# Patient Record
Sex: Female | Born: 1945 | Race: White | Hispanic: No | Smoking: Former smoker
Health system: Southern US, Community
[De-identification: ages and names within clinical notes are randomized; demographics above are authoritative.]

## PROBLEM LIST (undated history)

## (undated) DIAGNOSIS — K219 Gastro-esophageal reflux disease without esophagitis: Secondary | ICD-10-CM

## (undated) DIAGNOSIS — Z828 Family history of other disabilities and chronic diseases leading to disablement, not elsewhere classified: Secondary | ICD-10-CM

## (undated) DIAGNOSIS — Z8719 Personal history of other diseases of the digestive system: Secondary | ICD-10-CM

## (undated) DIAGNOSIS — C3490 Malignant neoplasm of unspecified part of unspecified bronchus or lung: Secondary | ICD-10-CM

## (undated) DIAGNOSIS — C3491 Malignant neoplasm of unspecified part of right bronchus or lung: Secondary | ICD-10-CM

## (undated) DIAGNOSIS — N393 Stress incontinence (female) (male): Secondary | ICD-10-CM

## (undated) DIAGNOSIS — Z9981 Dependence on supplemental oxygen: Secondary | ICD-10-CM

## (undated) DIAGNOSIS — I10 Essential (primary) hypertension: Secondary | ICD-10-CM

## (undated) DIAGNOSIS — K635 Polyp of colon: Secondary | ICD-10-CM

## (undated) DIAGNOSIS — J189 Pneumonia, unspecified organism: Secondary | ICD-10-CM

## (undated) DIAGNOSIS — I739 Peripheral vascular disease, unspecified: Secondary | ICD-10-CM

## (undated) DIAGNOSIS — J449 Chronic obstructive pulmonary disease, unspecified: Secondary | ICD-10-CM

## (undated) DIAGNOSIS — E785 Hyperlipidemia, unspecified: Secondary | ICD-10-CM

## (undated) DIAGNOSIS — E119 Type 2 diabetes mellitus without complications: Secondary | ICD-10-CM

## (undated) DIAGNOSIS — M797 Fibromyalgia: Secondary | ICD-10-CM

## (undated) DIAGNOSIS — J439 Emphysema, unspecified: Secondary | ICD-10-CM

## (undated) DIAGNOSIS — Z720 Tobacco use: Secondary | ICD-10-CM

## (undated) DIAGNOSIS — I2510 Atherosclerotic heart disease of native coronary artery without angina pectoris: Secondary | ICD-10-CM

## (undated) DIAGNOSIS — J42 Unspecified chronic bronchitis: Secondary | ICD-10-CM

## (undated) DIAGNOSIS — IMO0002 Reserved for concepts with insufficient information to code with codable children: Secondary | ICD-10-CM

## (undated) HISTORY — PX: CARPAL TUNNEL RELEASE: SHX101

## (undated) HISTORY — PX: ROTATOR CUFF REPAIR: SHX139

## (undated) HISTORY — PX: FEMUR FRACTURE SURGERY: SHX633

## (undated) HISTORY — DX: Hyperlipidemia, unspecified: E78.5

## (undated) HISTORY — DX: Tobacco use: Z72.0

## (undated) HISTORY — DX: Malignant neoplasm of unspecified part of right bronchus or lung: C34.91

## (undated) HISTORY — DX: Polyp of colon: K63.5

## (undated) HISTORY — DX: Malignant neoplasm of unspecified part of unspecified bronchus or lung: C34.90

## (undated) HISTORY — PX: JOINT REPLACEMENT: SHX530

## (undated) HISTORY — PX: ABDOMINAL HYSTERECTOMY: SHX81

## (undated) HISTORY — PX: PNEUMONECTOMY: SHX168

## (undated) HISTORY — PX: EYE SURGERY: SHX253

## (undated) HISTORY — DX: Essential (primary) hypertension: I10

## (undated) HISTORY — DX: Gastro-esophageal reflux disease without esophagitis: K21.9

## (undated) HISTORY — DX: Atherosclerotic heart disease of native coronary artery without angina pectoris: I25.10

---
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
## 1968-10-06 HISTORY — PX: APPENDECTOMY: SHX54

## 1970-10-06 HISTORY — PX: ABDOMINAL HYSTERECTOMY: SUR658

## 1987-10-07 HISTORY — PX: GALLBLADDER SURGERY: SHX652

## 1999-10-07 HISTORY — PX: REPLACEMENT TOTAL KNEE: SUR1224

## 2001-01-25 ENCOUNTER — Ambulatory Visit (HOSPITAL_COMMUNITY): Admission: RE | Admit: 2001-01-25 | Discharge: 2001-01-25 | Payer: Self-pay | Admitting: Pediatrics

## 2001-01-25 ENCOUNTER — Encounter: Payer: Self-pay | Admitting: General Surgery

## 2001-05-07 ENCOUNTER — Ambulatory Visit (HOSPITAL_COMMUNITY): Admission: RE | Admit: 2001-05-07 | Discharge: 2001-05-07 | Payer: Self-pay | Admitting: Cardiovascular Disease

## 2001-06-10 ENCOUNTER — Encounter: Payer: Self-pay | Admitting: Specialist

## 2001-06-17 ENCOUNTER — Inpatient Hospital Stay (HOSPITAL_COMMUNITY): Admission: RE | Admit: 2001-06-17 | Discharge: 2001-06-21 | Payer: Self-pay | Admitting: Specialist

## 2001-07-08 ENCOUNTER — Encounter (HOSPITAL_COMMUNITY): Admission: RE | Admit: 2001-07-08 | Discharge: 2001-08-07 | Payer: Self-pay | Admitting: Specialist

## 2001-08-09 ENCOUNTER — Encounter (HOSPITAL_COMMUNITY): Admission: RE | Admit: 2001-08-09 | Discharge: 2001-09-08 | Payer: Self-pay | Admitting: Specialist

## 2001-08-16 ENCOUNTER — Inpatient Hospital Stay (HOSPITAL_COMMUNITY): Admission: EM | Admit: 2001-08-16 | Discharge: 2001-08-18 | Payer: Self-pay | Admitting: Emergency Medicine

## 2001-08-17 ENCOUNTER — Encounter: Payer: Self-pay | Admitting: *Deleted

## 2001-12-06 ENCOUNTER — Observation Stay (HOSPITAL_COMMUNITY): Admission: RE | Admit: 2001-12-06 | Discharge: 2001-12-07 | Payer: Self-pay | Admitting: Specialist

## 2002-02-18 ENCOUNTER — Ambulatory Visit (HOSPITAL_COMMUNITY): Admission: RE | Admit: 2002-02-18 | Discharge: 2002-02-18 | Payer: Self-pay | Admitting: Neurosurgery

## 2002-02-18 ENCOUNTER — Encounter: Payer: Self-pay | Admitting: Neurosurgery

## 2002-05-10 ENCOUNTER — Encounter (HOSPITAL_COMMUNITY): Admission: RE | Admit: 2002-05-10 | Discharge: 2002-06-09 | Payer: Self-pay | Admitting: Neurosurgery

## 2002-06-24 ENCOUNTER — Encounter: Payer: Self-pay | Admitting: Neurosurgery

## 2002-06-27 ENCOUNTER — Encounter: Payer: Self-pay | Admitting: Neurosurgery

## 2002-06-27 ENCOUNTER — Ambulatory Visit (HOSPITAL_COMMUNITY): Admission: RE | Admit: 2002-06-27 | Discharge: 2002-06-28 | Payer: Self-pay | Admitting: Neurosurgery

## 2002-07-19 ENCOUNTER — Encounter: Admission: RE | Admit: 2002-07-19 | Discharge: 2002-07-19 | Payer: Self-pay | Admitting: Neurosurgery

## 2002-07-19 ENCOUNTER — Encounter: Payer: Self-pay | Admitting: Neurosurgery

## 2003-02-07 ENCOUNTER — Encounter: Payer: Self-pay | Admitting: Family Medicine

## 2003-02-07 ENCOUNTER — Ambulatory Visit (HOSPITAL_COMMUNITY): Admission: RE | Admit: 2003-02-07 | Discharge: 2003-02-07 | Payer: Self-pay | Admitting: Family Medicine

## 2003-04-05 ENCOUNTER — Ambulatory Visit (HOSPITAL_COMMUNITY): Admission: RE | Admit: 2003-04-05 | Discharge: 2003-04-05 | Payer: Self-pay | Admitting: Family Medicine

## 2003-04-05 ENCOUNTER — Encounter: Payer: Self-pay | Admitting: Family Medicine

## 2003-04-06 ENCOUNTER — Encounter: Payer: Self-pay | Admitting: Family Medicine

## 2003-04-06 ENCOUNTER — Ambulatory Visit (HOSPITAL_COMMUNITY): Admission: RE | Admit: 2003-04-06 | Discharge: 2003-04-06 | Payer: Self-pay | Admitting: Family Medicine

## 2003-04-11 ENCOUNTER — Ambulatory Visit (HOSPITAL_COMMUNITY): Admission: RE | Admit: 2003-04-11 | Discharge: 2003-04-11 | Payer: Self-pay | Admitting: Family Medicine

## 2003-04-11 ENCOUNTER — Encounter: Payer: Self-pay | Admitting: Family Medicine

## 2003-04-18 ENCOUNTER — Encounter (HOSPITAL_COMMUNITY): Admission: RE | Admit: 2003-04-18 | Discharge: 2003-05-18 | Payer: Self-pay | Admitting: Family Medicine

## 2003-05-30 ENCOUNTER — Encounter (HOSPITAL_COMMUNITY): Admission: RE | Admit: 2003-05-30 | Discharge: 2003-06-29 | Payer: Self-pay | Admitting: Family Medicine

## 2003-07-11 ENCOUNTER — Encounter: Payer: Self-pay | Admitting: Neurosurgery

## 2003-07-11 ENCOUNTER — Ambulatory Visit (HOSPITAL_COMMUNITY): Admission: RE | Admit: 2003-07-11 | Discharge: 2003-07-11 | Payer: Self-pay | Admitting: Neurosurgery

## 2003-07-17 ENCOUNTER — Encounter: Payer: Self-pay | Admitting: Neurosurgery

## 2003-07-19 ENCOUNTER — Encounter: Payer: Self-pay | Admitting: Neurosurgery

## 2003-07-19 ENCOUNTER — Inpatient Hospital Stay (HOSPITAL_COMMUNITY): Admission: RE | Admit: 2003-07-19 | Discharge: 2003-07-22 | Payer: Self-pay | Admitting: *Deleted

## 2003-10-07 HISTORY — PX: CORONARY ANGIOPLASTY: SHX604

## 2003-10-07 HISTORY — PX: NECK SURGERY: SHX720

## 2003-10-19 ENCOUNTER — Encounter: Admission: RE | Admit: 2003-10-19 | Discharge: 2003-10-19 | Payer: Self-pay | Admitting: Neurosurgery

## 2003-10-19 ENCOUNTER — Inpatient Hospital Stay (HOSPITAL_COMMUNITY): Admission: EM | Admit: 2003-10-19 | Discharge: 2003-10-20 | Payer: Self-pay | Admitting: Emergency Medicine

## 2003-10-23 ENCOUNTER — Inpatient Hospital Stay (HOSPITAL_COMMUNITY): Admission: EM | Admit: 2003-10-23 | Discharge: 2003-10-25 | Payer: Self-pay | Admitting: *Deleted

## 2003-11-24 ENCOUNTER — Encounter: Admission: RE | Admit: 2003-11-24 | Discharge: 2003-11-24 | Payer: Self-pay | Admitting: Neurosurgery

## 2004-06-13 ENCOUNTER — Ambulatory Visit (HOSPITAL_COMMUNITY): Admission: RE | Admit: 2004-06-13 | Discharge: 2004-06-13 | Payer: Self-pay | Admitting: Family Medicine

## 2004-07-22 ENCOUNTER — Ambulatory Visit (HOSPITAL_COMMUNITY): Admission: RE | Admit: 2004-07-22 | Discharge: 2004-07-22 | Payer: Self-pay | Admitting: *Deleted

## 2004-08-08 ENCOUNTER — Ambulatory Visit (HOSPITAL_COMMUNITY): Admission: RE | Admit: 2004-08-08 | Discharge: 2004-08-09 | Payer: Self-pay | Admitting: *Deleted

## 2004-09-08 ENCOUNTER — Emergency Department (HOSPITAL_COMMUNITY): Admission: EM | Admit: 2004-09-08 | Discharge: 2004-09-08 | Payer: Self-pay | Admitting: Emergency Medicine

## 2004-09-09 ENCOUNTER — Inpatient Hospital Stay (HOSPITAL_COMMUNITY): Admission: AD | Admit: 2004-09-09 | Discharge: 2004-09-09 | Payer: Self-pay | Admitting: Cardiovascular Disease

## 2004-11-04 ENCOUNTER — Emergency Department (HOSPITAL_COMMUNITY): Admission: EM | Admit: 2004-11-04 | Discharge: 2004-11-04 | Payer: Self-pay | Admitting: Emergency Medicine

## 2004-11-13 ENCOUNTER — Ambulatory Visit (HOSPITAL_COMMUNITY): Admission: RE | Admit: 2004-11-13 | Discharge: 2004-11-13 | Payer: Self-pay | Admitting: Family Medicine

## 2005-10-06 HISTORY — PX: BACK SURGERY: SHX140

## 2005-10-28 ENCOUNTER — Ambulatory Visit (HOSPITAL_COMMUNITY): Admission: RE | Admit: 2005-10-28 | Discharge: 2005-10-28 | Payer: Self-pay | Admitting: General Surgery

## 2005-11-26 ENCOUNTER — Ambulatory Visit: Payer: Self-pay | Admitting: Internal Medicine

## 2005-12-03 ENCOUNTER — Ambulatory Visit: Payer: Self-pay | Admitting: Internal Medicine

## 2005-12-03 ENCOUNTER — Encounter: Payer: Self-pay | Admitting: Internal Medicine

## 2005-12-03 ENCOUNTER — Encounter: Payer: Self-pay | Admitting: Gastroenterology

## 2005-12-03 ENCOUNTER — Ambulatory Visit (HOSPITAL_COMMUNITY): Admission: RE | Admit: 2005-12-03 | Discharge: 2005-12-03 | Payer: Self-pay | Admitting: Internal Medicine

## 2006-01-13 ENCOUNTER — Ambulatory Visit: Payer: Self-pay | Admitting: Internal Medicine

## 2006-01-14 ENCOUNTER — Ambulatory Visit (HOSPITAL_COMMUNITY): Admission: RE | Admit: 2006-01-14 | Discharge: 2006-01-14 | Payer: Self-pay | Admitting: Internal Medicine

## 2006-11-10 ENCOUNTER — Ambulatory Visit (HOSPITAL_COMMUNITY): Admission: RE | Admit: 2006-11-10 | Discharge: 2006-11-10 | Payer: Self-pay | Admitting: Family Medicine

## 2006-11-17 ENCOUNTER — Ambulatory Visit (HOSPITAL_COMMUNITY): Admission: RE | Admit: 2006-11-17 | Discharge: 2006-11-17 | Payer: Self-pay | Admitting: Urology

## 2006-11-18 ENCOUNTER — Ambulatory Visit (HOSPITAL_COMMUNITY): Admission: RE | Admit: 2006-11-18 | Discharge: 2006-11-18 | Payer: Self-pay | Admitting: Family Medicine

## 2007-03-01 ENCOUNTER — Inpatient Hospital Stay (HOSPITAL_COMMUNITY): Admission: EM | Admit: 2007-03-01 | Discharge: 2007-03-02 | Payer: Self-pay | Admitting: *Deleted

## 2007-04-15 ENCOUNTER — Encounter: Payer: Self-pay | Admitting: Endocrinology

## 2007-11-12 ENCOUNTER — Ambulatory Visit: Payer: Self-pay | Admitting: Cardiology

## 2007-11-22 ENCOUNTER — Ambulatory Visit (HOSPITAL_COMMUNITY): Admission: RE | Admit: 2007-11-22 | Discharge: 2007-11-22 | Payer: Self-pay | Admitting: Family Medicine

## 2007-12-12 HISTORY — PX: PORTACATH PLACEMENT: SHX2246

## 2008-04-02 ENCOUNTER — Emergency Department (HOSPITAL_COMMUNITY): Admission: EM | Admit: 2008-04-02 | Discharge: 2008-04-02 | Payer: Self-pay | Admitting: Emergency Medicine

## 2008-04-07 ENCOUNTER — Emergency Department (HOSPITAL_COMMUNITY): Admission: EM | Admit: 2008-04-07 | Discharge: 2008-04-07 | Payer: Self-pay | Admitting: Emergency Medicine

## 2008-05-25 ENCOUNTER — Ambulatory Visit: Payer: Self-pay | Admitting: Cardiology

## 2008-06-08 ENCOUNTER — Encounter: Payer: Self-pay | Admitting: Cardiology

## 2008-06-08 ENCOUNTER — Ambulatory Visit: Payer: Self-pay

## 2008-07-09 ENCOUNTER — Encounter: Payer: Self-pay | Admitting: Emergency Medicine

## 2008-07-09 ENCOUNTER — Ambulatory Visit: Payer: Self-pay | Admitting: Pulmonary Disease

## 2008-07-09 ENCOUNTER — Ambulatory Visit: Payer: Self-pay | Admitting: Cardiovascular Disease

## 2008-07-09 ENCOUNTER — Inpatient Hospital Stay (HOSPITAL_COMMUNITY): Admission: AD | Admit: 2008-07-09 | Discharge: 2008-07-14 | Payer: Self-pay | Admitting: Cardiology

## 2008-07-11 ENCOUNTER — Encounter (INDEPENDENT_AMBULATORY_CARE_PROVIDER_SITE_OTHER): Payer: Self-pay | Admitting: Pulmonary Disease

## 2008-07-12 ENCOUNTER — Ambulatory Visit: Payer: Self-pay | Admitting: Gastroenterology

## 2008-07-13 ENCOUNTER — Encounter: Payer: Self-pay | Admitting: Gastroenterology

## 2008-07-24 ENCOUNTER — Encounter (INDEPENDENT_AMBULATORY_CARE_PROVIDER_SITE_OTHER): Payer: Self-pay | Admitting: Interventional Radiology

## 2008-07-24 ENCOUNTER — Ambulatory Visit (HOSPITAL_COMMUNITY): Admission: RE | Admit: 2008-07-24 | Discharge: 2008-07-24 | Payer: Self-pay | Admitting: Pulmonary Disease

## 2008-07-28 ENCOUNTER — Ambulatory Visit: Payer: Self-pay | Admitting: Pulmonary Disease

## 2008-07-28 DIAGNOSIS — I10 Essential (primary) hypertension: Secondary | ICD-10-CM

## 2008-07-28 DIAGNOSIS — Z9981 Dependence on supplemental oxygen: Secondary | ICD-10-CM

## 2008-07-30 DIAGNOSIS — E785 Hyperlipidemia, unspecified: Secondary | ICD-10-CM

## 2008-07-30 DIAGNOSIS — E119 Type 2 diabetes mellitus without complications: Secondary | ICD-10-CM | POA: Insufficient documentation

## 2008-08-07 ENCOUNTER — Ambulatory Visit: Payer: Self-pay | Admitting: Endocrinology

## 2008-08-21 ENCOUNTER — Ambulatory Visit: Payer: Self-pay | Admitting: Endocrinology

## 2008-08-25 ENCOUNTER — Ambulatory Visit (HOSPITAL_COMMUNITY): Admission: RE | Admit: 2008-08-25 | Discharge: 2008-08-25 | Payer: Self-pay | Admitting: Pulmonary Disease

## 2008-08-29 ENCOUNTER — Ambulatory Visit: Payer: Self-pay | Admitting: Pulmonary Disease

## 2008-09-04 ENCOUNTER — Ambulatory Visit (HOSPITAL_COMMUNITY): Admission: RE | Admit: 2008-09-04 | Discharge: 2008-09-04 | Payer: Self-pay | Admitting: Pulmonary Disease

## 2008-09-07 ENCOUNTER — Ambulatory Visit: Payer: Self-pay | Admitting: Thoracic Surgery

## 2008-09-21 ENCOUNTER — Ambulatory Visit (HOSPITAL_COMMUNITY): Admission: RE | Admit: 2008-09-21 | Discharge: 2008-09-21 | Payer: Self-pay | Admitting: Otolaryngology

## 2008-09-25 ENCOUNTER — Ambulatory Visit: Payer: Self-pay | Admitting: Pulmonary Disease

## 2008-10-06 DIAGNOSIS — C3490 Malignant neoplasm of unspecified part of unspecified bronchus or lung: Secondary | ICD-10-CM

## 2008-10-06 DIAGNOSIS — Z8489 Family history of other specified conditions: Secondary | ICD-10-CM

## 2008-10-06 HISTORY — DX: Family history of other specified conditions: Z84.89

## 2008-10-06 HISTORY — DX: Malignant neoplasm of unspecified part of unspecified bronchus or lung: C34.90

## 2008-10-12 ENCOUNTER — Ambulatory Visit: Payer: Self-pay | Admitting: Pulmonary Disease

## 2008-10-12 ENCOUNTER — Encounter (INDEPENDENT_AMBULATORY_CARE_PROVIDER_SITE_OTHER): Payer: Self-pay | Admitting: Otolaryngology

## 2008-10-12 ENCOUNTER — Ambulatory Visit: Payer: Self-pay | Admitting: Thoracic Surgery

## 2008-10-12 ENCOUNTER — Inpatient Hospital Stay (HOSPITAL_COMMUNITY): Admission: RE | Admit: 2008-10-12 | Discharge: 2008-10-19 | Payer: Self-pay | Admitting: Thoracic Surgery

## 2008-10-12 HISTORY — PX: OTHER SURGICAL HISTORY: SHX169

## 2008-10-25 ENCOUNTER — Ambulatory Visit: Payer: Self-pay | Admitting: Thoracic Surgery

## 2008-10-25 ENCOUNTER — Encounter: Admission: RE | Admit: 2008-10-25 | Discharge: 2008-10-25 | Payer: Self-pay | Admitting: Thoracic Surgery

## 2008-10-26 ENCOUNTER — Ambulatory Visit: Payer: Self-pay | Admitting: Internal Medicine

## 2008-11-08 ENCOUNTER — Ambulatory Visit: Payer: Self-pay | Admitting: Thoracic Surgery

## 2008-11-08 ENCOUNTER — Encounter: Admission: RE | Admit: 2008-11-08 | Discharge: 2008-11-08 | Payer: Self-pay | Admitting: Thoracic Surgery

## 2008-11-08 ENCOUNTER — Encounter: Payer: Self-pay | Admitting: Cardiology

## 2008-11-08 LAB — CBC WITH DIFFERENTIAL/PLATELET
Basophils Absolute: 0 10*3/uL (ref 0.0–0.1)
Eosinophils Absolute: 0.4 10*3/uL (ref 0.0–0.5)
HGB: 12.3 g/dL (ref 11.6–15.9)
LYMPH%: 34.8 % (ref 14.0–48.0)
MCV: 81.1 fL (ref 81.0–101.0)
MONO%: 7.6 % (ref 0.0–13.0)
NEUT#: 2.6 10*3/uL (ref 1.5–6.5)
NEUT%: 48.6 % (ref 39.6–76.8)
Platelets: 142 10*3/uL — ABNORMAL LOW (ref 145–400)

## 2008-11-08 LAB — COMPREHENSIVE METABOLIC PANEL
Albumin: 4.1 g/dL (ref 3.5–5.2)
Alkaline Phosphatase: 87 U/L (ref 39–117)
BUN: 11 mg/dL (ref 6–23)
Glucose, Bld: 151 mg/dL — ABNORMAL HIGH (ref 70–99)
Total Bilirubin: 0.4 mg/dL (ref 0.3–1.2)

## 2008-11-09 ENCOUNTER — Ambulatory Visit: Admission: RE | Admit: 2008-11-09 | Discharge: 2008-12-01 | Payer: Self-pay | Admitting: Radiation Oncology

## 2008-11-15 ENCOUNTER — Ambulatory Visit (HOSPITAL_COMMUNITY): Admission: RE | Admit: 2008-11-15 | Discharge: 2008-11-15 | Payer: Self-pay | Admitting: Internal Medicine

## 2008-11-16 ENCOUNTER — Ambulatory Visit: Payer: Self-pay | Admitting: Internal Medicine

## 2008-11-21 ENCOUNTER — Encounter: Payer: Self-pay | Admitting: Cardiology

## 2008-11-21 LAB — COMPREHENSIVE METABOLIC PANEL
Albumin: 4.3 g/dL (ref 3.5–5.2)
CO2: 19 mEq/L (ref 19–32)
Calcium: 9.3 mg/dL (ref 8.4–10.5)
Chloride: 106 mEq/L (ref 96–112)
Glucose, Bld: 337 mg/dL — ABNORMAL HIGH (ref 70–99)
Sodium: 139 mEq/L (ref 135–145)
Total Bilirubin: 0.3 mg/dL (ref 0.3–1.2)
Total Protein: 7.3 g/dL (ref 6.0–8.3)

## 2008-11-21 LAB — CBC WITH DIFFERENTIAL/PLATELET
Basophils Absolute: 0 10*3/uL (ref 0.0–0.1)
Eosinophils Absolute: 0 10*3/uL (ref 0.0–0.5)
HGB: 13 g/dL (ref 11.6–15.9)
MCV: 80.4 fL — ABNORMAL LOW (ref 81.0–101.0)
MONO%: 2.3 % (ref 0.0–13.0)
NEUT#: 11.7 10*3/uL — ABNORMAL HIGH (ref 1.5–6.5)
RDW: 14.4 % (ref 11.3–14.5)

## 2008-11-21 LAB — MAGNESIUM: Magnesium: 1.9 mg/dL (ref 1.5–2.5)

## 2008-11-30 ENCOUNTER — Ambulatory Visit: Payer: Self-pay | Admitting: Cardiology

## 2008-12-01 ENCOUNTER — Telehealth: Payer: Self-pay | Admitting: Gastroenterology

## 2008-12-05 ENCOUNTER — Ambulatory Visit: Payer: Self-pay | Admitting: Internal Medicine

## 2008-12-05 LAB — CBC WITH DIFFERENTIAL/PLATELET
Basophils Absolute: 0 10*3/uL (ref 0.0–0.1)
EOS%: 0.7 % (ref 0.0–7.0)
HCT: 35.3 % (ref 34.8–46.6)
HGB: 11.8 g/dL (ref 11.6–15.9)
MCH: 26.5 pg (ref 25.1–34.0)
NEUT%: 71 % (ref 38.4–76.8)
lymph#: 1.8 10*3/uL (ref 0.9–3.3)

## 2008-12-05 LAB — COMPREHENSIVE METABOLIC PANEL
ALT: 11 U/L (ref 0–35)
Albumin: 3.9 g/dL (ref 3.5–5.2)
CO2: 25 mEq/L (ref 19–32)
Glucose, Bld: 263 mg/dL — ABNORMAL HIGH (ref 70–99)
Potassium: 4 mEq/L (ref 3.5–5.3)
Sodium: 144 mEq/L (ref 135–145)
Total Protein: 6.2 g/dL (ref 6.0–8.3)

## 2008-12-05 LAB — MAGNESIUM: Magnesium: 1.4 mg/dL — ABNORMAL LOW (ref 1.5–2.5)

## 2008-12-11 ENCOUNTER — Ambulatory Visit: Payer: Self-pay | Admitting: Thoracic Surgery

## 2008-12-11 ENCOUNTER — Ambulatory Visit (HOSPITAL_COMMUNITY): Admission: RE | Admit: 2008-12-11 | Discharge: 2008-12-11 | Payer: Self-pay | Admitting: Thoracic Surgery

## 2008-12-11 LAB — CBC WITH DIFFERENTIAL/PLATELET
BASO%: 0.3 % (ref 0.0–2.0)
EOS%: 1.3 % (ref 0.0–7.0)
LYMPH%: 18.8 % (ref 14.0–49.7)
MCH: 27.7 pg (ref 25.1–34.0)
MCHC: 33.8 g/dL (ref 31.5–36.0)
MONO#: 0.8 10*3/uL (ref 0.1–0.9)
NEUT%: 71.9 % (ref 38.4–76.8)
Platelets: 187 10*3/uL (ref 145–400)
RBC: 4.37 10*6/uL (ref 3.70–5.45)
WBC: 10 10*3/uL (ref 3.9–10.3)
lymph#: 1.9 10*3/uL (ref 0.9–3.3)

## 2008-12-11 LAB — COMPREHENSIVE METABOLIC PANEL
ALT: 15 U/L (ref 0–35)
AST: 16 U/L (ref 0–37)
Alkaline Phosphatase: 73 U/L (ref 39–117)
CO2: 28 mEq/L (ref 19–32)
Creatinine, Ser: 0.66 mg/dL (ref 0.40–1.20)
Sodium: 139 mEq/L (ref 135–145)
Total Bilirubin: 0.9 mg/dL (ref 0.3–1.2)
Total Protein: 6.1 g/dL (ref 6.0–8.3)

## 2008-12-11 LAB — MAGNESIUM: Magnesium: 2.1 mg/dL (ref 1.5–2.5)

## 2008-12-16 ENCOUNTER — Emergency Department (HOSPITAL_COMMUNITY): Admission: EM | Admit: 2008-12-16 | Discharge: 2008-12-17 | Payer: Self-pay | Admitting: Emergency Medicine

## 2008-12-20 ENCOUNTER — Ambulatory Visit: Payer: Self-pay | Admitting: Thoracic Surgery

## 2008-12-20 ENCOUNTER — Encounter: Admission: RE | Admit: 2008-12-20 | Discharge: 2008-12-20 | Payer: Self-pay | Admitting: Thoracic Surgery

## 2008-12-26 ENCOUNTER — Ambulatory Visit: Payer: Self-pay | Admitting: Gastroenterology

## 2009-01-01 LAB — COMPREHENSIVE METABOLIC PANEL
AST: 12 U/L (ref 0–37)
Alkaline Phosphatase: 87 U/L (ref 39–117)
BUN: 13 mg/dL (ref 6–23)
Creatinine, Ser: 0.77 mg/dL (ref 0.40–1.20)
Potassium: 4.6 mEq/L (ref 3.5–5.3)

## 2009-01-01 LAB — CBC WITH DIFFERENTIAL/PLATELET
BASO%: 0.6 % (ref 0.0–2.0)
Basophils Absolute: 0 10*3/uL (ref 0.0–0.1)
EOS%: 1.1 % (ref 0.0–7.0)
HCT: 33.7 % — ABNORMAL LOW (ref 34.8–46.6)
HGB: 11.3 g/dL — ABNORMAL LOW (ref 11.6–15.9)
LYMPH%: 26.3 % (ref 14.0–49.7)
MCH: 28.2 pg (ref 25.1–34.0)
MCHC: 33.5 g/dL (ref 31.5–36.0)
MCV: 84 fL (ref 79.5–101.0)
MONO%: 7.9 % (ref 0.0–14.0)
NEUT%: 64.1 % (ref 38.4–76.8)
Platelets: 160 10*3/uL (ref 145–400)
lymph#: 1.8 10*3/uL (ref 0.9–3.3)

## 2009-01-09 LAB — CBC WITH DIFFERENTIAL/PLATELET
Basophils Absolute: 0.1 10*3/uL (ref 0.0–0.1)
Eosinophils Absolute: 0.2 10*3/uL (ref 0.0–0.5)
HCT: 33.8 % — ABNORMAL LOW (ref 34.8–46.6)
HGB: 11.4 g/dL — ABNORMAL LOW (ref 11.6–15.9)
LYMPH%: 25.9 % (ref 14.0–49.7)
MCV: 83.3 fL (ref 79.5–101.0)
MONO#: 0.7 10*3/uL (ref 0.1–0.9)
NEUT#: 5.6 10*3/uL (ref 1.5–6.5)
NEUT%: 62.5 % (ref 38.4–76.8)
Platelets: 122 10*3/uL — ABNORMAL LOW (ref 145–400)
RBC: 4.05 10*6/uL (ref 3.70–5.45)
WBC: 8.9 10*3/uL (ref 3.9–10.3)

## 2009-01-09 LAB — COMPREHENSIVE METABOLIC PANEL
BUN: 11 mg/dL (ref 6–23)
CO2: 26 mEq/L (ref 19–32)
Glucose, Bld: 166 mg/dL — ABNORMAL HIGH (ref 70–99)
Sodium: 139 mEq/L (ref 135–145)
Total Bilirubin: 0.4 mg/dL (ref 0.3–1.2)
Total Protein: 6.2 g/dL (ref 6.0–8.3)

## 2009-01-18 ENCOUNTER — Ambulatory Visit: Payer: Self-pay | Admitting: Internal Medicine

## 2009-01-22 LAB — COMPREHENSIVE METABOLIC PANEL
ALT: 10 U/L (ref 0–35)
AST: 11 U/L (ref 0–37)
CO2: 23 mEq/L (ref 19–32)
Creatinine, Ser: 0.83 mg/dL (ref 0.40–1.20)
Total Bilirubin: 0.4 mg/dL (ref 0.3–1.2)

## 2009-01-22 LAB — CBC WITH DIFFERENTIAL/PLATELET
BASO%: 0.3 % (ref 0.0–2.0)
EOS%: 0.9 % (ref 0.0–7.0)
HCT: 33.8 % — ABNORMAL LOW (ref 34.8–46.6)
LYMPH%: 28 % (ref 14.0–49.7)
MCH: 28.7 pg (ref 25.1–34.0)
MCHC: 34.1 g/dL (ref 31.5–36.0)
MCV: 84.3 fL (ref 79.5–101.0)
NEUT%: 62.8 % (ref 38.4–76.8)
Platelets: 137 10*3/uL — ABNORMAL LOW (ref 145–400)

## 2009-01-22 LAB — MAGNESIUM: Magnesium: 1.8 mg/dL (ref 1.5–2.5)

## 2009-02-12 ENCOUNTER — Ambulatory Visit (HOSPITAL_COMMUNITY): Admission: RE | Admit: 2009-02-12 | Discharge: 2009-02-12 | Payer: Self-pay | Admitting: Internal Medicine

## 2009-02-14 ENCOUNTER — Ambulatory Visit: Admission: RE | Admit: 2009-02-14 | Discharge: 2009-02-14 | Payer: Self-pay | Admitting: Internal Medicine

## 2009-02-14 ENCOUNTER — Encounter: Payer: Self-pay | Admitting: Cardiology

## 2009-02-14 ENCOUNTER — Encounter: Payer: Self-pay | Admitting: Internal Medicine

## 2009-02-14 ENCOUNTER — Ambulatory Visit: Payer: Self-pay | Admitting: Vascular Surgery

## 2009-02-14 LAB — COMPREHENSIVE METABOLIC PANEL
AST: 14 U/L (ref 0–37)
Alkaline Phosphatase: 85 U/L (ref 39–117)
BUN: 14 mg/dL (ref 6–23)
Creatinine, Ser: 0.68 mg/dL (ref 0.40–1.20)
Glucose, Bld: 207 mg/dL — ABNORMAL HIGH (ref 70–99)
Total Bilirubin: 0.4 mg/dL (ref 0.3–1.2)

## 2009-02-14 LAB — CBC WITH DIFFERENTIAL/PLATELET
Basophils Absolute: 0 10*3/uL (ref 0.0–0.1)
EOS%: 1.5 % (ref 0.0–7.0)
Eosinophils Absolute: 0.1 10*3/uL (ref 0.0–0.5)
HGB: 11.6 g/dL (ref 11.6–15.9)
LYMPH%: 27.6 % (ref 14.0–49.7)
MCH: 28.8 pg (ref 25.1–34.0)
MCV: 86 fL (ref 79.5–101.0)
MONO%: 6 % (ref 0.0–14.0)
NEUT#: 4.1 10*3/uL (ref 1.5–6.5)
Platelets: 147 10*3/uL (ref 145–400)
RDW: 18.3 % — ABNORMAL HIGH (ref 11.2–14.5)

## 2009-02-15 ENCOUNTER — Ambulatory Visit: Admission: RE | Admit: 2009-02-15 | Discharge: 2009-04-29 | Payer: Self-pay | Admitting: Radiation Oncology

## 2009-02-28 ENCOUNTER — Encounter: Admission: RE | Admit: 2009-02-28 | Discharge: 2009-02-28 | Payer: Self-pay | Admitting: Thoracic Surgery

## 2009-02-28 ENCOUNTER — Ambulatory Visit: Payer: Self-pay | Admitting: Thoracic Surgery

## 2009-03-06 DIAGNOSIS — IMO0001 Reserved for inherently not codable concepts without codable children: Secondary | ICD-10-CM

## 2009-03-06 HISTORY — DX: Reserved for inherently not codable concepts without codable children: IMO0001

## 2009-03-28 ENCOUNTER — Ambulatory Visit (HOSPITAL_COMMUNITY): Payer: Self-pay | Admitting: Family Medicine

## 2009-03-28 ENCOUNTER — Encounter (HOSPITAL_COMMUNITY): Admission: RE | Admit: 2009-03-28 | Discharge: 2009-04-27 | Payer: Self-pay | Admitting: Oncology

## 2009-04-26 ENCOUNTER — Encounter (INDEPENDENT_AMBULATORY_CARE_PROVIDER_SITE_OTHER): Payer: Self-pay | Admitting: *Deleted

## 2009-05-02 ENCOUNTER — Ambulatory Visit: Payer: Self-pay | Admitting: Internal Medicine

## 2009-05-07 ENCOUNTER — Ambulatory Visit (HOSPITAL_COMMUNITY): Admission: RE | Admit: 2009-05-07 | Discharge: 2009-05-07 | Payer: Self-pay | Admitting: Internal Medicine

## 2009-05-07 LAB — COMPREHENSIVE METABOLIC PANEL
AST: 23 U/L (ref 0–37)
Albumin: 3.8 g/dL (ref 3.5–5.2)
Alkaline Phosphatase: 79 U/L (ref 39–117)
Glucose, Bld: 186 mg/dL — ABNORMAL HIGH (ref 70–99)
Potassium: 4 mEq/L (ref 3.5–5.3)
Sodium: 138 mEq/L (ref 135–145)
Total Bilirubin: 0.6 mg/dL (ref 0.3–1.2)
Total Protein: 7.5 g/dL (ref 6.0–8.3)

## 2009-05-07 LAB — CBC WITH DIFFERENTIAL/PLATELET
Basophils Absolute: 0 10*3/uL (ref 0.0–0.1)
Eosinophils Absolute: 0.4 10*3/uL (ref 0.0–0.5)
HGB: 11.5 g/dL — ABNORMAL LOW (ref 11.6–15.9)
MCV: 82.4 fL (ref 79.5–101.0)
MONO#: 0.5 10*3/uL (ref 0.1–0.9)
NEUT#: 2.7 10*3/uL (ref 1.5–6.5)
RBC: 4.15 10*6/uL (ref 3.70–5.45)
RDW: 14.3 % (ref 11.2–14.5)
WBC: 4.5 10*3/uL (ref 3.9–10.3)
lymph#: 0.9 10*3/uL (ref 0.9–3.3)

## 2009-05-16 ENCOUNTER — Encounter: Payer: Self-pay | Admitting: Cardiology

## 2009-05-21 ENCOUNTER — Ambulatory Visit (HOSPITAL_COMMUNITY): Admission: RE | Admit: 2009-05-21 | Discharge: 2009-05-21 | Payer: Self-pay | Admitting: Internal Medicine

## 2009-06-06 ENCOUNTER — Ambulatory Visit (HOSPITAL_COMMUNITY): Payer: Self-pay | Admitting: Family Medicine

## 2009-06-06 ENCOUNTER — Encounter (HOSPITAL_COMMUNITY): Admission: RE | Admit: 2009-06-06 | Discharge: 2009-07-04 | Payer: Self-pay | Admitting: Family Medicine

## 2009-07-12 ENCOUNTER — Ambulatory Visit: Payer: Self-pay | Admitting: Cardiology

## 2009-07-18 ENCOUNTER — Encounter (HOSPITAL_COMMUNITY): Admission: RE | Admit: 2009-07-18 | Discharge: 2009-08-17 | Payer: Self-pay | Admitting: Family Medicine

## 2009-07-26 ENCOUNTER — Telehealth (INDEPENDENT_AMBULATORY_CARE_PROVIDER_SITE_OTHER): Payer: Self-pay

## 2009-07-30 ENCOUNTER — Ambulatory Visit (HOSPITAL_COMMUNITY): Admission: RE | Admit: 2009-07-30 | Discharge: 2009-07-30 | Payer: Self-pay | Admitting: Family Medicine

## 2009-08-01 ENCOUNTER — Ambulatory Visit (HOSPITAL_COMMUNITY): Admission: RE | Admit: 2009-08-01 | Discharge: 2009-08-01 | Payer: Self-pay | Admitting: Family Medicine

## 2009-08-01 ENCOUNTER — Encounter: Payer: Self-pay | Admitting: Pulmonary Disease

## 2009-08-07 ENCOUNTER — Ambulatory Visit (HOSPITAL_COMMUNITY): Admission: RE | Admit: 2009-08-07 | Discharge: 2009-08-07 | Payer: Self-pay | Admitting: Family Medicine

## 2009-08-14 ENCOUNTER — Ambulatory Visit: Payer: Self-pay | Admitting: Internal Medicine

## 2009-08-16 ENCOUNTER — Encounter: Payer: Self-pay | Admitting: Cardiology

## 2009-08-16 ENCOUNTER — Encounter: Payer: Self-pay | Admitting: Pulmonary Disease

## 2009-08-16 LAB — CBC WITH DIFFERENTIAL/PLATELET
Basophils Absolute: 0 10*3/uL (ref 0.0–0.1)
Eosinophils Absolute: 0.2 10*3/uL (ref 0.0–0.5)
HGB: 13.4 g/dL (ref 11.6–15.9)
MCV: 78.3 fL — ABNORMAL LOW (ref 79.5–101.0)
MONO#: 0.3 10*3/uL (ref 0.1–0.9)
MONO%: 7.6 % (ref 0.0–14.0)
NEUT#: 2.7 10*3/uL (ref 1.5–6.5)
Platelets: 158 10*3/uL (ref 145–400)
RDW: 14.8 % — ABNORMAL HIGH (ref 11.2–14.5)

## 2009-08-16 LAB — COMPREHENSIVE METABOLIC PANEL
Albumin: 3.7 g/dL (ref 3.5–5.2)
Alkaline Phosphatase: 83 U/L (ref 39–117)
BUN: 8 mg/dL (ref 6–23)
CO2: 26 mEq/L (ref 19–32)
Calcium: 9.1 mg/dL (ref 8.4–10.5)
Glucose, Bld: 218 mg/dL — ABNORMAL HIGH (ref 70–99)
Potassium: 4 mEq/L (ref 3.5–5.3)

## 2009-08-24 ENCOUNTER — Encounter (INDEPENDENT_AMBULATORY_CARE_PROVIDER_SITE_OTHER): Payer: Self-pay | Admitting: *Deleted

## 2009-08-28 ENCOUNTER — Ambulatory Visit: Payer: Self-pay | Admitting: Pulmonary Disease

## 2009-08-28 DIAGNOSIS — J700 Acute pulmonary manifestations due to radiation: Secondary | ICD-10-CM

## 2009-08-28 DIAGNOSIS — C3491 Malignant neoplasm of unspecified part of right bronchus or lung: Secondary | ICD-10-CM

## 2009-08-28 DIAGNOSIS — J441 Chronic obstructive pulmonary disease with (acute) exacerbation: Secondary | ICD-10-CM

## 2009-08-28 HISTORY — DX: Malignant neoplasm of unspecified part of right bronchus or lung: C34.91

## 2009-08-29 ENCOUNTER — Ambulatory Visit (HOSPITAL_COMMUNITY): Payer: Self-pay | Admitting: Oncology

## 2009-08-29 ENCOUNTER — Encounter (HOSPITAL_COMMUNITY): Admission: RE | Admit: 2009-08-29 | Discharge: 2009-09-28 | Payer: Self-pay | Admitting: Oncology

## 2009-09-19 ENCOUNTER — Ambulatory Visit: Payer: Self-pay | Admitting: Pulmonary Disease

## 2009-09-24 ENCOUNTER — Ambulatory Visit: Payer: Self-pay | Admitting: Gastroenterology

## 2009-10-02 ENCOUNTER — Encounter: Payer: Self-pay | Admitting: Urgent Care

## 2009-10-08 ENCOUNTER — Encounter: Payer: Self-pay | Admitting: Gastroenterology

## 2009-10-08 ENCOUNTER — Telehealth: Payer: Self-pay | Admitting: Gastroenterology

## 2009-10-08 ENCOUNTER — Telehealth (INDEPENDENT_AMBULATORY_CARE_PROVIDER_SITE_OTHER): Payer: Self-pay | Admitting: *Deleted

## 2009-10-09 ENCOUNTER — Encounter: Payer: Self-pay | Admitting: Pulmonary Disease

## 2009-10-09 ENCOUNTER — Encounter: Admission: RE | Admit: 2009-10-09 | Discharge: 2009-10-09 | Payer: Self-pay | Admitting: Thoracic Surgery

## 2009-10-09 ENCOUNTER — Ambulatory Visit: Payer: Self-pay | Admitting: Thoracic Surgery

## 2009-10-10 ENCOUNTER — Encounter (HOSPITAL_COMMUNITY): Admission: RE | Admit: 2009-10-10 | Discharge: 2009-11-09 | Payer: Self-pay | Admitting: Family Medicine

## 2009-10-10 ENCOUNTER — Ambulatory Visit (HOSPITAL_COMMUNITY): Payer: Self-pay | Admitting: Family Medicine

## 2009-10-19 ENCOUNTER — Ambulatory Visit: Payer: Self-pay | Admitting: Pulmonary Disease

## 2009-10-19 DIAGNOSIS — R05 Cough: Secondary | ICD-10-CM

## 2009-11-12 ENCOUNTER — Ambulatory Visit: Payer: Self-pay | Admitting: Internal Medicine

## 2009-11-14 ENCOUNTER — Ambulatory Visit (HOSPITAL_COMMUNITY): Admission: RE | Admit: 2009-11-14 | Discharge: 2009-11-14 | Payer: Self-pay | Admitting: Internal Medicine

## 2009-11-14 LAB — COMPREHENSIVE METABOLIC PANEL
AST: 16 U/L (ref 0–37)
Albumin: 3.9 g/dL (ref 3.5–5.2)
Alkaline Phosphatase: 86 U/L (ref 39–117)
BUN: 9 mg/dL (ref 6–23)
Calcium: 9.1 mg/dL (ref 8.4–10.5)
Chloride: 107 mEq/L (ref 96–112)
Creatinine, Ser: 0.77 mg/dL (ref 0.40–1.20)
Glucose, Bld: 161 mg/dL — ABNORMAL HIGH (ref 70–99)
Potassium: 4.6 mEq/L (ref 3.5–5.3)

## 2009-11-14 LAB — CBC WITH DIFFERENTIAL/PLATELET
Basophils Absolute: 0 10*3/uL (ref 0.0–0.1)
EOS%: 5.1 % (ref 0.0–7.0)
Eosinophils Absolute: 0.3 10*3/uL (ref 0.0–0.5)
HCT: 41.7 % (ref 34.8–46.6)
HGB: 14 g/dL (ref 11.6–15.9)
MCH: 26.5 pg (ref 25.1–34.0)
MCV: 79 fL — ABNORMAL LOW (ref 79.5–101.0)
MONO%: 6.3 % (ref 0.0–14.0)
NEUT#: 3.1 10*3/uL (ref 1.5–6.5)
NEUT%: 60.2 % (ref 38.4–76.8)
RDW: 17.2 % — ABNORMAL HIGH (ref 11.2–14.5)

## 2009-11-19 ENCOUNTER — Encounter: Payer: Self-pay | Admitting: Pulmonary Disease

## 2009-11-20 ENCOUNTER — Ambulatory Visit: Payer: Self-pay | Admitting: Thoracic Surgery

## 2009-11-21 ENCOUNTER — Encounter (HOSPITAL_COMMUNITY): Admission: RE | Admit: 2009-11-21 | Discharge: 2009-12-21 | Payer: Self-pay | Admitting: Family Medicine

## 2009-12-21 ENCOUNTER — Ambulatory Visit: Payer: Self-pay | Admitting: Thoracic Surgery

## 2009-12-21 ENCOUNTER — Encounter: Admission: RE | Admit: 2009-12-21 | Discharge: 2009-12-21 | Payer: Self-pay | Admitting: Thoracic Surgery

## 2010-01-02 ENCOUNTER — Ambulatory Visit (HOSPITAL_COMMUNITY): Payer: Self-pay | Admitting: Family Medicine

## 2010-01-02 ENCOUNTER — Encounter (HOSPITAL_COMMUNITY): Admission: RE | Admit: 2010-01-02 | Discharge: 2010-02-01 | Payer: Self-pay | Admitting: Family Medicine

## 2010-02-12 ENCOUNTER — Ambulatory Visit: Payer: Self-pay | Admitting: Internal Medicine

## 2010-02-13 ENCOUNTER — Encounter (INDEPENDENT_AMBULATORY_CARE_PROVIDER_SITE_OTHER): Payer: Self-pay | Admitting: *Deleted

## 2010-02-13 ENCOUNTER — Ambulatory Visit (HOSPITAL_COMMUNITY): Admission: RE | Admit: 2010-02-13 | Discharge: 2010-02-13 | Payer: Self-pay | Admitting: Internal Medicine

## 2010-02-13 LAB — CBC WITH DIFFERENTIAL/PLATELET
Basophils Absolute: 0 10*3/uL (ref 0.0–0.1)
EOS%: 4.2 % (ref 0.0–7.0)
Eosinophils Absolute: 0.3 10*3/uL (ref 0.0–0.5)
HCT: 42 % (ref 34.8–46.6)
HGB: 14.1 g/dL (ref 11.6–15.9)
LYMPH%: 24.5 % (ref 14.0–49.7)
MCH: 26.4 pg (ref 25.1–34.0)
MCV: 78.4 fL — ABNORMAL LOW (ref 79.5–101.0)
MONO%: 5.4 % (ref 0.0–14.0)
NEUT%: 65.4 % (ref 38.4–76.8)
Platelets: 147 10*3/uL (ref 145–400)
RDW: 15.3 % — ABNORMAL HIGH (ref 11.2–14.5)

## 2010-02-13 LAB — COMPREHENSIVE METABOLIC PANEL
AST: 14 U/L (ref 0–37)
Alkaline Phosphatase: 95 U/L (ref 39–117)
BUN: 15 mg/dL (ref 6–23)
Creatinine, Ser: 0.85 mg/dL (ref 0.40–1.20)
Glucose, Bld: 161 mg/dL — ABNORMAL HIGH (ref 70–99)
Total Bilirubin: 0.4 mg/dL (ref 0.3–1.2)

## 2010-02-18 ENCOUNTER — Encounter: Payer: Self-pay | Admitting: Gastroenterology

## 2010-02-18 ENCOUNTER — Encounter: Payer: Self-pay | Admitting: Cardiology

## 2010-03-25 ENCOUNTER — Ambulatory Visit (HOSPITAL_COMMUNITY): Payer: Self-pay | Admitting: Family Medicine

## 2010-03-25 ENCOUNTER — Encounter (HOSPITAL_COMMUNITY): Admission: RE | Admit: 2010-03-25 | Discharge: 2010-04-24 | Payer: Self-pay | Admitting: Family Medicine

## 2010-03-29 ENCOUNTER — Encounter (INDEPENDENT_AMBULATORY_CARE_PROVIDER_SITE_OTHER): Payer: Self-pay

## 2010-03-29 ENCOUNTER — Encounter (INDEPENDENT_AMBULATORY_CARE_PROVIDER_SITE_OTHER): Payer: Self-pay | Admitting: *Deleted

## 2010-04-01 ENCOUNTER — Encounter (INDEPENDENT_AMBULATORY_CARE_PROVIDER_SITE_OTHER): Payer: Self-pay | Admitting: *Deleted

## 2010-04-01 ENCOUNTER — Telehealth (INDEPENDENT_AMBULATORY_CARE_PROVIDER_SITE_OTHER): Payer: Self-pay | Admitting: *Deleted

## 2010-04-02 ENCOUNTER — Encounter (INDEPENDENT_AMBULATORY_CARE_PROVIDER_SITE_OTHER): Payer: Self-pay

## 2010-04-02 ENCOUNTER — Ambulatory Visit: Payer: Self-pay | Admitting: Gastroenterology

## 2010-04-09 ENCOUNTER — Encounter (INDEPENDENT_AMBULATORY_CARE_PROVIDER_SITE_OTHER): Payer: Self-pay | Admitting: *Deleted

## 2010-04-09 ENCOUNTER — Ambulatory Visit: Payer: Self-pay | Admitting: Gastroenterology

## 2010-05-06 ENCOUNTER — Encounter (HOSPITAL_COMMUNITY): Admission: RE | Admit: 2010-05-06 | Discharge: 2010-06-05 | Payer: Self-pay | Admitting: Family Medicine

## 2010-06-06 ENCOUNTER — Ambulatory Visit: Payer: Self-pay | Admitting: Internal Medicine

## 2010-06-06 ENCOUNTER — Telehealth: Payer: Self-pay | Admitting: Endocrinology

## 2010-06-17 ENCOUNTER — Ambulatory Visit (HOSPITAL_COMMUNITY): Payer: Self-pay | Admitting: Oncology

## 2010-06-18 ENCOUNTER — Ambulatory Visit (HOSPITAL_COMMUNITY): Admission: RE | Admit: 2010-06-18 | Discharge: 2010-06-18 | Payer: Self-pay | Admitting: Internal Medicine

## 2010-06-18 LAB — CBC WITH DIFFERENTIAL/PLATELET
Basophils Absolute: 0 10*3/uL (ref 0.0–0.1)
EOS%: 3.7 % (ref 0.0–7.0)
LYMPH%: 28.5 % (ref 14.0–49.7)
MCH: 26.3 pg (ref 25.1–34.0)
MCV: 79.3 fL — ABNORMAL LOW (ref 79.5–101.0)
MONO%: 4.9 % (ref 0.0–14.0)
RBC: 5.21 10*6/uL (ref 3.70–5.45)
RDW: 15.1 % — ABNORMAL HIGH (ref 11.2–14.5)

## 2010-06-18 LAB — COMPREHENSIVE METABOLIC PANEL
AST: 16 U/L (ref 0–37)
Albumin: 3.8 g/dL (ref 3.5–5.2)
Alkaline Phosphatase: 83 U/L (ref 39–117)
BUN: 11 mg/dL (ref 6–23)
Potassium: 4.7 mEq/L (ref 3.5–5.3)
Sodium: 139 mEq/L (ref 135–145)
Total Bilirubin: 0.7 mg/dL (ref 0.3–1.2)

## 2010-06-20 ENCOUNTER — Ambulatory Visit (HOSPITAL_COMMUNITY)
Admission: RE | Admit: 2010-06-20 | Discharge: 2010-06-20 | Payer: Self-pay | Source: Home / Self Care | Admitting: Orthopedic Surgery

## 2010-06-24 ENCOUNTER — Encounter: Payer: Self-pay | Admitting: Cardiology

## 2010-07-23 ENCOUNTER — Encounter (INDEPENDENT_AMBULATORY_CARE_PROVIDER_SITE_OTHER): Payer: Self-pay | Admitting: *Deleted

## 2010-07-29 ENCOUNTER — Ambulatory Visit: Payer: Self-pay | Admitting: Cardiology

## 2010-07-29 ENCOUNTER — Encounter (HOSPITAL_COMMUNITY)
Admission: RE | Admit: 2010-07-29 | Discharge: 2010-08-28 | Payer: Self-pay | Source: Home / Self Care | Admitting: Oncology

## 2010-07-29 ENCOUNTER — Encounter: Payer: Self-pay | Admitting: Cardiology

## 2010-07-29 DIAGNOSIS — F17200 Nicotine dependence, unspecified, uncomplicated: Secondary | ICD-10-CM | POA: Insufficient documentation

## 2010-08-01 ENCOUNTER — Ambulatory Visit: Payer: Self-pay | Admitting: Pulmonary Disease

## 2010-09-09 ENCOUNTER — Encounter (HOSPITAL_COMMUNITY)
Admission: RE | Admit: 2010-09-09 | Discharge: 2010-10-09 | Payer: Self-pay | Source: Home / Self Care | Attending: Family Medicine | Admitting: Family Medicine

## 2010-09-09 ENCOUNTER — Ambulatory Visit (HOSPITAL_COMMUNITY): Payer: Self-pay | Admitting: Family Medicine

## 2010-09-13 ENCOUNTER — Ambulatory Visit: Payer: Self-pay | Admitting: Internal Medicine

## 2010-09-17 ENCOUNTER — Ambulatory Visit (HOSPITAL_COMMUNITY): Admission: RE | Admit: 2010-09-17 | Payer: Self-pay | Source: Home / Self Care | Admitting: Internal Medicine

## 2010-09-18 ENCOUNTER — Ambulatory Visit (HOSPITAL_COMMUNITY)
Admission: RE | Admit: 2010-09-18 | Discharge: 2010-09-18 | Payer: Self-pay | Source: Home / Self Care | Attending: Internal Medicine | Admitting: Internal Medicine

## 2010-09-23 ENCOUNTER — Encounter: Payer: Self-pay | Admitting: Cardiology

## 2010-09-27 ENCOUNTER — Ambulatory Visit (HOSPITAL_COMMUNITY)
Admission: RE | Admit: 2010-09-27 | Discharge: 2010-09-27 | Payer: Self-pay | Source: Home / Self Care | Attending: Family Medicine | Admitting: Family Medicine

## 2010-10-15 ENCOUNTER — Other Ambulatory Visit: Payer: Self-pay | Admitting: Endocrinology

## 2010-10-15 ENCOUNTER — Ambulatory Visit
Admission: RE | Admit: 2010-10-15 | Discharge: 2010-10-15 | Payer: Self-pay | Source: Home / Self Care | Attending: Endocrinology | Admitting: Endocrinology

## 2010-10-15 LAB — HEMOGLOBIN A1C: Hgb A1c MFr Bld: 9.4 % — ABNORMAL HIGH (ref 4.6–6.5)

## 2010-10-21 ENCOUNTER — Encounter (HOSPITAL_COMMUNITY)
Admission: RE | Admit: 2010-10-21 | Discharge: 2010-11-05 | Payer: Self-pay | Source: Home / Self Care | Attending: Family Medicine | Admitting: Family Medicine

## 2010-10-25 ENCOUNTER — Other Ambulatory Visit: Payer: Self-pay | Admitting: Internal Medicine

## 2010-10-25 DIAGNOSIS — Z85118 Personal history of other malignant neoplasm of bronchus and lung: Secondary | ICD-10-CM

## 2010-10-27 ENCOUNTER — Encounter: Payer: Self-pay | Admitting: Thoracic Surgery

## 2010-10-27 ENCOUNTER — Encounter: Payer: Self-pay | Admitting: Internal Medicine

## 2010-10-28 ENCOUNTER — Encounter: Payer: Self-pay | Admitting: Thoracic Surgery

## 2010-11-07 NOTE — Assessment & Plan Note (Signed)
Summary: OV---STC   Vital Signs:  Patient profile:   65 year old female Height:      62 inches (157.48 cm) Weight:      206 pounds (93.64 kg) BMI:     37.81 O2 Sat:      95 % on Room air Temp:     97.6 degrees F (36.44 degrees C) oral Pulse rate:   101 / minute Pulse rhythm:   regular BP sitting:   114 / 70  (left arm) Cuff size:   large  Vitals Entered By: Brenton Grills CMA Duncan Dull) (October 15, 2010 9:01 AM)  O2 Flow:  Room air CC: F/U on Elevated CBG's/aj Is Patient Diabetic? Yes   Referring Provider:  Dr. Renard Matter Primary Provider:  Dr. Butch Penny  CC:  F/U on Elevated CBG's/aj.  History of Present Illness: pt was last seen here 2-3 years ago.  no cbg record, but states cbg's are poorly-controlled.  however, it was low at midnight, 3 days ago.  this was her first episode of hypoglycemia in more than 1 year.  at other times of day, it is 150-250.  also, it is much more often 200's at hs, than it is low.    Current Medications (verified): 1)  Lantus 100 Unit/ml Soln (Insulin Glargine) .... 60 Units At Bedtime 2)  Apidra 100 Unit/ml Soln (Insulin Glulisine) .... Sliding Scale 3)  Nexium 40 Mg Cpdr (Esomeprazole Magnesium) .... Take 1 Tablet By Mouth Once A Day As Needed 4)  Vitamin D 1000 Unit Tabs (Cholecalciferol) .... Take 1 Tablet By Mouth Once A Day 5)  Crestor 10 Mg Tabs (Rosuvastatin Calcium) .... Take 1 Tablet By Mouth Once A Day 6)  Multivitamins   Tabs (Multiple Vitamin) .... Take 1 Tablet By Mouth Once A Day 7)  Oxygen .... 2 Liters During Sleep  Christoper Allegra [phone] Carol  Allergies (verified): 1)  ! * Niaspan 2)  ! * Chantix  Past History:  Past Medical History: Last updated: 07/11/2009 Asthma Cholelithiasis Colonic polyps, hx of Coronary artery disease (The patient had stenting   of the midsegment of the right coronary artery in 2005 with the Cypher   stent.  The last catheterization demonstrated an EF of 60%.  The right   coronary artery stent was  patent.  The left main was normal.  Circumflex   had luminal irregularities.  The LAD had luminal irregularities.), Diabetes mellitus, type I GERD Hyperlipidemia Hypertension Adenocarcinoma right lung, diagnosed 2010, node postive  Review of Systems  The patient denies syncope.    Physical Exam  General:  obese.  no distress  Pulses:  dorsalis pedis intact bilat.   Extremities:  no deformity.  no ulcer on the feet.  feet are of normal color and temp.  no edema.  Neurologic:  sensation is intact to touch on the feet, but decreased from normal. Additional Exam:  Hemoglobin A1C       [H]  9.4 %    Impression & Recommendations:  Problem # 1:  IDDM (ICD-250.01) Assessment Deteriorated  Medications Added to Medication List This Visit: 1)  Apidra 100 Unit/ml Soln (Insulin glulisine) .... Three times a day (just before each meal) 20-30-25 units. 2)  Apidra 100 Unit/ml Soln (Insulin glulisine) .... Three times a day (just before each meal) 25-30-25 units.  Other Orders: TLB-A1C / Hgb A1C (Glycohemoglobin) (83036-A1C) Est. Patient Level III (45409)  Patient Instructions: 1)  blood tests are being ordered for you today.  please call [phone]  to hear your test results. 2)  pending the test results, please increase apidra to (just before each meal) 20-30-25. 3)  Please schedule a follow-up appointment in 3 months. 4)  good diet and exercise habits significanly improve the control of your diabetes.  please let me know if you wish to be referred to a dietician.  high blood sugar is very risky to your health.  you should see an eye doctor every year. 5)  controlling your blood pressure and cholesterol drastically reduces the damage diabetes does to your body.  this also applies to quitting smoking.  please discuss these with your doctor.  you should take an aspirin every day, unless you have been advised by a doctor not to. 6)  check your blood sugar 4 times a day--before the 3 meals, and  at bedtime.  also check if you have symptoms of your blood sugar being too high or too low.  please keep a record of the readings and bring it to your next appointment here.  please call us sooner if you are having low blood sugar episodes. 7)  (update: i left message on phone-tree: also increase breakfast apidra to 25 units.  ret 1 month)   Orders Added: 1)  TLB-A1C / Hgb A1C (Glycohemoglobin) [83036-A1C] 2)  Est. Patient Level III [[address]]     Preventive Care Screening  Mammogram:    Date:  11/06/2009    Results:  normal

## 2010-11-07 NOTE — Miscellaneous (Signed)
Summary: problem update  Clinical Lists Changes  Problems: Added new problem of OTHER DYSPHAGIA (ICD-787.29) Added new problem of ESOPHAGEAL REFLUX (ICD-530.81)

## 2010-11-07 NOTE — Letter (Signed)
SummaryScience writer Pulmonary Care Appointment Letter  [facility]  [address]   [place], [place] [address]   Phone: [phone]  Fax: [phone]    07/23/2010 MRN: [id number]  Mckinna Thien [address] [place], [place]  [address]  Dear Ms. KUNDA,   Our office is attempting to contact you about your appointment thats currently scheduled for 08/01/2010.  Please call our office at [phone] to reschedule this appointment with Dr. Vassie Loll.  Our registration staff is prepared to assist you with any questions you may have.    Thank you,   Nature conservation officer Pulmonary Division

## 2010-11-07 NOTE — Assessment & Plan Note (Signed)
Summary: per check out/sf      Allergies Added:   Visit Type:  Follow-up Primary [name]:  Dr. Butch Penny  CC:  CAD.  History of Present Illness: The patient presents for followup of [name] known coronary disease. [name] has had no cardiac complaints since I last saw [name]. [name] does have a spot on [name] lung that is being followed for possible recurrent lung cancer. [name] unfortunately continues to smoke cigarettes though [name] very much wants to quit. [name] is trying the electronic cigarettes. [name] does get dyspneic when [name] pushes [name] husband is wheelchair but this is chronic. [name] rarely gets chest discomfort and does not need nitroglycerin. [name] does not report chest pressure, neck or arm discomfort. [name] does not report palpitations, presyncope or syncope. [name] does not have any resting shortness of breath, PND or orthopnea.  Current Medications (verified): 1)  Lantus 100 Unit/ml Soln (Insulin Glargine) .... 60 Units At Bedtime 2)  Apidra 100 Unit/ml Soln (Insulin Glulisine) .... Sliding Scale 3)  Nexium 40 Mg Cpdr (Esomeprazole Magnesium) .... Two Times A Day  Allergies (verified): 1)  ! * Niaspan 2)  ! * Chantix  Past History:  Past Medical History: Reviewed history from 07/11/2009 and no changes required. Asthma Cholelithiasis Colonic polyps, hx of Coronary artery disease (The patient had stenting   of the midsegment of the right coronary artery in 2005 with the Cypher   stent.  The last catheterization demonstrated an EF of 60%.  The right   coronary artery stent was patent.  The left main was normal.  Circumflex   had luminal irregularities.  The LAD had luminal irregularities.), Diabetes mellitus, type I GERD Hyperlipidemia Hypertension Adenocarcinoma right lung, diagnosed 2010, node postive  Past Surgical History: Reviewed history from 07/11/2009 and no changes required. Appendectomy (1970) Cholecystectomy (1989) Hysterectomy (1972) Tonsillectomy (1966) Carpal tunnel  release Total knee replacement PTCA/stent Surgery on [name] neck (2005) Surgery on lower back (2007) Had nerves in back burn (2008) Right partial pneumonectomy, lung cancer confirmed, nod positive  Review of Systems       As stated in the HPI and negative for all other systems.   Vital Signs:  Patient profile:   65 year old female Height:      62 inches Weight:      200 pounds BMI:     36.71 Pulse rate:   100 / minute Resp:     18 per minute BP sitting:   112 / 72  (right arm)  Vitals Entered By: Marrion Coy, CNA (July 29, 2010 1:50 PM)  Physical Exam  General:  Well developed, well nourished, in no acute distress. Head:  normocephalic and atraumatic Mouth:  Gums and palate normal. Oral mucosa normal. Neck:  Neck supple, no JVD. No masses, thyromegaly or abnormal cervical nodes. Chest Wall:  no deformities , positive Port-A-Cath Lungs:  Clear bilaterally to auscultation and percussion. Heart:  Non-displaced PMI, chest non-tender; regular rate and rhythm, S1, S2 without murmurs, rubs or gallops. Carotid upstroke normal, no bruit. Normal abdominal aortic size, no bruits. Femorals normal pulses, no bruits. Pedals normal pulses. No edema, no varicosities. Abdomen:  Bowel sounds positive; abdomen soft and non-tender without masses, organomegaly, or hernias noted. No hepatosplenomegaly. Msk:  Back normal, normal gait. Muscle strength and tone normal. Extremities:  No clubbing or cyanosis. Neurologic:  Alert and oriented x 3. Skin:  Intact without lesions or rashes. Cervical Nodes:  no significant adenopathy Axillary Nodes:  no significant adenopathy Psych:  Normal affect.    EKG  Procedure date:  07/29/2010  Findings:      Review today reported elsewhere  Impression & Recommendations:  Problem # 1:  CORONARY HEART DISEASE (ICD-414.00) [name] had a catheterization last year. [name] has noted symptoms. [name] will continue with risk reduction. Orders: EKG w/ Interpretation  (93000)  Problem # 2:  HYPERLIPIDEMIA (ICD-272.4) [name] was taken off of Vytorin. I'll defer to [name] primary care doctor but had a long conversation with [name] about the benefits of statin. I would suggest that [name] be released on simvastatin. There is apparently a Lipo Med profile pending.  Problem # 3:  TOBACCO ABUSE (ICD-305.1) We again talked about the need to stop smoking. [name] does desperately want to quit. [name] didn't tolerate Chantix. [name] will continue with the electronic cigarette.  Patient Instructions: 1)  Your physician recommends that you schedule a follow-up appointment in: 12 months with Dr Antoine Poche 2)  Your physician has recommended you make the following change in your medication: restart ASA 3)  Your physician discussed the hazards of tobacco use.  Tobacco use cessation is recommended and techniques and options to help you quit were discussed.

## 2010-11-07 NOTE — Medication Information (Signed)
Summary: Prior Authorization & Approval for Nexium/[employer]  Prior Authorization & Approval for Nexium/[employer]   Imported By: Lanelle Bal 10/15/2009 13:36:58  _____________________________________________________________________  External Attachment:    Type:   Image     Comment:   External Document

## 2010-11-07 NOTE — Progress Notes (Signed)
----   Converted from flag ---- ---- 04/01/2010 8:21 AM, Rachael Fee MD wrote: she will need to hold the plavix.  we will need note from prescribing MD if we don't already have that on record  ---- 03/29/2010 4:58 PM, Chales Abrahams CMA Duncan Dull) wrote: Dr Christella Hartigan is it still ok for the pt to hold her plavix for her EGD?  Previsit is Tue. ------------------------------

## 2010-11-07 NOTE — Procedures (Signed)
Summary: Upper Endoscopy  Patient: [name] Note: All result statuses are Final unless otherwise noted.  Tests: (1) Upper Endoscopy (EGD)   EGD Upper Endoscopy       DONE     [facility]     [address]     [place], [place]  [address]           ENDOSCOPY PROCEDURE REPORT           PATIENT:  Rita, [name]  MR#:  [id number]     BIRTHDATE:  1946-08-05, 64 yrs. old  GENDER:  female     ENDOSCOPIST:  Rachael Fee, MD     PROCEDURE DATE:  04/09/2010     PROCEDURE:  EGD with balloon dilatation     ASA CLASS:  Class III     INDICATIONS:  intermittent dysphagia, s/p right upper lobectomy,     chemo/xrt for stage IIIa lung cancer     MEDICATIONS:  Fentanyl 50 mcg IV, Versed 7 mg IV     TOPICAL ANESTHETIC:  Exactacain Spray           DESCRIPTION OF PROCEDURE:   After the risks benefits and     alternatives of the procedure were thoroughly explained, informed     consent was obtained.  The [facility] [id number] endoscope was     introduced through the mouth and advanced to the second portion of     the duodenum, without limitations.  The instrument was slowly     withdrawn as the mucosa was fully examined.     <<PROCEDUREIMAGES>>           stenosis. The GE junction was slightly snug, but smooth benign     appearing. This was dilated with 20mm CRE TTS balloon held     inflated for one minute, without resultant bleeding (see image1     and image10). There was abnormal appearing mucosa at 16cm from     incisors (just below upper esophageal sphincter) that was round,     2cm long. This was completely benign appearing and consistent with     a gastric inlet patch (see image7 and image11).  Otherwise the     examination was normal (see image4, image5, and image6).     Retroflexed views revealed no abnormalities.    The scope was then     withdrawn from the patient and the procedure completed.           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     3) Otherwise normal examination           RECOMMENDATIONS:     Should chew food well, eat slowly.  There is no specific     treatment for gastric inlet patch, but staying on nexium twice     daily is reasonable (best taken 20-30 min before breakfast and     dinner meals).  Adding one over the counter Pepcid or Zantac at     bedtime may also help.     Dr. Christella Hartigan' office will arrange for return follow up visit in 2     months to check on  symptoms.           ______________________________     Rachael Fee, MD           cc: Si Gaul, MD;  Foster Simpson, MD           n.     eSIGNED:   Rachael Fee at 04/09/2010 09:55 AM           Deanne Coffer, [id number]  Note: An exclamation mark (!) indicates a result that was not dispersed into the flowsheet. Document Creation Date: 04/09/2010 9:56 AM _______________________________________________________________________  (1) Order result status: Final Collection or observation date-time: 04/09/2010 09:43 Requested date-time:  Receipt date-time:  Reported date-time:  Referring Physician:   Ordering Physician: Rob Bunting [phone]) Specimen Source:  Source: Launa Grill Order Number: [id number] Lab site:   Appended Document: Upper Endoscopy appt made letter mailed

## 2010-11-07 NOTE — Assessment & Plan Note (Signed)
Summary: rov/jd   Visit Type:  Follow-up Copy to:  Dr. Renard Matter Primary Provider/Referring Provider:  Dr. Butch Penny  CC:  COPD follow up. c/o productive cough with clear mucus, head, nose, chest congestion, and wheezing. breathing is the same.  History of Present Illness: 62/F , COPD ,ex-smoker,  s/p RULobectomy 1/10  & chemo (4/10 last dose ) & RT( 7/10) for stg IIIA (T2N2Mo) adenoCA. 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 09/21/09 >>epistaxis,  Lt nasal mass with fullness-- CT 7 x5 x 4 mm nodule in anterior left lateral aspect of nose (Dr Jearld Fenton)  August 28, 2009 4:55 PM  she had pna in Oct, was placed on avelox x2wks, then Levaquin x2wks, then prednisone dosepak, still having cough with clear mucus, low grade fevers on and off, and and inc SOB with activity.  . Off spiriva since 12/09, uses O2 at night. Reviewed CT chest 08/01/09 >> right perihilar radiation pneumonitis & fibrosis. CXR 11/2 consistent with this finding & volume loss on rt.  September 19, 2009 11:53 AM  Symbicort / spiriva caused throat irritation. Pt d/c Symbicort due to throat burning and chest pain. Pt c/o nasal drainage with trace of blood this AM Cough with green phlegm doxy x 10 days,  Nebs two times a day, 10 mg  Prednisone as directed  October 19, 2009 1:57 PM  Still c/o congestion - clear phlegm, Remians on nexium, diflucan given by dr Christella Hartigan, dr Edwyna Shell put her on neurontin for post thoracotomy pain. Rt mid lung opacity on CT scan 10/10 & last CXR in 10/09/09 needs FU.  August 01, 2010 2:57 PM  CT chest 9/11 showed new ground glass nodule RLL  On vit D supplements, congestion persists, c/o dyspnea on exertion, harder to sing. Did not desaturate on exertion    Preventive Screening-Counseling & Management  Alcohol-Tobacco     Alcohol drinks/day: 0     Smoking Status: current  Packs/Day: 0.5     Pack years: 80  Current Medications (verified): 1)  Lantus 100 Unit/ml Soln (Insulin Glargine) .... 60 Units At Bedtime 2)  Apidra 100 Unit/ml Soln (Insulin Glulisine) .... Sliding Scale 3)  Nexium 40 Mg Cpdr (Esomeprazole Magnesium) .... Take 1 Tablet By Mouth Once A Day As Needed 4)  Vitamin D 1000 Unit Tabs (Cholecalciferol) .... Take 1 Tablet By Mouth Once A Day 5)  Crestor 10 Mg Tabs (Rosuvastatin Calcium) .... Take 1 Tablet By Mouth Once A Day 6)  Multivitamins   Tabs (Multiple Vitamin) .... Take 1 Tablet By Mouth Once A Day 7)  Oxygen .... 2 Liters During Sleep  Christoper Allegra [phone] Carol  Allergies (verified): 1)  ! * Niaspan 2)  ! * Chantix  Past History:  Past Medical History: Last updated: 07/11/2009 Asthma Cholelithiasis Colonic polyps, hx of Coronary artery disease (The patient had stenting   of the midsegment of the right coronary artery in 2005 with the Cypher   stent.  The last catheterization demonstrated an EF of 60%.  The right   coronary artery stent was patent.  The left main was normal.  Circumflex   had luminal irregularities.  The LAD had luminal irregularities.), Diabetes mellitus, type I GERD Hyperlipidemia Hypertension Adenocarcinoma right lung, diagnosed 2010, node postive  Social History: Last updated: 12/26/2008 disabled married Patient states former smoker.    Social History: Packs/Day:  0.5 Smoking Status:  current Alcohol drinks/day:  0  Review of Systems       The patient complains of dyspnea on exertion.  The patient denies anorexia, fever, weight loss, weight gain, vision loss, decreased hearing, hoarseness, chest pain, syncope, peripheral edema, prolonged cough, headaches, hemoptysis, abdominal pain, melena, hematochezia, severe indigestion/heartburn, hematuria, muscle weakness, suspicious skin lesions, difficulty walking, depression, unusual weight change, abnormal bleeding, enlarged lymph nodes, and angioedema.     Vital Signs:  Patient profile:   65 year old female Height:      62 inches Weight:      201 pounds BMI:     36.90 O2 Sat:      95 % on Room air Temp:     97.6 degrees F oral Pulse rate:   106 / minute BP sitting:   112 / 64  (left arm) Cuff size:   large  Vitals Entered By: Zackery Barefoot CMA (August 01, 2010 2:41 PM)  O2 Flow:  Room air  Serial Vital Signs/Assessments:  Comments: Ambulatory Pulse Oximetry  Resting; HR__105___    02 Sat__97%RA___  Lap1 (185 feet)   HR__111___   02 Sat__97%RA___ Lap2 (185 feet)   HR__117___   02 Sat__96%RA___    Lap3 (185 feet)   HR__119___   02 Sat__97%RA___  _x__Test Completed without Difficulty Zackery Barefoot CMA  August 01, 2010 3:20 PM  ___Test Stopped due to:   By: Zackery Barefoot CMA   CC: COPD follow up. c/o productive cough with clear mucus, head, nose,  chest congestion, wheezing. breathing is the same Comments Medications reviewed with patient Verified contact number and pharmacy with patient Zackery Barefoot CMA  August 01, 2010 2:45 PM    Physical Exam  Additional Exam:  Gen. Pleasant, well-nourished, in no distress ENT - no lesions, no post nasal drip Neck: No JVD, no thyromegaly, no carotid bruits Lungs: no use of accessory muscles, no dullness to percussion, decreased BL  without rales or rhonchi  Cardiovascular: Rhythm regular, heart sounds  normal, no murmurs or gallops, no peripheral edema Musculoskeletal: No deformities, no cyanosis or clubbing        Impression & Recommendations:  Problem # 1:  ADENOCARCINOMA, LUNG (ICD-162.9) RLL nodule is suspicious for recurrence CT chest planned in dec 2011, If persists or larger, will need PET  & possible biopsy  Problem # 2:  RADIATION PNEUMONITIS (ICD-508.0)  -with underlying COPD ct bronchodilators - has not tolerated spiriva or symbicort.  Orders: Est. Patient Level III ([address])  Medications Added to Medication List This Visit: 1)  Nexium 40  Mg Cpdr (Esomeprazole magnesium) .... Take 1 tablet by mouth once a day as needed 2)  Vitamin D 1000 Unit Tabs (Cholecalciferol) .... Take 1 tablet by mouth once a day 3)  Crestor 10 Mg Tabs (Rosuvastatin calcium) .... Take 1 tablet by mouth once a day 4)  Multivitamins Tabs (Multiple vitamin) .... Take 1 tablet by mouth once a day 5)  Oxygen  .... 2 liters during sleep  [facility] [phone] carol  Patient Instructions: 1)  Please schedule a follow-up appointment in 3 months. 2)  Ambulatory satn - will set up portable oxygen if low  3)  Ct scan per dr Shirline Frees to FU spot in Rt lower lung 4)  Copy sent to: dr Shirline Frees

## 2010-11-07 NOTE — Progress Notes (Signed)
Summary: rx   Medications Added VYTORIN 10-40 MG TABS (EZETIMIBE-SIMVASTATIN) 1 once daily OMEPRAZOLE 20 MG CPDR (OMEPRAZOLE) 1 two times a day NEXIUM 40 MG CPDR (ESOMEPRAZOLE MAGNESIUM) 1 tablet by mouth two times a day NEXIUM 40 MG CPDR (ESOMEPRAZOLE MAGNESIUM) 1 tablet by mouth two times a day EVISTA 60 MG TABS (RALOXIFENE HCL) 1 once daily FUROSEMIDE 20 MG TABS (FUROSEMIDE) 1 once daily FUROSEMIDE 20 MG TABS (FUROSEMIDE) 1 once daily as needed TOPROL XL 25 MG XR24H-TAB (METOPROLOL SUCCINATE) 1 once daily PLAVIX 75 MG TABS (CLOPIDOGREL BISULFATE) 1 once daily LOTENSIN 10 MG TABS (BENAZEPRIL HCL) 1 once daily ASPIRIN ADULT LOW STRENGTH 81 MG TBEC (ASPIRIN) 1 once daily LANTUS 100 UNIT/ML SOLN (INSULIN GLARGINE) 90 units qhs LANTUS 100 UNIT/ML SOLN (INSULIN GLARGINE) 60 units at bedtime APIDRA 100 UNIT/ML SOLN (INSULIN GLULISINE) 25 in am, 35 aqt lunch, 35 at supper APIDRA 100 UNIT/ML SOLN (INSULIN GLULISINE) three times a day APIDRA 100 UNIT/ML SOLN (INSULIN GLULISINE) sliding scale PROVENTIL HFA 108 (90 BASE) MCG/ACT AERS (ALBUTEROL SULFATE) 2 puffs two times a day prn PROVENTIL HFA 108 (90 BASE) MCG/ACT AERS (ALBUTEROL SULFATE) 2 puffs two times a day prn ALBUTEROL SULFATE (2.5 MG/3ML) 0.083% NEBU (ALBUTEROL SULFATE) two times a day ALBUTEROL SULFATE (2.5 MG/3ML) 0.083% NEBU (ALBUTEROL SULFATE) two times a day ATROVENT 0.03 % SOLN (IPRATROPIUM BROMIDE) two times a day ATROVENT 0.03 % SOLN (IPRATROPIUM BROMIDE) two times a day LEVAQUIN 500 MG TABS (LEVOFLOXACIN) once daily LEVAQUIN 500 MG TABS (LEVOFLOXACIN) once daily ASTEPRO 137 MCG/SPRAY SOLN (AZELASTINE HCL) 1 spray 1 time a day ASTEPRO 137 MCG/SPRAY SOLN (AZELASTINE HCL) 1 spray 1 time a day EQ NICOTINE 7 MG/24HR PT24 (NICOTINE)  NICOTINE 21 MG/24HR PT24 (NICOTINE) 1 once daily EQ NICOTINE 7 MG/24HR PT24 (NICOTINE)  NEXIUM 40 MG CPDR (ESOMEPRAZOLE MAGNESIUM) daily NEXIUM 40 MG CPDR (ESOMEPRAZOLE MAGNESIUM) two times a  day NEXIUM 40 MG CPDR (ESOMEPRAZOLE MAGNESIUM) two times a day PREDNISONE 10 MG TABS (PREDNISONE) Take 2 tabs daily x 14 days, then 1 tab daily x 14 days then 1/2 tab DOXYCYCLINE HYCLATE 100 MG CAPS (DOXYCYCLINE HYCLATE) once daily ALBUTEROL SULFATE (2.5 MG/3ML) 0.083% NEBU (ALBUTEROL SULFATE) two times a day IPRATROPIUM BROMIDE 0.03 % SOLN (IPRATROPIUM BROMIDE) two times a day IPRATROPIUM BROMIDE 0.03 % SOLN (IPRATROPIUM BROMIDE) two times a day DIFLUCAN 100 MG  TABS (FLUCONAZOLE) take 2 pills today, then one pill a day for 9 more days       Phone Note Call from Patient Call back at Home Phone [phone]   Caller: Patient Call For: Christella Hartigan Reason for Call: Talk to Nurse Summary of Call: Patient needs an rx for Nexium sent to [facility] in [place] for twice a day   Initial call taken by: Tawni Levy,  October 08, 2009 10:57 AM  Follow-up for Phone Call        rx sent pt aware Follow-up by: Chales Abrahams CMA Duncan Dull),  October 08, 2009 11:46 AM    Prescriptions: NEXIUM 40 MG CPDR (ESOMEPRAZOLE MAGNESIUM) two times a day  #60 x 11   Entered by:   Chales Abrahams CMA (AAMA)   Authorized by:   Rachael Fee MD   Signed by:   Chales Abrahams CMA (AAMA) on 10/08/2009   Method used:   Electronically to        [facility] (retail)       [address]       [place]  [place], [place]  [address]       Ph: [phone]       Fax: [phone]   RxID:   [id number]

## 2010-11-07 NOTE — Letter (Signed)
[facility]   Imported By: Lester Buchanan 03/11/2010 08:52:11  _____________________________________________________________________  External Attachment:    Type:   Image     Comment:   External Document

## 2010-11-07 NOTE — Progress Notes (Signed)
Summary: REQ FOR BONE SCAN ORDER  Phone Note From Other Clinic   Caller: Lanora Manis from Dr Wadie Lessen office - [phone] Summary of Call: Dr Turner Daniels needs to schedule a 3 phase bone scan of bilateral lower ext to r/o prostetic loosening. Per Lanora Manis, pt's insurance requires this test to be scheduled by pt's PCP who whey have listed as Dr Jonny Ruiz. Katha Hamming that pt was not a patient of Dr Raphael Gibney as her insurance states. Also advised that pt was last seen in 2009 and will most likely need office visit for eval. Dr Wadie Lessen office will need a call back with MD's advisement.  Initial call taken by: Lamar Sprinkles, CMA,  June 06, 2010 2:38 PM  Follow-up for Phone Call        yes, ov would be needed Follow-up by: Minus Breeding MD,  June 06, 2010 3:25 PM  Additional Follow-up for Phone Call Additional follow up Details #1::        Per Lanora Manis at Dr Wadie Lessen office, they were able to  "take care of it". Lanora Manis offered no further explaination. Additional Follow-up by: Margaret Pyle, CMA,  June 06, 2010 3:48 PM

## 2010-11-07 NOTE — Letter (Signed)
Summary: Anticoagulation/[facility]  Anticoagulation/[facility]   Imported By: Sherian Rein 04/11/2010 11:32:32  _____________________________________________________________________  External Attachment:    Type:   Image     Comment:   External Document

## 2010-11-07 NOTE — Letter (Signed)
Summary: [facility]  [facility]   Imported By: Sherian Rein 10/23/2009 07:50:06  _____________________________________________________________________  External Attachment:    Type:   Image     Comment:   External Document

## 2010-11-07 NOTE — Miscellaneous (Signed)
Summary: Lec previsit  Clinical Lists Changes  Observations: Added new observation of ALLERGY REV: Done (04/02/2010 8:39)

## 2010-11-07 NOTE — Assessment & Plan Note (Signed)
Summary: 65mo rov/needs ra sats and recovery sats for [facility]   Visit Type:  Follow-up Copy to:  Dr. Renard Matter Primary Provider/Referring Provider:  Dr. Butch Penny  CC:  Pt here for follow up.  Pt c/o S.O.B, productive cough white to yellow, some wheezing, and and chest tightness.  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  . Off spiriva since 12/09, uses O2 at night. Reviewed CT chest 10/27 >> right perihilar radiation pneumonitis & fibrosis. CXR 11/2 consistent with this finding & volume loss on rt.  September 19, 2009 11:53 AM  Symbicort / spiriva caused throat irritation. Pt d/c Symbicort due to throat burning and chest pain. Pt c/o nasal drainage with trace of blood this AM Cough with green phlegm doxy x 10 days,  Nebs two times a day, 10 mg  Prednisone as directed  October 19, 2009 1:57 PM  Still c/o congestion - clear phlegm, Remians on nexium, diflucan given by dr Christella Hartigan, dr Edwyna Shell put her on neurontin for post thoracotomy pain. Rt mid lung opacity on CT scan 10/10 & last CXR in 10/09/09 needs FU.    Current Medications (verified): 1)  Vytorin 10-40 Mg Tabs (Ezetimibe-Simvastatin) .[name].. 1 Once Daily 2)  Evista 60 Mg Tabs (Raloxifene Hcl) .[name].. 1 Once Daily 3)  Furosemide 20 Mg Tabs (Furosemide) .[name].. 1 Once Daily As Needed 4)  Plavix 75 Mg Tabs (Clopidogrel Bisulfate) .[name].. 1 Once Daily 5)  Lotensin 10 Mg Tabs (Benazepril Hcl) .[name].. 1 Once Daily 6)  Aspirin Adult Low Strength 81 Mg Tbec (Aspirin) .[name].. 1 Once Daily 7)  Lantus 100 Unit/ml Soln (Insulin Glargine) .... 60 Units At Bedtime 8)  Apidra 100 Unit/ml  Soln (Insulin Glulisine) .... Sliding Scale 9)  Nexium 40 Mg Cpdr (Esomeprazole Magnesium) .... Two Times A Day 10)  Diflucan 100 Mg  Tabs (Fluconazole) .... Take 2 Pills Today, Then One Pill A Day For 9 More Days 11)  Neurontin 300 Mg Caps (Gabapentin) .... Take 1 Tablet By Mouth Once A Day  Allergies (verified): 1)  ! * Niaspan 2)  ! * Chantix  Past History:  Past Medical History: Last updated: 07/11/2009 Asthma Cholelithiasis Colonic polyps, hx of Coronary artery disease (The patient had stenting   of the midsegment of the right coronary artery in 2005 with the Cypher   stent.  The last catheterization demonstrated an EF of 60%.  The right   coronary artery stent was patent.  The left main was normal.  Circumflex   had luminal irregularities.  The LAD had luminal irregularities.), Diabetes mellitus, type I GERD Hyperlipidemia Hypertension Adenocarcinoma right lung, diagnosed 2010, node postive  Social History: Last updated: 12/26/2008 disabled married Patient states former smoker.    Past Pulmonary History:  Pulmonary History: presented to [facility] with chest discomfort, enzymes neg, [facility] to [facility] .CT angio chest 07/10/08 >> RUL anterior ASD with RT hilar LNs,ground glass, marked thickening of the distal wall of esophagus. EGD >gastritis, GERD --> nexium two times a day  Bronchoscopy  >>no endobronchial lesions, brushings >>atypical cells suspicious for well differentiated BAC CT guided  Bx >>FNA, non diagnnostic, core >> prominent interstitial chronic inflammation & stromal fibrosis ? does this represent the lesion in question PET 11/13 >>The right upper lobe pulmonary nodule is intensely hypermetabolic consistent with primary lung neoplasm. 11/24 >> did not desaturate on exertion  PFTs showed an FVC of 2.05 with an FEV-1 of 1.78, but a diffusion capacity is 62%, but corrected to 100%. 12/17 >>epistaxis,  Lt nasal mass with fullness-- CT 7 x5 x 4 mm nodule in  anterior left lateral aspect of nose (Dr Jearld Fenton)  Review of Systems       The patient complains of dyspnea on exertion and prolonged cough.  The patient denies anorexia, fever, weight loss, weight gain, vision loss, decreased hearing, hoarseness, chest pain, syncope, peripheral edema, headaches, hemoptysis, abdominal pain, melena, hematochezia, severe indigestion/heartburn, hematuria, muscle weakness, suspicious skin lesions, difficulty walking, depression, unusual weight change, and abnormal bleeding.    Vital Signs:  Patient profile:   65 year old female Height:      62 inches Weight:      198.25 pounds O2 Sat:      96 % on Room air Temp:     98.4 degrees F oral Pulse rate:   116 / minute BP sitting:   100 / 60  (left arm) Cuff size:   large  Vitals Entered By: Zackery Barefoot CMA (October 19, 2009 1:44 PM)  O2 Flow:  Room air CC: Pt here for follow up.  Pt c/o S.O.B, productive cough white to yellow, some wheezing, and chest tightness Comments Medications reviewed with patient Zackery Barefoot CMA  October 19, 2009 1:45 PM    Physical Exam  Additional Exam:  Gen. Pleasant, well-nourished, in no distress ENT - no lesions, no post nasal drip Neck: No JVD, no thyromegaly, no carotid bruits Lungs: no use of accessory muscles, no dullness to percussion, decreased BL  without rales or rhonchi  Cardiovascular: Rhythm regular, heart sounds  normal, no murmurs or gallops, no peripheral edema Musculoskeletal: No deformities, no cyanosis or clubbing        Impression & Recommendations:  Problem # 1:  RADIATION PNEUMONITIS (ICD-508.0)  Favor this as etiology of her symptoms since Oct 2010, about 3 months after last RT . Defer steroids for now since adverse effects   Orders: Est. Patient Level III (11914)  Problem # 2:  COUGH (ICD-786.2)  Treat symptomatically Tesssalon perles for cough three times a day as needed  Codeine cough syrup 2.5 - 5 ml by mouth two times a day as  needed for cough STOP Lotensin  Orders: Est. Patient Level III (78295)  Problem # 3:  ADENOCARCINOMA, LUNG (ICD-162.9) Rt mid lung opacity on CT scan 10/10 & last CXR in 10/09/09 needs FU. CT has been arranged in feb  Medications Added to Medication List This Visit: 1)  Neurontin 300 Mg Caps (Gabapentin) .... Take 1 tablet by mouth once a day 2)  Benzonatate 200 Mg Caps (Benzonatate) .... Three times a day as needed 3)  Promethazine-codeine 6.25-10 Mg/72ml Syrp (Promethazine-codeine) .... 2.5 - 5 ml by mouth two times a day as needed for cough  Patient Instructions: 1)  Copy sent to: Dr Renard Matter 2)  Please schedule a follow-up appointment in 3 months. 3)  Tesssalon perles for cough three times a day as needed  4)  Codeine cough syrup 2.5 - 5 ml by mouth two times a day as needed for cough 5)  STOP Lotensin Prescriptions: PROMETHAZINE-CODEINE 6.25-10 MG/5ML SYRP (PROMETHAZINE-CODEINE) 2.5 -  5 ml by mouth two times a day as needed for cough  #150 x 0   Entered and Authorized by:   Comer Locket. Vassie Loll MD   Signed by:   Comer Locket Vassie Loll MD on 10/19/2009   Method used:   Print then Give to Patient   RxID:   [id number] BENZONATATE 200 MG CAPS (BENZONATATE) three times a day as needed  #90 x 1   Entered and Authorized by:   Comer Locket. Vassie Loll MD   Signed by:   Comer Locket Vassie Loll MD on 10/19/2009   Method used:   Electronically to        [facility] (retail)       [address]       [place], [place]  [address]       Ph: [phone]       Fax: [phone]   RxID:   [id number]    Immunization History:  Pneumovax Immunization History:    Pneumovax:  historical (10/07/2007)

## 2010-11-07 NOTE — Letter (Signed)
Summary: [facility]  [facility]   Imported By: Marylou Mccoy 07/19/2010 13:26:47  _____________________________________________________________________  External Attachment:    Type:   Image     Comment:   External Document

## 2010-11-07 NOTE — Letter (Signed)
Summary: Diabetic Instructions  [facility]  [address] [place], [place] [address]   Phone: [phone]  Fax: [phone]    [name] 1946-01-22 MRN: [id number]   _  _   ORAL DIABETIC MEDICATION INSTRUCTIONS  The day before your procedure:   Take your diabetic pill as you do normally  The day of your procedure:   Do not take your diabetic pill    We will check your blood sugar levels during the admission process and again in Recovery before discharging you home  ________________________________________________________________________  _  _   INSULIN (LONG ACTING) MEDICATION INSTRUCTIONS (Lantus, NPH, 70/30, Humulin, Novolin-N)   The day before your procedure:   Take  your regular evening dose    The day of your procedure:   Do not take your morning dose    _  _   INSULIN (SHORT ACTING) MEDICATION INSTRUCTIONS (Regular, Humulog, Novolog)   The day before your procedure:   Do not take your evening dose   The day of your procedure:   Do not take your morning dose   _  _   INSULIN PUMP MEDICATION INSTRUCTIONS  We will contact the physician managing your diabetic care for written dosage instructions for the day before your procedure and the day of your procedure.  Once we have received the instructions, we will contact you.

## 2010-11-07 NOTE — Progress Notes (Signed)
Summary: Nexium prior auth   Phone Note Outgoing Call Call back at [facility]   Call placed by: Chales Abrahams CMA Duncan Dull),  October 08, 2009 2:52 PM Summary of Call: called and requested prior auth form for nexium.  Form recieved and faxed back to [employer].  Turn around time is 24-48 hours. Initial call taken by: Chales Abrahams CMA Duncan Dull),  October 08, 2009 2:53 PM

## 2010-11-07 NOTE — Letter (Signed)
Summary: Anticoagulation Modification Letter  [facility]  [address] [place], [place] [address]   Phone: [phone]  Fax: [phone]    April 01, 2010  Re:    [name] DOB:    19-Dec-1945 MRN:    [id number]    Dear Dr Renard Matter,  We have scheduled the above patient for an endoscopic procedure. Our records show that  he/she is on anticoagulation therapy. Please advise as to how long the patient may come off their therapy of Plavix prior to the scheduled procedure(s) on 04/09/10. Please respond ASAP.  Patient has a previsit scheduled this week and we need this on record.  Thank you for your timely attention to this matter.   Please fax back/or route the completed form to Patty at [phone].  Thank you for your help with this matter.  Sincerely,  Chales Abrahams CMA (AAMA)   Physician Recommendation:  Hold Plavix 7 days prior ________________  Hold Coumadin 5 days prior ____________  Other ______________________________     Appended Document: Anticoagulation Modification Letter ok to hold plavix 7 days prior to procedure per Dr Renard Matter

## 2010-11-07 NOTE — Letter (Signed)
[facility]   Imported By: Sherian Rein 12/13/2009 10:42:01  _____________________________________________________________________  External Attachment:    Type:   Image     Comment:   External Document

## 2010-11-07 NOTE — Letter (Signed)
Summary: Appt Reminder 2  [facility]  [address] [place], [place] [address]   Phone: [phone]  Fax: [phone]        April 09, 2010 MRN: [id number]    [facility] [address] [place], [place]  [address]    Dear Ms. BOULE,   You have a return appointment with Dr. Christella Hartigan on 06/12/10 at 8:45am.  Please remember to bring a complete list of the medicines you are taking, your insurance card and your co-pay.  If you have to cancel or reschedule this appointment, please call before 5:00 pm the evening before to avoid a cancellation fee.  If you have any questions or concerns, please call [phone].    Sincerely,    Chales Abrahams CMA (AAMA)  Appended Document: Appt Reminder 2 letter mailed

## 2010-11-07 NOTE — Letter (Signed)
Summary: EGD Instructions  [facility]  [address] [place], [place] [address]   Phone: [phone]  Fax: [phone]       [name]    [birth year]    MRN: [id number]       Procedure Day /Date: Tuesday 04-09-10     Arrival Time:  8:30 a.m.     Procedure Time: 9:30 a.m.     Location of Procedure:                    _x_ [facility] (4th Floor)    PREPARATION FOR ENDOSCOPY   On  Tuesday 04-09-10,  THE DAY OF THE PROCEDURE:  1.   No solid foods, milk or milk products are allowed after midnight the night before your procedure.  2.   Do not drink anything colored red or purple.  Avoid juices with pulp.  No orange juice.  3.  You may drink clear liquids until 7:30 a.m. , which is 2 hours before your procedure.                                                                                                CLEAR LIQUIDS INCLUDE: Water Jello Ice Popsicles Tea (sugar ok, no milk/cream) Powdered fruit flavored drinks Coffee (sugar ok, no milk/cream) Gatorade Juice: apple, white grape, white cranberry  Lemonade Clear bullion, consomm, broth Carbonated beverages (any kind) Strained chicken noodle soup Hard Candy   MEDICATION INSTRUCTIONS  Unless otherwise instructed, you should take regular prescription medications with a small sip of water as early as possible the morning of your procedure.  Diabetic patients - see separate instructions.  Stop taking Plavix or Aggrenox on  _Pt has stopped the Plavix 2 months ago -on her own ! ! She was advised to contact her doctor.    Additional medication instructions: Do not take Furosemide am of procedure.             OTHER INSTRUCTIONS  You will need a responsible adult at least 65 years of age to accompany you and drive you home.   This person must remain in the waiting room during your procedure.  Wear loose fitting clothing that is easily removed.  Leave jewelry and other valuables at home.   However, you may wish to bring a book to read or an iPod/MP3 player to listen to music as you wait for your procedure to start.  Remove all body piercing jewelry and leave at home.  Total time from sign-in until discharge is approximately 2-3 hours.  You should go home directly after your procedure and rest.  You can resume normal activities the day after your procedure.  The day of your procedure you should not:   Drive   Make legal decisions   Operate machinery   Drink alcohol   Return to work  You will receive specific instructions about eating, activities and medications before you leave.    The above instructions have been reviewed and explained to me by   Ulis Rias RN  April 02, 2010 9:35 AM  I fully understand and can verbalize these instructions _____________________________ Date _________

## 2010-11-07 NOTE — Letter (Signed)
Summary: [facility]  [facility]   Imported By: Debby Freiberg 04/04/2010 12:00:47  _____________________________________________________________________  External Attachment:    Type:   Image     Comment:   External Document

## 2010-11-07 NOTE — Letter (Signed)
Summary: [facility]  [facility]   Imported By: Lennie Odor 10/04/2010 12:14:33  _____________________________________________________________________  External Attachment:    Type:   Image     Comment:   External Document

## 2010-11-07 NOTE — Letter (Signed)
Summary: Diabetic Instructions  [facility]  [address] [place], [place] [address]   Phone: [phone]  Fax: [phone]    [name] 21-Apr-1946 MRN: [id number]   _  _   ORAL DIABETIC MEDICATION INSTRUCTIONS  The day before your procedure:   Take your diabetic pill as you do normally  The day of your procedure:   Do not take your diabetic pill    We will check your blood sugar levels during the admission process and again in Recovery before discharging you home  ________________________________________________________________________  _x _   INSULIN (LONG ACTING) MEDICATION INSTRUCTIONS (Lantus, NPH, 70/30, Humulin, Novolin-N)   The day before your procedure:   Take  your regular evening dose    The day of your procedure:   Do not take your morning dose    _  _   INSULIN (SHORT ACTING) MEDICATION INSTRUCTIONS (Regular, Humulog, Novolog)   The day before your procedure:   Do not take your evening dose   The day of your procedure:   Do not take your morning dose   _  _   INSULIN PUMP MEDICATION INSTRUCTIONS  We will contact the physician managing your diabetic care for written dosage instructions for the day before your procedure and the day of your procedure.  Once we have received the instructions, we will contact you.

## 2010-11-15 ENCOUNTER — Encounter: Payer: Self-pay | Admitting: Endocrinology

## 2010-11-15 ENCOUNTER — Ambulatory Visit (INDEPENDENT_AMBULATORY_CARE_PROVIDER_SITE_OTHER): Payer: Medicare PPO | Admitting: Endocrinology

## 2010-11-15 DIAGNOSIS — E119 Type 2 diabetes mellitus without complications: Secondary | ICD-10-CM

## 2010-11-21 NOTE — Assessment & Plan Note (Signed)
Summary: PER PT 1 MTH FU STC   Vital Signs:  Patient profile:   65 year old female Height:      62 inches (157.48 cm) Weight:      209.50 pounds (95.23 kg) BMI:     38.46 O2 Sat:      90 % on Room air Temp:     98.0 degrees F (36.67 degrees C) oral Pulse rate:   60 / minute BP sitting:   122 / 76  (left arm) Cuff size:   large  Vitals Entered By: Brenton Grills CMA (AAMA) (November 15, 2010 9:30 AM)  O2 Flow:  Room air CC: 1 month F/U/aj Is Patient Diabetic? Yes   Referring Provider:  Dr. Renard Matter Primary Provider:  Dr. Butch Penny  CC:  1 month F/U/aj.  History of Present Illness: she brings a record of her cbg's which i have reviewed today.  it varies from 60's (before lunch), to 250 (hs).  pt states she feels well in general, except for hypoglycemia sxs, and weight gain.    Current Medications (verified): 1)  Lantus 100 Unit/ml Soln (Insulin Glargine) .... 60 Units At Bedtime 2)  Apidra 100 Unit/ml Soln (Insulin Glulisine) .... Three Times A Day (Just Before Each Meal) 25-30-25 Units. 3)  Nexium 40 Mg Cpdr (Esomeprazole Magnesium) .... Take 1 Tablet By Mouth Once A Day As Needed 4)  Vitamin D 1000 Unit Tabs (Cholecalciferol) .... Take 1 Tablet By Mouth Once A Day 5)  Vytorin 10-20 Mg Tabs (Ezetimibe-Simvastatin) .[name].. 1 Tablet By Mouth Once Daily 6)  Multivitamins   Tabs (Multiple Vitamin) .... Take 1 Tablet By Mouth Once A Day 7)  Oxygen .... 2 Liters During Sleep  Christoper Allegra [phone] Carol  Allergies (verified): 1)  ! * Niaspan 2)  ! * Chantix  Past History:  Past Medical History: Last updated: 07/11/2009 Asthma Cholelithiasis Colonic polyps, hx of Coronary artery disease (The patient had stenting   of the midsegment of the right coronary artery in 2005 with the Cypher   stent.  The last catheterization demonstrated an EF of 60%.  The right   coronary artery stent was patent.  The left main was normal.  Circumflex   had luminal irregularities.  The LAD had  luminal irregularities.), Diabetes mellitus, type I GERD Hyperlipidemia Hypertension Adenocarcinoma right lung, diagnosed 2010, node postive  Review of Systems  The patient denies syncope.    Physical Exam  General:  obese.  no distress  Skin:  injection sites at the anterior abdomen are without lesions.     Impression & Recommendations:  Problem # 1:  DIABETES, TYPE 2 (ICD-250.00) the pattern of her cbg's indicates she needs more apidra, and less lantus  Medications Added to Medication List This Visit: 1)  Lantus 100 Unit/ml Soln (Insulin glargine) .... 40 units at bedtime 2)  Apidra 100 Unit/ml Soln (Insulin glulisine) .... Three times a day (just before each meal) 25-40-35 units. 3)  Vytorin 10-20 Mg Tabs (Ezetimibe-simvastatin) .[name].. 1 tablet by mouth once daily  Other Orders: Est. Patient Level III (45409)  Patient Instructions: 1)  blood tests are being ordered for you today.  please call [phone] to hear your test results. 2)  please increase apidra to (just before each meal) 25-40-35. 3)  reduce lantus to 40 units at bedtime. 4)  Please schedule a follow-up appointment in 1 month. 5)  good diet and exercise habits significanly improve the control of your diabetes.  please let me know if  you wish to be referred to a dietician.  high blood sugar is very risky to your health.  you should see an eye doctor every year. 6)  controlling your blood pressure and cholesterol drastically reduces the damage diabetes does to your body.  this also applies to quitting smoking.  please discuss these with your doctor.  you should take an aspirin every day, unless you have been advised by a doctor not to. 7)  check your blood sugar 4 times a day--before the 3 meals, and at bedtime.  also check if you have symptoms of your blood sugar being too high or too low.  please keep a record of the readings and bring it to your next appointment here.  please call us sooner if you are having low  blood sugar episodes.   Orders Added: 1)  Est. Patient Level III [60454]

## 2010-12-02 ENCOUNTER — Encounter (HOSPITAL_COMMUNITY): Payer: Medicare PPO | Attending: Oncology

## 2010-12-02 ENCOUNTER — Other Ambulatory Visit (HOSPITAL_COMMUNITY): Payer: Medicare PPO

## 2010-12-02 DIAGNOSIS — Z452 Encounter for adjustment and management of vascular access device: Secondary | ICD-10-CM | POA: Insufficient documentation

## 2010-12-16 LAB — COMPREHENSIVE METABOLIC PANEL
ALT: 13 U/L (ref 0–35)
AST: 15 U/L (ref 0–37)
Alkaline Phosphatase: 99 U/L (ref 39–117)
CO2: 28 mEq/L (ref 19–32)
Chloride: 104 mEq/L (ref 96–112)
GFR calc Af Amer: >60 mL/min (ref >60)
GFR calc non Af Amer: >60 mL/min (ref >60)
Glucose, Bld: 149 mg/dL — ABNORMAL HIGH (ref 70–99)
Potassium: 4.6 mEq/L (ref 3.5–5.1)
Sodium: 138 mEq/L (ref 135–145)
Total Bilirubin: 0.5 mg/dL (ref 0.3–1.2)

## 2010-12-16 LAB — CBC
HCT: 45.7 % (ref 36.0–46.0)
Hemoglobin: 15.2 g/dL — ABNORMAL HIGH (ref 12.0–15.0)
MCHC: 33.3 g/dL (ref 30.0–36.0)
RBC: 5.49 MIL/uL — ABNORMAL HIGH (ref 3.87–5.11)

## 2010-12-16 LAB — DIFFERENTIAL
Basophils Absolute: 0 10*3/uL (ref 0.0–0.1)
Basophils Relative: 0 % (ref 0–1)
Eosinophils Absolute: 0.3 10*3/uL (ref 0.0–0.7)
Eosinophils Relative: 4 % (ref 0–5)
Lymphs Abs: 2.1 10*3/uL (ref 0.7–4.0)
Neutrophils Relative %: 60 % (ref 43–77)

## 2010-12-20 ENCOUNTER — Ambulatory Visit: Payer: Medicare PPO | Admitting: Endocrinology

## 2010-12-20 DIAGNOSIS — Z0289 Encounter for other administrative examinations: Secondary | ICD-10-CM

## 2010-12-22 LAB — GLUCOSE, CAPILLARY: Glucose-Capillary: 214 mg/dL — ABNORMAL HIGH (ref 70–99)

## 2011-01-03 ENCOUNTER — Encounter: Payer: Self-pay | Admitting: Pulmonary Disease

## 2011-01-06 ENCOUNTER — Ambulatory Visit (INDEPENDENT_AMBULATORY_CARE_PROVIDER_SITE_OTHER): Payer: Medicare PPO | Admitting: Pulmonary Disease

## 2011-01-06 ENCOUNTER — Encounter: Payer: Self-pay | Admitting: Pulmonary Disease

## 2011-01-06 VITALS — BP 126/78 | HR 109 | Temp 97.9°F | Ht 62.0 in | Wt 216.0 lb

## 2011-01-06 DIAGNOSIS — F17200 Nicotine dependence, unspecified, uncomplicated: Secondary | ICD-10-CM

## 2011-01-06 DIAGNOSIS — J441 Chronic obstructive pulmonary disease with (acute) exacerbation: Secondary | ICD-10-CM

## 2011-01-06 DIAGNOSIS — J449 Chronic obstructive pulmonary disease, unspecified: Secondary | ICD-10-CM

## 2011-01-06 DIAGNOSIS — C3490 Malignant neoplasm of unspecified part of unspecified bronchus or lung: Secondary | ICD-10-CM

## 2011-01-06 NOTE — Progress Notes (Signed)
  Subjective:    Patient ID: [name], female    DOB: 23-Jan-1946, 65 y.o.   MRN: [id number]  HPI 65/F , COPD ,smoker, s/p RULobectomy 1/10 & chemo (4/10 last dose ) & RT( 7/10) for stg IIIA (T2N2Mo) adenoCA. 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  09/21/09 >>epistaxis, Lt nasal mass with fullness-- CT 7 x5 x 4 mm nodule in anterior left lateral aspect of nose (Dr Jearld Fenton)   Pneumonia oct'10  September 19, 2009  Symbicort / spiriva caused throat irritation.  Pt d/c Symbicort due to throat burning and chest pain. Pt c/o nasal drainage with trace of blood this AM  Cough with green phlegm  doxy x 10 days, Nebs two times a day, 10 mg Prednisone as directed   October 19, 2009  Still c/o congestion - clear phlegm, Remians on nexium, diflucan given by dr Christella Hartigan, dr Edwyna Shell put her on neurontin for post thoracotomy pain.  Rt mid lung opacity on CT scan 10/10 & last CXR in 10/09/09 needs FU.   August 01, 2010  CT chest 9/11 showed new ground glass nodule RLL  On vit D supplements, congestion persists, c/o dyspnea on exertion, harder to sing. Did not desaturate on exertion  01/06/2011 Breathing worse , o2 was dc'd when she changed insurance companies, requesting portable concentrator CT chest in dec '11 >> Stable postoperative and postradiation changes involving the right paramediastinal lung and right hilum with treated disease but no findings for recurrent tumor. Contnues to smoke, uses albuterol/atrovent nebs, On allegra  Does not want to enroll in pulm rehab due to back & knee problems Did not desaturate on exertion, but only able to walk 1 lap   Review of Systems Pt denies any significant  nasal congestion or excess secretions, fever, chills, sweats, unintended wt loss, pleuritic or exertional cp, orthopnea pnd or leg swelling.  Pt also denies any obvious  fluctuation in symptoms with weather or environmental change or other alleviating or aggravating factors.    Pt denies any increase in rescue therapy over baseline, denies waking up needing it or having early am exacerbations or coughing/wheezing/ or dyspnea       Objective:   Physical Exam Gen. Pleasant,obese, in mild distress, normal affect ENT - no lesions, no post nasal drip Neck: No JVD, no thyromegaly, no carotid bruits Lungs: no use of accessory muscles, no dullness to percussion, decreased without rales or rhonchi  Cardiovascular: Rhythm regular, heart sounds  normal, no murmurs or gallops, no peripheral edema Abdomen: soft and non-tender, no hepatosplenomegaly, BS normal. Musculoskeletal: No deformities, no cyanosis or clubbing Neuro:  alert, non focal         Assessment & Plan:

## 2011-01-06 NOTE — Patient Instructions (Signed)
Use nasal steroid spray for allergies - Nasonex  We will set you up with oxygen if you qualify You have to quit smoking !! OK to use albuterol/atrovent nebs as needed

## 2011-01-07 NOTE — Assessment & Plan Note (Signed)
In remission based on last CT 6 mnth FU CT palnned by Dr Shirline Frees

## 2011-01-07 NOTE — Assessment & Plan Note (Signed)
She is using nicotrol inhaler & trying to quit

## 2011-01-07 NOTE — Assessment & Plan Note (Signed)
No obvious cause for deterioration of lung function Does not want to retry symbicort or spiriva Will check ONO on RA & tryt o get her nocturnal O2, unfortunately she was unable to walk in the office today to meet qualifying satns for O2

## 2011-01-13 ENCOUNTER — Encounter (HOSPITAL_COMMUNITY): Payer: Medicare PPO | Attending: Oncology

## 2011-01-13 DIAGNOSIS — C3490 Malignant neoplasm of unspecified part of unspecified bronchus or lung: Secondary | ICD-10-CM

## 2011-01-13 DIAGNOSIS — Z452 Encounter for adjustment and management of vascular access device: Secondary | ICD-10-CM | POA: Insufficient documentation

## 2011-01-14 ENCOUNTER — Telehealth: Payer: Self-pay | Admitting: Pulmonary Disease

## 2011-01-14 DIAGNOSIS — J449 Chronic obstructive pulmonary disease, unspecified: Secondary | ICD-10-CM

## 2011-01-14 DIAGNOSIS — Z9981 Dependence on supplemental oxygen: Secondary | ICD-10-CM

## 2011-01-14 NOTE — Telephone Encounter (Signed)
Pt calling for her ONO results she had done on 01/06/11. Pt states she is still having SOB and wants to know if she needs oxygen or not. Please advise Dr. Vassie Loll. Thanks

## 2011-01-14 NOTE — Telephone Encounter (Signed)
Pl get ONO & leave for me to look at

## 2011-01-15 NOTE — Telephone Encounter (Signed)
Shanda Bumps please advise if you have seen pt's ONO come through. Thanks  Carver Fila, CMA

## 2011-01-15 NOTE — Telephone Encounter (Signed)
Dr. Vassie Loll this pt's ONO report is in your "look at".

## 2011-01-16 LAB — DIFFERENTIAL
Basophils Relative: 1 % (ref 0–1)
Eosinophils Relative: 4 % (ref 0–5)
Lymphocytes Relative: 9 % — ABNORMAL LOW (ref 12–46)
Monocytes Relative: 1 % — ABNORMAL LOW (ref 3–12)
Neutro Abs: 13 10*3/uL — ABNORMAL HIGH (ref 1.7–7.7)

## 2011-01-16 LAB — COMPREHENSIVE METABOLIC PANEL
AST: 16 U/L (ref 0–37)
Albumin: 3.7 g/dL (ref 3.5–5.2)
Calcium: 8.8 mg/dL (ref 8.4–10.5)
Chloride: 102 mEq/L (ref 96–112)
Creatinine, Ser: 0.65 mg/dL (ref 0.4–1.2)
GFR calc Af Amer: >60 mL/min (ref >60)
Total Bilirubin: 1.2 mg/dL (ref 0.3–1.2)
Total Protein: 6.2 g/dL (ref 6.0–8.3)

## 2011-01-16 LAB — PROTIME-INR
INR: 0.9 (ref 0.00–1.49)
INR: 1 (ref 0.00–1.49)

## 2011-01-16 LAB — SAMPLE TO BLOOD BANK

## 2011-01-16 LAB — CBC
HCT: 36.7 % (ref 36.0–46.0)
MCHC: 34.7 g/dL (ref 30.0–36.0)
MCV: 82.1 fL (ref 78.0–100.0)
MCV: 82.8 fL (ref 78.0–100.0)
Platelets: 143 10*3/uL — ABNORMAL LOW (ref 150–400)
Platelets: 182 10*3/uL (ref 150–400)
RBC: 4.47 MIL/uL (ref 3.87–5.11)
WBC: 15.4 10*3/uL — ABNORMAL HIGH (ref 4.0–10.5)
WBC: 9.2 10*3/uL (ref 4.0–10.5)

## 2011-01-16 LAB — STOOL CULTURE

## 2011-01-16 LAB — COMPREHENSIVE METABOLIC PANEL WITH GFR
ALT: 14 U/L (ref 0–35)
AST: 16 U/L (ref 0–37)
Albumin: 3.6 g/dL (ref 3.5–5.2)
Alkaline Phosphatase: 85 U/L (ref 39–117)
BUN: 7 mg/dL (ref 6–23)
CO2: 25 meq/L (ref 19–32)
Calcium: 8.5 mg/dL (ref 8.4–10.5)
Chloride: 103 meq/L (ref 96–112)
Creatinine, Ser: 0.59 mg/dL (ref 0.4–1.2)
GFR calc non Af Amer: >60 mL/min
Glucose, Bld: 207 mg/dL — ABNORMAL HIGH (ref 70–99)
Potassium: 3.8 meq/L (ref 3.5–5.1)
Sodium: 135 meq/L (ref 135–145)
Total Bilirubin: 0.8 mg/dL (ref 0.3–1.2)
Total Protein: 6.5 g/dL (ref 6.0–8.3)

## 2011-01-16 LAB — CLOSTRIDIUM DIFFICILE EIA

## 2011-01-16 LAB — GLUCOSE, CAPILLARY
Glucose-Capillary: 172 mg/dL — ABNORMAL HIGH (ref 70–99)
Glucose-Capillary: 208 mg/dL — ABNORMAL HIGH (ref 70–99)

## 2011-01-16 LAB — APTT: aPTT: 27 s (ref 24–37)

## 2011-01-16 NOTE — Telephone Encounter (Signed)
Pl have [facility] correct the information on their report OK for pulse ox order duonebs q 6h prn  (if she does not have already, send Rx for neb also)  If persistent inspite of O2 & nebs, OV with TP

## 2011-01-16 NOTE — Telephone Encounter (Signed)
Unfortunately they seem to have done the test on oxygen rather than RA. However, her O2 level did drop , hence she qualifies for nocturnal O2 & pl send order to [facility] - 3l O2 during sleep

## 2011-01-16 NOTE — Telephone Encounter (Signed)
Spoke w/ pt and she states her ONO was on RA. Pt was advised her O2 did drop and we where going to send an order to [facility] for her to be on 3l o2 during sleep. Pt states she has been getting really SOB w/ very little activity. Pt states she uses her combivent as needed but only last for an hour. Pt also states she would like an rx sent to [facility] for a pulse oximeter bc medicare will pay for it. Please advise Dr. Vassie Loll. Thanks

## 2011-01-16 NOTE — Telephone Encounter (Signed)
lmomtcb x 1.  DME company closed and will need to take care of this in the morning.

## 2011-01-17 MED ORDER — FINGERTIP PULSE OXIMETER MISC: Status: AC

## 2011-01-17 NOTE — Telephone Encounter (Signed)
Spoke with Okey Regal at [facility] and they will send corrected ONO result to Dr. Vassie Loll today refelecting that the pt was on room air during the test, not on oxygen. Order has already beenreceived for pt to be on nocturnal oxygen at 3L. RX for oximeter sent to [facility] per pt request, not [facility] . Pt is already on Duonebs and was instructed to continue using every 6 hours as needed. No RX for neb meds needed at this time. Pt aware to call for OV with TP if she continues to have increased sob.

## 2011-01-20 LAB — URINALYSIS, ROUTINE W REFLEX MICROSCOPIC
Bilirubin Urine: NEGATIVE
Glucose, UA: >1000 mg/dL — AB
Ketones, ur: NEGATIVE mg/dL
Nitrite: NEGATIVE
pH: 5.5 (ref 5.0–8.0)

## 2011-01-20 LAB — COMPREHENSIVE METABOLIC PANEL
AST: 18 U/L (ref 0–37)
Alkaline Phosphatase: 85 U/L (ref 39–117)
BUN: 8 mg/dL (ref 6–23)
BUN: 8 mg/dL (ref 6–23)
CO2: 22 mEq/L (ref 19–32)
Chloride: 100 mEq/L (ref 96–112)
Chloride: 108 mEq/L (ref 96–112)
Creatinine, Ser: 0.65 mg/dL (ref 0.4–1.2)
Creatinine, Ser: 0.66 mg/dL (ref 0.4–1.2)
GFR calc Af Amer: >60 mL/min (ref >60)
GFR calc non Af Amer: >60 mL/min (ref >60)
Glucose, Bld: 175 mg/dL — ABNORMAL HIGH (ref 70–99)
Glucose, Bld: 238 mg/dL — ABNORMAL HIGH (ref 70–99)
Potassium: 4.1 mEq/L (ref 3.5–5.1)
Total Bilirubin: 0.8 mg/dL (ref 0.3–1.2)
Total Bilirubin: 1.2 mg/dL (ref 0.3–1.2)

## 2011-01-20 LAB — CBC
HCT: 35.3 % — ABNORMAL LOW (ref 36.0–46.0)
HCT: 36.7 % (ref 36.0–46.0)
HCT: 39.1 % (ref 36.0–46.0)
HCT: 45.3 % (ref 36.0–46.0)
Hemoglobin: 12.3 g/dL (ref 12.0–15.0)
Hemoglobin: 12.9 g/dL (ref 12.0–15.0)
Hemoglobin: 15 g/dL (ref 12.0–15.0)
MCHC: 32.8 g/dL (ref 30.0–36.0)
MCHC: 34 g/dL (ref 30.0–36.0)
MCV: 82 fL (ref 78.0–100.0)
MCV: 83 fL (ref 78.0–100.0)
MCV: 83.1 fL (ref 78.0–100.0)
Platelets: 130 10*3/uL — ABNORMAL LOW (ref 150–400)
Platelets: 154 10*3/uL (ref 150–400)
RBC: 4.26 MIL/uL (ref 3.87–5.11)
RBC: 4.49 MIL/uL (ref 3.87–5.11)
RBC: 4.71 MIL/uL (ref 3.87–5.11)
RBC: 5.53 MIL/uL — ABNORMAL HIGH (ref 3.87–5.11)
RDW: 14.5 % (ref 11.5–15.5)
RDW: 14.6 % (ref 11.5–15.5)
RDW: 14.9 % (ref 11.5–15.5)
WBC: 13.6 10*3/uL — ABNORMAL HIGH (ref 4.0–10.5)
WBC: 7.5 10*3/uL (ref 4.0–10.5)
WBC: 8.1 10*3/uL (ref 4.0–10.5)
WBC: 8.8 10*3/uL (ref 4.0–10.5)

## 2011-01-20 LAB — GLUCOSE, CAPILLARY
Glucose-Capillary: 135 mg/dL — ABNORMAL HIGH (ref 70–99)
Glucose-Capillary: 141 mg/dL — ABNORMAL HIGH (ref 70–99)
Glucose-Capillary: 159 mg/dL — ABNORMAL HIGH (ref 70–99)
Glucose-Capillary: 161 mg/dL — ABNORMAL HIGH (ref 70–99)
Glucose-Capillary: 163 mg/dL — ABNORMAL HIGH (ref 70–99)
Glucose-Capillary: 174 mg/dL — ABNORMAL HIGH (ref 70–99)
Glucose-Capillary: 176 mg/dL — ABNORMAL HIGH (ref 70–99)
Glucose-Capillary: 182 mg/dL — ABNORMAL HIGH (ref 70–99)
Glucose-Capillary: 191 mg/dL — ABNORMAL HIGH (ref 70–99)
Glucose-Capillary: 197 mg/dL — ABNORMAL HIGH (ref 70–99)
Glucose-Capillary: 202 mg/dL — ABNORMAL HIGH (ref 70–99)
Glucose-Capillary: 206 mg/dL — ABNORMAL HIGH (ref 70–99)
Glucose-Capillary: 209 mg/dL — ABNORMAL HIGH (ref 70–99)
Glucose-Capillary: 239 mg/dL — ABNORMAL HIGH (ref 70–99)
Glucose-Capillary: 253 mg/dL — ABNORMAL HIGH (ref 70–99)

## 2011-01-20 LAB — BLOOD GAS, ARTERIAL
Acid-base deficit: 0.8 mmol/L (ref 0.0–2.0)
Bicarbonate: 23.6 mEq/L (ref 20.0–24.0)
Bicarbonate: 29.6 mEq/L — ABNORMAL HIGH (ref 20.0–24.0)
FIO2: 0.5 %
FIO2: 0.5 %
O2 Saturation: 90.7 %
Patient temperature: 99
Patient temperature: 99.1
TCO2: 24.8 mmol/L (ref 0–100)
pCO2 arterial: 40.2 mmHg (ref 35.0–45.0)
pH, Arterial: 7.368 (ref 7.350–7.400)
pH, Arterial: 7.417 — ABNORMAL HIGH (ref 7.350–7.400)
pO2, Arterial: 68.9 mmHg — ABNORMAL LOW (ref 80.0–100.0)

## 2011-01-20 LAB — BASIC METABOLIC PANEL
BUN: 7 mg/dL (ref 6–23)
CO2: 26 mEq/L (ref 19–32)
CO2: 31 mEq/L (ref 19–32)
CO2: 34 mEq/L — ABNORMAL HIGH (ref 19–32)
Calcium: 8.2 mg/dL — ABNORMAL LOW (ref 8.4–10.5)
Calcium: 8.3 mg/dL — ABNORMAL LOW (ref 8.4–10.5)
Calcium: 8.6 mg/dL (ref 8.4–10.5)
Chloride: 92 mEq/L — ABNORMAL LOW (ref 96–112)
Creatinine, Ser: 0.55 mg/dL (ref 0.4–1.2)
Creatinine, Ser: 0.72 mg/dL (ref 0.4–1.2)
GFR calc Af Amer: >60 mL/min (ref >60)
GFR calc Af Amer: >60 mL/min (ref >60)
GFR calc Af Amer: >60 mL/min (ref >60)
GFR calc non Af Amer: >60 mL/min (ref >60)
GFR calc non Af Amer: >60 mL/min (ref >60)
GFR calc non Af Amer: >60 mL/min (ref >60)
Glucose, Bld: 165 mg/dL — ABNORMAL HIGH (ref 70–99)
Glucose, Bld: 208 mg/dL — ABNORMAL HIGH (ref 70–99)
Potassium: 3.5 mEq/L (ref 3.5–5.1)
Potassium: 4.2 mEq/L (ref 3.5–5.1)
Sodium: 136 mEq/L (ref 135–145)
Sodium: 136 mEq/L (ref 135–145)

## 2011-01-20 LAB — APTT: aPTT: 27 seconds (ref 24–37)

## 2011-01-20 LAB — BRAIN NATRIURETIC PEPTIDE: Pro B Natriuretic peptide (BNP): 35 pg/mL (ref 0.0–100.0)

## 2011-01-20 LAB — TYPE AND SCREEN
ABO/RH(D): B POS
Antibody Screen: NEGATIVE

## 2011-01-20 LAB — POCT I-STAT 3, ART BLOOD GAS (G3+)
O2 Saturation: 91 %
pCO2 arterial: 45.7 mmHg — ABNORMAL HIGH (ref 35.0–45.0)
pO2, Arterial: 66 mmHg — ABNORMAL LOW (ref 80.0–100.0)

## 2011-01-20 LAB — CULTURE, RESPIRATORY W GRAM STAIN

## 2011-01-20 LAB — URINE MICROSCOPIC-ADD ON

## 2011-01-20 LAB — EXPECTORATED SPUTUM ASSESSMENT W GRAM STAIN, RFLX TO RESP C

## 2011-01-20 LAB — PROTIME-INR: Prothrombin Time: 13 seconds (ref 11.6–15.2)

## 2011-01-20 NOTE — Telephone Encounter (Signed)
Have DME assess her for portable O2 & OK to send order if she qualifies.

## 2011-01-21 ENCOUNTER — Encounter: Payer: Self-pay | Admitting: Pulmonary Disease

## 2011-01-21 ENCOUNTER — Ambulatory Visit: Payer: Self-pay | Admitting: Endocrinology

## 2011-01-21 NOTE — Telephone Encounter (Signed)
Order placed for DME assess her for portable O2.

## 2011-02-04 ENCOUNTER — Encounter: Payer: Self-pay | Admitting: Pulmonary Disease

## 2011-02-18 NOTE — Assessment & Plan Note (Signed)
OFFICE VISIT   CORI, JUSTUS  DOB:  Jul 13, 1946                                        December 20, 2008  CHART #:  [id number]   The patient came for followup today.  Her Port-A-Cath is functioning  well.  Her chest x-ray is stable.  Her incisions are well healed.  Her  blood pressure was 146/77, pulse 61, respirations 18, and sats were 97%.  We will see her back again in 2 months with a chest x-ray.   Ines Bloomer, M.D.  Electronically Signed   DPB/MEDQ  D:  12/20/2008  T:  12/21/2008  Job:  [id number]

## 2011-02-18 NOTE — Assessment & Plan Note (Signed)
NAME:  [name], [name]                 CHART#:  [id number]   DATE:  11/16/2008                       DOB:  1946-08-01   CHIEF COMPLAINT:  Severe gastroesophageal reflux.   HISTORY OF PRESENT ILLNESS:  The patient is a 65 year old lady with  history of gastroparesis, chronic GERD, who presents today with  complaints of severe refractory reflux over the last couple of days.  We  last saw her in April 2007.  Since we have last seen her, she  unfortunately was diagnosed with lung cancer.  She underwent resection  and is scheduled to start chemotherapy next week.  This will be followed  with radiation therapy.  She was diagnosed when she presented to the  emergency department back in October 2009 with complaints of chest pain.  This led to a chest CT which revealed the mass.  She ruled out for MI.  While she was hospitalized, she underwent an EGD by Dr. Claudette Head  and had gastritis with the erosions present.  She was asked to take a  PPI b.i.d. and begin on Carafate.  CLO test was negative for H. pylori.  At some point, she came off of her Nexium and restarted it last week  when she started having reflux again.  She had been off of medication  for about a month.  She previously tried Prilosec and omeprazole which  did not seem to help.  Jose Persia the day before I saw her, she started  having severe burning in her chest which burned all the way up into her  throat.  She tried various over-the-counter agents without any relief,  that is when she decided to make the appointment.  She states it is  different than any pain she has ever had, that was cardiac related.  She  does have a history of 2 heart stents placed years ago.  She also notes  that if she touches the lower part of her sternum, she has pain with  that.  She denies any nausea or vomiting, dysphagia, or odynophagia.  Denies any abdominal pain.  Bowel movements are regular.  No blood in  stool or melena.   CURRENT MEDICATIONS:  1. Nexium 40 mg daily.  2. Vytorin 10/40 mg daily.  3. Evista 60 mg daily.  4. Lasix 20 mg daily.  5. Toprol-XL 25 mg daily.  6. Plavix 75 mg daily.  7. Benazepril 10 mg daily.  8. Aspirin 81 mg daily.  9. Lantus 60 units nightly.  10.Apidra 20 units t.i.d.  11.Dexamethasone she will be taking with her chemo.  12.Prochlorperazine 10 mg p.r.n.   ALLERGIES:  Niaspan and Chantix.   PAST MEDICAL HISTORY:  Diabetes mellitus, lung cancer as outlined above,  history of asthma, hypertension, hypercholesterolemia, GERD, EGD and  colonoscopy by Dr. 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  Biopsies were unremarkable.  She  has had a tubal ligation, cholecystectomy, appendectomy, left knee  surgery, back surgery x2, coronary artery disease with stent placement,  carpal tunnel surgery bilaterally, hysterectomy,  appendectomy, lung  cancer resection in January 2010, and neck surgery.   FAMILY HISTORY:  Negative for colorectal cancer.  Mother had throat and  cervical cancer.   SOCIAL HISTORY:  She is married with 4 children.  She is retired on  disability.  She is a former smoker.  No alcohol use.   REVIEW OF SYSTEMS:  See HPI for GI.  Constitutional:  Denies any weight  loss.  Cardiopulmonary:  She has baseline shortness of breath and uses  oxygen p.r.n.  Denies any cough or palpitations.  See HPI for chest  pain.  Genitourinary:  No dysuria or hematuria.   PHYSICAL EXAMINATION:  VITAL SIGNS:  Weight 206, temperature 98, blood  pressure 138/82, and pulse 60.  GENERAL:  Pleasant, obese Caucasian female in no acute distress.  SKIN:  Warm and dry.  No jaundice.   HEENT:  Sclerae nonicteric.  Oropharyngeal mucosa moist and pink.  No  lesions, erythema, or exudate.  NECK:  No lymphadenopathy or thyromegaly.  CHEST:  Lungs are clear to auscultation.  She has scarring in the right  lower back from prior lung resection.  CARDIAC:  Regular rate and rhythm.  No murmurs.  She has tenderness with  palpation of the lower sternum and xiphoid process.  ABDOMEN:  Mild epigastric tenderness to deep palpation.  No rebound or  guarding.  No organomegaly or masses.  No abdominal bruits or hernias.  EXTREMITIES:  Lower extremities, no edema.   IMPRESSION:  The patient is a 65 year old lady with recent lung  resection for lung carcinoma, who has had typical gastroesophageal  reflux disease symptoms which have been refractory in the last couple of  days.  This is in the setting of known chronic gastroesophageal reflux  disease and gastroparesis.  She really denies any other gastrointestinal  symptoms.  It is unlikely that her pain is from a cardiac etiology.  Notably she also has some tenderness with palpation of the xiphoid  process, which therefore possibly have some pain due to musculoskeletal  etiology.  She had been off proton pump inhibitor for over a month and  just started this in the last couple of days.  I suspect she will be  feeling better here in the next few days as her Nexium starts to take  effect.  She may would likely benefit from a promotility agent given her  history of gastroparesis in order to help control her reflux as well.  No alarm symptoms at this time, however.   PLAN:  1. Given that she had EGD less than 6 months ago, we will empirically      treat.  We will have her increase her Nexium temporarily to 40 mg      b.i.d., #60 two refills given, 2 samples provided.  2. Reglan 5 mg p.o. q.a.c. and nightly p.r.n. #120 with 3 refills.      She was warned of potential side effects such as tardive dyskinesia      which can be permanent and  she is in agreement with the plan of      therapy.  3. Carafate 2 weeks for 1 g suspension p.o. q.a.c. and nightly p.r.n.,      no refills.  4. If she is not feeling better by the first week, she will give [place] a      call.  If at any time her pain intensifies or she develops other      symptoms such as diaphoresis or  shortness of breath, she should go      straight to the emergency department or call 911.       Tana Coast, P.A.  Electronically Signed     R. Roetta Sessions, M.D.  Electronically Signed    LL/MEDQ  D:  11/17/2008  T:  11/18/2008  Job:  [id number]   cc:   Angus G. Renard Matter, MD

## 2011-02-18 NOTE — Op Note (Signed)
NAME:  [name], [name]                ACCOUNT NO.:  [id number]   MEDICAL RECORD NO.:  [id number]          PATIENT TYPE:  INP   LOCATION:  3737                         FACILITY:  [facility]   PHYSICIAN:  Felipa Evener, MD  DATE OF BIRTH:  1946/04/26   DATE OF PROCEDURE:  DATE OF DISCHARGE:                               OPERATIVE REPORT   PROCEDURE PERFORMED:  Fiberoptic bronchoscopy with bronchoalveolar  lavage and bronchial brushes.   INDICATION FOR PROCEDURE:  Right upper lobe mass in a patient with  history of smoking and hemoptysis.   After explaining the risks and benefits of the procedure to the patient  including hemothorax, pneumothorax, vocal cord trauma, and drug  reaction; the patient signed the informed consent.  Procedure was  performed in the bronchoscopy lab in [facility].  After giving  the patient 200 of IV fentanyl and 3.5 mg IV Versed with topical  lidocaine, bronchoscope was passed through the patient's mouth to the  cord.  Lidocaine 10 mL of 1% was instilled to the cord.  The cord  appeared to be functioning within normal limits and no significant  abnormalities.  Bronchoscope was passed through the cords to the carina.  A 10 mL of 1% lidocaine was instilled to the carina.  The bronchoscope  was passed into the left medial stem bronchus.  Left upper lobe,  lingula, and lower lobe all appeared to be within normal limits except  for some mild airway edema and mild collapsibility.  The microscope was  withdrawn, passed it into the right medial stem bronchus.  Right upper,  middle, and lower all appeared to be within normal limits.  The  bronchoscope was withdrawn.  In the right upper lobe posterior segment,  a BAL was performed in that area.  The bronchoscope was withdrawn and  placed on the right upper lobe.  Anterior segment brush and a BAL was  performed in that area.  All secretions were suctioned, bronchoscope was  withdrawn to carina.  I saw the patient,  the patient tolerated the  procedure well with no complications.  Samples will be sent for  bacterial, fungal, AFB stain and culture as well as cell count with  differential and cytology brushings to be sent for cytology.  The  patient will be updated with regards to the procedure findings.      Felipa Evener, MD  Electronically Signed     WJY/MEDQ  D:  07/11/2008  T:  07/12/2008  Job:  [id number]

## 2011-02-18 NOTE — Cardiovascular Report (Signed)
NAME:  CHASSITY, LUDKE                ACCOUNT NO.:  [id number]   MEDICAL RECORD NO.:  [id number]          PATIENT TYPE:  INP   LOCATION:  2857                         FACILITY:  [facility]   PHYSICIAN:  Cristy Hilts. Jacinto Halim, MD       DATE OF BIRTH:  1946-08-18   DATE OF PROCEDURE:  03/02/2007  DATE OF DISCHARGE:  03/02/2007                            CARDIAC CATHETERIZATION   ATTENDING CARDIOLOGIST:  Dani Gobble, MD   REFERRING PHYSICIAN:  Angus G. McInnis, MD   PROCEDURES PERFORMED:  1. Left ventriculography.  2. Selective right and left coronary arteriography.  3. Right femoral angiography and closure of right femoral arterial      access with Star close.  4. Abdominal aortogram was performed for abdominal atherosclerosis and      renal artery stenosis.   INDICATIONS:  Ms. [name] is a 65 year old female with history of  hypertension, diabetes, hyperlipidemia and smoking who was admitted to  the hospital with a history very suggestive of unstable angina.  She has  prior PTCA and stenting of the mid segment of the right coronary artery  and August 08, 2004, with implantation of two 3.0 x 23 mm Cypher  stents.  Given her significant cardiovascular ongoing risk factors, she  was brought to the cardiac catheterization lab to reevaluate her  coronary anatomy.   HEMODYNAMIC DATA:  Left ventricle pressure 155/0 with end-diastolic  pressure of 15 mmHg.  The aortic pressures were 156/66 with a mean of 96  mmHg.  There was no pressure gradient across the aortic valve.   ANGIOGRAPHIC DATA.:  Left ventricular systolic function was normal with  ejection fraction of 60%.  No significant mitral regurgitation.   Right coronary artery is large vessel.  It gives origin to a moderate-  sized PDA and a large PLA.  The previously placed stents in the mid  segment of the right coronary artery are widely patent.   Left main is large vessel.  It is smooth and normal.   Circumflex is a large vessel.   Gives origin to large obtuse marginal  one.  It has mild luminal irregularity.   LAD is a large vessel.  Gives origin to a very large diagonal one.  The  LAD ends just before reaching the apex.  It has mild luminal  irregularity.  The diagonal one is a very large vessel which is  equivalent to the LAD.   Abdominal aortogram revealed the presence of two renal arteries, one on  either side.  They are widely patent.  There was no evidence of  abdominal atherosclerosis or abdominal aortic aneurysm.   IMPRESSION:  1. Normal LV systolic function, ejection fraction 60%.  2. Widely patent previously placed stents in the mid right coronary      artery in 2005.  3. Mild luminal irregularity of the left system.   RECOMMENDATIONS:  I still suspect she probably had unstable anginal  symptoms given her significant cardiovascular ongoing risks.  The  lesions may have stabilized with aggressive medical therapy including  Integrilin and heparin.  I have discussed extensively  with the patient  regarding smoking cessation and control of diabetes.  She can be  discharged home today with outpatient follow-up.   A total of 85 mL of contrast was utilized for diagnostic angiography.   TECHNIQUE OF THE PROCEDURE:  Under sterile precautions using a 6-French  right femoral artery access, a 6-French multipurpose B2 catheter was  utilized and advanced into the ascending aorta over a J-wire and left  ventriculography was performed both in LAO and RAO projection.  The  catheter was then pulled into the ascending aorta and right coronary  artery selectively engaged and angiography was performed.  Then the left  main coronary artery selectively engaged and angiography performed.  Then the catheter was pulled back in the abdominal aorta and abdominal  aortogram was performed.  Then the catheter was pulled out of the body  in the usual fashion.  Right femoral angiography was performed through  the arterial access  sheath and access closed with Star close with  excellent hemostasis.  The patient tolerated the procedure.  No  immediate complications noted.      Cristy Hilts. Jacinto Halim, MD  Electronically Signed     JRG/MEDQ  D:  03/02/2007  T:  03/03/2007  Job:  [id number]   cc:   Dani Gobble, MD  Ishmael Holter. Renard Matter, MD

## 2011-02-18 NOTE — Letter (Signed)
September 07, 2008   Oretha Milch, MD  [address] [place], [place] [address]   Re:  [name], [name]                DOB:  July 31, 1946   Dear Kathy Breach,   I appreciate the opportunity of seeing the patient.  This 65 year old  patient is the wife of Mr. Asuncion Tapscott who has recurrent esophageal  cancer.  She developed some chest pain and shortness of breath and was  admitted to [facility] and then transferred to [facility].  At the time  of her workup, she was found to have a right upper lobe lesion.  She had  a previous right coronary artery stenting in 2005.  He came in with a 2-  day history of indigestion and right chest discomfort that was  retrosternal.  For the right upper lobe lesion, she underwent  bronchoscopy that showed atypical cells, so then had a needle biopsy,  which also showed atypical cells.  She was treated with Carafate slurry  with decrease in her chest pain and also seen by Dr. Antoine Poche who felt  this pain was noncardiac in origin.  She had no hemoptysis, fever,  chills, or excessive sputum.   MEDICATIONS:  Aspirin 325 mg a day, Vytorin 10/40 a day, Toprol 25 mg a  day, Lotensin 10 mg daily, nicotine patch, albuterol 2.5 q.6 h.,  Atrovent, guaifenesin, Advair 250/50 Diskus twice a day, Nexium 40 mg  twice a day, Evista 60 mg a day, Lasix 20 mg a day, Lantus 50 units in  the and 50 units at night,  and Plavix 75 mg a day, and Carafate 1 g 4  times a day.   ALLERGIES:  She is allergic to niacin and contrast media in milk.   PAST MEDICAL HISTORY:  She has diabetes mellitus type 2, hypertension, coronary artery disease,  chronic obstructive pulmonary disease, and tobacco abuse.   FAMILY HISTORY:  Positive for cancer in her husband as mentioned has  gastric cancer.   OTHER SURGERIES:  She has had is a tonsillectomy in 1966, tubal ligation  in 1969, appendectomy in 1970, hysterectomy in 1972, ovariectomy in  1974, carpal tunnel in 1982, gallbladder in 1989,  left total knee in  1998, disk surgery in 2005 and 2007.  She has had a course of needle and  the bronchoscopy recently.   SOCIAL HISTORY:  She is married, with 5 children.  She is retired.  She  has smoked for 50 years, quit smoking a week ago.  Does not drink  alcohol on a regular basis.   REVIEW OF SYSTEMS:  Vital Signs:  She is 207 pounds.  She is 5 feet 2  inches.  General:  Weight has been stable.  Cardiac:  Shortness of  breath with exertion and lying flat.  Pulmonary:  She has been  intermittently on home oxygen, but her sats are 92% without oxygen.  She  had bronchitis and asthma.  GI:  She has had reflux, hiatal hernia, and  dysphagia.  GU:  She has had frequent urination.  Vascular:  She has got  pain in leg with walking.  No DVT or TIAs.  Neurological:  No dizziness,  headaches, blackouts, or seizure.  Musculoskeletal:  See past medical  history, chronic arthritis.  Psychiatric:  No depression or nervous.  Eye/ENT:  No change in her eyesight or hearing.  Hematological:  No  problems with bleeding, clotting disorders, or  anemia.   PHYSICAL EXAMINATION:  GENERAL:  He is an obese Caucasian female, in no  acute distress.  HEAD, EYES, EARS, NOSE, AND THROAT:  Unremarkable.  NECK:  Supple without thyromegaly.  CHEST:  Clear to auscultation and percussion.  HEART:  Regular sinus rhythm.  No murmurs.  ABDOMEN:  Soft.  No hepatosplenomegaly.  EXTREMITIES:  Pulses are 1+.  There is no clubbing or edema.  NEUROLOGICAL:  She is oriented x3.  Sensory and motor intact.  Cranial  nerves intact.   Her pulmonary function tests showed an FVC of 2.05 with an FEV-1 of  1.78, but a diffusion capacity is 62%, but corrected to 100%.  I  discussed the situation with her.  The lesion is located in the right  upper lobe.  There is questionable subhilar nodes.  She would probably  would require a right upper lobectomy and node dissection given the  location of the lesion.  I does not feel that  we can get this removed  this without a lobectomy.  I think, her pulmonary function tests are  adequate and required lobectomy.  Given her multiple medical problems,  she would be at some increased risk, but she agrees with the surgery and  we will plan to do this on October 13, 2007, at [facility].  We will  stop her Plavix 1 week before.  I appreciate the opportunity of seeing  the patient.   Sincerely.   Ines Bloomer, M.D.  Electronically Signed   DPB/MEDQ  D:  09/07/2008  T:  09/08/2008  Job:  [id number]   cc:   Felipa Evener, MD  Rollene Rotunda, MD, [facility]  Thalia Party G. Renard Matter, MD

## 2011-02-18 NOTE — Letter (Signed)
October 09, 2009   [name]. [name], MD  [address]  [place], [place] [address]   Re:  [name], [name]                DOB:  Oct 13, 1945   Dear [name],   I saw the patient back today.  Her blood pressure is 116/70, pulse 100,  respirations 18, sats were 92%.  Even though, it has been a year she is  still complaining of more chest pain as that appears to be  postthoracotomy pain with some pain along the right costal margin.  I  started her on Neurontin 300 mg twice a day and dose increased to 600 mg  twice a day.  I will see her back again in 4 weeks to see if her pain is  improved.  Apparently, a recent workup showed no evidence of recurrence  of her cancer at the present time, although with the previous findings,  she is at a high risk of recurrence.   [name], M.D.  Electronically Signed   DPB/MEDQ  D:  10/09/2009  T:  10/10/2009  Job:  [id number]   cc:   Oretha Milch, MD

## 2011-02-18 NOTE — Discharge Summary (Signed)
[name], [name]                ACCOUNT NO.:  [id number]   MEDICAL RECORD NO.:  [id number]          PATIENT TYPE:  INP   LOCATION:  2041                         FACILITY:  [facility]   PHYSICIAN:  Ines Bloomer, M.D. DATE OF BIRTH:  10-Feb-1946   DATE OF ADMISSION:  10/12/2008  DATE OF DISCHARGE:  10/19/2008                               DISCHARGE SUMMARY   PRIMARY ADMITTING DIAGNOSES:  1. Right upper lobe lung mass.  2. Left nasal lesion.   ADDITIONAL/DISCHARGE DIAGNOSES:  1. Adenocarcinoma of the right upper lobe, moderately differentiated,      (T2a N2 MX).  2. 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  3. Type 2 diabetes mellitus, insulin dependent.  4. Hypertension.  5. Coronary artery disease.  6. History of chronic obstructive pulmonary disease.  7. History of tobacco abuse.  8. Dyslipidemia.  9. History of hiatal hernia.  10.History of esophageal web status post dilatation.  11.Osteoarthritis.   PROCEDURES PERFORMED:  1. Excision of left nasal lesion.  2. Right video-assisted thoracoscopic surgery and right thoracotomy      with right upper lobectomy and lymph node dissection.   HISTORY:  The patient is a 65 year old female who developed chest pain  in October 2009.  She was admitted for further evaluation and at that  time had a chest x-ray, which showed a right upper lobe mass.  Her chest  pain was ultimately determined to be noncardiac in origin, and she was  discharged home for outpatient followup of her lung lesion.  She has  since then undergone bronchoscopy, which showed atypical cells but was  nondiagnostic for malignancy.  She also underwent a CT-guided biopsy,  which once again showed atypical cells but was nondiagnostic.  She  underwent a PET scan on August 25, 2009, which was strongly positive  for malignant uptake in the right upper lobe area.  She was subsequently  referred to Dr. Edwyna Shell for further evaluation.  Dr. Edwyna Shell felt that her  upper  lobe lesion most likely represented a cancer, and he recommended  proceeding with a right VATS at this time.  Also, the patient had had a  left nasal lesion, which was enlarging in size and it was determined  that Dr. Jearld Fenton from ENT would perform a concomitant left nasal  excisional biopsy when she was scheduled for her right VATS.  All risks,  benefits, and alternatives of surgery were explained to the patient, and  she agreed to proceed.   HOSPITAL COURSE:  She was admitted to [facility] on October 12, 2008, and underwent a right upper lobectomy and excision of left nasal  lesion as described in detail above.  She tolerated the procedures well  and was transferred to the SICU in stable condition.  Initially  postoperatively, she developed a productive cough and increased O2  requirement with worsening atelectasis and infiltrate on her chest x-  ray.  She was started on Fortaz for antibiotic coverage.  She remained  stable and afebrile, and her O2 requirements improved.  She was treated  with aggressive pulmonary toilet measures.  At this point, she has  completed a full course of antibiotics, and her chest x-ray is improved.  She did develop significant oral candidiasis secondary to antibiotics  and was started on antifungal treatment.  Her pulmonary status remained  stable, and she is transferred to the Step-Down Unit at this point.  Her  chest tubes were removed in the usual fashion and her followup chest x-  ray shows no evidence of pneumothorax.  Presently, she is improving.  Her incisions are all healing well.  Her chest tubes are out, and she is  maintaining O2 sats of greater than 90% on room air.  Her incisions are  healing well.  She has remained afebrile, and her vital signs are  stable.  She is improving with mobility and is ambulating with  assistance in the hall.  Her most recent labs showed a sodium of 136,  potassium 3.4 which has been repleted, BUN 8, creatinine  0.63.  Hemoglobin 11.8, hematocrit 35.3, white count 8.8, platelets 148, BNP of  35.  Sputum culture revealed normal flora.  She is currently being  transferred to the floor.  It is felt that if she remains stable over  the next 24-48 hours, she will hopefully be ready for discharge home.  She will undergo a repeat chest x-ray and labs on the morning of October 20, 2007.   Discharge medications will be as follows:  1. Tylox 1-2 q.4 h. p.r.n. for pain.  2. Diflucan 100 mg x1 final dose.  3. Magic mouth wash 1 teaspoon swish and swallow q.a.c. and at night.  4. Nicotine patch 14 mg daily.  5. Plavix 75 mg daily.  6. Aspirin 81 mg daily.  7. Vytorin 10/40 daily.  8. Benazepril 10 mg daily.  9. Toprol-XL 25 mg daily.  10.Spiriva HandiHaler q.a.m.  11.Omeprazole 20 mg b.i.d.  12.Evista 60 mg nightly.  13.Lantus 60 units nightly.  14.Apidra 25 units q.a.m., 35 units at lunch, and 35 units with      dinner.   DISCHARGE INSTRUCTIONS:  She is asked to refrain from driving, heavy  lifting, or strenuous activity.  She may continue ambulation and use of  her incentive spirometer.  She will continue a carbohydrate-modified  medium calorie diet.   DISCHARGE FOLLOWUP:  She will see Dr. Edwyna Shell back in the office in 1  week with a chest x-ray.  She will follow up with Dr. Jearld Fenton as directed.  If she experiences any problems or has questions in the interim, she is  asked to call our office immediately.      Coral Ceo, P.A.      Ines Bloomer, M.D.  Electronically Signed    GC/MEDQ  D:  10/18/2008  T:  10/18/2008  Job:  [id number]   cc:   Suzanna Obey, M.D.  Coralyn Helling, MD  Thalia Party G. Renard Matter, MD  Rollene Rotunda, MD, [facility]

## 2011-02-18 NOTE — Assessment & Plan Note (Signed)
OFFICE VISIT   FLAVIA, BRUSS  DOB:  07-28-1946                                        October 25, 2008  CHART #:  [id number]   The patient is status post right thoracotomy for right upper lobectomy  on October 12, 2008.  This was positive for adenocarcinoma T2, N2, Mx.  The patient did well postoperatively and was discharged to home on  October 19, 2008.  She presents today for her 1-week followup visit.  The patient states she is slowly progressing.  She does occasionally  need to use oxygen at home with activity.  Home health nurse is set up  and has been coming out to check her oxygen saturations.  She states  that walking from the parking lot also she needed her oxygen.  She  states she gets very tired with short distances.  Her appetite is  improving.  She denies any coughing, wheezing, nausea, vomiting, opening  or drainage from any of her incision sites.   PHYSICAL EXAMINATION:  VITAL SIGNS:  Blood pressure 156/80, pulse of 60,  and respirations of 20.  RESPIRATORY:  Positive expiratory wheezing.  CARDIAC:  Regular rate and rhythm.  SKIN:  Incisions, all incisions are clean, dry, and intact and healing  well.  Chest tube stitches were removed.   STUDIES:  The patient had PA and lateral chest x-ray done on October 25, 2008, showing stable chest x-ray with no active process.  No  pneumothorax or effusion noted.   IMPRESSION AND PLAN:  The patient is progressing well postoperatively.  The patient was seen and evaluated by Dr. Edwyna Shell.  Dr. Edwyna Shell evaluated  the patient's chest x-ray.  We will contact Dr. Asa Lente office to  arrange a followup appointment for the patient to get in with Dr.  [name].  Plan to see her back in 2-3 weeks with a repeat chest x-ray.  The patient is told to  continue increasing her activities.  She is told still no heavy lifting  and no driving.  Prescription for oxycodone 5 mg total #40 was given to  the patient at  office visit.   Ines Bloomer, M.D.  Electronically Signed   KMD/MEDQ  D:  10/25/2008  T:  10/26/2008  Job:  [id number]

## 2011-02-18 NOTE — Group Therapy Note (Signed)
NAME:  [name], [name]                ACCOUNT NO.:  [id number]   MEDICAL RECORD NO.:  [id number]          PATIENT TYPE:  EMS   LOCATION:  ED                            FACILITY:  [facility]   PHYSICIAN:  Mila Homer. Sudie Bailey, M.D.DATE OF BIRTH:  1946/07/31   DATE OF PROCEDURE:  DATE OF DISCHARGE:                                 PROGRESS NOTE   SUBJECTIVE:  I was asked to see this 65 year old woman in the emergency  room.  She has a history of coronary artery stent placement about 6  years ago through Dr. Jacinto Halim and is now followed by Dr. Antoine Poche of  [facility].   She developed intermittent pains of chest over the last day or two,  became significantly worse on Thursday and came in the hospital and told  me the pain she was having is the exact same pain she has had prior to  her stent placement 6 years ago.  Pain was unrelieved by nitroglycerin.   She is on Nexium 40 mg b.i.d. for reflux, furosemide 20 mg p.r.n.,  Toprol-XL 25 mg daily, Plavix 75 mg daily, benazepril 10 mg daily,  aspirin 81 mg daily, Lantus 9 units nightly, Apidra sliding scale, and  Vytorin 10/40 daily.   OBJECTIVE:  VITAL SIGNS:  The pulse is 63, respiratory rate 18, and  blood pressure 153/72.  O2 sat is 95%.  GENERAL:  She is oriented and alert, in no acute distress.  She is  obese.  ABDOMEN:  Soft without organomegaly or mass.  No tenderness.  SKIN:  Turgor is normal.  HEART:  Irregular rhythm, rate of about 70.  LUNGS:  Clear throughout.   Currently, her EKG is unchanged from a prior EKG of about a year and  half ago.  Cardiac enzymes are normal.   ASSESSMENT:  1. Probable coronary artery disease.  2. Morbid obesity.  3. Type 2 insulin-dependent diabetes.  4. Reflux esophagitis.  5. Benign essential hypertension.  6. Hypercholesterolemia.  7. Chronic back pain.   PLAN:  I discussed with the ER physician.  I feel if it is possible to  transfer her to [facility], where she has a cardiologist,  this  should be done.  Otherwise, she will require monitoring in our ICU.  I  also discussed this with the patient and her husband.      Mila Homer. Sudie Bailey, M.D.  Electronically Signed     SDK/MEDQ  D:  07/09/2008  T:  07/10/2008  Job:  [id number]

## 2011-02-18 NOTE — Consult Note (Signed)
NAME:  [name], [name] NO.:  [id number]   MEDICAL RECORD NO.:  [id number]          PATIENT TYPE:  INP   LOCATION:  3737                         FACILITY:  [facility]   PHYSICIAN:  Felipa Evener, MD  DATE OF BIRTH:  1946-01-15   DATE OF CONSULTATION:  DATE OF DISCHARGE:                                 CONSULTATION   IDENTIFICATION:  The patient is a 65 year old female with a past medical  history significant for COPD that was diagnosed 2 years prior to  presentation who presents to the hospital with a chief complaint of  shortness of breath.  The patient presented to the cardiology service.  A cardiac workup as per Dr. Antoine Poche was performed and was all negative  and the patient's shortness of breath persisted.  A CT of the chest was  ordered and revealed a right upper lobe anterior segment mass.  Pulmonary service care was called to evaluate.  The patient reports she  has been having no weight gain or weight loss.  She does report 2  episode of hemoptysis over the 2 days while she was hospitalized.  Denied any sick contact.  Denied any recent travel.  She gets therapy  for tuberculosis on a regular basis.  She says the cost apparently has  been always negative.  She does report history of smoking one-half pack  per day for the past 46 years and denies any personal history of cancer.   PAST MEDICAL HISTORY:  Significant for coronary artery disease, status  post Cypher stenting of the right coronary artery in 2005, type 2  diabetes, COPD, hypertension, status post endoscopy in 2000 that was  negative for cervical esophageal web requiring dilation.   PAST SURGICAL HISTORY:  Hysterectomy, cholecystectomy, appendectomy,  back surgery, and knee replacement.   HOME MEDICATIONS:  Vytorin, aspirin, Plavix, Toprol, Lantus, benazepril,  Nexium, Apidra insulin sliding scale as needed, Lasix, and Evista.   ALLERGIES:  CHANTIX and NIASPAN.   SOCIAL HISTORY:  [place]  No alcohol or  drug abuse.  She smokes up to 1-1/2 packs of cigarettes per day, been  smoking since age 80.   FAMILY HISTORY:  Negative for cancer, but positive for COPD and  congestive heart failure.   REVIEW OF SYSTEMS:  A 12-point review of systems was obtained and was  negative, other than mentioned in the HPI.   PHYSICAL EXAMINATION:  GENERAL:  Well-appearing female who is  comfortable, but not in distress.  VITAL SIGNS:  Temperature 97.8, heart rate is 49, respiratory rate is  20, blood pressure 154/65, and saturation 96% on room air.  HEENT:  Normocephalic and atraumatic.  Pupils equal, round, and reactive  to light.  Extraocular movements are intact.  Oral and nasal mucosa  within normal limits.  NECK:  No thyromegaly or lymphadenopathy.  Carotid upstroke brisk, no  bruits appreciated.  HEART:  Regular rate and rhythm.  S1 and S2.  No murmurs, rubs, or  gallops appreciated.  LUNGS:  Decreased breath sounds at bases.  Bibasilar crackles  appreciated.  ABDOMEN:  Soft, nontender, and nondistended.  Positive bowel sounds.  EXTREMITIES:  No edema.  No fits appreciated.  NEUROLOGICAL:  Grossly intact.   LABORATORY STUDIES:  Reviewed, significant for chest CT revealed  anterior segment right upper lobe speculated mass with a question  obstruction of the right upper lobe anterior segment of main bronchus.  TSH is 1.39.  Cardiac enzymes were negative.  BMP:  Sodium 142,  potassium 3.3, chloride 110, CO2 of 22, glucose 78, BUN 7, and  creatinine 0.59.  CBC revealed a white blood cell count of 8.1,  hemoglobin 14.2, hematocrit 42, and platelet count 122.   ASSESSMENT AND PLAN:  The patient is a 65 year old female with a past  medical history significant for chronic obstructive pulmonary disease,  who is actively still smoking, presented to the Pulmonary Critical Care  unit with a right upper lobe mass with appearance and high suspicion for  cancerous  process, so I spoke with the patient given her options and  decided to perform bronchoscopy at noon tomorrow, we will maintain the  patient n.p.o., checking her coags, and hold the anticoagulants  overnight.      Felipa Evener, MD  Electronically Signed     WJY/MEDQ  D:  07/10/2008  T:  07/11/2008  Job:  [id number]

## 2011-02-18 NOTE — Assessment & Plan Note (Signed)
OFFICE VISIT   KHARA, RENAUD  DOB:  Feb 07, 1946                                        December 21, 2009  CHART #:  [id number]   The patient comes in today for a 4-week followup.  She is status post a  right upper lobectomy on October 19, 2008, for a stage IIIA non-small  cell lung cancer.  She continues to have right axillary and anterior  chest wall pain, although the pain in her posterior chest has improved.  Dr. Edwyna Shell gave her prescription for Lyrica on her last visit and she  states that although it does seem to help it, its effects wear off as  the day progresses.  She has also had some recent wheezing and shortness  of breath.  Dr. Vassie Loll had given her prescription for Spiriva but she has  not been taking it regularly.  She is concerned about having a mammogram  which was recommended to her by Dr. [name] on her last visit.  She  feels that she will be unable to complete the study secondary to her  right chest wall and breast pain.   On physical exam blood pressure 142/88, pulse 110, respirations 20, O2  saturation 91% on room air.  Her right thoracotomy incisions have healed  well.  Heart has regular rate and rhythm.  Lungs have expiratory wheezes  bilaterally and a few coarse rhonchi which cleared with cough.  Chest x-  ray is stable.   ASSESSMENT/PLAN:  The patient is stable from a right upper lobectomy.  Dr. Edwyna Shell has seen the patient today and reviewed her chest x-ray.  We  will continue her Lyrica and Dr. Edwyna Shell has given her a refill.  He  feels that due to her significant right rib pain and anterior chest wall  pain, which radiates into the breast, that at this time we should defer  a mammogram and proceed as planned with a chest CT in May.  We will  reevaluate the situation at that point and make any  further recommendations then.  We will plan on seeing her back following  her chest CT in May and she knows to call sooner if she experiences  any  problems.   Ines Bloomer, M.D.  Electronically Signed   GC/MEDQ  D:  12/21/2009  T:  12/22/2009  Job:  [id number]   cc:   Lajuana Matte, MD

## 2011-02-18 NOTE — Assessment & Plan Note (Signed)
OFFICE VISIT   [name], [name]  DOB:  08-Nov-1945                                        November 08, 2008  CHART #:  [id number]   The patient came for followup today.  Her chest x-ray is stable.  She  would start chemotherapy next week with Dr. [name].  Her blood pressure  is 136/75, pulse 46, respirations 18, and sats are 96%.  Her incisions  are well healed.  Chest x-ray is stable.  She is much improved since we  saw her last.  I will see her back again.   Ines Bloomer, M.D.  Electronically Signed   DPB/MEDQ  D:  11/08/2008  T:  11/09/2008  Job:  [id number]

## 2011-02-18 NOTE — Op Note (Signed)
[name], [name]                ACCOUNT NO.:  [id number]   MEDICAL RECORD NO.:  [id number]          PATIENT TYPE:  AMB   LOCATION:  SDS                          FACILITY:  [facility]   PHYSICIAN:  Ines Bloomer, M.D. DATE OF BIRTH:  10-16-1945   DATE OF PROCEDURE:  12/11/2008  DATE OF DISCHARGE:  12/11/2008                               OPERATIVE REPORT   PREOPERATIVE DIAGNOSIS:  Stage IIIA non-small cell lung cancer, right  upper lobe.   POSTOPERATIVE DIAGNOSIS:  Stage IIIA non-small cell lung cancer, right  upper lobe.   OPERATION:  Insertion of left subclavian Port-A-Cath.   SURGEON:  Ines Bloomer, MD   FIRST ASSISTANT:  None.   ANESTHESIA:  IV sedation and 1% Xylocaine.   PROCEDURE IN DETAIL:  After prep and draping the left chest, the area  was infiltrated with 1% Xylocaine below the left clavicle  infraclavicularly and a left subclavian puncture was performed.  The  guidewire threaded under fluoro to the right atrium.  Another area was  infiltrated with 1% Xylocaine.  Inferior to this, a transverse incision  was made and a Port-A-Cath pocket was dissected out down to the pectoral  fascia.  A 9.6 pre-attached Port-A-Cath was inserted into the pocket and  sutured in place with 2-0 silk and the tubing was tunneled from the  pocket to a stab wound around the guidewire.  The Port-A-Cath was cut to  reach to the right atrial SVC junction and cut and over the guidewire  was passed a dilator with a peel-away sheath.  The dilator and guidewire  were removed and the tubing passed through the peel-away sheath.  The  peel-away sheath was removed and was confirmed to be in the proximal  right atrium near the SVC junction by fluoro.  Wounds were closed with 3-  0 Vicryl in the subcutaneous tissue and Dermabond for the skin.  Since  the patient will be given chemotherapy tomorrow, the Port-A-Cath was  left cannulated.  The patient was returned to the recovery room in  stable  condition.      Ines Bloomer, M.D.  Electronically Signed     DPB/MEDQ  D:  12/11/2008  T:  12/12/2008  Job:  [id number]

## 2011-02-18 NOTE — Letter (Signed)
November 21, 2009   Velora Heckler. [name], MD  [address]  [place], [place] [address]   Re:  [name], [name]                DOB:  02-Mar-1946   Dear [name],   I saw the patient back in the office today.  She is still having some  right anterior chest wall pain starting in her axilla and radiating to  her right breast.  Her CT scan shows slightly enlarged precarinal node  or 4R but not really that much of a change.  She does have changes from  her radiation.  She could not take the Neurontin, so I switched her to  Lyrica, and hopefully this will help her pain.  I will see her back  again in 1 month for followup.  Her blood pressure is 129/87, pulse 100,  respirations 18, sats were 94%.   Sincerely,   Ines Bloomer, M.D.  Electronically Signed   DPB/MEDQ  D:  11/21/2009  T:  11/22/2009  Job:  [id number]

## 2011-02-18 NOTE — Op Note (Signed)
[name], [name]                ACCOUNT NO.:  [id number]   MEDICAL RECORD NO.:  [id number]          PATIENT TYPE:  INP   LOCATION:  2310                         FACILITY:  [facility]   PHYSICIAN:  Suzanna Obey, M.D.       DATE OF BIRTH:  05-Mar-1946   DATE OF PROCEDURE:  10/12/2008  DATE OF DISCHARGE:                               OPERATIVE REPORT   PREOPERATIVE DIAGNOSIS:  Left nasal lesion.   POSTOPERATIVE DIAGNOSIS:  Left nasal lesion.   SURGICAL PROCEDURE:  Excision of left nasal lesion.   ANESTHESIA:  General.   ESTIMATED BLOOD LOSS:  Approximately 5 mL.   INDICATIONS:  A 65 year old who has had a mass in the nasal vestibule on  the left side superiorly that seems to have slowly enlarged.  It does  not cause her any discomfort.  She had another area on the outer part of  her nose that was bleeding but that has stopped.  She had no bleeding  from the inside.  This mass is firm and CT scan did not indicate any  extension or crossing of this tumor or connection of the tumor to the  skin.  She is informed of the risks and benefits of the procedure and  options were discussed.  All questions were answered and consent was  obtained.   OPERATION:  The patient was taken to the operating room, placed in  supine position after general endotracheal tube anesthesia was examined  with the nasal speculum.  The inside of the left superior vestibule was  injected with 1% lidocaine with 1:100,000 epinephrine around its mass.  An elliptical incision was made around it, was dissected down to the  alar cartilage and removed.  There was some bleeding and it did seems to  be somewhat vascular, but the lesion was firm, nothing that indicated a  hemangioma.  The mass was sent for pathology and with a small amount of  cautery.  The hemostasis was achieved and closed with interrupted 4-0  chromic.  She had no bleeding at that point and the operation was turned  over to Dr. Edwyna Shell to be dictated in a  separate operative report.           ______________________________  Suzanna Obey, M.D.     JB/MEDQ  D:  10/12/2008  T:  10/12/2008  Job:  [id number]

## 2011-02-18 NOTE — Discharge Summary (Signed)
NAME:  [name], [name] NO.:  [id number]   MEDICAL RECORD NO.:  [id number]           PATIENT TYPE:   LOCATION:                                 FACILITY:   PHYSICIAN:  Rollene Rotunda, MD, FACCDATE OF BIRTH:  Sep 12, 1946   DATE OF ADMISSION:  DATE OF DISCHARGE:                               DISCHARGE SUMMARY   PRIMARY CARDIOLOGIST:  Rollene Rotunda, MD, [facility]   PRIMARY CARE PHYSICIAN:  Angus G. Renard Matter, MD   CONSULTING PHYSICIANS.:  1. Venita Lick. Russella Dar, MD, [facility], GI  2. Oretha Milch, MD  3. Interventional Radiology.   PROCEDURES PERFORMED DURING HOSPITALIZATION:  1. EGD.  2. Bronchoscopy.   FINAL DISCHARGE DIAGNOSES:  1. Chest pain.      a.     Noncardiac.  2. Known history of DES stenting of the right coronary artery in 2005      with last catheterization in May 2008 negative for obstructive      coronary artery disease.  3. Right upper lobe lung mass, newly diagnosed.  4. Chronic obstructive pulmonary disease.  5. Diabetes mellitus.  6. Hypertension.  7. Gastritis with gastroesophageal reflux disease.   HOSPITAL COURSE:  This is a 65 year old Caucasian female with a history  of esophageal disease and coronary artery disease, who [facility] to the  [facility] after 2 days of indigestion and chest discomfort,  retrosternal and burning in description.  The patient states that it was  constant for 2 days prior to admission and intensified and she took  nitroglycerin prior to coming to the emergency room.  The patient was  seen and examined by the physicians at [facility] and  transferred to [facility] secondary to recurrent chest discomfort in the  setting of known CAD.   On evaluation, the patient was seen and examined by Dr. Oneita Hurt,  fellow for Dr. Rollene Rotunda.  The patient was started on nitroglycerin  and morphine, and cardiac enzymes were cycled to rule out myocardial  infarction.   The patient was seen and examined the  following day by Dr. Antoine Poche and  the patient had subsequent CT scan of the chest which was completed  secondary to the chest discomfort, revealing abnormal with airspace  opacity to the anterior aspect of the right upper lobe with right hilar  lymphadenopathy.  The patient had marked thickening of the wall of the  esophagus distally due to inflammatory changes.  The patient also had a  lung mass that could be due to pneumonia associated with reactive  adenopathy, although centrally obstructing lesion could not be  completely excluded.  There was no aortic dissection found.   The patient's cardiac enzymes were found to be negative along with EKG.  The patient has a history of smoking and with increased complaints of  shortness of breath, pulmonary critical care consult was obtained.  The  patient had a subsequent bronchoscopy completed secondary to evidence of  right upper lobe lung mass noted on the CT scan and this was completed  by Dr. Erik Obey, critical care pulmonary.  Endoscopic  cytology brushing was  completed with results pending.   The patient also underwent an EGD on July 12, 2008, by GI secondary to  epigastric pain and known history of esophageal gastritis.  The  patient's endoscopy revealed gastritis and esophageal thickening along  with GERD.  The patient was started on Nexium 40 mg b.i.d. during  hospitalization.   The patient was seen by Interventional Radiology on July 13, 2008,  secondary to lung mass seen on CT scan and was reviewed by Dr. Deanne Coffer.  The patient is to be scheduled for an outpatient lung biopsy through  Interventional Radiology secondary to suspicious cells seen on  bronchoscopy brushings.   The patient was seen and examined by Dr. Rollene Rotunda on the day of  discharge and it was found that the pain that she experienced was  noncardiac in etiology.  In the interim, the patient has been started on  proton pump inhibitor b.i.d. along with Carafate  slurry.  She has been  started on bronchodilators along with Advair during hospitalization and  will continue this on discharge.  The patient will have Plavix  discontinued 10 days prior to biopsy which has been scheduled for  July 24, 2008, through Interventional Radiology.  The patient has  been advised of this.  In the interim, the patient will continue current  medication regimen with the addition of the proton pump inhibitor and  Carafate and bronchodilators.  She will follow up with Dr. Antoine Poche in  approximately 6 weeks and follow up with Interventional Radiology,  Pulmonology, GI, and also her primary care physician as an outpatient.   DISCHARGE LABORATORY DATA:  Sodium 133, potassium 3.5, chloride 102, CO2  of 25, glucose 164, BUN 8, and creatinine 0.63.  Hemoglobin 13.7,  hematocrit 40.8, white blood cells 5.9, and platelets 115.  Hemoglobin  A1c 8.5.  PTT 29, PT 12.9, and INR 1.0.  Cardiac enzymes 0.01, 0.01, and  0.01 respectively.  Chest x-ray on July 09, 2008, revealed bilateral  perihilar and bibasilar interstitial infiltrates or edema.  CT scan as  discussed above, there was diffuse ground-glass attenuation present  bilaterally.  There was no evidence of pulmonary emboli identified.  There was a right hilar lymphadenopathy with a 1.8 x 15 cm lymph node  identified.   DISCHARGE VITAL SIGNS:  Blood pressure 139/69, pulse 52, temperature  96.6, and O2 sat 98% on 2 liters.  The patient's sat 94% off oxygen.   DISCHARGE MEDICATIONS:  1. Aspirin 325 mg daily.  2. Vytorin 10/40 daily.  3. Toprol-XL 25 mg daily.  4. Lotensin 10 mg daily.  5. Nicotine patch 21 mg daily topically.  6. Albuterol 2.5 mg inhaler q.6 h.  7. Atrovent 0.5 mg every 6 hours.  8. Guaifenesin 1200 mg twice a day.  9. Advair 250/50 Diskus inhale twice a day.  10.Nexium 40 mg twice a day.  11.Evista 60 mg daily.  12.Lasix 20 mg daily.  13.Lantus insulin 50 units in a.m. and 50 units in the p.m.   14.Apidra 10-20 units 3 times a day.  15.Plavix 75 mg daily (to be held from July 17, 2008, to      postbiopsy).  16.Carafate 1 g 4 times a day.   ALLERGIES:  NIACIN.  She is intolerant to CONTRAST MEDIA and MILK.   FOLLOWUP PLANS AND APPOINTMENT:  1. The patient is scheduled on July 24, 2008, at 9:30 a.m. for a      lung biopsy and Plavix is to  be held until after biopsy completed.  2. She has a followup appointment with Dr. Vassie Loll on August 01, 2008,      at 10:45 a.m. postbiopsy for further discussion of right upper lobe      lung mass.  3. The patient has an appointment with Dr. Jena Gauss on August 03, 2008,      at 9:00 a.m. GI for post-EGD followup for gastritis and GERD.  4. The patient has a followup with Dr. Antoine Poche on August 11, 2008,      at 9:15 a.m. for continued cardiac management.  5. The patient is to follow up with her primary care physician, Dr.      Renard Matter postdischarge for continued medical management.  6. The patient has been advised to hold her Plavix after her dose on      July 16, 2008, until after biopsy is completed and to return to      Plavix with biopsy instructions per Interventional Radiology.   TIME SPENT WITH THE PATIENT TO INCLUDE PHYSICIAN TIME:  1 hour.      Bettey Mare. Lyman Bishop, NP      Rollene Rotunda, MD, [facility]  Electronically Signed    KML/MEDQ  D:  07/14/2008  T:  07/14/2008  Job:  [id number]   cc:   Interventional Radiology  Oretha Milch, MD  R. Roetta Sessions, M.D.  Angus G. Renard Matter, MD

## 2011-02-18 NOTE — Assessment & Plan Note (Signed)
[facility]                            CARDIOLOGY OFFICE NOTE   GAO, MITNICK                       MRN:          [id number]  DATE:05/25/2008                            DOB:          19-Jan-1946    PRIMARY CARE PHYSICIAN:  Angus G. McInnis, MD   REASON FOR PRESENTATION:  Evaluate the patient with coronary disease and  chest pain.   HISTORY OF PRESENT ILLNESS:  The patient is 65 years old now.  She  presents for her 87-month followup.  However, she has been having  increased chest discomfort.  She reports that this happens sporadically  but also was fairly reproducible when she is doing something such as  pushing her husband's wheelchair somewhat quickly.  She will get some  burning chest discomfort.  She might have to stop and rub her chest.  She might get a little shoulder discomfort on the left.  She says it may  last 10-15 minutes.  She might get slightly nauseated.  At other time,  she has some shooting pains without exertion.  She has not taken any  nitroglycerin.  She simply stops what she is doing and it goes away.  She does get somewhat short of breath and that has been a chronic  problem.  She does not have any PND or orthopnea.  She does not describe  any palpitations, presyncope, or syncope.   PAST MEDICAL HISTORY:  1. Coronary artery disease (the patient had stenting of the mid      segment in the right coronary artery in 2005 with a Cypher stent.      Last catheterization in 2008 demonstrated the EF to be 60%.  The      right coronary artery had a patent stent.  The left main was      normal.  The circumflex had luminal irregularities.  The LAD had      luminal irregularities).  2. Dyslipidemia.  3. Ongoing tobacco abuse.  4. Hypertension.  5. Diabetes mellitus.  6. Chronic obstructive pulmonary disease.  7. Obesity.  8. Total knee replacement.  9. Back surgery x2.  10.Tonsillectomy.  11.Tubal ligation.  12.Appendectomy.  13.Hysterectomy.  14.Oophorectomy.  15.Carpal tunnel surgery.   ALLERGIES:  NIASPAN, the patient also react to CHANTIX.   MEDICATIONS:  1. Vytorin 10/40.  2. Omeprazole 20 mg b.i.d.  3. Evista 60 mg daily.  4. Furosemide 20 mg daily.  5. Metoprolol 25 mg.  6. Plavix 75 mg daily.  7. Benazepril 10 mg daily.  8. Aspirin 81 mg daily.  9. Lantus 100 units b.i.d.  10.Apidra.   REVIEW OF SYSTEMS:  As stated in the HPI and otherwise negative for all  other systems.   PHYSICAL EXAMINATION:  GENERAL:  The patient is in no distress.  VITAL SIGNS:  Blood pressure 123/67, heart rate 63 and regular, weight  210 pounds, body mass index 38.5.  HEENT:  Eyelids unremarkable, pupils equal, round, and reactive to  light, fundi not visualized, oral mucosa unremarkable.  NECK:  No jugular venous distention at 45 degrees.  Carotid upstroke  brisk and symmetrical.  No bruits, no thyromegaly.  LYMPHATICS:  No cervical, axillary, or inguinal adenopathy.  LUNGS:  Clear to auscultation bilaterally.  BACK:  No costovertebral angle tenderness.  CHEST:  Unremarkable.  HEART:  PMI not displaced or sustained, S1 and S2 within normal limits,  no S3, no S4, no clicks, no rubs, and no murmurs.  ABDOMEN:  Obese, positive bowel sounds, and normal in frequency and  pitch.  No bruits, no rebound, no guarding, no midline pulsatile mass,  no hepatomegaly, and no splenomegaly.  SKIN:  No rashes, no nodules.  EXTREMITIES:  2+ pulses throughout.  No edema.  No cyanosis or clubbing.  NEUROLOGIC:  Oriented to person, place, and time.  Cranial nerves II  through XII grossly intact, motor grossly intact.   EKG, sinus rhythm, rate 73, left axis deviation, left anterior  fascicular block, anterolateral T-wave inversions unchanged from  previous EKGs.   ASSESSMENT/PLAN:  1. Chest, the patient is having more chest discomfort consistent with      possible angina.  She has known coronary disease and ongoing risk       factors.  Given this, screening with a stress test would be      indicated.  She would not be able to walk on a treadmill, so she      will have an adenosine Cardiolite.  Further evaluation based on      these results.  2. Diabetes.  She is encouraged to continue to follow up with her      primary care doctor.  Dr. Renard Matter have suggested possibly an      endocrinologist and I will defer to him.  She says her blood sugar      is not well-controlled.  3. Dyslipidemia.  I would suggest an LDL less than 70 and HDL greater      than 50 as goals.  4. Tobacco.  We talked about the need to stop smoking (greater than 3      minutes).  At this point, she is to stressed to think about this.      She failed Chantix.  She has had problems with the nicotine patches      in the past.  We will talk about this again over the subsequent      visits.  5. Hypertension.  Blood pressure is controlled.  She will continue the      medications as listed.  6. Obesity.  She understands the need to lose weight with diet and      exercise and we will tackle      these problems going forward.  7. Followup.  I will see her back in 6 months or sooner if needed.     Rollene Rotunda, MD, [facility]  Electronically Signed    JH/MedQ  DD: 05/25/2008  DT: 05/26/2008  Job #: [id number]   cc:   Angus G. Renard Matter, MD

## 2011-02-18 NOTE — Assessment & Plan Note (Signed)
[facility]                            CARDIOLOGY OFFICE NOTE   HOLLYANN, PABLO                       MRN:          [id number]  DATE:11/30/2008                            DOB:          1946-05-23    PRIMARY CARE PHYSICIAN:  Angus G. McInnis, MD   REASON FOR PRESENTATION:  Evaluate the patient with coronary disease and  chest pain.   HISTORY OF PRESENT ILLNESS:  The patient is now 65 years old.  She  presents for evaluation of the above.  Since I last saw her she has had  resection of her right upper lobe lymph node for treatment of  adenocarcinoma.  This was in January.  She stopped smoking at this time.  She had no apparent complications with this.  She is now undergoing  chemotherapy with cisplatinum, Taxotere, and pegfilgrastim.  She has  been very nauseated and very sick since taking this first round.  She  has been vomiting and had diarrhea.  She has stopped her furosemide.  In  fact, she is getting hydration with IV continuously now.  Since her  chemotherapy, she has been having some chest burning.  This had been  constant or waxes and wanes.  It has been quite severe.  It is not like  previous angina.  She is finally taking Nexium 40 mg b.i.d. with some  improvement.  She did have 1 episode a few days ago of different type of  chest discomfort that was more pressure discomfort and similar to  angina.  For this she took 1 nitroglycerin and it seemed to ease, that  the burning was residual.  She has not had any of the discomfort up into  her jaw or down into her arms.  She has not had any new shortness of  breath.  She is chronically short of breath now.  She has had no PND or  orthopnea.  She had no palpitations, presyncope, or syncope.   PAST MEDICAL HISTORY:  Coronary artery disease (The patient had stenting  of the midsegment of the right coronary artery in 2005 with the Cypher  stent.  The last catheterization demonstrated an EF of  60%.  The right  coronary artery stent was patent.  The left main was normal.  Circumflex  had luminal irregularities.  The LAD had luminal irregularities.),  adenocarcinoma of the wall, dyslipidemia, esophageal stricture status  post stretching (she now has some apparent thickening of her esophagus  and is to see a gastroenterologist for this), previous tobacco abuse,  hypertension, diabetes mellitus, chronic obstructive pulmonary disease,  obesity, total knee replacement, back surgery x2, tonsillectomy, tubal  ligation, appendectomy, hysterectomy, oophorectomy, and carpal tunnel  surgery.   ALLERGIES/INTOLERANCE:  NIASPAN and CHANTIX.   MEDICATIONS:  1. Vytorin 10/40.  2. Evista 60 mg daily.  3. Toprol 25 mg daily.  4. Plavix 75 mg daily.  5. Benazepril 10 mg daily.  6. Aspirin 81 mg daily.  7. Apidra t.i.d.  8. Lantus.  9. Nicotine patch 7 mg.  10.Nexium 40 mg b.i.d.   REVIEW OF SYSTEMS:  As stated in the HPI and otherwise negative for  other systems.   PHYSICAL EXAMINATION:  GENERAL:  The patient is in no distress.  VITAL SIGNS:  Blood pressure 109/71, heart rate 88 and regular, weight  202 pounds.  HEENT:  Eyelids unremarkable.  Pupils equal, round, and reactive to  light.  Fundi not visualized.  Oral mucosa unremarkable.  NECK:  No jugular venous distention at 45 degrees.  Carotid upstroke,  brisk and symmetric.  No bruits, no thyromegaly.  LYMPHATICS:  No cervical, axillary, or inguinal adenopathy.  LUNGS:  Clear to auscultation bilaterally.  BACK:  No costovertebral angle tenderness.  CHEST:  Unremarkable.  HEART:  PMI not displaced or sustained.  S1 and S2 within normal limits.  No S3, no S4.  No clicks, no rubs, no murmurs.  ABDOMEN:  Obese.  Positive bowel sounds.  Normal in frequency and pitch.  No bruits.  No rebound.  No guarding.  No midline pulsatile mass.  No  hepatomegaly.  No splenomegaly.  SKIN:  No rashes, no nodules.  EXTREMITIES:  Pulses 2+, no  edema.  NEUROLOGIC:  Oriented to person, place, and time.  Cranial nerves II  through XII grossly intact.  Motor grossly intact.   EKG sinus rhythm, rates 88, left axis deviation, left ventricular  hypertrophy by voltage criteria, no acute ST-T wave changes, QT is  prolonged.   ASSESSMENT AND PLAN:  1. Chest discomfort.  The patient's chest discomfort is very atypical.      She has no object evidence of ischemia.  At this point, I do not      think there is any active ischemic etiology.  It is reasonable for      her to take a sublingual nitroglycerin if this helps with the pain,      though.  It could be coronary spasm.  Smaller chance for      obstructive disease, I think we should manage medically.  Finally,      this could be something like esophageal spasm, which might improve      with nitroglycerin.  At this point, I do not think further      cardiovascular testing is suggested, however.  2. Dyslipidemia.  We can follow this up in the future.  For now, she      is somewhat malnourished.  For now, she is not eating and so, I      would not be aggressive with her lipid-lowering medication.  She      has enough to content with right now.  3. Followup.  I would like to see her back in about 6 months or sooner      if she has any increasing pain.  If she starts needing to take more      nitroglycerin she needs to let me know.    Rollene Rotunda, MD, [facility]  Electronically Signed   JH/MedQ  DD: 11/30/2008  DT: 12/01/2008  Job #: [id number]   cc:   Angus G. Renard Matter, MD

## 2011-02-18 NOTE — Consult Note (Signed)
[name], [name]                ACCOUNT NO.:  [id number]   MEDICAL RECORD NO.:  [id number]          PATIENT TYPE:  INP   LOCATION:  2310                         FACILITY:  [facility]   PHYSICIAN:  Coralyn Helling, MD        DATE OF BIRTH:  09/30/1946   DATE OF CONSULTATION:  10/12/2008  DATE OF DISCHARGE:                                 CONSULTATION   REFERRING PHYSICIAN:  Ines Bloomer, MD   [name] is a 65 year old female who was admitted to the hospital on  July 09, 2008, for evaluation of chest pain.  At that time, she had a  chest x-ray, which showed a right upper lobe mass lesion.  She had  undergone bronchoscopy, which showed atypical cells but was  nondiagnostic for malignancy.  She then underwent a CT-guided biopsy,  which again showed atypical cells, but was nondiagnostic.  She had a PET  scan done on August 25, 2008, which is strongly positive for malignant  uptake in the right upper lobe region and as a result, she was referred  to Dr. Edwyna Shell for evaluation for resection of this lesion.  She  underwent right video-assisted thoracoscopy with right upper lobectomy  earlier in the day and she is currently in the recovery room.  Her  surgery was uneventful and she was extubated without difficulty  postoperatively.  She has expected pain related to her surgical site,  but this is well controlled on patient-controlled analgesia.  She  otherwise denies any difficulty with breathing, nausea, or abdominal  pain.   PAST MEDICAL HISTORY:  Significant for:  1. COPD with a baseline FEV-1 of 1.78 liters, which was 78% predicted      and diffusion capacity which was 62% predicted.  2. Dyslipidemia.  3. Coronary disease, status post stenting.  4. Hiatal hernia.  5. Diabetes mellitus.  6. Osteoarthritis.  7. Esophageal web, status post dilation.   PAST SURGICAL HISTORY:  Significant for:  1. C5-C6 diskectomy in 2003.  2. L4-L5 laminectomy in 2004.  3. Left total knee  replacement in 2002.  4. Right knee arthroscopy.  5. Tonsillectomy.  6. Cholecystectomy.  7. Appendectomy.  8. Total abdominal hysterectomy with bilateral salpingo-oophorectomy.  9. Carpal tunnel repair.   ALLERGIES:  She has allergies to NIACIN and IV CONTRAST DYE.   MEDICATIONS:  1. Aspirin.  2. Vytorin 10/40 one daily.  3. Toprol-XL 25 mg daily.  4. Lotensin 10 mg daily.  5. Albuterol nebulizer q.i.d. p.r.n.  6. Atrovent nebulizer q.i.d. p.r.n.  7. Guaifenesin p.r.n.  8. Advair 250/50 one puffs b.i.d.  9. Nexium 40 mg daily.  10.Evista 60 mg daily.  11.Lasix 20 mg daily.  12.Lantus 50 units t.i.d.  13.Plavix 75 mg daily.  14.Carafate 1 g q.i.d.   SOCIAL HISTORY:  She is married.  She is disabled.  She has five  children, two of which are adopted.  She smoked four packs of cigarettes  a day and quit approximately 2 months ago.  There is no significant  history of alcohol abuse.   FAMILY HISTORY:  Significant  for her mother who had hypertension,  diabetes, heart failure, throat cancer, and cervical cancer; and her  father who had heart failure, COPD, and stroke.   PHYSICAL EXAMINATION:  GENERAL:  She is seen in the recovery room.  She  is somnolent, but arousable and able to follow commands, does not appear  to be in acute distress.  VITAL SIGNS:  Blood pressure 162/58, heart rate 72, respiratory 24, and  oxygen saturation 95%.  She has a left subclavian central venous  catheter in place and a right radial arterial line in place.  HEENT:  Pupils are reactive.  There is no sinus tenderness.  There are  no oral lesions.  NECK:  There is no lymphadenopathy or thyromegaly.  HEART:  S1 and S2.  No murmur.  CHEST:  She has right chest tube in place.  She has breath sounds that  are heard bilaterally.  There is no wheezing.  ABDOMEN:  Obese, soft, and nontender.  EXTREMITIES:  There is no edema, cyanosis, or clubbing.  NEUROLOGIC:  She is able to move all four  extremities.   LABORATORY DATA:  Arterial blood gas showed a pH of 7.38, PCO2 of 40,  PO2 of 68.  Hemoglobin is 15, hematocrit is 45, WBC is 7.5, platelet  count is 138.  Sodium is 136, potassium is 4.1, chloride is 108, CO2 is  22, BUN is 8, creatinine is 3.6, and glucose is 238.  PT is 13, INR is  1, PTT is 27.  AST is 18, ALT is 17, ALP is 94, bilirubin 0.8, albumin  is 3.6, calcium is 8.7.   IMPRESSION:  1. Right upper lobe nodule with abnormal uptake on PET scan with      previous biopsy showing atypical cells.  Again, this is very      suggestive of malignancy and as a result, she had undergone right      upper lobectomy for both diagnostic and therapeutic intervention.      She is to continue on patient-controlled analgesia per Thoracic      Surgery and chest tube will be managed by Thoracic Surgery.  She      has an order for a followup chest x-ray in the morning.  2. Chronic obstructive pulmonary disease.  I will continue her on      scheduled nebulizer therapy for the time being and then when she is      more clinically stable, convert back to her home regimen with      Advair.  3. History of coronary artery disease with dyslipidemia.  She is to      continue on her outpatient regimen of aspirin,      Vytorin, Toprol, and Lotensin.  4. Hiatal hernia with gastroesophageal reflux disease.  She is to      continue on Carafate and Nexium.  5. Diabetes mellitus.  She is to continue on Lantus.      Coralyn Helling, MD  Electronically Signed     VS/MEDQ  D:  10/12/2008  T:  10/13/2008  Job:  [id number]   cc:   Angus G. Renard Matter, MD  Rollene Rotunda, MD, [facility]  Oretha Milch, MD

## 2011-02-18 NOTE — H&P (Signed)
NAME:  [name], [name] NO.:  [id number]   MEDICAL RECORD NO.:  [id number]          PATIENT TYPE:  INP   LOCATION:  3737                         FACILITY:  [facility]   PHYSICIAN:  Christell Faith, MD   DATE OF BIRTH:  [birth year]   DATE OF ADMISSION:  07/09/2008  DATE OF DISCHARGE:                              HISTORY & PHYSICAL   PRIMARY CARDIOLOGIST:  Rollene Rotunda, MD, [facility]   PRIMARY CARE PHYSICIAN:  Angus G. Renard Matter, MD   CHIEF COMPLAINT:  Chest pain.   HISTORY OF PRESENT ILLNESS:  This is a 65 year old white female with a  history of both esophageal and coronary artery disease, who presented to  [facility] earlier today after 2 days of what felt like bad  indigestion.  It is a retrosternal burning which starts in her  epigastrium and moves cephalad.  It has been nearly constant for 2 days.  Today, it intensified and so she took nitroglycerin which provided some  relief.  She describes the current pain as a constant ache.  It radiates  between her shoulder blades and she rates it 8/10.  She denies nausea,  diaphoresis.  She denies recent fever, cold, or flu symptoms.  Of note,  she saw Dr. Antoine Poche in clinic last month and underwent an adenosine  Myoview which was reportedly negative for ischemia.   PAST MEDICAL HISTORY:  1. Coronary artery disease status post Cypher stenting to the right      coronary artery in 2005.  Last catheterization was in May 2008      which was negative for obstructive coronary artery disease.  2. Diabetes mellitus type 2.  3. COPD.  4. Hypertension.  5. Status post upper and lower endoscopy in 2007, which was positive      for cervical esophageal web requiring dilatation.  6. History of multiple surgeries including hysterectomy,      cholecystectomy, appendectomy, back surgeries, and knee      replacements.   SOCIAL HISTORY:  [place].  She smokes one and half packs of  cigarettes a day.  No alcohol.  No drug  use.   FAMILY HISTORY:  Mother is alive and has survived throat cancer and  cervical cancer.  She has questionable history of congestive heart  failure.  Father is deceased and had congestive heart failure and COPD.   ALLERGIES:  NIASPAN and CHANTIX.   MEDICATIONS:  1. Vytorin 10/40 mg p.o. daily.  2. Aspirin 81 mg p.o. daily.  3. Plavix 75 mg p.o. daily.  4. Toprol-XL 25 mg p.o. daily.  5. Lantus 90 units subcu nightly.  6. Benazepril 10 mg p.o. daily.  7. Nexium 40 mg p.o. b.i.d.  8. Apidra insulin sliding scale as needed.  9. Lasix 20 mg p.o. p.r.n.  10.Evista as directed.   REVIEW OF SYSTEMS:  As per HPI.  In addition, positive for coughing and  dyspnea on exertion, which are chronic; low back pain which is chronic;  and chronic acid reflux.   PHYSICAL EXAMINATION:  VITAL SIGNS:  Temperature not yet documented,  pulse 63, respiratory rate 18, blood pressure currently 184/100, when  she left [facility] it was 110/57, pulse 63, respiratory rate  18, saturation 96% on room air.  GENERAL:  This is a white female, who is obese and appears older than  her stated age.  She appears mildly uncomfortable.  HEENT:  Pupils are equal, round, and reactive.  Sclerae clear.  Mucous  membranes are moist.  No oral lesions.  NECK:  Supple.  Neck veins are flat.  No carotid bruits.  No  thyromegaly.  No cervical adenopathy.  CARDIAC:  Normal rate.  Regular rhythm.  Second heart sound is  physiologically split.  There is a 2/6 systolic ejection murmur at the  base of the heart consistent with aortic sclerosis.  She has 2+ dorsalis  pedis pulses bilaterally, 2+ radial pulses bilaterally, 2+ posterior  tibialis pulses bilaterally.  LUNGS:  Mostly clear with scattered rales in the right base which mostly  clear with coughing.  ABDOMEN:  Obese, soft, nontender, nondistended.  EXTREMITIES:  No edema.  No rash.  MUSCULOSKELETAL:  No acute joint  effusions or deformities.  NEUROLOGIC:  Facial expressions are symmetric.  5/5 strength in all four  extremities.  Sensation appears intact.   DIAGNOSTIC TESTS:  Chest x-ray shows bilateral perihilar and bibasilar  interstitial infiltrates.  EKG sinus rhythm rate of 63 beats per minute,  left anterior fascicular block, nonspecific T-wave flattening, unchanged  from prior.   LABS:  White blood cells 7, hemoglobin 15.9, platelets 147.  Sodium 140,  potassium 3.9, BUN 12, creatinine 0.7, BNP less than 30, CK-MB 1.1,  troponin less than 0.05.   IMPRESSION:  This is 65 year old white female with chest pain which  initially felt like bad indigestion, but now feels more like her prior  angina.  She is currently hypertensive with 8/10 pain.  Negative EKG.  Negative cardiac enzymes.   PLAN:  1. Admit to telemetry, rule out myocardial infarction by cycling      serial EKGs and cardiac enzymes.  2. Continue aspirin, heparin drip, Toprol-XL, ACE inhibitor, and      statin.  3. We will increase her nitroglycerin drip to maintain control of her      blood pressure and chest pain.  We will use p.r.n. morphine as      needed.  4. She will be made n.p.o. after midnight for probable catheterization      in the morning.  5. Check TSH.  6. Blood sugar control with Lantus and sliding scale insulin.  7. I will have the nurse check blood pressure in both upper      extremities and call me with the results.  My clinical suspicion      for aortic dissection is low, however, if her blood pressure      remains elevated and her pain progresses,      we will consider CT angiogram to rule out acute aortic syndrome.  8. Continue her b.i.d. acid blocker and if this proves not to be      cardiac problem, its most likely recurrence of her esophageal      disease.      Christell Faith, MD  Electronically Signed     NDL/MEDQ  D:  07/09/2008  T:  07/10/2008  Job:  [id number]

## 2011-02-18 NOTE — Op Note (Signed)
[name], [name]                ACCOUNT NO.:  [id number]   MEDICAL RECORD NO.:  [id number]           PATIENT TYPE:   LOCATION:                                 FACILITY:   PHYSICIAN:  Ines Bloomer, M.D. DATE OF BIRTH:  09/17/1946   DATE OF PROCEDURE:  DATE OF DISCHARGE:  10/19/2008                               OPERATIVE REPORT   PREOPERATIVE DIAGNOSIS:  Right upper lobe mass.   POSTOPERATIVE DIAGNOSIS:  Non-small cell lung cancer, right upper lobe.   OPERATION PERFORMED:  Right VATS, right thoracotomy, and right upper  lobectomy.   SURGEON:  Ines Bloomer, MD   FIRST ASSISTANT:  Coral Ceo,  PA-C.   PROCEDURE:  After percutaneous insertion of all monitoring lines, the  patient underwent general anesthesia, was prepped and draped in the  usual sterile manner.  Dual-lumen tube was inserted.  Dr. Jearld Fenton saw the  patient first and biopsied the nasal lesion, and then the patient was  turned to the right lateral thoracotomy position.  The right lung was  deflated.  He was prepped and draped in the usual sterile manner.  Trocar sites were made in the anterior axillary lines at the seventh  intercostal space.  A 0-degree scope was inserted and the cancer could  be seen in the right upper lobe in the anterior segment and was very  close to the bronchus.  It was elected to do a thoracotomy on her.  A  posterolateral thoracotomy was done and this was divided.  The  latissimus was partially divided.  The serratus was reflected  anteriorly.  The fifth intercostal space was entered and the lesion was  palpated in the anterior segment of the right upper lobe and looked like  we could get margins.  Dissection was started superiorly dissecting out  two 4R nodes and then dissecting down the apical posterior branch, was  dissected out and looped and divided with an Autosuture 32-mm stapler.  The superior pulmonary vein was dissected out, stapled and divided with  an Autosuture 30-mm  stapler.  There were several large 10R nodes, which  were dissected free and then dissection was turned posteriorly  dissecting up the 11R nodes posteriorly.  The fascia was entered, and  the major portion of the fascia was divided with the Autosuture green 60  stapler then the minor fascia was likewise divided with the Autosuture  green 60 stapler.  The ascending branch to the right upper lobe was  stapled and divided with the Autosuture 2-mm gray stapler.  Finally, the  bronchus was divided with the Autosuture 45-mm green stapler and frozen  section revealed that the bronchial margin was negative, but then it was  a non-small cell lung cancer.  The inferior pulmonary ligament was taken  down with electrocautery.  FloSeal was applied to the staple and 2 chest  tubes were brought in, one through the trocar site and one through  another stab wound posteriorly, were sutured in placed with 0 silk.  A  single On-Q was inserted and through a separate stab wound, Marcaine  block  was done in the  usual fashion.  The chest was closed with 4 pericostal drilling through  the sixth rib and passing around the fifth rib, #1 Vicryl in the muscle  layer, 2-0 Vicryl in the subcutaneous tissue, and Dermabond for the  skin.  The patient was returned to the recovery room in stable  condition.      Ines Bloomer, M.D.  Electronically Signed     DPB/MEDQ  D:  10/12/2008  T:  10/13/2008  Job:  [id number]

## 2011-02-18 NOTE — Assessment & Plan Note (Signed)
OFFICE VISIT   [name], [name]  DOB:  1946-03-30                                        Feb 28, 2009  CHART #:  [id number]   The patient came today.  She has completed her chemotherapy and will be  starting radiation.  Chest x-ray is stable, showed normal postoperative  changes.  She had a recent CAT scan, that also looked good and no  evidence of recurrence of her cancer.  We plan to see her again in 3  months with a chest x-ray.  Her lungs are clear to auscultation and  percussion.   Ines Bloomer, M.D.  Electronically Signed   DPB/MEDQ  D:  02/28/2009  T:  03/01/2009  Job:  [id number]

## 2011-02-18 NOTE — H&P (Signed)
NAME:  [name], [name]                ACCOUNT NO.:  [id number]   MEDICAL RECORD NO.:  [id number]          PATIENT TYPE:  INP   LOCATION:                               FACILITY:  [facility]   PHYSICIAN:  Ines Bloomer, M.D. DATE OF BIRTH:  05/17/1946   DATE OF ADMISSION:  10/12/2008  DATE OF DISCHARGE:                              HISTORY & PHYSICAL   CHIEF COMPLAINT:  Right upper lobe lung mass.   HISTORY OF PRESENT ILLNESS:  This 65 year old patient is the wife of the  patient, [name], that had a recurrent esophageal cancer.  She  developed some chest pain, was admitted to [facility], and had  a workup of the right upper lobe lesion.  She had a previous right  coronary artery stent in 2005, and she had had some chest pain and  retrosternal indigestion.  The cardiac workup was negative.  She  underwent a bronchoscopy, which showed atypical cells, and a needle  biopsy was also done that showed atypical cells, consistent with  possible cancer.  She was started on Carafate slurry and had decrease in  her chest pain, and her chest pain was felt to be noncardiac in origin.  She has had no hemoptysis, fever, chills, or excessive sputum.  Pulmonary function tests showed an FVC of 2.05 with an FEV1 of 1.68-78%  predicted and a DLCO of 116% corrected and 62% noncorrected.   MEDICATIONS:  1. Aspirin 25 mg a day.  2. Vytorin 10/40 one a day.  3. Toprol 25 mg a day.  4. Lotensin 10 mg a day.  5. Nicotine patch.  6. Albuterol 2.5 mg  7. Atrovent .  8. Guaifenesin.  9. Advair Diskus 250/50 two puffs twice a day.  10.Nexium 40 mg a day.  11.Evista 60 mg a day.  12.Lasix 20 mg a day.  13.Lantus 50 units a.m. and p.m.  14.Plavix 75 mg a day.  15.She has recently been started on Carafate 1 g 4 times a day.   ALLERGIES:  1. NIACIN.  2. CONTRAST MEDIA.   PAST MEDICAL HISTORY:  She has diabetes, hypertension, coronary artery  disease, and chronic obstructive pulmonary disease  and a previous  history of tobacco abuse.   FAMILY HISTORY:  Positive for esophageal cancer in her husband.   SURGERIES:  She had had tonsillectomy in 1966 with tubal ligation in  1969, appendectomy in 1970, hysterectomy in 1972, oophorectomy in 1979,  carpal tunnel in 1984, gallbladder in 1989, left total knee in 1998,  disk surgery in 2005 and 2007.   SOCIAL HISTORY:  She is married with 5 children, retired, has a 50-pack  year history of smoking, quit approximately 1 month ago and does not  drink alcohol on a regular basis.   REVIEW OF SYSTEMS:  She is 205 pounds.  She is 5 feet 2 inches.  CARDIAC:  Stable.  PULMONARY:  She has sats of 92.  She has been on home  oxygen.  She has bronchitis and asthma.  GASTROINTESTINAL:  She has got  reflux, hiatal hernia, and dysphagia.  GENITOURINARY:  Frequent  urination.  VASCULAR:  No pain in her legs with walking.  No DVT or  TIAs.  NEUROLOGIC:  No dizziness, headaches, blackouts, or seizures.  MUSCULOSKELETAL:  She has chronic arthritis.  PSYCHIATRIC:  No  depression or nervous.  EYE AND ENT:  No change in eyesight or hearing.  HEMATOLOGIC:  No problems with bleeding or clotting disorders.   PHYSICAL EXAMINATION:  GENERAL:  She is an obese Caucasian female, in no  acute distress.  VITAL SIGNS:  Her blood pressure is 145/90, pulse 108, respirations 18,  sats were 90%.  HEAD, EYES, AND EARS:  Head is atraumatic.  Eyes, pupils equal and  reactive to light and accommodation.  Ears, tympanic membranes are  intact.  Nose, there is no septal deviation.  Throat is without lesion.  NECK:  Supple without thyromegaly.  CHEST:  Clear to auscultation and percussion.  HEART:  Regular, in sinus rhythm.  No murmurs.  ABDOMEN:  Soft.  No hepatosplenomegaly.  EXTREMITIES:  Pulses are 2+.  There is no clubbing or edema.  NEUROLOGIC:  She is oriented x3.  Sensory and motor intact.  Cranial  nerves intact.   IMPRESSION:  1. Right upper lobe lesion,  probable cancer with atypical cells.  2. Chronic obstructive pulmonary disease.  3. Coronary artery disease.  4. Diabetes mellitus, type 2.  5. History of tobacco abuse.  6. Hypertension.   PLAN:  Right VATS, questionable right upper lobectomy.      Ines Bloomer, M.D.  Electronically Signed     DPB/MEDQ  D:  10/10/2008  T:  10/11/2008  Job:  [id number]

## 2011-02-21 NOTE — Discharge Summary (Signed)
NAME:  [name]                          ACCOUNT NO.:  [id number]   MEDICAL RECORD NO.:  [id number]                   PATIENT TYPE:  INP   LOCATION:  2033                                 FACILITY:  [facility]   PHYSICIAN:  Dani Gobble, MD                    DATE OF BIRTH:  [birth year]   DATE OF ADMISSION:  07/19/2003  DATE OF DISCHARGE:  07/22/2003                                 DISCHARGE SUMMARY   ADMISSION DIAGNOSES:  1. Status post L4-L5 laminectomy.  2. History of adult-onset diabetes mellitus.  3. Hypertension.  4. Hyperlipemia.  5. Chronic obstructive pulmonary disease with ongoing tobacco use.  6. History of left knee replacement.  7. History of cholecystectomy.  8. History of appendectomy.  9. History of hysterectomy.  10.      History of carpal tunnel repair.  11.      History of C5-C6 disk surgery with plating.   DISCHARGE DIAGNOSES:  1. Status post L4-L5 laminectomy.  2. History of adult-onset diabetes mellitus.  3. Hypertension.  4. Hyperlipemia.  5. Chronic obstructive pulmonary disease with ongoing tobacco use.  6. History of left knee replacement.  7. History of cholecystectomy.  8. History of appendectomy.  9. History of hysterectomy.  10.      History of carpal tunnel repair.  11.      History of C5-C6 disk surgery with plating.  12.      Status post chest pain which was felt to be atypical for coronary     disease. Plan continued medical therapy.   HISTORY OF PRESENT ILLNESS:  [name] is a 65 year old white female who  had been evaluated by Dr. Jeral Fruit about a month prior to this admission  secondary to back pain with radiation over the right hip and  posterolaterally to the right foot for several months. Pain had been getting  worse. She had used conservative therapy and including medications and  physical therapy. When she saw Dr. Jeral Fruit in followup, she was felt that she  was unable to continue with the pain. She had x-rays, and secondary to  the  findings, plans were to proceed with surgery. As well, she had a lumbar  myelogram as an outpatient. Therefore, it was discussed with her by Dr.  Jeral Fruit for plans for admission for her to undergo surgery on July 19, 2003, and she was agreeable for this procedure.   On July 19, 2003, she underwent L4-L5 laminectomy by Dr. Jeral Fruit. She did  well with the surgery without complication.   She did well postoperatively and was stable. On July 21, 2003, they were  planning to send her home; however, she then complained of dizziness and  chest pain. Her EKG showed no specific changes. Heart rate was 48, blood  pressure 122/70. They placed her on oxygen and bedrest and obtained cardiac  consult.   On  July 21, 2003, she was seen in cardiac consultation by Dr. Kem Boroughs. She described the sudden onset of headache, nausea, facial  flushing, diaphoresis, left chest pain, shortness of breath, as well as  nausea but no vomiting, and this lasted for three to four minutes. It  spontaneously resolved. At that time, her blood pressure was elevated. At  time of cardiac evaluation, she was pain free but felt washed out. At this  point in our cardiac evaluation, blood pressure was 150/56, heart rate 60.  No other significant abnormalities on physical exam other than 2/6 heart  murmur. EKG suggests sinus bradycardia with nonspecific ST-T change  __________.   At that point, Dr. Domingo Sep __________. She was stable for discharge this  morning and was later discharged home. On the morning of July 22, 2003,  Ms. [name] was much improved. She was feeling much better. She was having  discomfort to back and no further chest pain. At this time, she is stable  with a heart of 45, blood pressure __________ /64.  __________.  EKG  __________. At this point, she was seen and evaluated by Dr. Kem Boroughs  who felt that she was stable for discharge home from a cardiac standpoint.  As well, she  was seen by Dr. Jeral Fruit who felt she was stable for discharge.  It was noted that she was bradycardic, and we plan to decrease her Toprol  dose.   HOSPITAL COURSE:  Cardiology __________  July 21, 2003 for evaluation of  chest pain. Consultation was obtained with Dr. Kem Boroughs.   HOSPITAL PROCEDURES:  L4-L5 laminectomy July 19, 2003 by Dr. Jeral Fruit.   LABORATORY DATA:  Lipid profile shows total cholesterol 162, triglycerides  197, HDL 38, LDL 85. Cardiac enzymes show CK of 341, 269, 206. CK-MBs are  1.6, 1.2, 1.1. Troponin is 0.03, 0.03, 0.01. Liver function tests are  normal. Sodium is 138, potassium 4.6, glucose 233, BUN 5, creatinine 0.7.  WBC 7.6, hemoglobin 15.1, hematocrit 44.1, platelets 152.   RADIOLOGIC FINDINGS:  Portable spine on July 19, 2003 shows L4-L5  localized.   EKG shows sinus rhythm, nonspecific ST-T change.   DISCHARGE MEDICATIONS:  1. Nitroglycerin as directed.  2. Mucinex 600 mg once a day.  3. Nexium 40 mg a day.  4. Lantus 50 to 70 units at night as directed.  5. Sliding scale insulin three times a day.  6. Advair 250/50 mg as needed.  7. Toprol-XL 12.5 mg a day.  8.     Altace 5 mg a day.  9. Lasix as needed.   FOLLOW UP:  Followup with Dr. Domingo Sep in one month, [phone]. Followup with  Dr. Jeral Fruit as directed.      Mary B. Easley, P.A.-C.                   Dani Gobble, MD    MBE/MEDQ  D:  08/10/2003  T:  08/11/2003  Job:  [id number]   cc:   Hilda Lias, M.D.  [address]  [place], [place] [address]  Fax: [phone]

## 2011-02-21 NOTE — Op Note (Signed)
[facility]  Patient:    [name], [name] Visit Number: [id number] MRN: [id number]          Service Type: SUR Location: 4W [id number] Attending Physician:  Rocky Crafts Dictated by:   Michael Litter. Montez Morita, M.D. Proc. Date: 06/17/01 Admit Date:  06/17/2001                             Operative Report  PREOPERATIVE DIAGNOSIS:  Degenerative arthritis - left knee with some aseptic necrosis medial femoral condyle.  POSTOPERATIVE DIAGNOSIS:   Degenerative arthritis - left knee with some aseptic necrosis medial femoral condyle.  OPERATIVE PROCEDURE:  Left total knee replacement arthroplasty using Osteonics System with Scorpio posterior cruciate-sacrificing femur with a ______ with a tibial tray size seven with a 15 mm insert and a resurfacing patella component medial offset size seven.  SURGEON: Deloris Ping. Montez Morita, M.D.  ASSISTANT:  Georges Lynch. Darrelyn Hillock, M.D.  PROCEDURE:  After suitable spinal anesthesia, the knee was prepped and draped routinely; and after exsanguination, an upper thigh tourniquet was inflated to 375 mmHg.   A midline incision, an incision with a standard medial parapatella exposure and release of the medial collateral ligament from the tibia. Arthritis was noted particularly on the medial side.  Osteophytes were removed from the femur.  Suprapatellar pouch had been opened in the area and the supracondylar area was cleaned of tissue and an opening made in the femur and allowing a guidewire to be placed and a 10 mm cut was then made off the distal femur with a 5 degree of valgus alignment.  The alignment was done by way of the epicondyles for the cut.  The cruciate ligaments were released when this ______ were freed up through anterior halves next jig could be slid in.  It was felt that a seven was a little small, a nine was a little big.  We elected to go with a seven, measure with an eight so that we would not cut too much off the  superior surface and did that all of the cuts being made anterior, posterior and chamfering cuts.  Then resected the spines of the tibia, brought the tibia forward and a size seven tray looked ideal.  A central hole opened that up using an intramedullary guide we elected to take 10 mm off the normal lateral side and the 10 mm cut was then done.  The box osteotome was then used to cut the slot for the patella and the slot for the spine of the posterior cruciate-sacrificing tibial component.  Bone impaction was carried out as well. A trial reduction was then carried out with a 7 mm and it was felt that a 15 mm spacer seemed to be ideal without the need for a further cut on the tibia.  A cut was made at a zero degree angle and it was felt that there was no need to increase to a 5 degree slope.  Good stability medial and lateral, as well as anterior, posterior with no lift-off on full flexion.  The patella seemed to sublux easily, it would not stay in place in a no thumbs technique and elected then to resurfacing tibia.  We could use a medial offset.   The depth of the patella measured at 20 4 mm and was taken down to a 15 mm cut, taking 9 mm off the patella seemed a bit thin and elected not to go  extra mm. A trial was then carried out with the medialized patella component and with that in combination with lateral release allowed it to ride perfectly without a no thumbs technique.  The tibial tray was marked, the slots were then cut in the tibial tray, area was ______ the femur were then cleared of any osteophytes and the area was then waterpicked and then cementing was carried out in the usual fashion with the tibia first, femur second and the patella third with a 15 tray without a post being used in between and held until things had set.  After the cement had set, the tray was removed, the tourniquet was let down, hemostasis secured, and following that, the real patella tray was inserted that  is a 15 mm Scorpio tibial bearing insert posteriorly stabilized size 7 x 15 mm.  After inserting that, there was some a bit of an ooze from the back of the knee.  Some pressure was held for 5 minutes and eventually stopped.  The tourniquet had been let down before to make sure there were no arterial pumpers and everything looked quite dry and this was thought that this was maybe just a little stretching in extension and postvenous bleeders seemed to stabilize by the time 3-4 minutes of pressure was held.  Routine wound closure was then done with a two layer closure in parts of the medial incision and one in others using a #1 PVS suture, some 0 and 2-0 coated Vicryl in the subcu and a running Monocryl with Steri-Strips in the skin.  One Hemovac was left in the knee.  Twenty cc of 0.5% plain Marcaine were injected, some into the joint, some into the tissues.  Gelfoam was placed in the femoral canal and the tibial canal before cementing.  Bone wax was used in the notch of the femur.  Some Gelfoam was placed in the posterior aspect of the knee to aid in coagulation in the back part of the joint was bovied prior to inserting all of the components.  ______ compression dressing, goes to recovery in good condition. Dictated by:   [employer]. Montez Morita, M.D. Attending Physician:  Rocky Crafts DD:  06/17/01 TD:  06/17/01 Job: [id number]

## 2011-02-21 NOTE — Op Note (Signed)
NAME:  [name], [name]                          ACCOUNT NO.:  [id number]   MEDICAL RECORD NO.:  [id number]                   PATIENT TYPE:  OIB   LOCATION:  3009                                 FACILITY:  [facility]   PHYSICIAN:  Reinaldo Meeker, M.D.              DATE OF BIRTH:  19-Jun-1946   DATE OF PROCEDURE:  06/27/2002  DATE OF DISCHARGE:                                 OPERATIVE REPORT   PREOPERATIVE DIAGNOSIS:  Herniated disk, C5-6 left.   POSTOPERATIVE DIAGNOSIS:  Herniated disk, C5-6 left.   PROCEDURE:  C5-6 anterior cervical diskectomy with bone bank fusion,  followed by Zephyr anterior cervical plating with the operating microscope.   SURGEON:  Reinaldo Meeker, M.D.   ASSISTANT:  Kathaleen Maser. Pool, M.D.   DESCRIPTION OF PROCEDURE:  After being placed in a supine position in five  pounds Holter traction, the patient's neck was prepped and draped in the  usual sterile fashion.  A localizing x-ray was taken prior to incision to  identify the appropriate level.  A transverse incision was made in the right  anterior neck starting at the midline and headed toward the medial aspect of  the sternocleidomastoid muscle.  The platysma muscle was then incised  transversely.  The natural fascial plane between the strap muscles medially  and the sternocleidomastoid laterally was identified and followed down to  the anterior aspect of the cervical spine.  The longus colli muscles were  identified and split in the midline and stripped away bilaterally with  Kitner dissection and Key elevator.  A self-retaining retractor was placed  for exposure, and x-ray showed approach to the appropriate level.  Using the  15 blade, the annulus of the disk at C5-6 was incised.  Using pituitary  rongeurs and curettes, approximately 90% of the disk material was removed.  A high-speed drill was used to widen the interspace.  The microscope was  draped, brought into the field, and used for the remainder of the  case.  Using microdissection technique, the remainder of the disk material along  the posterior longitudinal ligament was removed.  The ligament was removed.  The ligament was then incised transversely and the cut edge removed with a  Kerrison punch.  Inspection out the foramen bilaterally showed herniated  disk material toward the left, symptomatic side, and this was removed.  It  gave excellent decompression to the underlying C6 nerve root.  Similar  decompression was carried out toward the patient's right, asymptomatic side.  At this point inspection was carried out in all directions for any evidence  of residual compression.  None could be identified.  Large amounts of  irrigation were carried out.  Any bleeding was controlled with bipolar  coagulation and Gelfoam.  Measurements were taken, and an 8 x 11 x 14 mm  bone bank plug was reconstituted.  After irrigating once more, the plug was  impacted  and fluoroscopy showed this to be in good position.  A Zephyr  anterior cervical plate was then chosen of the appropriate length.  Under  fluoroscopic guidance drill holes were placed, followed by placement of 13  mm screws x4.  Final fluoroscopy showed the plug, plate, and screws to all  be in good position.  At this point large amounts of irrigation were carried  out and any bleeders controlled with bipolar coagulation.  The wound was  then closed using interrupted Vicryl in the platysma muscle, inverted 5-0  PDS in the subcuticular layer, and Steri-Strips on the skin.  A sterile  dressing was then applied, and the patient was extubated and taken to the  recovery room in stable condition.                                              Reinaldo Meeker, M.D.   ROK/MEDQ  D:  06/27/2002  T:  06/28/2002  Job:  [id number]

## 2011-02-21 NOTE — Discharge Summary (Signed)
[facility]  Patient:    [name], [name] Visit Number: [id number] MRN: [id number]          Service Type: [facility] Location: [facility] Attending Physician:  Rocky Crafts Dictated by:   Judeth Porch. Perkins, P.A.-C. Admit Date:  07/08/2001 Disc. Date: 06/21/01                             Discharge Summary  ADMITTING DIAGNOSES: 1. Left knee end-stage osteoarthritis. 2. Chronic obstructive pulmonary disease. 3. Coronary artery disease. 4. Hiatal hernia. 5. Diabetes mellitus. 6. Cervical disc disease. 7. Osteoarthritis.  DISCHARGE DIAGNOSES: 1. Left knee degenerative arthritis with aseptic necrosis medial femoral    condyle, status post left total knee replacement and arthroplasty. 2. Postoperative hemorrhagic anemia. 3. Left knee end-stage osteoarthritis. 4. Chronic obstructive pulmonary disease. 5. Coronary artery disease. 6. Hiatal hernia. 7. Diabetes mellitus. 8. Cervical disc disease. 9. Osteoarthritis.  PROCEDURE:  The patient was taken to the OR on June 17, 2001 and underwent a left total knee replacement and arthroplasty.  SURGEON:  Philips J. Montez Morita, M.D.  ASSISTANT:  Georges Lynch. Darrelyn Hillock, M.D.  ANESTHESIA:  Spinal anesthesia.  CONSULTATIONS:  Medical Reather Littler, M.D.  BRIEF HISTORY:  The patient is a 65 year old female with a longstanding history of left knee pain for approximately four years now.  She is status post left knee arthroscopy approximately five years ago.  She has had multiple medications concerning her left knee pain and has undergone anti-inflammatory treatment by mouth and also interarticular injections.  She has now progressed to where the pain is interfering with her daily activities and is not responding to conservative measures.  It was felt that she would benefit by undergoing a total knee replacement, risks and benefits discussed and she is subsequently admitted to the hospital.  LABORATORY DATA:  CBC on  admission showed a hemoglobin of 13.7, hematocrit 38.9, white cell count 9.9, red cell count 4.77.  Serial H&H are followed throughout the hospital course.  Postoperative hemoglobin 12.5, hematocrit of 36.2, last noted H&H 10.9 and 31.1.  Differential on the admission CBC was within normal limits.  PT and PTT on admission were 12.6 and 28, respectively with an INR of 0.9, serial pro times followed per Coumadin protocol.  Last noted PT, INR were 20.8 and 2.2, respectively.  Chemistry panel on admission elevated glucose of 287, remaining chemistry panel all within normal limits. Urinalysis on admission was positive for glucose, a few epithelial cells, 0-2 white cells, otherwise negative urinalysis.  Blood group type B positive.  Preoperative chest x-ray dated June 10, 2001 borderline cardiomegaly with pulmonary vascular congestion, moderate chronic interstitial lung disease, compatible with a history of smoking.  Preoperative left knee films show degenerative changes with moderate medial joint space narrowing and mild posterior patella preformation.  I do not see an EKG on this chart.  HOSPITAL COURSE:  The patient admitted to [facility] and was taken to the OR.  Underwent above stated procedure without complications.  The patient tolerated the procedure well and was later taken to the recovery room and then to the orthopedic floor.  Continued postoperative care, vital signs were followed.  The patient was placed on p.o. and IV analgesics for pain control following surgery.  She was given postoperative antibiotics.  She was placed on touchdown weightbearing to the left lower extremity.  Physical therapy and occupational therapy were consulted to assist with gait training ambulation.  The patient initial slow to progress in physical therapy, however, did progress during the hospital course.  She was ambulating initially only three feet by postoperative day #1 and increased to 30  feet by postoperative day #2 and was walking greater than 150 feet by postoperative day #4.  She was placed in a CPM to assist with range of motion.  Due to her diabetes, medicine consult was called in.  The patient was seen and evaluated by Dr. Reather Littler.  Her blood glucose levels and medications for her diabetes were adjusted and followed by Dr. Lucianne Muss closely.  The patient did require some oxygen by nasal canal postoperatively due to her COPD, however, the patient did have a longstanding history of COPD and states that she became short of breath with only normally walking to her mailbox which is approximately 40-50 feet.  This has been ongoing for some time.  She was also slightly anemic postoperatively.  Despite this she did quite well and progressed very well, greater than 150 feet postoperatively.  She continued to progress well until postoperative day #4 and the patient desired to be discharged home.  Due to the fact that she was improving, she was released from the hospital on June 21, 2001.  DISCHARGE PLAN: 1. The patient is discharged home on June 21, 2001. 2. Discharge diagnosis, please see above.  DISCHARGE MEDICATIONS: 1. Percocet #40 p.r.n. pain. 2. Skelaxin 400 mg #40 p.r.n. spasms. 3. Trinsicon #90 t.i.d., Colace 100 mg #60 b.i.d.  ACTIVITIES:  She is touchdown weightbearing to the left lower extremity.  Home health physical therapy and home health nursing for gait training, ambulation and also pro time draws.  FOLLOWUP:  Two weeks from date of surgery.  DIET:  A 2000 calorie ADA diet.  DISPOSITION:  Home.  CONDITION UPON DISCHARGE:  Improved. Dictated by:   Alexzandrew L. Perkins, P.A.-C. Attending Physician:  Rocky Crafts DD:  07/13/01 TD:  07/13/01 Job: [id number]

## 2011-02-21 NOTE — Op Note (Signed)
[facility]  Patient:    [name], [name] Visit Number: [id number] MRN: [id number]          Service Type: Attending:  Philips J. Montez Morita, M.D. Dictated by:   [employer] Montez Morita, M.D. Proc. Date: 12/06/01                             Operative Report  This may be a redictation.  If the earlier note if found, please discard this one.  PREOPERATIVE DIAGNOSIS:  Adhesions, left knee, following total knee replacement.  Some stiffness and discomfort.  POSTOPERATIVE DIAGNOSIS:  Adhesions, left knee, following total knee replacement.  Some stiffness and discomfort.  OPERATIVE PROCEDURE:  Debridement of the adhesions arthroscopically.  ANESTHESIA:  General.  SURGEON:  Philips J. Montez Morita, M.D.  TECHNIQUE:  Arthroscopy was carried out through the usual portals with findings of significant adhesions and scar tissue growing up into the intercondylar notch, all of which was removed with a small rotary meniscotome until things were quite clear.  Lateral peripatella inflow portal anterolaterally and instrumentation medially with some switching around so that the inflow was put in through the scope at times and through the lateral peripatella portal instrumentation to better access the suprapatellar pouch. At the end, the bones were suture with single nylon, and she was put into a motion machine. Dictated by:   [employer] Montez Morita, M.D. Attending:  Philips J. Montez Morita, M.D. DD:  02/09/02 TD:  02/09/02 Job: [id number]

## 2011-02-21 NOTE — Cardiovascular Report (Signed)
[place]. [facility]  Patient:    [name], [name]                     MRN: [id number] Proc. Date: 05/07/01 Adm. Date:  [id number] Disc. Date: [id number] Attending:  Ruta Hinds CC:         Butch Penny, M.D.   Cardiac Catheterization  PROCEDURES PERFORMED: 1. Left heart catheterization. 2. Coronary angiography. 3. Left ventriculogram. 4. Abdominal aortogram.  ATTENDING:  Lenise Herald, M.D.  COMPLICATIONS:  None.  INDICATIONS:  [name] is a 65 year old white female, patient of Dr. Butch Penny and Dr. Lenise Herald, with a history of insulin-dependent diabetes mellitus, remote catheterization in 1998, with noncritical disease for RCA and normal EF.  The patient has recently complained of intermittent chest pain and underwent Persantine Cardiolite revealing evidence of anterior wall ischemia with a normal EF.  She is now referred for cardiac catheterization to define her coronary anatomy.  DESCRIPTION OF PROCEDURE:  After giving informed written consent, the patient was brought to the cardiac catheterization laboratory where her right and left groins were shaved, prepped, and draped in the usual sterile fashion.  ECG monitoring was established.  Using the modified Seldinger technique, a 6-French arterial sheath was inserted into the right femoral artery.  A 6-French diagnostic catheter was then used to perform diagnostic angiography.  This reveals a large left main with no significant disease.  The LAD is a medium sized vessel which coursed to the apex and gave rise to one diagonal branch.  The LAD is noted to have a 20% mid vessel stenotic lesion after the takeoff of the first diagonal.  The first diagonal was a medium sized vessel which bifurcates in its mid segment and has no significant disease.  The left circumflex is a medium sized vessel which coursed in the AV groove and gave rise to three obtuse marginal branches.  The  AV groove circumflex has no significant disease.  The first obtuse marginal is a small vessel with no significant disease.  The second and third obtuse marginals are medium sized vessels with no significant disease.  The right coronary artery is a large vessel which is dominant and gives rise to both a PDA, as well as a posterolateral branch.  The RCA is noted to have a 50% mid vessel and 30% distal stenotic lesion.  The PDA is a medium sized vessel which is irregular, with 50% mid vessel lesion.  The posterolateral branch is irregular, but has no high-grade stenosis.  The left ventriculogram reveals a preserved EF of 70%.  Abdominal aortogram reveals no evidence of renal artery stenosis, with mild distal aortic tapering.  HEMODYNAMICS:  Systemic arterial pressure is 180/82, LV systemic pressure is 184/12, LV EDP 16.  CONCLUSIONS: 1. No significant coronary artery disease. 2. Normal left ventricular systolic function. 3. No evidence of renal artery stenosis. 4. Systemic hypertension. DD:  05/07/01 TD:  05/09/01 Job: [id number]

## 2011-02-21 NOTE — Consult Note (Signed)
[place]. [facility]  Patient:    [name], [name] Visit Number: [id number] MRN: [id number]          Service Type: SUR Location: 4W 0466 01 Attending Physician:  Rocky Crafts Dictated by:   Reather Littler, M.D. Admit Date:  06/17/2001   CC:         Philips J. Montez Morita, M.D.  Butch Penny, M.D.   Consultation Report  CHIEF COMPLAINT:  Diabetes.  HISTORY OF PRESENT ILLNESS:  This 65 year old Caucasian lady has had diabetes for about 31 years.  Apparently, she used to have gestational diabetes with her pregnancies and, after her last child, she continued to have hyperglycemia.  She was treated with oral hyperglycemics for several years and, apparently, had been on several different kinds with variable success. About 14 years ago she was put on insulin because of failure of oral agents. She, apparently, was on Humalog, NPH, and Regular Insulin for several years but, apparently, it did not control her glucose very well.  She was also tried on Rezulin which she claims did not help, even though she took it for more than two or three months.  About a year ago she was switched to Lantus insulin which she says has helped bring her sugars down although they are still variable.  She was checking her blood sugars about twice a day, and they varied between 114 and somewhat over 200; however, they may be higher at times later in the day and occasionally over 300.  She did not have any hypoglycemia.  The patient has not had any specific diet instructions or diabetes education. She has been unable to exercise because of her knee pain and also recently episodes of chest pain.  Currently the patient takes 80 units of Lantus insulin and Humalog only if the sugar is over 200.  She does not check her postprandial blood sugars.  She does not know what her hemoglobin A1C is, although she thinks it has been monitored.  PAST MEDICAL HISTORY: 1. Cholecystectomy. 2.  Appendicitis. 3. Hysterectomy. 4. BSO. 5. Carpal tunnel surgery. 6. Arthroscopy of knee.  SOCIAL HISTORY:  She has had four children.  She is in the process of quitting smoking but had been smoking up to four packs a day.  FAMILY HISTORY:  Strongly positive for diabetes and heart disease.  MEDICATIONS PRIOR TO ADMISSION:  Wellbutrin b.i.d., Lantus insulin, Zocor, Altace, Nexium, Toprol, nicotine patch, and Combivent inhaler p.r.n.  ALLERGIES:  None.  REVIEW OF SYSTEMS:  The patient has had mild coronary disease with chest pain in July.  She does not take nitroglycerin currently.  She has had occasional difficulty breathing.  She has had some numbness and tingling in her feet. She has had eye exams every year.  She does not know if she has retinopathy but has early cataracts.  She has been on Altace, presumably for proteinuria. She has had reflux and occasional vomiting.  No history of diarrhea.  No history of urinary problems.  No history of thyroid disease.  She has had severe left-sided osteoarthritis of the knee which ______ end-stage arthritis, and she had surgery for this reason.  PHYSICAL EXAMINATION:  GENERAL:  Alert and oriented.  Pleasant and cooperative.  VITAL SIGNS:  Pulse 76, blood pressure 140/85, temperature normal.  SKIN:  There is no pallor, clubbing, lymphadenopathy, or jaundice.  She has no edema peripherally.  HEENT:  Eye exam externally normal.  Fundi not examined at this time.  ENT  exam shows dry mucous membranes.  NECK:  No thyroid enlargement or carotid bruit.  No lymphadenopathy.  HEART:  Heart sounds are normal.  LUNGS:  Clear.  ABDOMEN:  No hepatosplenomegaly or tenderness.  No distention.  EXTREMITIES:  No edema.  Pedal pulses are normal.  Normal touch sensation.  ASSESSMENT:  The patient has long-standing type 2 diabetes with probable moderate hyperglycemia.  She is only on Lantus insulin with variable control of fasting blood sugars and  probably postprandial hyperglycemia.  She has mild peripheral neuropathy, history of hypertension, and possible proteinuria related to diabetes.  She also has some coronary disease.  She reportedly has a low HDL but, apparently, her total cholesterol was not high. She also has osteoarthritis and reflux.  RECOMMENDATIONS:  Currently, since the patient is not eating, she will be continued on Lantus with the dose increased as needed.  On the postoperative day her insulin will be tapered off and 15 units of Lantus given and this adjusted accordingly.  She will need to reassess her diet and education given. She will probably need Humalog and carbohydrate counting at mealtimes. Dictated by:   Reather Littler, M.D. Attending Physician:  Rocky Crafts DD:  06/18/01 TD:  06/18/01 Job: [id number]

## 2011-02-21 NOTE — Consult Note (Signed)
NAME:  [name], [name]                ACCOUNT NO.:  [id number]   MEDICAL RECORD NO.:  [id number]          PATIENT TYPE:  AMB   LOCATION:                                FACILITY:  [facility]   PHYSICIAN:  R. Roetta Sessions, M.D. DATE OF BIRTH:  26-Oct-1945   DATE OF CONSULTATION:  11/26/2005  DATE OF DISCHARGE:                                   CONSULTATION   REFERRING PHYSICIAN:  Barbaraann Barthel, M.D.   REASON FOR CONSULTATION:  Left upper quadrant abdominal pain, abnormal  esophagus on CT, abnormal colon on CT.   HISTORY OF PRESENT ILLNESS:  Ms. [name] is a pleasant, 65 year old,  Caucasian female known to me from prior evaluation and referred by Dr.  Malvin Johns to further evaluate several-month history of intermittent, medial  left upper quadrant abdominal pain.  The pain waxes and wanes, sometimes  comes on predominantly after she eats.  She also has some early satiety and  nausea and vomiting.  Sometimes she may vomit partially digested meal which  she consumed 2 days earlier.  She does have some gastroesophageal reflux  disease, but fairly well-controlled on Protonix.   She was originally seen by Dr. Renard Matter, her primary care physician, for this  problems.  He thought she had a hernia and saw Dr. Malvin Johns.  He  subsequently ordered a CT scan which demonstrated moderate to marked distal  esophageal wall thickening.  No obvious abdominal wall hernia noted.  She is  status post cholecystectomy.  She had some advanced changes of  atherosclerosis in the abdominal aortic and branch vessels.  She also had  some thickening of her sigmoid colon.  This nice lady has undergone EGD by  me previously lastly approximately 6 years ago.  She was felt to have  gastroparesis at that time.  She had been on Propulsid previously with good  control of her symptoms.  She has not had her entire low GI tract imaged,  although I did do a sigmoidoscopy on this nice lady for colorectal cancer  screening  purposes.  Back in 2000, she was found to have a couple left-sided  diverticular, otherwise exam was negative to 60 cm.  There is no family  history of colorectal neoplasia.  She has not lost any weight.  She has  tendency towards having diarrhea.  She has not had any melena or rectal  bleeding.  She denies odynophagia or dysphagia.  She tells me her hemoglobin  A1c has been running in the 7 range.   PAST MEDICAL HISTORY:  1.  Longstanding type 2 diabetes mellitus, now on insulin.  2.  History of asthma.  3.  COPD.  4.  Hypertension.  5.  Hypercholesterolemia.  6.  History of gastroesophageal reflux disease.   PAST SURGICAL HISTORY:  1.  Tubal ligation.  2.  Cholecystectomy.  3.  Appendectomy.  4.  Left knee surgery.  5.  Back surgery x2.  6.  Coronary artery disease with stent placement previously.  7.  Carpal tunnel surgery bilaterally.  8.  Hysterectomy.   CURRENT MEDICATIONS:  1.  Enteric coated aspirin one tablet daily.  2.  Combivent inhaler p.r.n.  3.  Imodium p.r.n.  4.  Plavix 75 mg daily.  5.  Evista 60 mg daily.  6.  Furosemide 20 mg daily.  7.  Vytorin 10/40 mg daily.  8.  Benazepril 10 mg daily.  9.  Protonix 40 mg daily.  10. Toprol XL 25 mg a day.  11. Calcium 500 mg daily.  12. Lantus 80 units at bedtime.  13. Humalog 10 units t.i.d.  14. Combivent 14.7/200 p.r.n.  15. Advair 250/50 b.i.d.  16. Nebulizer mix b.i.d.  17. Albuterol b.i.d.  18. Ipratropium twice daily p.r.n.   ALLERGIES:  No known drug allergies.   FAMILY HISTORY:  Mother is alive with history of hypertension, diabetes and  heart problems.  Father died at age 66 with a stroke.   SOCIAL HISTORY:  The patient is married.  She has two adopted children and  three biological children.  She is disabled.  She smokes one pack of  cigarettes per day.  No alcohol.   REVIEW OF SYSTEMS:  No chest pain or dyspnea on exertion.  No fevers or  chills.  Otherwise, as in history of present  illness.   PHYSICAL EXAMINATION:  GENERAL:  A pleasant, 65 year old lady resting  comfortably.  VITAL SIGNS:  Weight 208, height 5 feet 1.5 inches.  Temperature 97.5, BP  140/72, pulse 68.  SKIN:  Warm and dry.  There is no jaundice.  No stigmata of chronic liver  disease.  HEENT:  No scleral icterus.  Conjunctivae pink.  Oral cavity with no  lesions.  CHEST:  Lungs are clear to auscultation.  HEART:  Regular rate and rhythm without murmurs, rubs or gallops.  ABDOMEN:  Obese, right upper quadrant scar well-healed.  Soft, positive  bowel sounds.  No bruit.  Somewhat tender just below her left medial costal  margin.  I do not appreciate any abdominal wall defects, hernias, etc.  No  obvious mass or hepatosplenomegaly.  EXTREMITIES:  No edema.  RECTAL:  Deferred at the time of colonoscopy.   IMPRESSION:  Ms. [name] is a pleasant, 65 year old lady with  intermittent, medial, left upper quadrant abdominal pain of a postprandial  component.  She is not constipated and if anything, she has a tendency  towards diarrhea.  She has longstanding gastroesophageal reflux disease,  early satiety with nausea and vomiting.  Clinically, I have previously made  a diagnosis of gastroparesis.  She is not on any prokinetic therapy at this  time.  She has never had her entire lower gastrointestinal tract evaluated.   She does have some concerning findings on computed tomography in regards to  thickening of her left colon and distal esophagus.  I agree with Dr.  Malvin Johns that she needs further evaluation.   RECOMMENDATIONS:  EGD and colonoscopy in the very near future.  Potential  risks, benefits and alternatives have been reviewed and questions answered.  She is agreeable.  Will plan for endoscopic evaluation in the very near  future when we will be able to make further recommendations.   I would like to thank Dr. Malvin Johns for allowing me to see this nice lady.      Jonathon Bellows, M.D.   Electronically Signed     RMR/MEDQ  D:  11/26/2005  T:  11/27/2005  Job:  [id number]   cc:   Barbaraann Barthel, M.D.  Fax: [phone]   Angus G. Renard Matter, MD  Fax: [phone]

## 2011-02-21 NOTE — Discharge Summary (Signed)
[place]. [facility]  Patient:    [name], [name] Visit Number: [id number] MRN: [id number]          Service Type: MED Location: 2000 2018 01 Attending Physician:  Berry, Jonathan [place] Dictated by:   [facility] [place], [place]. Admit Date:  08/16/2001 Discharge Date: 08/18/2001   CC:         Butch Penny, M.D.             Dr. Montez Morita                           Discharge Summary  ADMISSION DIAGNOSES: 1. Chest pain, questionable etiology. 2. Shortness of breath. 3. Hypertension. 4. Hyperlipidemia. 5. Diabetes mellitus, type 2. 6. Status post cardiac catheterization July 2002, revealing nonobstructive    coronary artery disease and normal left ventricular function. 7. Tobacco use.  DISCHARGE DIAGNOSES: 1. Chest pain, questionable etiology. 2. Shortness of breath. 3. Hypertension. 4. Hyperlipidemia. 5. Diabetes mellitus, type 2. 6. Status post cardiac catheterization July 2002, revealing nonobstructive    coronary artery disease and normal left ventricular function. 7. Tobacco use. 8. Bradycardia.  HISTORY:  [name] is a 65 year old white female with a history of hypertension, hyperlipidemia, diabetes mellitus type 2, and noncritical coronary artery disease who presented to the [facility] office on August 16, 2001, with complaints of chest pain.  She had been awakened with mid substernal chest pain associated with diaphoresis.  Duration of 15-20 minutes and relieved with sublingual nitroglycerin x1.  She had no vomiting, but did have nausea.  The chest pain recurred in a recurred in a stuttering fashion throughout the day.  She says it was similar to her previous episodes of chest discomfort in the past, but different as this had increased in both duration and severity over the last 5-6 months.  As well, she had experienced left lower extremity acute onset of swelling of the last 4-5 days, but she did have a total knee replacement recently.  As  well, she had some warmth and mild erythema there.  She continued to smoke.  She had a heart catheterization in July 2002, with 20% mid LAD disease, 50% mid RCA disease, 30% distal RCA, 50% mid PDA.  Her last echo showed normal LV function and LVH.  At that time, her EKG showed normal sinus rhythm, nonspecific changes.  No evidence for significant ischemia.  At that time, she was seen and evaluated by Dr. Kem Boroughs.  She felt that her chest pain could be possible unstable angina and questionable ruptured plaque, as well as her blood pressure was poorly controlled.  We would continue her Zocor, but follow her liver function tests and followup her lipid therapy.  We discussed smoking cessation.  At that time, she was planned for admission to the hospital.  Serial enzymes were rechecked to rule out MI, as well as followup EKGs.  She would be placed on IV heparin.  We would check duplex Dopplers of the lower extremity to evaluate for DVT.  We would optimize medical treatment for blood pressure, diabetes, and lipid control.  As well, she was started on low dose IV nitroglycerin drip as she continued to have stuttering chest pain.  HOSPITAL COURSE:  On August 17, 2001, [name] was still complaining of left-sided chest pain.  However, her enzymes were negative x2.  EKG remained without change.  She remained on IV heparin, IV nitroglycerin, as well as other medications.  On August 17, 2001, she was seen by orthopedic consultation.  There was o evidence of septic knee.  They agreed and planned to follow up with results of venous Doppler.  They had no other suggestions.  On August 18, 2001, [name] had complaints of sharp, fleeting, atypical chest pains that were lasting seconds.  At this point, her enzymes are all negative for MI.  Followup CT is negative for PE.  It is felt that her chest pain is atypical in nature.  At this point, she is afebrile at 97.8, pulse 48, blood  pressure 122/65, oxygen saturation 97% on 2 L.  Telemetry shows sinus bradycardia 50 beats per minute.  Lungs are clear without rales.  Heart is in regular rhythm with no ectopy and extremities show no edema and negative Homans sign.  At this time, she is seen and evaluated by Dr. Nicki Guadalajara who deems her stable for discharge home.   As well, she was seen again by orthopedics who also felt that she was stable for discharge home and followup office visit.  CONSULTS:  Orthopedic consultation.  On August 16, 2001, by Dr. Montez Morita for followup of recent total knee replacement on the left.  PROCEDURES:  None.  LABORATORIES:  Lipid profile shows total cholesterol 130, triglycerides 160, HDL 41, LDL 57.  Cardiac enzymes negative x3, with CKs at 68, 46, and 51; MB 0.3, 2.7, 0.5; and troponin 0.01, 0.01, and less than 0.01.  Hemoglobin A1c at 6.0.  Sodium 143, potassium 4.3, glucose 194, BUN 9, creatinine 0.7.  LFTs normal.  WBC 9.7, RBC 4.89, hemoglobin 13.2, hematocrit 39.2, MCV 80.0, MCHC 33.7, platelets 163.  On admission, PT 12.8, INR 1.0, PTT 30.  Thereafter, the PTT was elevated at the 68-92 range.  She was on IV heparin therapy.  EKG showed normal sinus rhythm, nonspecific ST-T changes.  DISCHARGE MEDICATIONS: 1. Zocor 80 mg a day. 2. Toprol XL 25 mg a day. 3. Altace 10 mg a day. 4. Insulin as before. 5. Nexium 40 mg once a day.  DIET:  Low salt, low fat, low cholesterol diabetic diet.  FOLLOWUP:  Follow up with Dr. Montez Morita as previously scheduled.  Follow up with Dr. Renard Matter in two weeks, and on Monday call [phone] and make an appointment to see Dr. Jenne Campus in the next upcoming months. Dictated by:   [facility] [place], [place]. Attending Physician:  Berry, Jonathan [place] DD:  08/30/01 TD:  08/30/01 Job: [id number]

## 2011-02-21 NOTE — Cardiovascular Report (Signed)
NAME:  [name], [name]                          ACCOUNT NO.:  [id number]   MEDICAL RECORD NO.:  [id number]                   PATIENT TYPE:  INP   LOCATION:  3712                                 FACILITY:  [facility]   PHYSICIAN:  Cristy Hilts. Jacinto Halim, M.D.                  DATE OF BIRTH:  11-18-1945   DATE OF PROCEDURE:  10/24/2003  DATE OF DISCHARGE:                              CARDIAC CATHETERIZATION   REFERRING PHYSICIAN:  Dani Gobble, MD, Darlin Priestly, M.D., and Dr.  Butch Penny, Montez Hageman.   PROCEDURE PERFORMED:  1. Left ventriculography.  2. Selective right and left coronary arteriography.  3. Abdominal aortogram.  4. Calculation of fractional flow reserve by means of wave wire.  5. Right femoral angiography and closure of the right femoral artery access     with Angio-Seal.   INDICATION:  Ms. [name] is a 65 year old Caucasian female with history  of smoking, diabetes, hypertension, hyperlipidemia who has known noncritical  coronary artery disease by cardiac catheterization that was done in 2002  with known 40-50% RCA stenosis.  She was admitted to the hospital with chest  pain suggestive of unstable angina.  Given her multiple cardiac risk  factors, she was brought to the cardiac catheterization lab to evaluate her  coronary anatomy.   HEMODYNAMIC DATA:  1. The left ventricular pressures were 160/-2 with end-diastolic pressure of     4 mmHg.  2. Aortic pressure 157/58 with a mean of 97 mmHg.  3. There was no pressure gradient across the aortic valve.   ANGIOGRAPHIC DATA:  Left ventricle:  Left ventricular systolic function was  normal and ejection fraction was estimated at 60%.  There is no significant  mitral regurgitation.   Right coronary artery:  The right coronary artery is a very large caliber  vessel and a dominant vessel.  It gives origin to large PDA and PLV  branches.  The proximal segment has 50% smooth stenosis and the mid segment  has about 50-60% stenosis.   There is mild luminal irregularity in the distal  RCA.   Left main coronary artery:  Left main coronary artery is a large caliber  vessel. It is normal.   Circumflex coronary artery:  Circumflex coronary artery is a small vessel.  It gives origin to large obtuse marginal one, continues to obtuse marginal 2  after giving a small AV groove circumflex.  It is normal.   Left anterior descending artery:  Left anterior descending artery is a large  caliber vessel.  It gives origin to large diagonal.  The diagonal 1 has  ostial 20% stenosis.  Otherwise, the LAD itself has mild luminal  irregularity.   WAVE WIRE DATA:  The calculation of fractional flow with administration of  30 mcg of Adenosine x3 revealed that the fractional flow reserve was 0.88  and 0.89.  This was not physiologically significant.   IMPRESSION:  1. Normal left ventricular systolic function with ejection fraction of 60%.  2. Intermediate right coronary artery stenosis by 50% in the proximal     segment and 50-60% in the mid segment.  Physiologically, not significant.     FFR is not significant for physiological significant stenosis.  3. Normal abdominal aorta, normal renal arteries.   RECOMMENDATIONS:  Based on the data, continued medical therapy is advised.  Evaluation for noncardiac cause of chest pain is indicated.  Risk factor  modification including smoking cessation and aggressive control of diabetes  is indicated.   TECHNIQUE OF DIAGNOSTIC CARDIAC CATHETERIZATION:  Under usual sterile  precautions, using a 6 French right femoral arterial access, a 6 French  angled pigtail catheter was advanced into the ascending aorta and then into  the left ventricle.  Left ventricular pressures were monitored.  Hand  contrast injection of the left ventricle was performed both in the LAO and  RAO projection.  The catheter was flushed with saline and pulled back into  the ascending aorta and pressure gradient across the aortic  valve was  monitored.  Then, the catheter was pulled out of the body in the usual  fashion and a 6 [place] Judkins right 4 diagnostic catheter was advanced into  the ascending aorta and right coronary artery was selectively engaged.  Then, angiography was repeated.  The catheter was then exchanged to a 6  [place] Judkins left 4 diagnostic catheter and angiography was repeated in  the left coronary system.   TECHNIQUE OF WAVE WIRE FLOW MEASUREMENT:  5000 units of intravenous heparin  was administered.  ACT was maintained at greater than 250. Then, using a 6  [place] JR-4 guide RCA was engaged.  Then, a 0.014-inch x 180 cm flow wire  was advanced into the proximal RCA and just outside of the guide the  baseline  FFR was measured and the equipment was calibrated.  Then, the flow  wire was carefully pushed into the distal RCA and 30 mcg of intracoronary  Adenosine was administered rapidly and FFR was measured. The flow wire was  then pulled just to the outflow of the second stenosis and repeat Adenosine  was administered and FFR was remeasured.  After obtaining adequate FFR, the  flow wire was withdrawn out of the RCA.  Angiography was repeated.  Then,  the guide was disengaged from the RCA and pulled out of the body in the  usual fashion.  Right femoral angiography was performed through the arterial  access sheath and the access was closed with Angio-Seal.  There was mild  oozing noted at the access site.  However, manual pressure was held for 10  minutes.  The patient was then transferred to the recovery in stable  condition.  The patient tolerated the procedure well.                                               Cristy Hilts. Jacinto Halim, M.D.    Pilar Plate  D:  10/24/2003  T:  10/25/2003  Job:  [id number]   cc:   Darlin Priestly, M.D.  [address], [address]  [place]  [place] [address]  Fax: [phone]   Angus G. Renard Matter, M.D.  [address]  [place]  [place] [address] Fax: [phone]

## 2011-02-21 NOTE — Discharge Summary (Signed)
NAME:  ARYEL, EDELEN                          ACCOUNT NO.:  [id number]   MEDICAL RECORD NO.:  [id number]                   PATIENT TYPE:  INP   LOCATION:  3712                                 FACILITY:  [facility]   PHYSICIAN:  Dani Gobble, MD                    DATE OF BIRTH:  Mar 25, 1946   DATE OF ADMISSION:  10/23/2003  DATE OF DISCHARGE:  10/25/2003                                 DISCHARGE SUMMARY   ADMISSION DIAGNOSES:  1. Chest pain.  2. History of coronary artery disease.  3. Normal left ventricular function.  4. Hypertension.  5. Diabetes mellitus.  6. Hyperlipidemia.  7. Chronic obstructive pulmonary disease.   DISCHARGE DIAGNOSES:  1. Chest pain.  2. History of coronary artery disease.  3. Normal left ventricular function.  4. Hypertension.  5. Diabetes mellitus.  6. Hyperlipidemia.  7. Chronic obstructive pulmonary disease.  8. Status post cardiac catheterization on October 24, 2003 by Dr. Yates Decamp.     No obstructive CAD.  Plans for medical therapy.  Felt to be noncardiac     chest pain.  9. Status post CT scan.  Negative for pulmonary embolism.   HISTORY OF PRESENT ILLNESS:  Ms. Grandpre is a 65 year old white female who  presented to the office on October 23, 2003 for evaluation.  She has a  history of diabetes, hypertension, hyperlipidemia, COPD, and ongoing tobacco  use.  She has had multiple surgeries as well.  She requested to be seen as  an add-on to the operative schedule on October 23, 2003.  She reported being  awakened before 5 a.m. on Thursday morning with chest pain.  She located it  to be in the mid left chest region.  It radiated to the left arm.  She had  some associated shortness of breath, nausea, diaphoresis.  She thought it  lasted for about 5-10 minutes.  She subsequently was admitted to [facility] at that time.  She was discharged home on the following Friday with  plans for outpatient Cardiolite the following week; however, she  continued  to have nitroglycerin-responsive chest discomfort intermittently throughout  the weekend.  She was becoming quite frightened with it.  She had required  nitroglycerin throughout the weekend.  The discomfort has been worse through  the week with exertion.   She had a history of CAD with a history of cardiac cath back in August, 2002  with mild disease.  At that time, she was seen and evaluated by Dr. Justice Britain.  She was stable.  Her EKG showed normal sinus rhythm without  ischemic change.  At that point, it was felt that she needed to be admitted  to [facility] with a check of serial enzymes to rule out MI.  Would  plan for definitive cardiac catheterization.  Risks and benefits of the  procedure were discussed.  She was willing to proceed.   HOSPITAL COURSE:  On October 24, 2003, Ms. Voit's enzymes were negative.  She was admitted for cardiac cath later that day.  On October 24, 2003, she  underwent a cardiac catheterization by Dr. Yates Decamp.  She was found to have  50% proximal and mid RCA stenosis.  No other significant coronary artery  disease.  EF was 60%.  See the report for details.  It was felt that the  chest pain was noncardiac, and the plans were to continue medical therapy.  She tolerated the procedure well without complications.   On October 25, 2003, she was still complaining of left-sided chest pain.  She said it does not feel like her usual acid reflux.  We obtained a spiral  CT.  This was subsequently negative for pulmonary embolus.  She was seen and  evaluated by Dr. Susa Griffins, who felt she was stable for discharge  home.   PHYSICAL EXAMINATION:  VITAL SIGNS:  At this point, her blood pressure is  122/62.  Other vital signs are stable.  are clear.  LUNGS:  Clear.  HEART:  Regular rhythm.  EXTREMITIES:  Her groin site is without hematoma or bleed.   HOSPITAL CONSULTS:  None.   HOSPITAL PROCEDURES:  1. Cardiac catheterization on  October 24, 2003:  Dr. Yates Decamp.  This     revealed nonobstructive CAD.  It was felt that she had noncardiac chest     pain.  We planned medical therapy.  She tolerated the procedure without     complications.  2. Status post spiral CT scan on October 25, 2003:  Negative for pulmonary     embolism.  3. Chest x-ray on October 23, 2003 shows mildly accentuated bronchial     markings and interstitial markings.  Stable.  No evidence for active     disease.  4. EKG shows sinus bradycardia at 45 beats per minute.  Nonspecific ST/T     change.   LABS:  Cardiac enzymes are negative x3 with CK of 42, 38, and 33.  CK-MB  1.4, 1.2, 1.0.  Troponin 0.1 x3.  Liver function tests are normal.  Sodium  is 142, potassium 4.4, chloride 106, CO2 28, glucose 185, BUN 8, creatinine  0.8.  White count 8.4, hemoglobin 16.0, hematocrit 46.9, platelets 147, PT  12.9, INR 1.0, PTT 30.   DISCHARGE MEDICATIONS:  1. Stop Toprol.  2. Increase Altace to 10 mg once daily.  3. Aspirin 81 mg once daily.  4. Zocor 80 mg once daily.  5. Lasix 20 mg as needed.  6. Advair 250/50 mg twice daily.  7. Combivent inhaler 2 puffs every 4 hours.  8. Humalog 10-15 units 3 times a day.  9. Lantus 50-70 units at night.  10.      Nexium 40 mg a day.  11.      Mucinex as needed.  12.      Nitroglycerin as directed.  13.      You can use ibuprofen, Motrin, etc., as you need for the next week.   No strenuous activity.  No lifting over 5 pounds, driving, or sexual  activity for 3 days.  A low cholesterol, diabetic diet.  Wash the groin site  with warm water and soap.  Call [phone] for any bleeding or increased pain  at the groin site.  [facility] Cardiolite on Monday, January 24th at 8:15  a.m.  Dr. Domingo Sep.  Keep your appointment for  February 3.   Note that when Dr. Alanda Amass saw Ms. Cazarez on November 25, 2003, which was a  day post cath, and she was still complaining of chest pain.  He recommended followup outpatient  Cardiolite to assess for possible ischemia in the RCA  territory.  At the time of cath, the stenoses did not appear to be high-  grade, but given her ongoing pain, we were not sure whether these were flow-  limiting and ischemic-inducing.      Mary B. Easley, P.A.-C.                   Dani Gobble, MD    MBE/MEDQ  D:  11/29/2003  T:  11/30/2003  Job:  [id number]   cc:   Angus G. Renard Matter, M.D.  [address]  [place]  [place] [address]  Fax: [phone]

## 2011-02-21 NOTE — Discharge Summary (Signed)
NAME:  [name], [name]                ACCOUNT NO.:  [id number]   MEDICAL RECORD NO.:  [id number]          PATIENT TYPE:  INP   LOCATION:  2857                         FACILITY:  [facility]   PHYSICIAN:  Darlin Priestly, MD  DATE OF BIRTH:  1946/01/18   DATE OF ADMISSION:  03/01/2007  DATE OF DISCHARGE:  03/02/2007                               DISCHARGE SUMMARY   FINAL DIAGNOSES:  1. Chest pain.  2. Known coronary artery disease, status post in the right coronary      artery.  3. Insulin dependent diabetes mellitus.  4. Dyslipidemia.  5. Tobacco use.   OPERATION/PROCEDURE:  Left heart catheterization and coronary  arteriogram were performed on Mar 02, 2007 by Dr. Yates Decamp.  Ejection  fraction was normal  60%.  There was no evidence of mitral  regurgitation.  There were two Cypher stents placed in the right  coronary artery in March and November 2005 which were patent.  There  were mild luminal irregularities noted in the last system.  Otherwise no  obstructive disease. Complications:  None.   BRIEF HISTORY:  [name] is a 65 year old female who was admitted on  Mar 01, 2007 with complaints of chest discomfort with associated  shortness of breath as well as nausea.  On arrival to the emergency room  at [facility],  EKG revealed normal sinus rhythm with ST-T wave  changes laterally.  She was then transferred to [facility] for further  evaluation and treatment.  She was started on IV heparin as well as IV  nitroglycerin and on arrival to [facility] was pain free.  She ruled out  for myocardial infarction and underwent left heart catheterization and  coronary arteriogram the following morning with the results as listed  above.  She tolerated the procedure without difficulty and it was  recommended that she stop smoking, continue her aspirin and Plavix and  better control over diabetes mellitus was recommended as well.  Dr.  Jacinto Halim did feel that she probably experienced unstable  anginal symptoms  given she does have significant cardiovascular risk and he felt that the  lesions may have stabilized with heparin and Integrilin.  After bed rest  for 4 hours, she was deemed ready for discharge.  Her groin site was  without hematoma and she had no complaints at that time.   DIET:  She is to follow a low-fat, low-cholesterol, low-sodium, diabetic  diet.   WOUND CARE:  She is to keep her groin site clean and dry and contact our  office with any redness, swelling or drainage from the site.   ACTIVITY:  She is to increase her activity slowly and may shower on April 03, 2007.  She is to avoid heavy lifting for 2 weeks and no driving for  the next 48 hours.   FOLLOW UP:  She is to follow up with Dr. Domingo Sep on March 30, 2007 at  2:00 p.m.   DISCHARGE MEDICATIONS:  1. Toprol XL 25 mg daily.  2. Benazepril 10 mg daily.  3. Aspirin 81 mg daily.  4.  Lantus 90 units each evening.  5. Evista 60 mg daily.  6..  Lasix 20 mg daily.  1. Omeprazole 20 mg daily.  2. Plavix 75 mg daily.  3. Vytorin 10/40 daily.  4. __________  10 to 20 units subcu as needed.  5. Fish oil once daily.   Strongly encouraged her to stop smoking.     ______________________________  Charmian Muff, NP      Darlin Priestly, MD  Electronically Signed    LS/MEDQ  D:  06/03/2007  T:  06/05/2007  Job:  [id number]   cc:   Dani Gobble, MD  Ishmael Holter. Renard Matter, MD

## 2011-02-21 NOTE — H&P (Signed)
NAME:  [name], [name]                          ACCOUNT NO.:  [id number]   MEDICAL RECORD NO.:  [id number]                   PATIENT TYPE:  INP   LOCATION:  IC09                                 FACILITY:  [facility]   PHYSICIAN:  Angus G. Renard Matter, M.D.              DATE OF BIRTH:  01-10-1946   DATE OF ADMISSION:  10/19/2003  DATE OF DISCHARGE:                                HISTORY & PHYSICAL   A 65 year old white female presented first to the office and then to the ED  today with anterior chest pain, which had developed through the night.  She  describes this as a tightness in the mid chest with radiation to the left  arm.  She took several nitroglycerin at home, and this relieved her symptoms  somewhat.  She developed also dyspnea and diaphoresis.  She was seen in the  emergency department and evaluated.  Electrocardiogram did not show acute  change.  Cardiac enzymes:  CPK 63, CPK-MB 1.1, troponin 0.01.  Other lab  studies:  CBC:  WBC 7900, hemoglobin 16.0, hematocrit 47.1.  Chemistry:  Sodium 136, potassium 3.9, chloride 105, CO2 24, glucose 294, creatinine  0.8, calcium 9.9.  Patient was given nitroglycerin several times in the  emergency department and given 5 mg of intravenous Lopressor.  Heart rate  diminished.  The pain was relieved somewhat.  The patient was thought to  have all of the cardiac risk factors.  It was felt that she should be  admitted to rule out unstable angina.  Patient had received treatment in  [place] today with steroid injection in the low back, treatment for  radiculopathy.   FAMILY HISTORY:  See previous records.   SOCIAL HISTORY:  Patient continues to smoke a pack of cigarettes daily.  Does not drink alcohol.   PAST MEDICAL/SURGICAL HISTORY:  Patient has a history of hypertension,  insulin-dependent diabetes, hyperlipidemia.  Has a history of coronary  artery disease with angina.  Has had two cardiac catheterizations, both of  which showed minimal  disease.  Patient has a history of cholecystectomy,  appendectomy, hysterectomy, and previous back surgery for ruptured  intervertebral disk.  Apparently has now three bulging disks on the left and  degenerative disk disease as well.   ALLERGIES:  NIASPAN.   MEDICATIONS:  1. Zocor 80 mg.  2. Altace 5 mg.  3. Nexium 40 mg.  4. Metoprolol 25 mg.  5. Patient uses insulin, Lantus 50-70 units daily.  6. Humalog insulin p.r.n.  7. Furosemide 20 mg.  8. Neurontin 300 mg t.i.d.   REVIEW OF SYSTEMS:  HEENT:  Negative.  CARDIOPULMONARY:  Patient has had  anterior chest pain, dyspnea, and diaphoresis for the last 12 hours prior to  admission.  GI:  No bowel irregularity, bleeding.  GU:  No dysuria or  hematuria.   PHYSICAL EXAMINATION:  VITAL SIGNS:  Blood pressure 195/85, pulse 113,  respirations  22, temp 98.  GENERAL:  An alert but uncomfortable white female.  HEENT:  Eyes:  PERRLA.  TMs are negative.  Oropharynx benign.  NECK:  Supple without JVD or thyroid abnormalities.  LUNGS:  Clear to P&A.  HEART:  Sinus tachycardia.  ABDOMEN:  No palpable organs or masses.  SKIN:  Warm and dry.  Free of edema.   DIAGNOSIS:  1. Chest pain, history of coronary artery disease.  Possible ischemic heart     disease.  2. Diabetes mellitus type 1.  3. Dyslipidemia.  4. Hypertension.  5. History of asthmatic bronchitis.  6. Degenerative joint disease.     ___________________________________________                                         Ishmael Holter. Renard Matter, M.D.   AGM/MEDQ  D:  10/19/2003  T:  10/19/2003  Job:  [id number]

## 2011-02-21 NOTE — Discharge Summary (Signed)
NAME:  [name], [name]                          ACCOUNT NO.:  [id number]   MEDICAL RECORD NO.:  [id number]                   PATIENT TYPE:  INP   LOCATION:  IC09                                 FACILITY:  [facility]   PHYSICIAN:  Angus G. Renard Matter, M.D.              DATE OF BIRTH:  1945/12/17   DATE OF ADMISSION:  10/19/2003  DATE OF DISCHARGE:  10/20/2003                                 DISCHARGE SUMMARY   DIAGNOSES:  1. Atypical chest pain.  2. Chronic bronchitis.  3. Chronic tobacco abuse.  4. No ischemic heart disease.  5. History of hypertension.  6. Insulin-dependent diabetes.  7. Dyslipidemia.   CONDITION:  Stable at the time of discharge.   A 65 year old white female who presented herself first in the office and  then to the ED with anterior chest pain which had developed through the  night. She describes this as a tightness in the mid chest with radiation  down her left arm. She took several nitroglycerin pills at home. This  relieved her symptoms somewhat. She also developed dyspnea and diaphoresis.  She was seen in the emergency department and evaluated. Electrocardiogram  did not show an acute change. Cardiac enzymes revealed CPK 63, CK-MB 1.1,  troponin 0.01.  Other laboratory tests with WBC of 7900, hemoglobin 16.0,  hematocrit 47.1. Chemistry showed sodium of 136, potassium 3.1, chloride  105, CO2 24, glucose 294.  The patient was given nitroglycerin several times  in the ED and was given 5 mg of IV Lopressor. The pain was relieved  somewhat. The patient was thought to have had all the cardiac risk factors  and she was admitted to rule out unstable angina. The patient had received  treatment in [place] today with steroid injections to the lower back for  radiculopathy.   PHYSICAL EXAMINATION:  VITAL SIGNS: Blood pressure 195/85, pulse 113,  respirations 22, temperature 98.  HEENT:  PERRLA. TMs negative.  Oropharynx benign.  NECK: Supple. No JVD. No thyroid  abnormalities.  LUNGS: Clear to P&A.  HEART: Sinus tachycardia.  ABDOMEN: No palpable organs or masses.  SKIN: Warm and dry.   Electrocardiogram reveals sinus tachycardia with a rate of 108. Chest x-ray  reveals peribronchial thickening throughout suggesting chronic bronchitis.   Subsequent cardiac enzymes show CPK of 47, CK-MB 1.2, troponin 0.1. Another  set of enzymes reveal CPK of 49, CK-MB 1.7, troponin 0.02.   HOSPITAL COURSE:  At the time of admission the patient was placed on a 2000  calorie ADA diet, Humalog insulin sliding scale, Accu-Chek a.c. and h.s.,  cardiac enzymes times three, cardiology consult with Dr. Domingo Sep. She was  given morphine 2-4 mg intravenously q.4h. p.r.n. pain. She is given a  heparin bolus of 5000 units intravenously and then heparin drip. She was  subsequently continued on Zocor 80 mg daily, Altace 5 mg daily, Nexium 40 mg  daily. The patient showed improvement  during her hospital stay. She was seen  in consultation by cardiology, [facility], and was felt to have atypical  chest pain of coronary artery disease by cardiac catheterization in 2002.  No ischemic EKG. Negative three sets of CK-MB. It was felt that she was at  low risk and felt she could be discharged.  She was admonished to stop  smoking. She is return to the emergency room if any additional problems  develop.  The patient is discharged and asked to continue home medications.     ___________________________________________                                         Ishmael Holter Renard Matter, M.D.   AGM/MEDQ  D:  11/14/2003  T:  11/14/2003  Job:  [id number]

## 2011-02-21 NOTE — Cardiovascular Report (Signed)
NAME:  [name], SLAGER                ACCOUNT NO.:  [id number]   MEDICAL RECORD NO.:  [id number]          PATIENT TYPE:  OIB   LOCATION:  6523                         FACILITY:  [facility]   PHYSICIAN:  Darlin Priestly, MD  DATE OF BIRTH:  06-02-1946   DATE OF PROCEDURE:  08/08/2004  DATE OF DISCHARGE:                              CARDIAC CATHETERIZATION   PROCEDURES:  1.  Coronary angiography.  2.  Right coronary artery - mid/distal - placement of intercoronary stent      x2.   ATTENDING:  Dr. Lenise Herald.   COMPLICATIONS:  None.   INDICATION:  Ms. [name] is a 65 year old female patient of Dr. Kem Boroughs  and Dr. Butch Penny with a history of diabetes, hypertension,  hyperlipidemia, history of COPD with history of tobacco use, history of  known RCA disease with remote flow wire Doppler interrogation revealing  noncritical disease.  She did undergo a repeat catheterization for ongoing  symptoms which revealed approximately 70% mid and distal RCA lesion on  July 26, 2004.  She is now brought for percutaneous intervention of the  RCA.   DESCRIPTION OF OPERATION:  After giving informed consent, patient brought to  the cardiac cath lab.  Right groin was shaved, prepped and draped in the  usual sterile fashion.  Anesthesia monitoring established using the modified  Seldinger technique with a #6 French arterial sheath in the right femoral  artery.  Next, a #6 [place] JR4 guiding catheter ___was coaxially engaged in  the right coronary ostium and selective angiogram was then performed.  This  confirmed sequential 70% lesions in the mid and distal RCA.  Next, a 0.014  ATW marker was advanced out of the guiding catheter and measurements were  obtained across the lesions.  The guide wire was then positioned in the  distal PLA without difficulty.  Next, a CYPHER 3.0 x 23 mm stent was then  passed across the distal lesion.  The stent was then deployed to a maximum  of 10 atmospheres  for a total of approximately 30 seconds.  A second  inflation to a maximum of 16 atmospheres was then performed for  approximately 30 seconds.  Followup angiogram revealed no evidence of  dissection or thrombus with TIMI-3 flow to the distal vessel.  This balloon  was then removed and a second CYPHER 30 x 23 mm stent was then positioned  across the mid RCA stenotic lesion with careful attention to overlap the  distal portion of the stent.  This stent was employed to a maximum of 12  atmospheres for a total of 28 seconds.  A second inflation was performed to  16 atmospheres for a total of 25 seconds.  The balloon was advanced across  the overlapping segment and one additional inflation to 16 atmospheres  performed for 20 seconds.  The followup angiogram revealed no evidence of  dissection or thrombus with TIMI-3 flow to the distal vessel.  IV Angiomax  was used throughout the case.   Final orthogonal angiograms reveal less than 10% residual stenosis in the  mid and distal RCA  stenotic lesions with TIMI-3 flow into the distal vessel.  At this point we elected to conclude the procedure.  All balloons,wires,and  catheters were removed.  Hemostatic sheaths were sewn in place and the  patient transferred back to the ward in stable condition.   CONCLUSIONS:  1.  Successful placement a CYPHER 3.0 x 23, CYPHER 3.0 x 23 in the distal,      mid RCA stenotic lesions respectively.  2.  Adjunct use of Angiomax infusion.      Robe   RHM/MEDQ  D:  08/08/2004  T:  08/08/2004  Job:  [id number]   cc:   Angus G. Renard Matter, MD  [address]  [place]  [place] [address]  Fax: [phone]   Dani Gobble, MD  Fax: [phone]

## 2011-02-21 NOTE — Cardiovascular Report (Signed)
NAMELAUREEN, [name] NO.:  [id number]   MEDICAL RECORD NO.:  [id number]          PATIENT TYPE:  INP   LOCATION:  2017                         FACILITY:  [facility]   PHYSICIAN:  Nanetta Batty, M.D.   DATE OF BIRTH:  06-18-1946   DATE OF PROCEDURE:  09/09/2004  DATE OF DISCHARGE:                              CARDIAC CATHETERIZATION   INDICATIONS:  [name] is a 65 year old moderately overweight white female  patient of Dr. Domingo Sep with history of hypertension, insulin dependent  diabetes, chronic obstructive pulmonary disease with ongoing tobacco abuse  and dyslipidemia.  She had two RCA stents placed by Dr. Jenne Campus August 08, 2004.  She developed chest pain after church yesterday and was transferred  from [facility] Room with IV nitro and heparin.  The patient ruled  out for myocardial infarction by isoenzymes and had no acute EKG changes.  She presents now for diagnostic coronary arteriography.   PROCEDURE DESCRIPTION:  The patient was brought to the second floor [facility] in the postabsorptive state.  She was  premedicated with p.o. Valium.  Right groin was prepped and shaved in the  usual sterile fashion.  1% Xylocaine was used for local anesthesia.  A 6  French sheath was inserted into the right femoral artery using standard  Seldinger technique. A 6 French right and left Judkins diagnostic catheter  as well as 6 French pigtail catheter were used for selective coronary  angiography, left ventriculography, respectively.  Visipaque dye was used  for the entirety of the case.  Retrograde aortic, left ventricular pullback  pressures were recorded.   HEMODYNAMICS:  1.  Aortic systolic pressure 185, diastolic pressure 83.  2.  Left ventricular systolic pressure 173, end-diastolic pressure 26.  3.  There did not appear to be a pullback gradient.   SELECTIVE CORONARY ANGIOGRAPHY:  1.  Left main normal.  2.  LAD normal.  3.  Left circumflex normal.  4.  Right coronary artery was dominant with patent stents and otherwise no      significant disease.   LEFT VENTRICULOGRAPHY:  RAO left ventriculogram was performed using 25 mL of  Visipaque dye at 12 mL per second.  The overall LVEF is estimated at greater  than 60% without focal wall motion abnormalities.   IMPRESSION:  [name] has widely patent stents with no evidence of coronary  artery disease and normal left ventricular function.  I believe her chest  pain is noncardiac.  Empiric anti-reflux therapy will be recommended and the  patient will be discharged home later today and will see Dr. Kem Boroughs  back in followup.  She left the lab in stable condition.      JB/MEDQ  D:  09/09/2004  T:  09/09/2004  Job:  [id number]   cc:   [facility]  [address]  [place], [place] [address]   Dani Gobble, MD  Fax: [phone]   Angus G. Renard Matter, MD  [address]  [place]  [place] [address]  Fax: [phone]

## 2011-02-21 NOTE — H&P (Signed)
NAME:  [name], [name] NO.:  [id number]   MEDICAL RECORD NO.:  [id number]          PATIENT TYPE:  INP   LOCATION:  2017                         FACILITY:  [facility]   PHYSICIAN:  Ulyses Amor, MD DATE OF BIRTH:  06-Sep-1946   DATE OF ADMISSION:  09/09/2004  DATE OF DISCHARGE:                                HISTORY & PHYSICAL   REASON FOR ADMISSION:  [name] is a 65 year old white female who is  transferred from [facility] to [facility] for  further evaluation of chest pain.   HISTORY OF PRESENT ILLNESS:  The patient has a history of having undergone  the deployment of two stents in the right coronary artery (proximal and mid)  one month ago.  She had previously demonstrated to have noncritical right  coronary artery disease.  The patient's course has been uncomplicated for  the last month until today when she experienced chest pain.  Chest pain  began while she was at church this morning.  It was described as sharp and  achy discomfort in the left anterior chest.  It was similar in quality to  the chest that proceeded her stent last month.  It was associated with mild  dyspnea, mild nausea, and no diaphoresis.  There were no exacerbation or  ameliorating factors.  It appeared not to be related to position, activity,  meals, or respiration.  It radiated down the left arm.  It continued in a  low grade fashion throughout the course of the afternoon and ultimately  prompted her visit this evening to [facility] Emergency Department.  She was evaluated there and transferred to [facility].   Her chest pain has since resolved.  She has been treated with heparin and  intravenous nitroglycerin.   There was no history of congestive heart failure or arrhythmia.  The patient  has a number of risk factors for coronary artery disease including insulin-  dependent diabetes mellitus, dyslipidemia, hypertension, a  family history of  coronary artery disease, and continued cigarette smoking.   MEDICATIONS:  The patient is on a number of medications for the  aforementioned medical problems.  These include Altace, insulin, Toprol XL,  Combivent, Advair, Nexium, Norvasc, furosemide, and Plavix.   ALLERGIES:  She is not allergic to any medications.   PAST SURGICAL HISTORY:  Back surgery and left knee surgery.   SIGNIFICANT INJURIES:  None.   FAMILY HISTORY:  Notable for coronary artery disease and congestive heart  failure.   SOCIAL HISTORY:  The patient lives with her husband.  She does not drink.  She smokes as described above.   REVIEW OF SYMPTOMS:  Reveals no new problems related to her head, eyes,  ears, nose, mouth, throat, lungs, gastrointestinal system, genitourinary  system, or extremities.  There is no history of neurologic or psychiatric  disorder.  There was no history of fever, chills, or weight loss.   PHYSICAL EXAMINATION:  VITAL SIGNS:  Blood pressure 115/64, pulse 54 and  regular, respirations 22, temperature 97.3.  GENERAL:  The patient was an  obese, middle-aged white woman in no  discomfort.  She was alert, oriented, appropriate, and responsive.  HEENT:  Normal.  NECK:  Without thyromegaly or adenopathy.  Carotid pulses were palpable  bilaterally and without bruits.  CARDIOVASCULAR:  Normal S1 and S2.  There was no S3, S4, murmur, rub, or  click.  Cardiac rhythm was regular.  No chest wall tenderness was noted.  LUNGS:  Clear.  ABDOMEN:  Soft and nontender.  There was no mass, hepatosplenomegaly, bruit,  distention, rebound, guarding, or rigidity.  Bowel sounds were normal.  BREASTS:  Not performed as they were not pertinent to the reason for acute  care hospitalization.  PELVIC:  Not performed as they were not pertinent to the reason for acute  care hospitalization.  RECTAL:  Not performed as they were not pertinent to the reason for acute  care hospitalization.   EXTREMITIES:  Without edema, deviation, or deformity.  Radial and dorsalis  pedis pulses were palpable bilaterally.  NEUROLOGICAL:  Brief screening neurologic survey was unremarkable.   LABORATORY DATA:  The electrocardiogram from [facility] revealed  normal sinus rhythm.  Left axis deviation was present.  There were mild ST  segment abnormalities in the anterolateral leads.  The chest radiograph  report was pending at the time of this dictation.   Potassium was 3.7, BUN 8, and creatinine 0.7.  White count was 7.3 with a  hemoglobin of 14.6 and hematocrit 42.1.  The initial set of cardiac markers  revealed a myoglobin of 31.5, CK-MB 1.0, and troponin less than 0.05.  The  second set revealed a myoglobin of 30.5, CK-MB less than 1.0, and troponin  less than 0.05.  The remaining studies were pending at the time of this  dictation.   IMPRESSION:  1.  Chest pain:  Rule out unstable angina.  The pain is similar in quality      to that which preceded her recent stents.  2.  Coronary artery disease:  One month status post proximal and mid-right      coronary artery stent.  3.  Hypertension.  4.  Insulin-dependent diabetes mellitus.  5.  Chronic obstructive pulmonary disease.  6.  Dyslipidemia.   PLAN:  1.  Telemetry.  2.  Serial cardiac enzymes.  3.  Continue aspirin, Plavix, and Toprol XL.  4.  Intravenous heparin.  5.  Intravenous nitroglycerin.  6.  Cardiac catheterization tomorrow per Dr. Nanetta Batty.      Mitc   MSC/MEDQ  D:  09/09/2004  T:  09/09/2004  Job:  [id number]   cc:   Nanetta Batty, M.D.  Fax: [phone]

## 2011-02-21 NOTE — Group Therapy Note (Signed)
NAME:  [name], [name]                          ACCOUNT NO.:  [id number]   MEDICAL RECORD NO.:  [id number]                   PATIENT TYPE:  INP   LOCATION:  IC09                                 FACILITY:  [facility]   PHYSICIAN:  Angus G. Renard Matter, M.D.              DATE OF BIRTH:  November 28, 1945   DATE OF PROCEDURE:  10/20/2003  DATE OF DISCHARGE:                                   PROGRESS NOTE   This patient was admitted to the hospital with anterior chest pain.  She  remains on IV heparin.  She remained fairly pain free through the evening.   OBJECTIVE:  VITAL SIGNS: Blood pressure 115/58, respirations 18, pulse 58,  temperature 98. HEART:  Regular rhythm. LUNGS:  Clear to P&A.  ABDOMEN:  No  palpable organs or masses.   ASSESSMENT:  1. The patient was admitted with precordial chest pain with radiation to the     neck.  2. She does have history of diabetes.  3. History of previous coronary artery disease and previous cardiac     catheterization.   PLAN:  Plan to continue current regimen. The patient will be seen today by  [facility].      ___________________________________________                                            Ishmael Holter Renard Matter, M.D.   AGM/MEDQ  D:  10/20/2003  T:  10/20/2003  Job:  [id number]

## 2011-02-21 NOTE — Discharge Summary (Signed)
[name], [name]                ACCOUNT NO.:  [id number]   MEDICAL RECORD NO.:  [id number]          PATIENT TYPE:  INP   LOCATION:  2017                         FACILITY:  [facility]   PHYSICIAN:  Nanetta Batty, M.D.   DATE OF BIRTH:  October 26, 1945   DATE OF ADMISSION:  09/09/2004  DATE OF DISCHARGE:  09/09/2004                                 DISCHARGE SUMMARY   DISCHARGE DIAGNOSES:  1.  Chest pain, etiology undetermined.  Status post catheterization this      admission, with no interventions.  2.  Known coronary artery disease, status post recent right coronary artery      stenting on August 08, 2004.  3.  Hypertension, controlled.  4.  Insulin-dependent diabetes mellitus.  5.  Chronic obstructive pulmonary disease.  6.  Dyslipidemia.   HISTORY OF PRESENT ILLNESS AND HOSPITAL COURSE:  This is a 65 year old  Caucasian lady, a patient of Dr. Domingo Sep, who recently underwent coronary  angiography and stenting of the RCA by Dr. Jenne Campus on August 08, 2004 and  developed chest pain over the weekend, and gradually the pain worsened to  the point when the patient had to present to the emergency room at [facility].  At the emergency room at [facility], she was  evaluated and transferred to [facility] for further evaluation of  her complaints and further cardiac care.   We cycled enzymes, and all three sets of cardiac panel were negative.  Her  EKG on admission did not show any changes except sinus bradycardia.   The patient was scheduled for cardiac catheterization for differential  diagnosis and ruling out early in-stent restenosis of the RCA, and this  procedure was scheduled on December 5 and performed by Dr. Allyson Sabal.  The  catheterization showed normal left ventricular function and Cypher stent in  the RCA, and stented area was widely patent.  The patient tolerated coronary  angiography well, without any complications, and transferred to the holding  area  and later to her room on the telemetry floor in stable condition.   Her laboratories in the hospital revealed white blood cell count 7.6,  hemoglobin 13.5, hematocrit 39.8, platelet count 122.  BMP showed sodium  138, potassium 3.7, chloride 102, CO2 27, glucose 284, BUN 84, creatinine  0.7.  BNP was 42.2.   DISCHARGE MEDICATIONS:  1.  Combivent and Advair, resume home doses.  2.  Altace 5 mg daily.  3.  Plavix 75 mg daily.  4.  Norvasc 5 mg daily.  5.  Nexium 40 mg daily.  6.  Vytorin 10/40 mg daily.  7.  Toprol XL 25 mg daily.  8.  Furosemide 20 mg daily.  9.  Aspirin 81 mg daily.  10. Lantus and Humalog, also resume home doses.   DISCHARGE ACTIVITIES:  No driving.  No heavy lifting greater than 5 pounds.  No strenuous activities for three days postcatheterization.   DISCHARGE DIET:  Lowfat, low cholesterol diet.   DISCHARGE FOLLOWUP:  She was advised to call the office to make an  appointment with Dr. Domingo Sep,  to be seen in a couple weeks.   The patient will ambulate around the ward, and if her groin site continues  to be stable, no signs of bruising or hematoma, she will be discharged home  later this afternoon.      MK/MEDQ  D:  09/09/2004  T:  09/10/2004  Job:  [id number]   cc:   [facility]

## 2011-02-21 NOTE — H&P (Signed)
[facility]  Patient:    [name], [name] Visit Number: [id number] MRN: [id number]          Service Type: Dictated by:   Ottie Glazier. Wynona Neat, P.A.-C. Adm. Date:  06/30/01                           History and Physical  CHIEF COMPLAINT:  Left knee pain.  HISTORY OF PRESENT ILLNESS:  Ms. Grilli is a 65 year old female with a long history of left knee pain for approximately four years.  She is status post left knee scope approximately five years ago.  She is status post multiple conservative treatment, injections, and NSAIDs.  The patient states that her knee pain has progressed now to where it is interfering with her activities of daily living and ambulation.  After discussion with Philips J. Montez Morita, M.D., the decision was made to proceed with operative intervention.  ALLERGIES:  No known drug allergies.  MEDICATIONS: 1. Wellbutrin SR 150 mg p.o. b.i.d. 2. Lantus IM 80 units at night. 3. Humalog as needed. 4. Zocor 80 mg q.d. 5. Altace 10 mg q.d. 6. Nexium 40 mg q.d. 7. Toprol XL 25 mg p.o. q.d. 8. Nicoderm CQ 21 mg. 9. Combivent inhaler p.r.n.  PAST MEDICAL HISTORY:  Significant for COPD, CAD, hiatal hernia, diabetes mellitus type 1, cervical disk disease, and osteoarthritis.  PAST SURGICAL HISTORY:  Bilateral tubal ligation, appendectomy, hysterectomy, BSO, cholecystectomy, bilateral carpal tunnel release, tonsillectomy, and right knee scope.  SOCIAL HISTORY:  She is married with four children.  She smoked approximately four packs per day for multiple years.  Now she only smokes approximately 10 cigarettes a day.  She denies alcohol use.  Lives in a one-level home with five steps to the entrance.  She does not have any rehabilitation plans, but wants [facility] to follow up with her.  FAMILY HISTORY:  Mother had throat cancer, CHF, and cervical cancer.  Her father is deceased with CHF and emphysema.  REVIEW OF SYSTEMS:   General:  She denies any recent weight loss, fever, chills, night sweats, and decrease in appetite.  HEENT:  She wears glasses. No chronic headache, sore throat, ringing of the ears, or runny nose.  Chest: She has a small cough at time.  Occasional shortness of breath with activity. No productive cough.  No hemoptysis.  Cardiovascular:  No chest pain.  She states that she has "occasional flutter."  GU/GI:  She has a history of diarrhea and lactose intolerance.  No constipation.  No dysuria, frequency, or urgency.  Extremities:  Please see HPI.  Neurologic:  Denies any history of seizures, strokes, paralysis, or paresthesias.  PHYSICAL EXAMINATION:  This is a pleasant, 65 year old, white female appearing her stated age in no acute distress.  HEENT:  The head is normocephalic and atraumatic.  NECK:  Supple without masses.  CHEST:  Clear to auscultation bilaterally.  HEART:  Regular rate and rhythm.  ABDOMEN:  Soft, nontender.  No guarding or rebound.  EXTREMITIES:  She has medial palpable patellar tenderness.  Positive crepitants.  No effusion noted.  Range of motion is intact.  SKIN:  Within normal limits.  LABORATORY DATA AND X-RAYS:  Advanced degenerative changes in the medial and patellar femoral compartments and early osteonecrosis on the medial side.  IMPRESSION:  Left knee end-stage osteoarthritis.  PLAN:  The patient will be admitted to [facility] to undergo a left total knee arthroplasty by Philips  Rochele Raring, M.D., on June 30, 2001, at 7:30 a.m.  The patient requests a spinal.  She does need preoperative clearance from Lenise Herald, M.D., her cardiologist, prior the procedure. All questions were encouraged and answered per Dr. Montez Morita.  The risks and benefits were discussed with her to which she stated good understanding prior to entering the operative suite. Dictated by:   Ottie Glazier. Wynona Neat, P.A.-C. DD:  06/29/01 TD:  06/29/01 Job:  [id number]

## 2011-02-21 NOTE — Op Note (Signed)
NAME:  [name], [name]                ACCOUNT NO.:  [id number]   MEDICAL RECORD NO.:  [id number]          PATIENT TYPE:  AMB   LOCATION:  DAY                           FACILITY:  [facility]   PHYSICIAN:  R. Roetta Sessions, M.D. DATE OF BIRTH:  10-May-1946   DATE OF PROCEDURE:  12/03/2005  DATE OF DISCHARGE:                                 OPERATIVE REPORT   PROCEDURE:  Esophagogastroduodenoscopy with Elease Hashimoto dilation followed by  biopsy followed colonoscopy with biopsy.   INDICATIONS FOR PROCEDURE:  The patient is a 65 year old lady with  postprandial left upper quadrant abdominal pain. She has a tendency toward  constipation and long-standing gastroesophageal reflux disease, early  satiety, worsening nausea and vomiting. Recent CT demonstrated questionable  thickening of left colon and rectum. She was on Propulsid years ago and did  very well. She had some ____________ prokinetic therapy. CT scan recently  demonstrated moderate to marked some wall thickening of the distal  esophagus, gallbladder is out. She has some advanced changes of  atherosclerosis of the abdominal aorta and branch vessels. EGD and  colonoscopy are now being done. She also reports esophageal dysphagia  intermittently to me today. EGD and colonoscopy are now being done. This  approach has been discussed with the patient at length. Potential risks,  benefits, and alternatives have been reviewed and questions answered. She is  agreeable. Please see documentation in the medical record.   PROCEDURE NOTE:  O2 saturation, blood pressure, pulse, and respirations were  monitored throughout the entire procedure. Conscious sedation with Versed 5  mg IV and Demerol 125 mg IV in divided doses.  Cetacaine spray for topical  oropharyngeal anesthesia.   INSTRUMENT:  Olympus video chip system.   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 Esophageal mucosa  otherwise  appeared normal except for multiple 1 to 2 mm pale raised nodules. EG  junction easily traversed.   Stomach:  Gastric cavity insufflated well with air. There was some retained  food in the stomach. It was easily washed and suctioned out. Thorough  examination of gastric mucosa including retroflexed view of the proximal  stomach and esophagogastric junction demonstrated a small verrucous-like  lesion up in the fundus about 3-cm in diameter. It looked like a barrel  lesion to me. The patient had a small hiatal hernia. Otherwise gastric  mucosa appeared normal. Pylorus patent and easily traversed. Examination of  bulb and second portion revealed no abnormalities.   THERAPEUTIC/DIAGNOSTIC MANEUVERS:  A 56-French Maloney dilator was pushed to  full insertion. A look back revealed no apparent complication related to  passage of the dilator. Subsequently, one of the small nodules in the  esophagus was biopsied, and the lesion at the fundus was also biopsied and  essentially removed.   The patient tolerated the procedure well and was prepared for colonoscopy.  Digital rectal revealed no abnormalities.   ENDOSCOPIC FINDINGS:  Prep was adequate.   Rectum:  Examination of the rectal mucosa including retroflexed view of the  anal verge revealed a 4-mm polyp in at 5  cm. The remainder of the rectal  mucosa appeared normal.   Colon:  Colonic mucosa was surveyed from the rectosigmoid junction through  the left, transverse, and right colon to the area of the appendiceal  orifice, ileocecal valve, and cecum. These structures were well seen and  photographed for the record. From this level, the scope was slowly  withdrawn, and all previously mentioned mucosal surfaces were again seen.  The patient had a couple of diminutive polyps in the mid descending colon.  The remainder of the colonic mucosa appeared normal. These two polyps were  removed with cold biopsy forceps. The rectal polyp was  also removed with  cold biopsy forceps. The patient tolerated both procedures well and was  reactive to endoscopy.   IMPRESSION:  Esophagogastroduodenoscopy:  1.  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  2.  Verrucous lesion in the fundus, most likely a barrel lesion, very small,      essentially removed with cold-biopsy forceps. Small hiatal hernia. Some      retained food in the stomach consistent with her prior history of      gastroparesis. Patent pylorus. Normal D1 and D2.   Colonoscopy findings:  Diminutive polyp in the rectum and mid descending  colon. They were cold biopsied/removed. The remainder of rectal and colonic  mucosa appeared normal.   RECOMMENDATIONS:  1.  Follow up on pathology.  2.  Continue Nexium 40 mg orally daily.  3.  We will begin some Reglan 5 mg a.c. and h.s. (t.i.d.). The patient was      warned about the short and long term side effects. She is to watch for      them closely.  4.  We will plan to see this nice lady back in the office in six weeks and      see how she is doing. We will follow up on the pathology in the interim.      Jonathon Bellows, M.D.  Electronically Signed     RMR/MEDQ  D:  12/03/2005  T:  12/04/2005  Job:  [id number]   cc:   Angus G. Renard Matter, MD  Fax: [phone]   Malvin Johns, M.D.

## 2011-02-21 NOTE — Op Note (Signed)
   NAME:  SHAHARA, HARTSFIELD                          ACCOUNT NO.:  [id number]   MEDICAL RECORD NO.:  [id number]                   PATIENT TYPE:  INP   LOCATION:  3013                                 FACILITY:  [facility]   PHYSICIAN:  Hilda Lias, M.D.                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   PROCEDURE:  Right L4-L5 laminotomy, decompression of the L4 nerve root,  microscope.   SURGEON:  Hilda Lias, M.D.   CLINICAL HISTORY:  The patient was admitted because of back and right leg  pain for several months.  The patient had failed conservative treatment.  X-  rays show stenosis at L4-L5.  There was also some findings in the left side  but she had no complaints in the left side.  The patient wanted to proceed  with surgery after she failed conservative treatment.   PROCEDURE:  The patient was taken to the OR and she was positioned in a  prone manner.  The back was prepped with Betadine.  A midline incision from  L4 to L5 was made on the right side.  With the x-ray, we identified that we  were at L4-L5.  Then, with the blade, we removed the lower lamina of L4 and  the upper of L5.  We found that the L4-L5 was hypertrophied with stenosis  compromising mostly the L5 nerve root.  Part of the medial facet was removed  with the 1, 2, and 3 mm Kerrison punch with decompress at the L5 nerve root  going all the way down to the foramen.  The same was done at the level of  the L4 nerve root.  We found that the disc was bulging with some  calcification but there was no need to do a discectomy.  Tisseel was left in  the space.  The area was irrigated.  The wound  was closed with Vicryl and  with Steri-Strips.  The patient will go to her room.                                               Hilda Lias, M.D.    EB/MEDQ  D:  07/19/2003  T:  07/19/2003  Job:  [id number]

## 2011-02-21 NOTE — H&P (Signed)
NAME:  [name], [name]                          ACCOUNT NO.:  [id number]   MEDICAL RECORD NO.:  [id number]                   PATIENT TYPE:  INP   LOCATION:  2890                                 FACILITY:  [facility]   PHYSICIAN:  Hilda Lias, M.D.                DATE OF BIRTH:  12-Jun-1946   DATE OF ADMISSION:  07/19/2003  DATE OF DISCHARGE:                                HISTORY & PHYSICAL   HISTORY OF PRESENT ILLNESS:  [name] is a lady who was seen by me a month  ago because of back pain with radiation down to the right hip and then  posterolaterally to the right foot for several months.  The patient tells me  that the pain is getting worse.  She has had conservative treatment  including medications, physical therapy, and she feels that she is unable to  continue with this pain.  The patient had  x-rays and because of the  findings, she wants to proceed with surgery.  The lumbar myelogram was done  as an outpatient.   ALLERGIES:  No known drug allergies.   PAST MEDICAL HISTORY:  1. Anterior cervical discectomy a year ago.  2. Total knee replacement on the left side.  3. Both wrists.   SOCIAL HISTORY:  She smokes a pack a day.  She does not drink.   FAMILY HISTORY:  Unremarkable.   REVIEW OF SYSTEMS:  This is positive for angina, high blood pressure,  asthma, chronic cough, shortness of breath, bronchitis, UTI, difficulty  urinating, kidney stones, back pain, neck pain, and diabetes.   PHYSICAL EXAMINATION:  GENERAL:  The patient came to my office and she was  limping slightly on the right leg.  HEENT:  Normal.  NECK:  Normal. She has scar from previous surgery.  She has a little bit of  discomfort with lateralization.  LUNGS:  There are some rhonchi bilaterally.  CARDIOVASCULAR:  Heart sounds normal.  ABDOMEN:  Normal.  EXTREMITIES:  Normal pulses.  NEUROLOGICAL:  Mental status.  Cranial nerves normal.  Sensation:  She  complains of numbness on top of the right foot.   Reflexes are 1+, no  Babinski.  Strength:  She has weakness with dorsiflexion of the right foot.   DIAGNOSTIC STUDIES:  The lumbar myelogram shows that she has stenosis at  multiple levels, mostly on the right side.  At the level of L4-5, we found  that she has thickening of the ilial ligament producing foraminal stenosis  and also at the level of L3-4 but going to the left side.   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   RECOMMENDATIONS:  The patient only complained of pain in the right leg.  We  are going to proceed with right L4-L5 hemilaminotomies with foraminotomy.  The patient knows about the risks such as infection, CSF leak,  worsening of pain,  paralysis, need for another surgery which might require  fusion.  She is fully aware that although she has some findings on the left  side at this time because she has no complaints of left leg pain, we are  going to proceed only with surgery on the right side.                                                Hilda Lias, M.D.    EB/MEDQ  D:  07/19/2003  T:  07/19/2003  Job:  [id number]

## 2011-02-24 ENCOUNTER — Encounter (HOSPITAL_COMMUNITY): Payer: Medicare PPO | Attending: Oncology

## 2011-02-24 DIAGNOSIS — Z452 Encounter for adjustment and management of vascular access device: Secondary | ICD-10-CM | POA: Insufficient documentation

## 2011-03-20 ENCOUNTER — Encounter (HOSPITAL_BASED_OUTPATIENT_CLINIC_OR_DEPARTMENT_OTHER): Payer: Medicare PPO | Admitting: Internal Medicine

## 2011-03-20 ENCOUNTER — Ambulatory Visit (HOSPITAL_COMMUNITY)
Admission: RE | Admit: 2011-03-20 | Discharge: 2011-03-20 | Disposition: A | Discharge status: Home / Self Care | Payer: Medicare PPO | Source: Ambulatory Visit | Attending: Internal Medicine | Admitting: Internal Medicine

## 2011-03-20 ENCOUNTER — Other Ambulatory Visit: Payer: Self-pay | Admitting: Internal Medicine

## 2011-03-20 ENCOUNTER — Encounter (HOSPITAL_COMMUNITY): Payer: Self-pay

## 2011-03-20 DIAGNOSIS — Z85118 Personal history of other malignant neoplasm of bronchus and lung: Secondary | ICD-10-CM

## 2011-03-20 DIAGNOSIS — R05 Cough: Secondary | ICD-10-CM | POA: Insufficient documentation

## 2011-03-20 DIAGNOSIS — J8410 Pulmonary fibrosis, unspecified: Secondary | ICD-10-CM | POA: Insufficient documentation

## 2011-03-20 DIAGNOSIS — J4489 Other specified chronic obstructive pulmonary disease: Secondary | ICD-10-CM | POA: Insufficient documentation

## 2011-03-20 DIAGNOSIS — K7689 Other specified diseases of liver: Secondary | ICD-10-CM | POA: Insufficient documentation

## 2011-03-20 DIAGNOSIS — J449 Chronic obstructive pulmonary disease, unspecified: Secondary | ICD-10-CM | POA: Insufficient documentation

## 2011-03-20 DIAGNOSIS — Z902 Acquired absence of lung [part of]: Secondary | ICD-10-CM | POA: Insufficient documentation

## 2011-03-20 DIAGNOSIS — C3490 Malignant neoplasm of unspecified part of unspecified bronchus or lung: Secondary | ICD-10-CM

## 2011-03-20 DIAGNOSIS — R059 Cough, unspecified: Secondary | ICD-10-CM | POA: Insufficient documentation

## 2011-03-20 DIAGNOSIS — J984 Other disorders of lung: Secondary | ICD-10-CM | POA: Insufficient documentation

## 2011-03-20 LAB — CBC WITH DIFFERENTIAL/PLATELET
BASO%: 0.3 % (ref 0.0–2.0)
EOS%: 3.1 % (ref 0.0–7.0)
LYMPH%: 27.9 % (ref 14.0–49.7)
MCH: 28.2 pg (ref 25.1–34.0)
MCHC: 34 g/dL (ref 31.5–36.0)
MONO#: 0.3 10*3/uL (ref 0.1–0.9)
MONO%: 6.1 % (ref 0.0–14.0)
Platelets: 124 10*3/uL — ABNORMAL LOW (ref 145–400)
RBC: 5.24 10*6/uL (ref 3.70–5.45)
WBC: 5.3 10*3/uL (ref 3.9–10.3)

## 2011-03-20 LAB — CMP (CANCER CENTER ONLY)
ALT(SGPT): 19 U/L (ref 10–47)
AST: 20 U/L (ref 11–38)
Alkaline Phosphatase: 98 U/L — ABNORMAL HIGH (ref 26–84)
CO2: 27 mEq/L (ref 18–33)
Sodium: 139 mEq/L (ref 128–145)
Total Bilirubin: 0.5 mg/dl (ref 0.20–1.60)
Total Protein: 7.2 g/dL (ref 6.4–8.1)

## 2011-03-20 MED ORDER — IOHEXOL 300 MG/ML  SOLN
80.0000 mL | Freq: Once | INTRAMUSCULAR | Status: AC | PRN
  Administered 2011-03-20: 80 mL via INTRAVENOUS

## 2011-03-25 ENCOUNTER — Encounter (HOSPITAL_BASED_OUTPATIENT_CLINIC_OR_DEPARTMENT_OTHER): Payer: Medicare PPO | Admitting: Internal Medicine

## 2011-03-25 ENCOUNTER — Other Ambulatory Visit: Payer: Self-pay | Admitting: Internal Medicine

## 2011-03-25 DIAGNOSIS — C3490 Malignant neoplasm of unspecified part of unspecified bronchus or lung: Secondary | ICD-10-CM

## 2011-03-25 DIAGNOSIS — C3410 Malignant neoplasm of upper lobe, unspecified bronchus or lung: Secondary | ICD-10-CM

## 2011-03-25 DIAGNOSIS — F17200 Nicotine dependence, unspecified, uncomplicated: Secondary | ICD-10-CM

## 2011-04-07 ENCOUNTER — Encounter (HOSPITAL_COMMUNITY): Payer: Medicare Other | Attending: Oncology

## 2011-04-07 DIAGNOSIS — Z452 Encounter for adjustment and management of vascular access device: Secondary | ICD-10-CM | POA: Insufficient documentation

## 2011-05-26 ENCOUNTER — Encounter (HOSPITAL_COMMUNITY): Payer: Medicare Other | Attending: Oncology

## 2011-05-26 DIAGNOSIS — C3490 Malignant neoplasm of unspecified part of unspecified bronchus or lung: Secondary | ICD-10-CM | POA: Insufficient documentation

## 2011-05-26 MED ORDER — HEPARIN SOD (PORK) LOCK FLUSH 100 UNIT/ML IV SOLN
INTRAVENOUS | Status: AC
  Administered 2011-05-26: 500 [IU] via INTRAVENOUS
  Filled 2011-05-26: qty 5

## 2011-05-26 MED ORDER — SODIUM CHLORIDE 0.9 % IJ SOLN
10.0000 mL | Freq: Once | INTRAMUSCULAR | Status: AC
  Administered 2011-05-26: 10 mL via INTRAVENOUS

## 2011-05-26 MED ORDER — HEPARIN SOD (PORK) LOCK FLUSH 100 UNIT/ML IV SOLN
500.0000 [IU] | Freq: Once | INTRAVENOUS | Status: AC
  Administered 2011-05-26: 500 [IU] via INTRAVENOUS

## 2011-05-26 NOTE — Progress Notes (Signed)
[name] presented for Portacath access and flush. Proper placement of portacath confirmed by CXR. Portacath located lt chest wall accessed with  H 20 needle. Good blood return present. Portacath flushed with 20ml NS and 500U/19ml Heparin and needle removed intact. Procedure without incident. Patient tolerated procedure well.

## 2011-07-03 LAB — CBC
HCT: 47.7 — ABNORMAL HIGH
Platelets: 131 — ABNORMAL LOW
RDW: 13.9
WBC: 6.9

## 2011-07-03 LAB — PROTIME-INR
INR: 0.9
Prothrombin Time: 12

## 2011-07-07 ENCOUNTER — Encounter (HOSPITAL_COMMUNITY): Payer: Medicare Other | Attending: Oncology

## 2011-07-07 DIAGNOSIS — C3490 Malignant neoplasm of unspecified part of unspecified bronchus or lung: Secondary | ICD-10-CM | POA: Insufficient documentation

## 2011-07-07 LAB — CBC
HCT: 40.8
HCT: 42
HCT: 43.9
Hemoglobin: 14.2
Hemoglobin: 14.6
Hemoglobin: 15.9 — ABNORMAL HIGH
MCV: 82.9
RBC: 4.92
RBC: 5.11
RBC: 5.65 — ABNORMAL HIGH
RDW: 13.4
RDW: 14
WBC: 5.9
WBC: 7
WBC: 8.1
WBC: 7

## 2011-07-07 LAB — BASIC METABOLIC PANEL
BUN: 7
CO2: 22
Calcium: 9
Chloride: 102
Chloride: 110
Creatinine, Ser: 0.63
GFR calc Af Amer: >60
GFR calc Af Amer: >60
GFR calc non Af Amer: >60
Glucose, Bld: 78
Potassium: 3.3 — ABNORMAL LOW
Potassium: 3.5
Sodium: 140
Sodium: 140

## 2011-07-07 LAB — DIFFERENTIAL
Lymphocytes Relative: 32
Monocytes Absolute: 0.5
Monocytes Relative: 7
Neutro Abs: 4

## 2011-07-07 LAB — FUNGUS CULTURE W SMEAR: Fungal Smear: NONE SEEN

## 2011-07-07 LAB — GLUCOSE, CAPILLARY
Glucose-Capillary: 139 — ABNORMAL HIGH
Glucose-Capillary: 142 — ABNORMAL HIGH
Glucose-Capillary: 157 — ABNORMAL HIGH
Glucose-Capillary: 160 — ABNORMAL HIGH
Glucose-Capillary: 163 — ABNORMAL HIGH
Glucose-Capillary: 186 — ABNORMAL HIGH
Glucose-Capillary: 190 — ABNORMAL HIGH
Glucose-Capillary: 238 — ABNORMAL HIGH
Glucose-Capillary: 259 — ABNORMAL HIGH

## 2011-07-07 LAB — CARDIAC PANEL(CRET KIN+CKTOT+MB+TROPI)
CK, MB: 1.1
Relative Index: INVALID
Relative Index: INVALID
Total CK: 63
Total CK: 70
Total CK: 73
Troponin I: 0.01
Troponin I: 0.03

## 2011-07-07 LAB — AFB CULTURE WITH SMEAR (NOT AT ARMC): Acid Fast Smear: NONE SEEN

## 2011-07-07 LAB — POCT CARDIAC MARKERS
CKMB, poc: 1.1
Troponin i, poc: <0.05

## 2011-07-07 LAB — CULTURE, RESPIRATORY W GRAM STAIN: Gram Stain: NONE SEEN

## 2011-07-07 LAB — HEMOGLOBIN A1C: Hgb A1c MFr Bld: 8.4 — ABNORMAL HIGH

## 2011-07-07 LAB — LIPID PANEL
Cholesterol: 186
HDL: 26 — ABNORMAL LOW
LDL Cholesterol: 118 — ABNORMAL HIGH
Triglycerides: 211 — ABNORMAL HIGH

## 2011-07-07 LAB — PROTIME-INR: INR: 1

## 2011-07-07 LAB — APTT: aPTT: 29

## 2011-07-07 LAB — B-NATRIURETIC PEPTIDE (CONVERTED LAB): Pro B Natriuretic peptide (BNP): <30

## 2011-07-07 LAB — CLOTEST (H. PYLORI), BIOPSY: Helicobacter screen: NEGATIVE

## 2011-07-07 LAB — TSH: TSH: 1.239

## 2011-07-07 MED ORDER — SODIUM CHLORIDE 0.9 % IJ SOLN
10.0000 mL | Freq: Once | INTRAMUSCULAR | Status: AC
  Administered 2011-07-07: 14:00:00 via INTRAVENOUS
  Filled 2011-07-07: qty 10

## 2011-07-07 MED ORDER — SODIUM CHLORIDE 0.9 % IJ SOLN
INTRAMUSCULAR | Status: AC
  Filled 2011-07-07: qty 10

## 2011-07-07 MED ORDER — HEPARIN SOD (PORK) LOCK FLUSH 100 UNIT/ML IV SOLN
INTRAVENOUS | Status: AC
  Filled 2011-07-07: qty 5

## 2011-07-07 MED ORDER — HEPARIN SOD (PORK) LOCK FLUSH 100 UNIT/ML IV SOLN
500.0000 [IU] | Freq: Once | INTRAVENOUS | Status: AC
  Administered 2011-07-07: 500 [IU] via INTRAVENOUS
  Filled 2011-07-07: qty 5

## 2011-07-07 NOTE — Progress Notes (Signed)
[name] presented for Portacath access and flush. Proper placement of portacath confirmed by CXR. Portacath located LT chest wall accessed with  H 20 needle. No blood return.  Portacath flushed with 20ml NS and 500U/38ml Heparin and needle removed intact. Procedure without incident. Patient tolerated procedure well.  Pt instructed to contact her primary oncologist and ask for an order for a dye study. Patient verbalized understanding.

## 2011-07-08 LAB — GLUCOSE, CAPILLARY: Glucose-Capillary: 198 — ABNORMAL HIGH

## 2011-07-17 ENCOUNTER — Encounter: Payer: Self-pay | Admitting: Cardiology

## 2011-07-17 ENCOUNTER — Ambulatory Visit (INDEPENDENT_AMBULATORY_CARE_PROVIDER_SITE_OTHER): Payer: Medicare Other | Admitting: Cardiology

## 2011-07-17 VITALS — BP 117/77 | HR 102 | Ht 61.0 in | Wt 206.0 lb

## 2011-07-17 DIAGNOSIS — I10 Essential (primary) hypertension: Secondary | ICD-10-CM

## 2011-07-17 DIAGNOSIS — E785 Hyperlipidemia, unspecified: Secondary | ICD-10-CM

## 2011-07-17 DIAGNOSIS — Z01810 Encounter for preprocedural cardiovascular examination: Secondary | ICD-10-CM

## 2011-07-17 NOTE — Assessment & Plan Note (Signed)
This will be evaluated as above. 

## 2011-07-17 NOTE — Patient Instructions (Signed)
Your physician has requested that you have a dobutamine echocardiogram. For further information please visit [email or web address] Please follow instruction sheet as given.  Continue current medications  Follow up in 1 year with Dr Antoine Poche.  You will receive a letter in the mail 2 months before you are due.  Please call us when you receive this letter to schedule your follow up appointment.

## 2011-07-17 NOTE — Assessment & Plan Note (Signed)
The blood pressure is at target. No change in medications is indicated. We will continue with therapeutic lifestyle changes (TLC).  

## 2011-07-17 NOTE — Progress Notes (Signed)
HPI The patient presents for yearly follow up. Since I saw her she has continued to have increasing dyspnea thought related to her lung surgery, tobacco abuse and chronic lung disease. She's had another chest pressure she had in 2005 when she had her stent. However, she is quite limited by her dyspnea. She is about to have right shoulder surgery  I understand he apparently needs surgical clearance for this as well. She fortunately continues to smoke cigarettes. She has had some pain around her Port-A-Cath site. However, she's had no chest pressure, neck or arm discomfort. She said no palpitations, presyncope or syncope. She is not describing PND or orthopnea.  She does wear O2 at night  Allergies  Allergen Reactions  . Niacin   . Varenicline Tartrate     Current Outpatient Prescriptions  Medication Sig Dispense Refill  . Cholecalciferol (VITAMIN D) 1000 UNITS capsule Take 1,000 Units by mouth daily.        [name] esomeprazole (NEXIUM) 40 MG capsule Take 40 mg by mouth daily as needed.        . fexofenadine (ALLEGRA) 180 MG tablet Take 180 mg by mouth daily.        . insulin glargine (LANTUS) 100 UNIT/ML injection Inject 40 Units into the skin at bedtime.        . insulin glulisine (APIDRA) 100 UNIT/ML injection Three times a day (just before each meal) 25-40-35 units       . ipratropium (ATROVENT) 0.02 % nebulizer solution Take 500 mcg by nebulization every 6 (six) hours as needed.        . Misc. Devices (FINGERTIP PULSE OXIMETER) MISC Use as directed to check oxygen saturation  1 each  0  . Multiple Vitamin (MULTIVITAMIN) capsule Take 1 capsule by mouth daily.          Past Medical History  Diagnosis Date  . Asthma   . Cholelithiasis   . Colon polyps   . Coronary artery disease     The pt had stenting of the midsegment of the right coronary artery in 2005 with the Cypher stent. The last catheterization demonstrated an EF of 60%. The right coronary artery stent was patent. The left  main was  normal. Circumflex had luminal irregularities. The LAD had luminal irregularities.   . Diabetes mellitus     Type II  . GERD (gastroesophageal reflux disease)   . Hyperlipidemia   . Hypertension   . Lung cancer 2010    Adenocarcinoma, right lung, node positive  . Tobacco abuse     Past Surgical History  Procedure Date  . Appendectomy 1970  . Gallbladder surgery 1989  . Abdominal hysterectomy 1972  . Tonsillectomy 1966  . Carpal tunnel release   . Replacement total knee   . Ptca   . Neck surgery 2005  . Back surgery 2007  . Pneumonectomy     Right Partial, lung cancer confirmed, nod positive    ROS:  As stated in the HPI and negative for all other systems.  PHYSICAL EXAM BP 117/77  Pulse 102  Ht 5\' 1"  (1.549 m)  Wt 206 lb (93.441 kg)  BMI 38.92 kg/m2 GENERAL:  Well appearing HEENT:  Pupils equal round and reactive, fundi not visualized, oral mucosa unremarkable NECK:  No jugular venous distention, waveform within normal limits, carotid upstroke brisk and symmetric, no bruits, no thyromegaly LYMPHATICS:  No cervical, inguinal adenopathy LUNGS:  Expiratory wheezing BACK:  No CVA tenderness CHEST:  Unremarkable, porta cath HEART:  PMI not displaced or sustained,S1 and S2 within normal limits, no S3, no S4, no clicks, no rubs, no murmurs ABD:  Flat, positive bowel sounds normal in frequency in pitch, no bruits, no rebound, no guarding, no midline pulsatile mass, no hepatomegaly, no splenomegaly EXT:  2 plus pulses throughout, no edema, no cyanosis no clubbing SKIN:  No rashes no nodules NEURO:  Cranial nerves II through XII grossly intact, motor grossly intact throughout PSYCH:  Cognitively intact, oriented to person place and time  EKG:  Sinus tachycardia, left axis deviation, poor anterior R-wave progression, no acute ST-T wave changes  ASSESSMENT AND PLAN

## 2011-07-17 NOTE — Assessment & Plan Note (Signed)
She cannot quit though we talked about this today.

## 2011-07-17 NOTE — Assessment & Plan Note (Signed)
I suspect that her lung disease is the biggest risk for any type of surgery.  However, she also has a history of heart disease and ongoing severe risk factors with a low functional status.  According to ACC/AHA guidelines she needs pre op stress testing.  I don't want to do a Lexiscan Myoview with her wheezing.  I will try to arrange a dobutamine echo.

## 2011-07-17 NOTE — Assessment & Plan Note (Signed)
She says she was taken off of statins because her LDL was going too low the last reading I see is 118. She will get this repeated and I will consider starting pravastatin in the future.

## 2011-07-18 ENCOUNTER — Ambulatory Visit (HOSPITAL_COMMUNITY): Payer: Medicare Other | Attending: Cardiology | Admitting: Radiology

## 2011-07-18 VITALS — Ht 61.0 in | Wt 206.0 lb

## 2011-07-18 DIAGNOSIS — F17200 Nicotine dependence, unspecified, uncomplicated: Secondary | ICD-10-CM | POA: Insufficient documentation

## 2011-07-18 DIAGNOSIS — Z85118 Personal history of other malignant neoplasm of bronchus and lung: Secondary | ICD-10-CM | POA: Insufficient documentation

## 2011-07-18 DIAGNOSIS — I10 Essential (primary) hypertension: Secondary | ICD-10-CM | POA: Insufficient documentation

## 2011-07-18 DIAGNOSIS — R0600 Dyspnea, unspecified: Secondary | ICD-10-CM

## 2011-07-18 DIAGNOSIS — Z01810 Encounter for preprocedural cardiovascular examination: Secondary | ICD-10-CM | POA: Insufficient documentation

## 2011-07-18 DIAGNOSIS — I2510 Atherosclerotic heart disease of native coronary artery without angina pectoris: Secondary | ICD-10-CM | POA: Insufficient documentation

## 2011-07-18 DIAGNOSIS — R0609 Other forms of dyspnea: Secondary | ICD-10-CM | POA: Insufficient documentation

## 2011-07-18 DIAGNOSIS — E119 Type 2 diabetes mellitus without complications: Secondary | ICD-10-CM | POA: Insufficient documentation

## 2011-07-18 DIAGNOSIS — C3490 Malignant neoplasm of unspecified part of unspecified bronchus or lung: Secondary | ICD-10-CM | POA: Insufficient documentation

## 2011-07-18 DIAGNOSIS — K219 Gastro-esophageal reflux disease without esophagitis: Secondary | ICD-10-CM | POA: Insufficient documentation

## 2011-07-18 DIAGNOSIS — R0989 Other specified symptoms and signs involving the circulatory and respiratory systems: Secondary | ICD-10-CM | POA: Insufficient documentation

## 2011-07-18 DIAGNOSIS — E785 Hyperlipidemia, unspecified: Secondary | ICD-10-CM | POA: Insufficient documentation

## 2011-07-18 MED ORDER — SODIUM CHLORIDE 0.9 % IV SOLN
20.0000 ug/kg | Freq: Once | INTRAVENOUS | Status: AC
  Administered 2011-07-18: 20 ug/kg/min via INTRAVENOUS

## 2011-07-21 ENCOUNTER — Telehealth: Payer: Self-pay | Admitting: Cardiology

## 2011-07-21 ENCOUNTER — Encounter (HOSPITAL_COMMUNITY)
Admission: RE | Admit: 2011-07-21 | Discharge: 2011-07-21 | Disposition: A | Discharge status: Home / Self Care | Payer: Medicare Other | Source: Ambulatory Visit | Attending: Orthopedic Surgery | Admitting: Orthopedic Surgery

## 2011-07-21 ENCOUNTER — Ambulatory Visit (HOSPITAL_COMMUNITY)
Admission: RE | Admit: 2011-07-21 | Discharge: 2011-07-21 | Disposition: A | Discharge status: Home / Self Care | Payer: Medicare Other | Source: Ambulatory Visit | Attending: Orthopedic Surgery | Admitting: Orthopedic Surgery

## 2011-07-21 ENCOUNTER — Other Ambulatory Visit (HOSPITAL_COMMUNITY): Payer: Self-pay | Admitting: Orthopedic Surgery

## 2011-07-21 DIAGNOSIS — J8410 Pulmonary fibrosis, unspecified: Secondary | ICD-10-CM | POA: Insufficient documentation

## 2011-07-21 DIAGNOSIS — Z01818 Encounter for other preprocedural examination: Secondary | ICD-10-CM | POA: Insufficient documentation

## 2011-07-21 DIAGNOSIS — M75101 Unspecified rotator cuff tear or rupture of right shoulder, not specified as traumatic: Secondary | ICD-10-CM

## 2011-07-21 DIAGNOSIS — Z85118 Personal history of other malignant neoplasm of bronchus and lung: Secondary | ICD-10-CM | POA: Insufficient documentation

## 2011-07-21 DIAGNOSIS — Z01812 Encounter for preprocedural laboratory examination: Secondary | ICD-10-CM | POA: Insufficient documentation

## 2011-07-21 LAB — BASIC METABOLIC PANEL
Chloride: 104 mEq/L (ref 96–112)
GFR calc Af Amer: >90 mL/min (ref >90)
Potassium: 5.2 mEq/L — ABNORMAL HIGH (ref 3.5–5.1)
Sodium: 138 mEq/L (ref 135–145)

## 2011-07-21 LAB — SURGICAL PCR SCREEN
MRSA, PCR: NEGATIVE
Staphylococcus aureus: NEGATIVE

## 2011-07-21 LAB — CBC
HCT: 46.7 % — ABNORMAL HIGH (ref 36.0–46.0)
Hemoglobin: 15.8 g/dL — ABNORMAL HIGH (ref 12.0–15.0)
RDW: 15.6 % — ABNORMAL HIGH (ref 11.5–15.5)
WBC: 9.4 10*3/uL (ref 4.0–10.5)

## 2011-07-21 NOTE — Telephone Encounter (Signed)
[name] calls from Dr. Ave Filter office re surgical clearance. Surgery is tomorrow. Pt was cleared by Dobutamine echo. Copy of ov and echo faxed today. Mylo Red RN

## 2011-07-21 NOTE — Telephone Encounter (Signed)
lmtcb Debbie Myleka Moncure RN  

## 2011-07-21 NOTE — Telephone Encounter (Signed)
Pt had office visit for surgical clearance and surgery is tomorrow and they need note today fax# [phone]

## 2011-07-22 ENCOUNTER — Ambulatory Visit (HOSPITAL_COMMUNITY)
Admission: RE | Admit: 2011-07-22 | Discharge: 2011-07-22 | Disposition: A | Discharge status: Home / Self Care | Payer: Medicare Other | Source: Ambulatory Visit | Attending: Orthopedic Surgery | Admitting: Orthopedic Surgery

## 2011-07-22 DIAGNOSIS — E119 Type 2 diabetes mellitus without complications: Secondary | ICD-10-CM | POA: Insufficient documentation

## 2011-07-22 DIAGNOSIS — M719 Bursopathy, unspecified: Secondary | ICD-10-CM | POA: Insufficient documentation

## 2011-07-22 DIAGNOSIS — Z01818 Encounter for other preprocedural examination: Secondary | ICD-10-CM | POA: Insufficient documentation

## 2011-07-22 DIAGNOSIS — F17200 Nicotine dependence, unspecified, uncomplicated: Secondary | ICD-10-CM | POA: Insufficient documentation

## 2011-07-22 DIAGNOSIS — M67919 Unspecified disorder of synovium and tendon, unspecified shoulder: Secondary | ICD-10-CM | POA: Insufficient documentation

## 2011-07-22 DIAGNOSIS — Z01812 Encounter for preprocedural laboratory examination: Secondary | ICD-10-CM | POA: Insufficient documentation

## 2011-07-22 DIAGNOSIS — I252 Old myocardial infarction: Secondary | ICD-10-CM | POA: Insufficient documentation

## 2011-07-22 DIAGNOSIS — M25819 Other specified joint disorders, unspecified shoulder: Secondary | ICD-10-CM | POA: Insufficient documentation

## 2011-07-22 DIAGNOSIS — M19019 Primary osteoarthritis, unspecified shoulder: Secondary | ICD-10-CM | POA: Insufficient documentation

## 2011-07-22 LAB — GLUCOSE, CAPILLARY
Glucose-Capillary: 220 mg/dL — ABNORMAL HIGH (ref 70–99)
Glucose-Capillary: 212 mg/dL — ABNORMAL HIGH (ref 70–99)
Glucose-Capillary: 186 mg/dL — ABNORMAL HIGH (ref 70–99)

## 2011-07-24 ENCOUNTER — Encounter: Payer: Self-pay | Admitting: *Deleted

## 2011-07-24 LAB — GLUCOSE, CAPILLARY: Glucose-Capillary: 234 mg/dL — ABNORMAL HIGH (ref 70–99)

## 2011-07-28 NOTE — Op Note (Signed)
NAME:  [name], [name] NO.:  [id number]  MEDICAL RECORD NO.:  [id number]  LOCATION:  [facility]SC                         FACILITY:  [facility]  PHYSICIAN:  Jones Broom, MD    DATE OF BIRTH:  02/28/1946  DATE OF PROCEDURE:  07/22/2011 DATE OF DISCHARGE:                              OPERATIVE REPORT   PREOPERATIVE DIAGNOSES: 1. Right shoulder rotator cuff tear. 2. Right shoulder acromioclavicular joint degenerative disease. 3. Right shoulder impingement. 4. Right shoulder proximal long head biceps tear.  POSTOPERATIVE DIAGNOSES: 1. Right shoulder rotator cuff tear. 2. Right shoulder acromioclavicular joint degenerative disease. 3. Right shoulder impingement. 4. Right shoulder proximal long head biceps tear.  PROCEDURES PERFORMED: 1. Right shoulder arthroscopic rotator cuff repair. 2. Right shoulder arthroscopic distal clavicle excision. 3. Right shoulder arthroscopic subacromial decompression. 4. Right shoulder arthroscopic tenotomy of the long head biceps with     extensive debridement.  ATTENDING SURGEON:  Berline Lopes, MD  ASSISTANT:  None.  ANESTHESIA:  GETA with preoperative interscalene block.  COMPLICATIONS:  None.  DRAINS:  None.  SPECIMENS:  None.  ESTIMATED BLOOD LOSS:  Minimal.  INDICATIONS FOR SURGERY:  The patient is a 65 year old female who injured her arm about 5 weeks ago.  She had a nonoperative management with injections, rest, and medication and went on to have continued pain.  She had an MRI which demonstrated a full-thickness small rotator cuff tear, as well as long head biceps tearing, AC joint disease, and a type 2-3 anterior acromion.  She wished to go forward with an operative treatment understanding risks benefits alternatives including but not limited to risk of bleeding, infection, damage to neurovascular structures, risk of nonhealing re-tear or potential stiffness.  She elected to go forward with  surgery.  OPERATIVE FINDINGS:  Examination under anesthesia demonstrated no stiffness or instability.  Diagnostic arthroscopy revealed extensive tearing of long head biceps and a tenotomy was carried out with extensive debridement of the proximal long head tendon at the supra glenoid tubercle.  The subscapularis was intact.  There was a small grade 1 changes on the humeral head and the superior aspect of the glenoid but otherwise the cartilage was intact.  There was extensive undersurface tearing of the rotator cuff with exposed tuberosity of about 8-10 mm with a small anterior full-thickness tear.  An extensive debridement was carried out on the undersurface.  From the bursal surface, she was noted to have extensive bursitis.  She was also again noted to have a small full- thickness anterior tear with a large anterior acromial spur.  A standard acromioplasty was performed, a distal clavicle excision was performed taking approximately 8 mm of distal clavicle in a smooth even fashion and then a rotator cuff repair was carried out with one 5.5 mm PEEK anchor with a simple suture repair.  PROCEDURE IN DETAIL:  The patient was identified in the preoperative holding area where I personally marked the operative site after verifying site, side, and procedure with the patient.  She was taken back to the operating room where general anesthesia was induced without complication.  She did have an interscalene block given by the attending anesthesiologist in the holding  area.  After general anesthesia was induced, she was placed in the beach-chair position with all extremities carefully padded in position.  The head and neck were carefully positioned as well.  The right upper extremity was prepped and draped in a standard sterile fashion.  She did receive IV antibiotics prior to the incision and an appropriate time-out procedure was carried out.  The arthroscope was introduced in standard  posterior portal position and diagnostic arthroscopy was carried out after an anterior portal was established just above the subscapularis with needle localization.  The findings of diagnostic arthroscopy are described above.  The shaver was used through the anterior portal to debride the extensive tearing of the proximal long head biceps and perform biceps tenotomy with the ArthroCare.  The remaining superior labrum was intact and debrided back to a stable base.  Subscapularis was intact.  The supraspinatus had extensive undersurface partial tearing with significant synovitis.  This was debrided back to a stable base and the exposed tuberosity was carefully examined.  Anteriorly just behind the bicipital groove, there was a small full-thickness tear of the supraspinatus.  I then placed the arthroscope in the subacromial space and established a lateral portal with needle localization.  Shaver [employer] were used through the lateral portal to debride extensive synovitis and bursitis.  She had extensive tearing of the coracoacromial ligament anteriorly indicating chronic impingement and a large anterior acromial spur.  The coracoacromial ligament was taken down with the ArthroCare and the acromioplasty was then performed with a 4-mm bur from lateral to medial in a smooth even fashion.  The distal clavicle was exposed arthroscopically and a distal clavicle excision was then carried out with a 4-mm bur, first in the lateral portal and subsequently from the anterior portal position taking off approximately 8 mm in a smooth even fashion.  The excision was viewed from anterior and lateral portals and felt to be complete.  The rotator cuff tear was then again examined and debrided back to a healthy-appearing tissue.  The tuberosity was prepared with a bur to bleeding tissue and one 5.5 mm corkscrew PEEK anchor was placed percutaneously just off the lateral inferior border of the acromion.   Two sutures were then passed sequentially through the healthy tendon edge and repaired in a simple fashion down to the prepared tuberosity.  The arthroscope was then removed from the joint and portals were closed with 3-0 nylon.  Sterile dressings were then applied including Xeroform, 4 x 4s, ABDs, and tape.  The patient was then allowed to awaken from general anesthesia, transferred to stretcher, and taken to the recovery room in a sling.  POSTOPERATIVE PLAN:  She will likely be discharged home with her family today and as long as she is stable in the recovery area.  If there is any concern with anesthesia, she will be kept overnight for observation.     Jones Broom, MD     JC/MEDQ  D:  07/22/2011  T:  07/22/2011  Job:  [id number]  Electronically Signed by Jones Broom  on 07/28/2011 08:13:37 AM

## 2011-08-18 ENCOUNTER — Encounter (HOSPITAL_COMMUNITY): Payer: Medicare Other | Attending: Oncology

## 2011-08-18 DIAGNOSIS — Z452 Encounter for adjustment and management of vascular access device: Secondary | ICD-10-CM

## 2011-08-18 DIAGNOSIS — C3410 Malignant neoplasm of upper lobe, unspecified bronchus or lung: Secondary | ICD-10-CM

## 2011-08-18 DIAGNOSIS — C3490 Malignant neoplasm of unspecified part of unspecified bronchus or lung: Secondary | ICD-10-CM | POA: Insufficient documentation

## 2011-08-18 MED ORDER — HEPARIN SOD (PORK) LOCK FLUSH 100 UNIT/ML IV SOLN
INTRAVENOUS | Status: AC
  Administered 2011-08-18: 500 [IU]
  Filled 2011-08-18: qty 5

## 2011-08-18 NOTE — Progress Notes (Signed)
[name] presented for Portacath access and flush. Proper placement of portacath confirmed by CXR. Portacath located LT chest wall accessed with  H 20 needle. No blood return present. Portacath flushed with 20ml NS and 500U/72ml Heparin and needle removed intact. Procedure without incident. Patient tolerated procedure well.  Patient did c/o burning while flushing port. Patient instructed to ask her oncologist to order a dye study for port to ensure that port is in proper placement. Port flushed without difficulty but would not give blood.

## 2011-08-29 ENCOUNTER — Other Ambulatory Visit: Payer: Self-pay | Admitting: *Deleted

## 2011-08-29 DIAGNOSIS — C3490 Malignant neoplasm of unspecified part of unspecified bronchus or lung: Secondary | ICD-10-CM

## 2011-08-29 NOTE — Progress Notes (Signed)
Pt called stating she was getting her port flushed at [facility] and her port has started burning and caused discomfort.  Per Dr Donnald Garre, okay to do dye study same say as next flush appt 12/27 at [facility].  Onc tx schedule filled out.  SLJ

## 2011-09-02 ENCOUNTER — Telehealth: Payer: Self-pay | Admitting: Internal Medicine

## 2011-09-02 NOTE — Telephone Encounter (Signed)
Talked to pt, gave her appt date for Dye study @ [facility], called [facility] and left message. Also asked pt to call and cancel flush appt for that same day.

## 2011-09-04 ENCOUNTER — Other Ambulatory Visit (HOSPITAL_COMMUNITY): Payer: Self-pay | Admitting: Family Medicine

## 2011-09-04 ENCOUNTER — Ambulatory Visit (HOSPITAL_COMMUNITY)
Admission: RE | Admit: 2011-09-04 | Discharge: 2011-09-04 | Disposition: A | Discharge status: Home / Self Care | Payer: Medicare Other | Source: Ambulatory Visit | Attending: Family Medicine | Admitting: Family Medicine

## 2011-09-04 DIAGNOSIS — R05 Cough: Secondary | ICD-10-CM

## 2011-09-04 DIAGNOSIS — J449 Chronic obstructive pulmonary disease, unspecified: Secondary | ICD-10-CM | POA: Insufficient documentation

## 2011-09-04 DIAGNOSIS — R509 Fever, unspecified: Secondary | ICD-10-CM | POA: Insufficient documentation

## 2011-09-04 DIAGNOSIS — C3490 Malignant neoplasm of unspecified part of unspecified bronchus or lung: Secondary | ICD-10-CM | POA: Insufficient documentation

## 2011-09-04 DIAGNOSIS — J4489 Other specified chronic obstructive pulmonary disease: Secondary | ICD-10-CM | POA: Insufficient documentation

## 2011-09-04 DIAGNOSIS — R059 Cough, unspecified: Secondary | ICD-10-CM | POA: Insufficient documentation

## 2011-09-19 ENCOUNTER — Other Ambulatory Visit: Payer: Self-pay | Admitting: Physician Assistant

## 2011-09-19 ENCOUNTER — Other Ambulatory Visit: Payer: Self-pay | Admitting: Internal Medicine

## 2011-09-19 ENCOUNTER — Other Ambulatory Visit (HOSPITAL_BASED_OUTPATIENT_CLINIC_OR_DEPARTMENT_OTHER): Payer: Medicare Other | Admitting: Lab

## 2011-09-19 ENCOUNTER — Ambulatory Visit (HOSPITAL_COMMUNITY)
Admission: RE | Admit: 2011-09-19 | Discharge: 2011-09-19 | Disposition: A | Discharge status: Home / Self Care | Payer: Medicare Other | Source: Ambulatory Visit | Attending: Internal Medicine | Admitting: Internal Medicine

## 2011-09-19 DIAGNOSIS — J701 Chronic and other pulmonary manifestations due to radiation: Secondary | ICD-10-CM | POA: Insufficient documentation

## 2011-09-19 DIAGNOSIS — R05 Cough: Secondary | ICD-10-CM | POA: Insufficient documentation

## 2011-09-19 DIAGNOSIS — C3490 Malignant neoplasm of unspecified part of unspecified bronchus or lung: Secondary | ICD-10-CM | POA: Insufficient documentation

## 2011-09-19 DIAGNOSIS — C3410 Malignant neoplasm of upper lobe, unspecified bronchus or lung: Secondary | ICD-10-CM

## 2011-09-19 DIAGNOSIS — Y842 Radiological procedure and radiotherapy as the cause of abnormal reaction of the patient, or of later complication, without mention of misadventure at the time of the procedure: Secondary | ICD-10-CM | POA: Insufficient documentation

## 2011-09-19 DIAGNOSIS — E875 Hyperkalemia: Secondary | ICD-10-CM

## 2011-09-19 DIAGNOSIS — R059 Cough, unspecified: Secondary | ICD-10-CM | POA: Insufficient documentation

## 2011-09-19 DIAGNOSIS — I2510 Atherosclerotic heart disease of native coronary artery without angina pectoris: Secondary | ICD-10-CM | POA: Insufficient documentation

## 2011-09-19 LAB — CBC WITH DIFFERENTIAL/PLATELET
Basophils Absolute: 0 10*3/uL (ref 0.0–0.1)
EOS%: 2 % (ref 0.0–7.0)
Eosinophils Absolute: 0.1 10*3/uL (ref 0.0–0.5)
HCT: 45.1 % (ref 34.8–46.6)
HGB: 15.1 g/dL (ref 11.6–15.9)
LYMPH%: 25.9 % (ref 14.0–49.7)
MCH: 27.6 pg (ref 25.1–34.0)
MCV: 82.5 fL (ref 79.5–101.0)
MONO%: 4.5 % (ref 0.0–14.0)
NEUT#: 4.2 10*3/uL (ref 1.5–6.5)
NEUT%: 66.9 % (ref 38.4–76.8)
Platelets: 144 10*3/uL — ABNORMAL LOW (ref 145–400)

## 2011-09-19 LAB — CMP (CANCER CENTER ONLY)
AST: 20 U/L (ref 11–38)
Albumin: 4.1 g/dL (ref 3.3–5.5)
Alkaline Phosphatase: 115 U/L — ABNORMAL HIGH (ref 26–84)
BUN, Bld: 9 mg/dL (ref 7–22)
Creat: 0.8 mg/dl (ref 0.6–1.2)
Glucose, Bld: 241 mg/dL — ABNORMAL HIGH (ref 73–118)

## 2011-09-19 MED ORDER — IOHEXOL 300 MG/ML  SOLN
80.0000 mL | Freq: Once | INTRAMUSCULAR | Status: AC | PRN
  Administered 2011-09-19: 80 mL via INTRAVENOUS

## 2011-09-19 NOTE — Progress Notes (Signed)
Pt notified to get labs on Monday 09/22/11 at 1pm

## 2011-09-22 ENCOUNTER — Other Ambulatory Visit (HOSPITAL_BASED_OUTPATIENT_CLINIC_OR_DEPARTMENT_OTHER): Payer: Medicare Other | Admitting: Lab

## 2011-09-22 DIAGNOSIS — E875 Hyperkalemia: Secondary | ICD-10-CM

## 2011-09-22 LAB — BASIC METABOLIC PANEL
BUN: 11 mg/dL (ref 6–23)
CO2: 24 mEq/L (ref 19–32)
Chloride: 106 mEq/L (ref 96–112)
Creatinine, Ser: 0.92 mg/dL (ref 0.50–1.10)
Potassium: 4.9 mEq/L (ref 3.5–5.3)

## 2011-09-24 ENCOUNTER — Ambulatory Visit (HOSPITAL_BASED_OUTPATIENT_CLINIC_OR_DEPARTMENT_OTHER): Payer: Medicare Other | Admitting: Internal Medicine

## 2011-09-24 ENCOUNTER — Telehealth: Payer: Self-pay | Admitting: Internal Medicine

## 2011-09-24 VITALS — BP 158/90 | HR 100 | Temp 98.0°F | Ht 61.0 in | Wt 207.0 lb

## 2011-09-24 DIAGNOSIS — F17200 Nicotine dependence, unspecified, uncomplicated: Secondary | ICD-10-CM

## 2011-09-24 DIAGNOSIS — C3490 Malignant neoplasm of unspecified part of unspecified bronchus or lung: Secondary | ICD-10-CM

## 2011-09-24 NOTE — Telephone Encounter (Signed)
gve the pt her June 2013 appt calendar along with the ct scan appt. °

## 2011-09-24 NOTE — Progress Notes (Signed)
[facility] OFFICE PROGRESS NOTE  [name], MD, MD [address] 4th [address] [place] [address]  DIAGNOSIS: Stage III (T2a N2 MX) non-small cell lung cancer, adenocarcinoma, diagnosed in October 2009.  PRIOR THERAPY: :   1. Status post right upper lobectomy with lymph node dissection under the care of Dr. Edwyna Shell on October 12, 2008. 2. Status post 4 cycles of adjuvant chemotherapy with cisplatin and docetaxel given every 3 weeks with Neulasta support.  Last dose was given on January 23, 2009. 3. Status post adjuvant radiotherapy to the mediastinum.  The patient received a total dose of 5040 cGy between March 06, 2009, through April 07, 2009, under the care of Dr. Michell Heinrich.  CURRENT THERAPY: Observation.  INTERVAL HISTORY: [name] 65 y.o. [name] returns to the clinic today for routine six-month followup visit. The patient is doing fine except for shortness of breath with exertion. She has no chest pain, no cough, no hemoptysis. No significant weight loss or night sweats. Unfortunately she continues to smoke. The patient has repeat CT scan of the chest performed recently and she is here today for evaluation and discussion of her scan results.  MEDICAL HISTORY: Past Medical History  Diagnosis Date  . Asthma   . Cholelithiasis   . Colon polyps   . Coronary artery disease     The pt had stenting of the midsegment of the right coronary artery in 2005 with the Cypher stent. The last catheterization demonstrated an EF of 60%. The right coronary artery stent was patent. The left  main was normal. Circumflex had luminal irregularities. The LAD had luminal irregularities.   . Diabetes mellitus     Type II  . GERD (gastroesophageal reflux disease)   . Hyperlipidemia   . Hypertension   . Lung cancer 2010    Adenocarcinoma, right lung, node positive  . Tobacco abuse     ALLERGIES:  is allergic to niacin and varenicline tartrate.  MEDICATIONS:  Current Outpatient  Prescriptions  Medication Sig Dispense Refill  . Cholecalciferol (VITAMIN D) 1000 UNITS capsule Take 1,000 Units by mouth daily.        [name] esomeprazole (NEXIUM) 40 MG capsule Take 40 mg by mouth daily as needed.        . fexofenadine (ALLEGRA) 180 MG tablet Take 180 mg by mouth daily.        . insulin glargine (LANTUS) 100 UNIT/ML injection Inject 40 Units into the skin at bedtime.        . insulin glulisine (APIDRA) 100 UNIT/ML injection Three times a day (just before each meal) 25-40-35 units       . ipratropium (ATROVENT) 0.02 % nebulizer solution Take 500 mcg by nebulization every 6 (six) hours as needed.        . Misc. Devices (FINGERTIP PULSE OXIMETER) MISC Use as directed to check oxygen saturation  1 each  0  . Multiple Vitamin (MULTIVITAMIN) capsule Take 1 capsule by mouth daily.          SURGICAL HISTORY:  Past Surgical History  Procedure Date  . Appendectomy 1970  . Gallbladder surgery 1989  . Abdominal hysterectomy 1972  . Tonsillectomy 1966  . Carpal tunnel release   . Replacement total knee   . Neck surgery 2005  . Back surgery 2007  . Pneumonectomy     Right Partial, lung cancer confirmed, nod positive    REVIEW OF SYSTEMS:  A comprehensive review of systems was negative except for:  Respiratory: positive for dyspnea on exertion   PHYSICAL EXAMINATION: General appearance: alert, cooperative and no distress Head: Normocephalic, without obvious abnormality, atraumatic Neck: no adenopathy Lymph nodes: Cervical, supraclavicular, and axillary nodes normal. Resp: clear to auscultation bilaterally Cardio: regular rate and rhythm, S1, S2 normal, no murmur, click, rub or gallop GI: soft, non-tender; bowel sounds normal; no masses,  no organomegaly Extremities: extremities normal, atraumatic, no cyanosis or edema Neurologic: Alert and oriented X 3, normal strength and tone. Normal symmetric reflexes. Normal coordination and gait  ECOG PERFORMANCE STATUS: 1 - Symptomatic but  completely ambulatory  Blood pressure 158/90, pulse 100, temperature 98 F (36.7 C), temperature source Oral, height 5\' 1"  (1.549 m), weight 207 lb (93.895 kg).  LABORATORY DATA: Lab Results  Component Value Date   WBC 6.4 09/19/2011   HGB 15.1 09/19/2011   HCT 45.1 09/19/2011   MCV 82.5 09/19/2011   PLT 144* 09/19/2011      Chemistry      Component Value Date/Time   NA 142 09/22/2011 1302   NA 144 09/19/2011 0938   K 4.9 09/22/2011 1302   K 5.1* 09/19/2011 0938   CL 106 09/22/2011 1302   CL 102 09/19/2011 0938   CO2 24 09/22/2011 1302   CO2 28 09/19/2011 0938   BUN 11 09/22/2011 1302   BUN 9 09/19/2011 0938   CREATININE 0.92 09/22/2011 1302   CREATININE 0.8 09/19/2011 0938      Component Value Date/Time   CALCIUM 9.2 09/22/2011 1302   CALCIUM 8.4 09/19/2011 0938   ALKPHOS 115* 09/19/2011 0938   ALKPHOS 99 09/18/2010 1320   AST 20 09/19/2011 0938   AST 15 09/18/2010 1320   ALT 13 09/18/2010 1320   BILITOT 0.70 09/19/2011 0938   BILITOT 0.5 09/18/2010 1320       RADIOGRAPHIC STUDIES: Dg Chest 2 View  09/04/2011  *RADIOLOGY REPORT*  Clinical Data: Cough and fever.  Lung cancer  CHEST - 2 VIEW  Comparison: 07/21/2011  Findings: Right upper lobectomy with right medial upper lobe density which is unchanged.  This may represent scarring from surgery and radiation.  Streaky markings in the lung bases are similar to prior studies and are compatible with scarring.  No superimposed pneumonia.  Negative for heart failure or effusion.  Port-A-Cath tip in the SVC, unchanged.  IMPRESSION: Right upper lobe density is stable compatible with postop scarring and radiation change.  Mild bibasilar atelectasis with COPD.  Negative for acute pneumonia.  Original Report Authenticated By: Camelia Phenes, M.D.   Ct Chest W Contrast  09/19/2011  *RADIOLOGY REPORT*  Clinical Data: Lung cancer.  Cough.   CT CHEST WITH CONTRAST  Technique:  Multidetector CT imaging of the chest was performed  following the standard protocol during bolus administration of intravenous contrast.  Contrast: 80mL OMNIPAQUE IOHEXOL 300 MG/ML IV SOLN  Comparison: 03/20/2011.  Findings: No pathologically enlarged mediastinal, hilar or axillary lymph nodes.  Esophagus is diffusely thickened.  Atherosclerotic calcification of the arterial vasculature, including coronary arteries.  Heart size normal.  No pericardial effusion.  Sub centimeter low attenuation lesions in the right lobe of the thyroid are unchanged and nonspecific.  Biapical pleural parenchymal scarring.  There is radiation fibrosis and volume loss in the medial aspect of the right hemithorax. Postoperative changes of right upper lobectomy.  Mild mosaic attenuation in the lungs, as before.  Previously seen focal ground- glass in the peripheral left upper lobe has resolved in the interval.  A few scattered tiny  pulmonary nodules are unchanged. No pleural fluid.  Airway is otherwise unremarkable.  Incidental imaging of the upper abdomen shows no acute findings. No worrisome lytic or sclerotic lesions.  IMPRESSION:  1.  Status post right upper lobectomy without evidence of recurrent or metastatic disease.  Radiation fibrosis in the medial right hemithorax. 2.  Interval clearing of focal left upper lobe ground-glass. 3.  Esophagus appears diffusely thickened.  Please correlate clinically for dysphagia.  Original Report Authenticated By: Reyes Ivan, M.D.    ASSESSMENT: This is a very pleasant 65 years old white [name] with history of stage IIIa non-small cell lung cancer status post right upper lobectomy followed by adjuvant chemotherapy as well as adjuvant radiation to the mediastinum. The patient is doing fine with no evidence for disease recurrence. I discussed the scan results with the patient and her husband.   PLAN: I recommended for the patient continuous observation for now with repeat CT scan of the chest in 6 months. I also strongly encouraged him to  quit smoking.  All questions were answered. The patient knows to call the clinic with any problems, questions or concerns. We can certainly see the patient much sooner if necessary.

## 2011-10-02 ENCOUNTER — Encounter (HOSPITAL_COMMUNITY): Payer: Medicare Other | Attending: Oncology

## 2011-10-02 ENCOUNTER — Ambulatory Visit (HOSPITAL_COMMUNITY)
Admission: RE | Admit: 2011-10-02 | Discharge: 2011-10-02 | Disposition: A | Discharge status: Home / Self Care | Payer: Medicare Other | Source: Ambulatory Visit | Attending: Internal Medicine | Admitting: Internal Medicine

## 2011-10-02 DIAGNOSIS — T82598A Other mechanical complication of other cardiac and vascular devices and implants, initial encounter: Secondary | ICD-10-CM | POA: Insufficient documentation

## 2011-10-02 DIAGNOSIS — Z452 Encounter for adjustment and management of vascular access device: Secondary | ICD-10-CM

## 2011-10-02 DIAGNOSIS — C3490 Malignant neoplasm of unspecified part of unspecified bronchus or lung: Secondary | ICD-10-CM | POA: Insufficient documentation

## 2011-10-02 DIAGNOSIS — C3410 Malignant neoplasm of upper lobe, unspecified bronchus or lung: Secondary | ICD-10-CM

## 2011-10-02 DIAGNOSIS — Y849 Medical procedure, unspecified as the cause of abnormal reaction of the patient, or of later complication, without mention of misadventure at the time of the procedure: Secondary | ICD-10-CM | POA: Insufficient documentation

## 2011-10-02 MED ORDER — SODIUM CHLORIDE 0.9 % IJ SOLN
10.0000 mL | INTRAMUSCULAR | Status: DC | PRN
  Administered 2011-10-02 (×2): 10 mL via INTRAVENOUS
  Filled 2011-10-02: qty 10

## 2011-10-02 MED ORDER — IOHEXOL 180 MG/ML  SOLN
100.0000 mL | Freq: Once | INTRAMUSCULAR | Status: AC | PRN
  Administered 2011-10-02: 100 mL via INTRAVENOUS

## 2011-10-02 MED ORDER — SODIUM CHLORIDE 0.9 % IJ SOLN
INTRAMUSCULAR | Status: AC
  Filled 2011-10-02: qty 10

## 2011-10-02 MED ORDER — HEPARIN SOD (PORK) LOCK FLUSH 100 UNIT/ML IV SOLN
500.0000 [IU] | Freq: Once | INTRAVENOUS | Status: AC
  Administered 2011-10-02: 500 [IU] via INTRAVENOUS
  Filled 2011-10-02: qty 5

## 2011-10-02 MED ORDER — HEPARIN SOD (PORK) LOCK FLUSH 100 UNIT/ML IV SOLN
INTRAVENOUS | Status: AC
  Filled 2011-10-02: qty 5

## 2011-10-02 NOTE — Procedures (Signed)
Left [place] port-a-cath was accessed in [facility].  Using sterile technique, the catheter was injected first with saline, then with 20 ml of Omnipaque 300.  No leak or extravasation is evident.  There is no significant fibrin sheath.  Blood was easily aspirated in the supine position.  The catheter was flushed with sterile saline and the patient was returned to the cancer center in stable condition.

## 2011-10-02 NOTE — Progress Notes (Signed)
[name] presented for Portacath access and then to radiology for dye study. Portacath located lt chest wall accessed with  H 20 needle. No blood return and pain with attempted instillation of saline. Capped and sent to x ray for dye study. Procedure without incident. Patient tolerated procedure well.

## 2011-10-02 NOTE — Progress Notes (Signed)
Patient returned for removal of needle from [facility].  Pain with initial instillation of saline, patient laid supine and then saline was instilled with no pain and followed with Heparin.  Needle removed intact and band-aid applied.  Patient tolerated well.

## 2011-11-12 ENCOUNTER — Encounter (HOSPITAL_COMMUNITY): Payer: Medicare Other | Attending: Oncology

## 2011-11-12 DIAGNOSIS — C3410 Malignant neoplasm of upper lobe, unspecified bronchus or lung: Secondary | ICD-10-CM

## 2011-11-12 DIAGNOSIS — Z452 Encounter for adjustment and management of vascular access device: Secondary | ICD-10-CM

## 2011-11-12 DIAGNOSIS — C3490 Malignant neoplasm of unspecified part of unspecified bronchus or lung: Secondary | ICD-10-CM | POA: Insufficient documentation

## 2011-11-12 MED ORDER — HEPARIN SOD (PORK) LOCK FLUSH 100 UNIT/ML IV SOLN
INTRAVENOUS | Status: AC
  Administered 2011-11-12: 500 [IU] via INTRAVENOUS
  Filled 2011-11-12: qty 5

## 2011-11-12 MED ORDER — SODIUM CHLORIDE 0.9 % IJ SOLN
10.0000 mL | Freq: Once | INTRAMUSCULAR | Status: AC
  Administered 2011-11-12: 10 mL via INTRAVENOUS
  Filled 2011-11-12: qty 10

## 2011-11-12 MED ORDER — SODIUM CHLORIDE 0.9 % IJ SOLN
INTRAMUSCULAR | Status: AC
  Administered 2011-11-12: 10 mL via INTRAVENOUS
  Filled 2011-11-12: qty 10

## 2011-11-12 MED ORDER — HEPARIN SOD (PORK) LOCK FLUSH 100 UNIT/ML IV SOLN
500.0000 [IU] | Freq: Once | INTRAVENOUS | Status: AC
  Administered 2011-11-12: 500 [IU] via INTRAVENOUS
  Filled 2011-11-12: qty 5

## 2011-11-12 NOTE — Progress Notes (Signed)
[name] presented for Portacath access and flush. Proper placement of portacath confirmed by CXR. Portacath located left chest wall accessed with  H 20 needle. Good blood return present. Portacath flushed with 20ml NS and 500U/45ml Heparin and needle removed intact. Procedure without incident. Patient tolerated procedure well.

## 2011-12-18 ENCOUNTER — Other Ambulatory Visit: Payer: Self-pay | Admitting: Gastroenterology

## 2011-12-24 ENCOUNTER — Encounter (HOSPITAL_COMMUNITY): Payer: Medicare Other | Attending: Oncology

## 2011-12-24 DIAGNOSIS — C3410 Malignant neoplasm of upper lobe, unspecified bronchus or lung: Secondary | ICD-10-CM

## 2011-12-24 DIAGNOSIS — Z452 Encounter for adjustment and management of vascular access device: Secondary | ICD-10-CM

## 2011-12-24 DIAGNOSIS — C3490 Malignant neoplasm of unspecified part of unspecified bronchus or lung: Secondary | ICD-10-CM | POA: Insufficient documentation

## 2011-12-24 MED ORDER — HEPARIN SOD (PORK) LOCK FLUSH 100 UNIT/ML IV SOLN
500.0000 [IU] | Freq: Once | INTRAVENOUS | Status: AC
  Administered 2011-12-24: 500 [IU] via INTRAVENOUS
  Filled 2011-12-24: qty 5

## 2011-12-24 MED ORDER — HEPARIN SOD (PORK) LOCK FLUSH 100 UNIT/ML IV SOLN
INTRAVENOUS | Status: AC
  Filled 2011-12-24: qty 5

## 2011-12-24 MED ORDER — SODIUM CHLORIDE 0.9 % IJ SOLN
INTRAMUSCULAR | Status: AC
  Filled 2011-12-24: qty 10

## 2011-12-24 MED ORDER — SODIUM CHLORIDE 0.9 % IJ SOLN
20.0000 mL | INTRAMUSCULAR | Status: DC | PRN
  Administered 2011-12-24: 20 mL via INTRAVENOUS
  Filled 2011-12-24: qty 20

## 2011-12-24 NOTE — Progress Notes (Signed)
[name] presented for Portacath access and flush. Proper placement of portacath confirmed by CXR. Portacath located left chest wall accessed with  H 20 needle. No blood return but flushed well without pain or irritation. Portacath flushed with 20ml NS and 500U/1ml Heparin and needle removed intact. Procedure without incident. Patient tolerated procedure well.

## 2012-02-04 ENCOUNTER — Encounter (HOSPITAL_COMMUNITY): Payer: Medicare Other | Attending: Oncology

## 2012-02-04 DIAGNOSIS — C3410 Malignant neoplasm of upper lobe, unspecified bronchus or lung: Secondary | ICD-10-CM

## 2012-02-04 DIAGNOSIS — C3490 Malignant neoplasm of unspecified part of unspecified bronchus or lung: Secondary | ICD-10-CM | POA: Insufficient documentation

## 2012-02-04 DIAGNOSIS — Z452 Encounter for adjustment and management of vascular access device: Secondary | ICD-10-CM

## 2012-02-04 MED ORDER — SODIUM CHLORIDE 0.9 % IJ SOLN
INTRAMUSCULAR | Status: AC
  Filled 2012-02-04: qty 10

## 2012-02-04 MED ORDER — HEPARIN SOD (PORK) LOCK FLUSH 100 UNIT/ML IV SOLN
INTRAVENOUS | Status: AC
  Filled 2012-02-04: qty 5

## 2012-02-04 MED ORDER — HEPARIN SOD (PORK) LOCK FLUSH 100 UNIT/ML IV SOLN
500.0000 [IU] | Freq: Once | INTRAVENOUS | Status: AC
  Administered 2012-02-04: 500 [IU] via INTRAVENOUS
  Filled 2012-02-04: qty 5

## 2012-02-04 MED ORDER — SODIUM CHLORIDE 0.9 % IJ SOLN
10.0000 mL | INTRAMUSCULAR | Status: DC | PRN
  Administered 2012-02-04: 10 mL via INTRAVENOUS
  Filled 2012-02-04: qty 10

## 2012-02-04 NOTE — Progress Notes (Signed)
[name] presented for Portacath access and flush. Proper placement of portacath confirmed by CXR. Portacath located left chest wall accessed with  H 20 needle. Good blood return present. Portacath flushed with 20ml NS and 500U/67ml Heparin and needle removed intact. Procedure without incident. Patient tolerated procedure well.

## 2012-03-17 ENCOUNTER — Encounter (HOSPITAL_COMMUNITY): Payer: Medicare Other | Attending: Oncology

## 2012-03-17 DIAGNOSIS — Z9889 Other specified postprocedural states: Secondary | ICD-10-CM | POA: Insufficient documentation

## 2012-03-17 DIAGNOSIS — C3410 Malignant neoplasm of upper lobe, unspecified bronchus or lung: Secondary | ICD-10-CM

## 2012-03-17 DIAGNOSIS — Z452 Encounter for adjustment and management of vascular access device: Secondary | ICD-10-CM

## 2012-03-17 DIAGNOSIS — Z95828 Presence of other vascular implants and grafts: Secondary | ICD-10-CM | POA: Insufficient documentation

## 2012-03-17 MED ORDER — HEPARIN SOD (PORK) LOCK FLUSH 100 UNIT/ML IV SOLN
INTRAVENOUS | Status: AC
  Filled 2012-03-17: qty 5

## 2012-03-17 MED ORDER — HEPARIN SOD (PORK) LOCK FLUSH 100 UNIT/ML IV SOLN
500.0000 [IU] | Freq: Once | INTRAVENOUS | Status: AC
  Administered 2012-03-17: 500 [IU] via INTRAVENOUS
  Filled 2012-03-17: qty 5

## 2012-03-17 NOTE — Progress Notes (Signed)
Damira J Tschetter presented for Portacath access and flush. Proper placement of portacath confirmed by CXR. Portacath located lt chest wall accessed with  H 20 needle. Good blood return present. Portacath flushed with 20ml NS and 500U/5ml Heparin and needle removed intact. Procedure without incident. Patient tolerated procedure well.   

## 2012-03-23 ENCOUNTER — Encounter (HOSPITAL_COMMUNITY): Payer: Self-pay

## 2012-03-23 ENCOUNTER — Ambulatory Visit (HOSPITAL_COMMUNITY)
Admission: RE | Admit: 2012-03-23 | Discharge: 2012-03-23 | Disposition: A | Discharge status: Home / Self Care | Payer: Medicare Other | Source: Ambulatory Visit | Attending: Internal Medicine | Admitting: Internal Medicine

## 2012-03-23 ENCOUNTER — Other Ambulatory Visit (HOSPITAL_BASED_OUTPATIENT_CLINIC_OR_DEPARTMENT_OTHER): Payer: Medicare Other | Admitting: Lab

## 2012-03-23 DIAGNOSIS — I059 Rheumatic mitral valve disease, unspecified: Secondary | ICD-10-CM | POA: Insufficient documentation

## 2012-03-23 DIAGNOSIS — Z902 Acquired absence of lung [part of]: Secondary | ICD-10-CM | POA: Insufficient documentation

## 2012-03-23 DIAGNOSIS — I700 Atherosclerosis of aorta: Secondary | ICD-10-CM | POA: Insufficient documentation

## 2012-03-23 DIAGNOSIS — C3490 Malignant neoplasm of unspecified part of unspecified bronchus or lung: Secondary | ICD-10-CM

## 2012-03-23 DIAGNOSIS — Z923 Personal history of irradiation: Secondary | ICD-10-CM | POA: Insufficient documentation

## 2012-03-23 DIAGNOSIS — Z9221 Personal history of antineoplastic chemotherapy: Secondary | ICD-10-CM | POA: Insufficient documentation

## 2012-03-23 DIAGNOSIS — I2510 Atherosclerotic heart disease of native coronary artery without angina pectoris: Secondary | ICD-10-CM | POA: Insufficient documentation

## 2012-03-23 DIAGNOSIS — R161 Splenomegaly, not elsewhere classified: Secondary | ICD-10-CM | POA: Insufficient documentation

## 2012-03-23 DIAGNOSIS — K449 Diaphragmatic hernia without obstruction or gangrene: Secondary | ICD-10-CM | POA: Insufficient documentation

## 2012-03-23 DIAGNOSIS — Z9089 Acquired absence of other organs: Secondary | ICD-10-CM | POA: Insufficient documentation

## 2012-03-23 LAB — CMP (CANCER CENTER ONLY)
ALT(SGPT): 20 U/L (ref 10–47)
AST: 15 U/L (ref 11–38)
Alkaline Phosphatase: 98 U/L — ABNORMAL HIGH (ref 26–84)
CO2: 30 mEq/L (ref 18–33)
Creat: 0.9 mg/dl (ref 0.6–1.2)
Sodium: 139 mEq/L (ref 128–145)
Total Bilirubin: 0.7 mg/dl (ref 0.20–1.60)
Total Protein: 7.1 g/dL (ref 6.4–8.1)

## 2012-03-23 LAB — CBC WITH DIFFERENTIAL/PLATELET
BASO%: 0.5 % (ref 0.0–2.0)
EOS%: 3 % (ref 0.0–7.0)
LYMPH%: 24.8 % (ref 14.0–49.7)
MCH: 26.8 pg (ref 25.1–34.0)
MCHC: 33.1 g/dL (ref 31.5–36.0)
MONO#: 0.4 10*3/uL (ref 0.1–0.9)
MONO%: 6.8 % (ref 0.0–14.0)
Platelets: 124 10*3/uL — ABNORMAL LOW (ref 145–400)
RBC: 5.72 10*6/uL — ABNORMAL HIGH (ref 3.70–5.45)
WBC: 5.3 10*3/uL (ref 3.9–10.3)

## 2012-03-23 MED ORDER — IOHEXOL 300 MG/ML  SOLN
80.0000 mL | Freq: Once | INTRAMUSCULAR | Status: AC | PRN
  Administered 2012-03-23: 80 mL via INTRAVENOUS

## 2012-03-24 ENCOUNTER — Telehealth: Payer: Self-pay | Admitting: Medical Oncology

## 2012-03-24 ENCOUNTER — Telehealth: Payer: Self-pay | Admitting: Internal Medicine

## 2012-03-24 NOTE — Telephone Encounter (Signed)
Per Dr Donnald Garre I told pt not to worry about scan and he will discuss details at f/u

## 2012-03-24 NOTE — Telephone Encounter (Signed)
s/w pt and r/s her 6/24 apt to 7/8 as mkm is out   aom

## 2012-03-24 NOTE — Telephone Encounter (Signed)
Wanting results of scan and not have to wait until July 8th. . I told her to keep appointment and I will notify Dr Donnald Garre of her request.

## 2012-03-29 ENCOUNTER — Ambulatory Visit: Payer: Medicare Other | Admitting: Internal Medicine

## 2012-04-09 ENCOUNTER — Telehealth: Payer: Self-pay | Admitting: Internal Medicine

## 2012-04-09 NOTE — Telephone Encounter (Signed)
pt called in to move 7/8 to 7/11

## 2012-04-12 ENCOUNTER — Ambulatory Visit: Payer: Medicare Other | Admitting: Internal Medicine

## 2012-04-15 ENCOUNTER — Telehealth: Payer: Self-pay | Admitting: Internal Medicine

## 2012-04-15 ENCOUNTER — Encounter: Payer: Self-pay | Admitting: *Deleted

## 2012-04-15 ENCOUNTER — Ambulatory Visit (HOSPITAL_BASED_OUTPATIENT_CLINIC_OR_DEPARTMENT_OTHER): Payer: Medicare Other | Admitting: Internal Medicine

## 2012-04-15 VITALS — BP 128/79 | HR 100 | Temp 97.0°F | Ht 61.0 in | Wt 203.3 lb

## 2012-04-15 DIAGNOSIS — R0602 Shortness of breath: Secondary | ICD-10-CM

## 2012-04-15 DIAGNOSIS — C3410 Malignant neoplasm of upper lobe, unspecified bronchus or lung: Secondary | ICD-10-CM

## 2012-04-15 DIAGNOSIS — C3490 Malignant neoplasm of unspecified part of unspecified bronchus or lung: Secondary | ICD-10-CM

## 2012-04-15 NOTE — Progress Notes (Signed)
[facility] [phone]   [phone]  OFFICE PROGRESS NOTE  DIAGNOSIS: Stage IIIA (T2a N2 MX) non-small cell lung cancer, adenocarcinoma, diagnosed in October 2009.   PRIOR THERAPY: :  1. Status post right upper lobectomy with lymph node dissection under the care of Dr. Edwyna Shell on October 12, 2008. 2. Status post 4 cycles of adjuvant chemotherapy with cisplatin and docetaxel given every 3 weeks with Neulasta support. Last dose was given on January 23, 2009. 3. Status post adjuvant radiotherapy to the mediastinum. The patient received a total dose of 5040 cGy between March 06, 2009, through April 07, 2009, under the care of Dr. Michell Heinrich.  CURRENT THERAPY: Observation.  INTERVAL HISTORY: [name] 66 y.o. female returns to the clinic today for six-month followup visit accompanied by her daughter. The patient is doing fine today except for shortness breath with exertion. Unfortunately she continues to smoke and again I strongly encouraged her to quit smoking. The patient has repeat CT scan of the chest performed recently and she is here today for evaluation and discussion of her scan results.  MEDICAL HISTORY: Past Medical History  Diagnosis Date  . Asthma   . Cholelithiasis   . Colon polyps   . Coronary artery disease     The pt had stenting of the midsegment of the right coronary artery in 2005 with the Cypher stent. The last catheterization demonstrated an EF of 60%. The right coronary artery stent was patent. The left  main was normal. Circumflex had luminal irregularities. The LAD had luminal irregularities.   . Diabetes mellitus     Type II  . GERD (gastroesophageal reflux disease)   . Hyperlipidemia   . Hypertension   . Lung cancer 2010    Adenocarcinoma, right lung, node positive  . Tobacco abuse     ALLERGIES:  is allergic to niacin and varenicline tartrate.  MEDICATIONS:  Current Outpatient Prescriptions  Medication Sig Dispense  Refill  . Cholecalciferol (VITAMIN D) 1000 UNITS capsule Take 1,000 Units by mouth daily.        . insulin glargine (LANTUS) 100 UNIT/ML injection Inject 40 Units into the skin at bedtime.        . insulin glulisine (APIDRA) 100 UNIT/ML injection Three times a day (just before each meal) 25-40-35 units       . NEXIUM 40 MG capsule take 1 capsule by mouth twice a day  60 capsule  5  . fexofenadine (ALLEGRA) 180 MG tablet Take 180 mg by mouth daily.        [name] ipratropium (ATROVENT) 0.02 % nebulizer solution Take 500 mcg by nebulization every 6 (six) hours as needed.        . Multiple Vitamin (MULTIVITAMIN) capsule Take 1 capsule by mouth daily.        [name] NOVOFINE 30G X 8 MM MISC 1 pen.      . RESTASIS 0.05 % ophthalmic emulsion Place 1 drop into both eyes Twice daily.      . simvastatin (ZOCOR) 20 MG tablet Take 20 mg by mouth.        SURGICAL HISTORY:  Past Surgical History  Procedure Date  . Appendectomy 1970  . Gallbladder surgery 1989  . Abdominal hysterectomy 1972  . Tonsillectomy 1966  . Carpal tunnel release   . Replacement total knee   . Neck surgery 2005  . Back surgery 2007  . Pneumonectomy     Right Partial, lung cancer confirmed,  nod positive    REVIEW OF SYSTEMS:  A comprehensive review of systems was negative except for: Respiratory: positive for dyspnea on exertion   PHYSICAL EXAMINATION: General appearance: alert, cooperative and no distress Neck: no adenopathy Lymph nodes: Cervical, supraclavicular, and axillary nodes normal. Resp: clear to auscultation bilaterally Cardio: regular rate and rhythm, S1, S2 normal, no murmur, click, rub or gallop GI: soft, non-tender; bowel sounds normal; no masses,  no organomegaly Extremities: extremities normal, atraumatic, no cyanosis or edema Neurologic: Alert and oriented X 3, normal strength and tone. Normal symmetric reflexes. Normal coordination and gait  ECOG PERFORMANCE STATUS: 1 - Symptomatic but completely  ambulatory  Blood pressure 128/79, pulse 100, temperature 97 F (36.1 C), temperature source Oral, height 5\' 1"  (1.549 m), weight 203 lb 4.8 oz (92.216 kg).  LABORATORY DATA: Lab Results  Component Value Date   WBC 5.3 03/23/2012   HGB 15.3 03/23/2012   HCT 46.3 03/23/2012   MCV 81.0 03/23/2012   PLT 124* 03/23/2012      Chemistry      Component Value Date/Time   NA 139 03/23/2012 0913   NA 142 09/22/2011 1302   K 5.1* 03/23/2012 0913   K 4.9 09/22/2011 1302   CL 100 03/23/2012 0913   CL 106 09/22/2011 1302   CO2 30 03/23/2012 0913   CO2 24 09/22/2011 1302   BUN 11 03/23/2012 0913   BUN 11 09/22/2011 1302   CREATININE 0.9 03/23/2012 0913   CREATININE 0.92 09/22/2011 1302      Component Value Date/Time   CALCIUM 8.8 03/23/2012 0913   CALCIUM 9.2 09/22/2011 1302   ALKPHOS 98* 03/23/2012 0913   ALKPHOS 99 09/18/2010 1320   AST 15 03/23/2012 0913   AST 15 09/18/2010 1320   ALT 13 09/18/2010 1320   BILITOT 0.70 03/23/2012 0913   BILITOT 0.5 09/18/2010 1320       RADIOGRAPHIC STUDIES: Ct Chest W Contrast  03/23/2012  *RADIOLOGY REPORT*  Clinical Data: History of lung cancer diagnosed in 2010 status post chemotherapy and radiation therapy.  Status post right upper lobectomy.  CT CHEST WITH CONTRAST  Technique:  Multidetector CT imaging of the chest was performed following the standard protocol during bolus administration of intravenous contrast.  Contrast: 80mL OMNIPAQUE IOHEXOL 300 MG/ML  SOLN  Comparison: CT of thorax 09/19/2011.  Findings:  Mediastinum: Heart size is normal. There is no significant pericardial fluid, thickening or pericardial calcification. There is atherosclerosis of the thoracic aorta, the great vessels of the mediastinum and the coronary arteries, including calcified atherosclerotic plaque in the left anterior descending, left circumflex and right coronary arteries. Extensive calcifications of the mitral annulus. There is a small hiatal hernia. Associated with this is  some extensive soft tissue thickening of the distal esophagus.  A left subclavian single lumen Port-A-Cath is present with tip terminating in the distal superior vena cava.  Notably, adjacent to the tip of the catheter there appears to be a small filling defect, concerning for catheter associated thrombus (best demonstrated on images 26 - 28 of series 2).  Lungs/Pleura: Status post right upper lobectomy.  Extensive chronic postradiation changes are noted in the paramedial aspect of the right middle and right lower lobes, similar to prior examination. Small calcified granuloma in the lingula.  No other suspicious appearing pulmonary nodules or masses are identified.  There are some areas of lucency interspersed with areas of subtle ground- glass attenuation with intervening geographic margins throughout the lung bases bilaterally, favored to reflect  mild air trapping related to small airways disease.  No acute consolidative airspace disease.  No pleural effusions.  Upper Abdomen: Status post cholecystectomy.  Splenomegaly is similar to the prior examination (AP dimension of the spleen is 14.7 cm).  Musculoskeletal: Orthopedic fixation hardware incompletely visualized in the lower cervical spine. There are no aggressive appearing lytic or blastic lesions noted in the visualized portions of the skeleton.  IMPRESSION: 1.  Status post right upper lobectomy with chronic postradiation changes in the paramediastinal aspect of the right lung, without evidence to suggest residual or recurrent disease on today's study. 2.  There is a left subclavian Port-A-Cath in position with tip terminating in the distal superior vena cava.  On today's examination there is an apparent filling defect associated with the tip of the catheter, which is concerning for catheter associated thrombus.  Clinical correlation is recommended. 3.  There is a small hiatal hernia that is associated with extensive circumferential thickening of the distal  esophagus. Overall, the appearance is similar to the prior examinations, and is therefore favored to simply represent reflux esophagitis (although this may also relate to prior radiation therapy). However, if there is any clinical concern for Barrett's metaplasia, further evaluation with endoscopy may be warranted. 4. Atherosclerosis, including three-vessel coronary artery disease. Please note that although the presence of coronary artery calcium documents the presence of coronary artery disease, the severity of this disease and any potential stenosis cannot be assessed on this non-gated CT examination.  Assessment for potential risk factor modification, dietary therapy or pharmacologic therapy may be warranted, if clinically indicated. 5. There are calcifications of the mitral annulus. Echocardiographic correlation for evaluation of potential valvular dysfunction may be warranted if clinically indicated. 6.  Evidence of mild air trapping in the lung bases bilaterally, as above, suggestive of small airways disease.  These results were called by telephone on 03/23/2012  at  12:55 p.m. to  Dr. [name], who verbally acknowledged these results.  Original Report Authenticated By: Florencia Reasons, M.D.    ASSESSMENT: This is a very pleasant 66 years old white female with history of stage IIIa non-small cell lung cancer status post right upper lobectomy followed by adjuvant chemotherapy as well as adjuvant radiotherapy. She is currently on observation with no evidence for disease recurrence.  PLAN: I discussed the scan results with the patient and her daughter and recommended for her continuous observation for now. I would see her back for followup visit in 6 months with repeat CT scan of the chest with contrast. I strongly encouraged her to quit smoking and the lung navigator provided her with smoke cessation material and consultation.  All questions were answered. The patient knows to call the clinic with any  problems, questions or concerns. We can certainly see the patient much sooner if necessary.

## 2012-04-15 NOTE — Telephone Encounter (Signed)
Gave pt appt calendar for January 2013 Lab, Ct and MD

## 2012-04-15 NOTE — Progress Notes (Signed)
Spoke with pt and daughter at [facility] today.  Information regarding smoking cessation.  Education on the physical effects of smoking.  She verbalized understanding of education.

## 2012-04-26 ENCOUNTER — Telehealth: Payer: Self-pay | Admitting: Medical Oncology

## 2012-04-26 NOTE — Telephone Encounter (Signed)
Pt notified that [facility] will remove port and she should be hearing form them soon ,. [name] will followup.

## 2012-04-27 ENCOUNTER — Telehealth: Payer: Self-pay | Admitting: *Deleted

## 2012-04-27 NOTE — Telephone Encounter (Signed)
Spoke with pt regarding appt.  She verbalized understanding of appt time and place. Dr. Dorris Fetch 05/04/12 at 12:45

## 2012-04-28 ENCOUNTER — Encounter (HOSPITAL_COMMUNITY): Payer: Medicare Other

## 2012-05-04 ENCOUNTER — Institutional Professional Consult (permissible substitution) (INDEPENDENT_AMBULATORY_CARE_PROVIDER_SITE_OTHER): Payer: Medicare Other | Admitting: Thoracic Surgery (Cardiothoracic Vascular Surgery)

## 2012-05-04 ENCOUNTER — Encounter: Payer: Self-pay | Admitting: Thoracic Surgery (Cardiothoracic Vascular Surgery)

## 2012-05-04 ENCOUNTER — Encounter (HOSPITAL_COMMUNITY): Payer: Self-pay | Admitting: Pharmacy Technician

## 2012-05-04 VITALS — BP 136/84 | HR 100 | Resp 20 | Ht 61.0 in | Wt 203.0 lb

## 2012-05-04 DIAGNOSIS — Z95828 Presence of other vascular implants and grafts: Secondary | ICD-10-CM

## 2012-05-04 DIAGNOSIS — C3490 Malignant neoplasm of unspecified part of unspecified bronchus or lung: Secondary | ICD-10-CM

## 2012-05-04 DIAGNOSIS — Z9889 Other specified postprocedural states: Secondary | ICD-10-CM

## 2012-05-04 DIAGNOSIS — Z902 Acquired absence of lung [part of]: Secondary | ICD-10-CM

## 2012-05-04 NOTE — Progress Notes (Signed)
PCP is No primary provider on file. Referring Provider is Mohamed, Mohamed, MD  Chief Complaint  Patient presents with  . Lung Cancer    Eval to remove Port-A-Cath      HPI: [name] is a 66-year-old woman who presents with a chief complaint of a clot from her Port-A-Cath.   [name] is a 66-year-old woman with a history of stage IIIa non-small cell carcinoma. Resected with a right upper lobectomy in 2010 been treated with chemotherapy. She had a Port-A-Cath placed in 2010. He said recently she is noted discomfort with attempts to flush the catheter as well as discomfort in the skin and chest wall around the catheter.  She recently had a CT of the chest (03/23/12) which showed no evidence of recurrent disease. He did show some clot in the superior vena cava associated with the tip of the Port-A-Cath. She is now referred for Port-A-Cath removal.   Past Medical History  Diagnosis Date  . Asthma   . Cholelithiasis   . Colon polyps   . Coronary artery disease     The pt had stenting of the midsegment of the right coronary artery in 2005 with the Cypher stent. The last catheterization demonstrated an EF of 60%. The right coronary artery stent was patent. The left  main was normal. Circumflex had luminal irregularities. The LAD had luminal irregularities.   . Diabetes mellitus     Type II  . GERD (gastroesophageal reflux disease)   . Hyperlipidemia   . Hypertension   . Lung cancer 2010    Adenocarcinoma, right lung, node positive  . Tobacco abuse     Past Surgical History  Procedure Date  . Appendectomy 1970  . Gallbladder surgery 1989  . Abdominal hysterectomy 1972  . Tonsillectomy 1966  . Carpal tunnel release   . Replacement total knee   . Neck surgery 2005  . Back surgery 2007  . Pneumonectomy     Right Partial, lung cancer confirmed, nod positive  . Right upper lobectomy with lymph node dissection 10/12/2008    Dr Burney    Family History  Problem Relation Age of  Onset  . Diabetes Mother   . Diabetes Father     Social History History  Substance Use Topics  . Smoking status: Current Everyday Smoker -- 1.0 packs/day for 40 years    Types: Cigarettes  . Smokeless tobacco: Not on file  . Alcohol Use: No    Current Outpatient Prescriptions  Medication Sig Dispense Refill  . Cholecalciferol (VITAMIN D) 1000 UNITS capsule Take 1,000 Units by mouth daily.        . fexofenadine (ALLEGRA) 180 MG tablet Take 180 mg by mouth daily.        . insulin glargine (LANTUS) 100 UNIT/ML injection Inject 50 Units into the skin at bedtime.       . insulin glulisine (APIDRA) 100 UNIT/ML injection 3 (three) times daily before meals. Three times a day (just before each meal) 20-20-25 units      . ipratropium (ATROVENT) 0.02 % nebulizer solution Take 500 mcg by nebulization every 6 (six) hours as needed.        . Multiple Vitamin (MULTIVITAMIN) capsule Take 1 capsule by mouth daily.        . NEXIUM 40 MG capsule take 1 capsule by mouth twice a day  60 capsule  5  . RESTASIS 0.05 % ophthalmic emulsion Place 1 drop into both eyes Twice daily.      .   simvastatin (ZOCOR) 20 MG tablet Take 20 mg by mouth.        Allergies  Allergen Reactions  . Niacin   . Varenicline Tartrate     Review of Systems  Constitutional: Negative for fever, chills, fatigue and unexpected weight change.  HENT: Negative for facial swelling.   Respiratory: Positive for shortness of breath (with exertion) and wheezing.   Cardiovascular: Negative for chest pain.    BP 136/84  Pulse 100  Resp 20  Ht 5' 1" (1.549 m)  Wt 203 lb (92.08 kg)  BMI 38.36 kg/m2  SpO2 95% Physical Exam  Vitals reviewed. Constitutional: She is oriented to person, place, and time. No distress.       obese  HENT:  Head: Normocephalic and atraumatic.  Eyes: EOM are normal. Pupils are equal, round, and reactive to light.  Neck: No thyromegaly present.  Cardiovascular: Normal rate, regular rhythm and normal heart  sounds.   No murmur heard. Pulmonary/Chest: Effort normal. She has wheezes (bilaterally).  Abdominal: Soft. There is no tenderness.  Musculoskeletal: She exhibits no edema.  Lymphadenopathy:    She has no cervical adenopathy.  Neurological: She is alert and oriented to person, place, and time.  Skin: Skin is warm and dry.     Diagnostic Tests: CT chest 03/23/12 IMPRESSION:  1. Status post right upper lobectomy with chronic postradiation  changes in the paramediastinal aspect of the right lung, without  evidence to suggest residual or recurrent disease on today's study.  2. There is a left subclavian Port-A-Cath in position with tip  terminating in the distal superior vena cava. On today's  examination there is an apparent filling defect associated with the  tip of the catheter, which is concerning for catheter associated  thrombus. Clinical correlation is recommended.  3. There is a small hiatal hernia that is associated with  extensive circumferential thickening of the distal esophagus.  Overall, the appearance is similar to the prior examinations, and  is therefore favored to simply represent reflux esophagitis  (although this may also relate to prior radiation therapy).  However, if there is any clinical concern for Barrett's metaplasia,  further evaluation with endoscopy may be warranted.  4. Atherosclerosis, including three-vessel coronary artery disease.  Please note that although the presence of coronary artery calcium  documents the presence of coronary artery disease, the severity of  this disease and any potential stenosis cannot be assessed on this  non-gated CT examination. Assessment for potential risk factor  modification, dietary therapy or pharmacologic therapy may be  warranted, if clinically indicated.  5. There are calcifications of the mitral annulus.  Echocardiographic correlation for evaluation of potential valvular  dysfunction may be warranted if  clinically indicated.  6. Evidence of mild air trapping in the lung bases bilaterally, as  above, suggestive of small airways disease.  Impression: 66-year-old woman with a history of stage IIIa non-small cell lung cancer. She has a Port-A-Cath in place and now has evidence of a probable thrombus in the superior vena cava associated with the Port-A-Cath. She is referred for Port-A-Cath removal.  I discussed with the patient and her daughter the indications, risks, benefits, and alternatives. They understand that the procedure will be done under local anesthesia in the operating room and that she should be able to go home the same day. They understand the risks include but are not limited to bleeding, embolization of the clot to the lungs, and wound infection. This appears to be a relatively small   thrombus and is along the wall of the superior vena cava, so in all likelihood it will remain fixed in place. However there is a small risk that this could embolize the lungs when the catheter is removed. Most likely would be a clinically silent event, but there is a small chance it could lead to potentially serious complications. She understands and accepts these risks and wishes to proceed.  Plan: Port-A-Cath removal Wednesday, August 5. 

## 2012-05-05 ENCOUNTER — Other Ambulatory Visit: Payer: Self-pay

## 2012-05-05 DIAGNOSIS — T82598A Other mechanical complication of other cardiac and vascular devices and implants, initial encounter: Secondary | ICD-10-CM

## 2012-05-07 NOTE — Pre-Procedure Instructions (Signed)
20 [name]  05/07/2012   Your procedure is scheduled on:  Wednesday May 12, 2012.  Report to [facility] at 0630 AM.  Call this number if you have problems the morning of surgery: [phone]   Remember:   Do not eat food or drink:After Midnight.    Take these medicines the morning of surgery with A SIP OF WATER: Atrovent nebulizer if needed, and Nexium   Take half usual dose of insulin the night before surgery.   Do not wear jewelry, make-up or nail polish.  Do not wear lotions, powders, or perfumes.   Do not shave 48 hours prior to surgery.   Do not bring valuables to the hospital.  Contacts, dentures or bridgework may not be worn into surgery.  Leave suitcase in the car. After surgery it may be brought to your room.  For patients admitted to the hospital, checkout time is 11:00 AM the day of discharge.   Patients discharged the day of surgery will not be allowed to drive home.  Name and phone number of your driver:   Special Instructions: CHG Shower Use Special Wash: 1/2 bottle night before surgery and 1/2 bottle morning of surgery.   Please read over the following fact sheets that you were given: Pain Booklet, Coughing and Deep Breathing, MRSA Information and Surgical Site Infection Prevention

## 2012-05-10 ENCOUNTER — Ambulatory Visit (HOSPITAL_COMMUNITY)
Admission: RE | Admit: 2012-05-10 | Discharge: 2012-05-10 | Disposition: A | Discharge status: Home / Self Care | Payer: Medicare Other | Source: Ambulatory Visit | Attending: Thoracic Surgery (Cardiothoracic Vascular Surgery) | Admitting: Thoracic Surgery (Cardiothoracic Vascular Surgery)

## 2012-05-10 ENCOUNTER — Encounter (HOSPITAL_COMMUNITY)
Admission: RE | Admit: 2012-05-10 | Discharge: 2012-05-10 | Disposition: A | Discharge status: Home / Self Care | Payer: Medicare Other | Source: Ambulatory Visit | Attending: Thoracic Surgery (Cardiothoracic Vascular Surgery) | Admitting: Thoracic Surgery (Cardiothoracic Vascular Surgery)

## 2012-05-10 ENCOUNTER — Encounter (HOSPITAL_COMMUNITY): Payer: Self-pay

## 2012-05-10 VITALS — BP 141/82 | HR 99 | Temp 97.7°F | Resp 20 | Ht 62.0 in | Wt 207.1 lb

## 2012-05-10 DIAGNOSIS — T82598A Other mechanical complication of other cardiac and vascular devices and implants, initial encounter: Secondary | ICD-10-CM

## 2012-05-10 DIAGNOSIS — Z923 Personal history of irradiation: Secondary | ICD-10-CM | POA: Insufficient documentation

## 2012-05-10 DIAGNOSIS — Z01818 Encounter for other preprocedural examination: Secondary | ICD-10-CM | POA: Insufficient documentation

## 2012-05-10 DIAGNOSIS — Z902 Acquired absence of lung [part of]: Secondary | ICD-10-CM | POA: Insufficient documentation

## 2012-05-10 DIAGNOSIS — Z01812 Encounter for preprocedural laboratory examination: Secondary | ICD-10-CM | POA: Insufficient documentation

## 2012-05-10 HISTORY — DX: Chronic obstructive pulmonary disease, unspecified: J44.9

## 2012-05-10 HISTORY — DX: Pneumonia, unspecified organism: J18.9

## 2012-05-10 LAB — COMPREHENSIVE METABOLIC PANEL
ALT: 9 U/L (ref 0–35)
AST: 13 U/L (ref 0–37)
CO2: 28 mEq/L (ref 19–32)
Calcium: 9.1 mg/dL (ref 8.4–10.5)
GFR calc non Af Amer: >90 mL/min (ref >90)
Potassium: 4.4 mEq/L (ref 3.5–5.1)
Sodium: 142 mEq/L (ref 135–145)
Total Protein: 6.9 g/dL (ref 6.0–8.3)

## 2012-05-10 LAB — CBC
MCH: 27.3 pg (ref 26.0–34.0)
Platelets: 137 10*3/uL — ABNORMAL LOW (ref 150–400)
RBC: 5.42 MIL/uL — ABNORMAL HIGH (ref 3.87–5.11)

## 2012-05-10 LAB — SURGICAL PCR SCREEN
MRSA, PCR: NEGATIVE
Staphylococcus aureus: NEGATIVE

## 2012-05-10 LAB — APTT: aPTT: 29 seconds (ref 24–37)

## 2012-05-11 MED ORDER — DEXTROSE 5 % IV SOLN
1.5000 g | INTRAVENOUS | Status: AC
  Administered 2012-05-12: 1.5 g via INTRAVENOUS
  Filled 2012-05-11: qty 1.5

## 2012-05-12 ENCOUNTER — Ambulatory Visit (HOSPITAL_COMMUNITY): Payer: Medicare Other | Admitting: Anesthesiology

## 2012-05-12 ENCOUNTER — Encounter (HOSPITAL_COMMUNITY)
Admission: RE | Disposition: A | Discharge status: Home / Self Care | Payer: Self-pay | Source: Ambulatory Visit | Attending: Thoracic Surgery (Cardiothoracic Vascular Surgery)

## 2012-05-12 ENCOUNTER — Ambulatory Visit (HOSPITAL_COMMUNITY)
Admission: RE | Admit: 2012-05-12 | Discharge: 2012-05-12 | Disposition: A | Discharge status: Home / Self Care | Payer: Medicare Other | Source: Ambulatory Visit | Attending: Thoracic Surgery (Cardiothoracic Vascular Surgery) | Admitting: Thoracic Surgery (Cardiothoracic Vascular Surgery)

## 2012-05-12 ENCOUNTER — Encounter (HOSPITAL_COMMUNITY): Payer: Self-pay | Admitting: Vascular Surgery

## 2012-05-12 ENCOUNTER — Encounter (HOSPITAL_COMMUNITY): Payer: Self-pay | Admitting: *Deleted

## 2012-05-12 DIAGNOSIS — T82598A Other mechanical complication of other cardiac and vascular devices and implants, initial encounter: Secondary | ICD-10-CM

## 2012-05-12 DIAGNOSIS — K219 Gastro-esophageal reflux disease without esophagitis: Secondary | ICD-10-CM | POA: Insufficient documentation

## 2012-05-12 DIAGNOSIS — I2510 Atherosclerotic heart disease of native coronary artery without angina pectoris: Secondary | ICD-10-CM | POA: Insufficient documentation

## 2012-05-12 DIAGNOSIS — I10 Essential (primary) hypertension: Secondary | ICD-10-CM | POA: Insufficient documentation

## 2012-05-12 DIAGNOSIS — C3490 Malignant neoplasm of unspecified part of unspecified bronchus or lung: Secondary | ICD-10-CM | POA: Insufficient documentation

## 2012-05-12 DIAGNOSIS — F17200 Nicotine dependence, unspecified, uncomplicated: Secondary | ICD-10-CM | POA: Insufficient documentation

## 2012-05-12 DIAGNOSIS — Y849 Medical procedure, unspecified as the cause of abnormal reaction of the patient, or of later complication, without mention of misadventure at the time of the procedure: Secondary | ICD-10-CM | POA: Insufficient documentation

## 2012-05-12 DIAGNOSIS — E119 Type 2 diabetes mellitus without complications: Secondary | ICD-10-CM | POA: Insufficient documentation

## 2012-05-12 DIAGNOSIS — T82898A Other specified complication of vascular prosthetic devices, implants and grafts, initial encounter: Secondary | ICD-10-CM | POA: Insufficient documentation

## 2012-05-12 DIAGNOSIS — E785 Hyperlipidemia, unspecified: Secondary | ICD-10-CM | POA: Insufficient documentation

## 2012-05-12 HISTORY — PX: PORT-A-CATH REMOVAL: SHX5289

## 2012-05-12 SURGERY — REMOVAL PORT-A-CATH
Anesthesia: Monitor Anesthesia Care | Site: Chest | Laterality: Left | Wound class: Clean

## 2012-05-12 MED ORDER — OXYCODONE HCL 5 MG/5ML PO SOLN: 5.0000 mg | Freq: Once | ORAL | Status: DC | PRN

## 2012-05-12 MED ORDER — PROMETHAZINE HCL 25 MG/ML IJ SOLN: 6.2500 mg | INTRAMUSCULAR | Status: DC | PRN

## 2012-05-12 MED ORDER — PROPOFOL 10 MG/ML IV BOLUS
INTRAVENOUS | Status: DC | PRN
  Administered 2012-05-12: 10 mg via INTRAVENOUS
  Administered 2012-05-12: 20 mg via INTRAVENOUS

## 2012-05-12 MED ORDER — LIDOCAINE-EPINEPHRINE (PF) 1 %-1:200000 IJ SOLN
INTRAMUSCULAR | Status: DC | PRN
  Administered 2012-05-12: 16 mL via INTRADERMAL

## 2012-05-12 MED ORDER — OXYCODONE HCL 5 MG PO TABS: 5.0000 mg | ORAL_TABLET | Freq: Once | ORAL | Status: DC | PRN

## 2012-05-12 MED ORDER — LACTATED RINGERS IV SOLN
INTRAVENOUS | Status: DC | PRN
  Administered 2012-05-12: 08:00:00 via INTRAVENOUS

## 2012-05-12 MED ORDER — 0.9 % SODIUM CHLORIDE (POUR BTL) OPTIME
TOPICAL | Status: DC | PRN
  Administered 2012-05-12: 1000 mL

## 2012-05-12 MED ORDER — MIDAZOLAM HCL 5 MG/5ML IJ SOLN
INTRAMUSCULAR | Status: DC | PRN
  Administered 2012-05-12 (×2): 1 mg via INTRAVENOUS

## 2012-05-12 MED ORDER — FENTANYL CITRATE 0.05 MG/ML IJ SOLN
INTRAMUSCULAR | Status: DC | PRN
  Administered 2012-05-12 (×2): 50 ug via INTRAVENOUS

## 2012-05-12 MED ORDER — HYDROMORPHONE HCL PF 1 MG/ML IJ SOLN: 0.2500 mg | INTRAMUSCULAR | Status: DC | PRN

## 2012-05-12 MED ORDER — LIDOCAINE-EPINEPHRINE (PF) 1 %-1:200000 IJ SOLN
INTRAMUSCULAR | Status: AC
  Filled 2012-05-12: qty 10

## 2012-05-12 SURGICAL SUPPLY — 31 items
ADH SKN CLS APL DERMABOND .7 (GAUZE/BANDAGES/DRESSINGS) ×1
BLADE SURG 11 STRL SS (BLADE) ×2 IMPLANT
CANISTER SUCTION 2500CC (MISCELLANEOUS) ×2 IMPLANT
CLOTH BEACON ORANGE TIMEOUT ST (SAFETY) ×2 IMPLANT
COVER SURGICAL LIGHT HANDLE (MISCELLANEOUS) ×4 IMPLANT
DECANTER SPIKE VIAL GLASS SM (MISCELLANEOUS) ×1 IMPLANT
DERMABOND ADVANCED (GAUZE/BANDAGES/DRESSINGS) ×1
DERMABOND ADVANCED .7 DNX12 (GAUZE/BANDAGES/DRESSINGS) ×1 IMPLANT
DRAPE CHEST BREAST 15X10 FENES (DRAPES) ×1 IMPLANT
DRAPE LAPAROTOMY T 102X78X121 (DRAPES) ×2 IMPLANT
ELECT REM PT RETURN 9FT ADLT (ELECTROSURGICAL) ×2
ELECTRODE REM PT RTRN 9FT ADLT (ELECTROSURGICAL) ×1 IMPLANT
GLOVE BIOGEL PI IND STRL 6 (GLOVE) IMPLANT
GLOVE BIOGEL PI INDICATOR 6 (GLOVE) ×1
GLOVE EUDERMIC 7 POWDERFREE (GLOVE) ×2 IMPLANT
GOWN PREVENTION PLUS XLARGE (GOWN DISPOSABLE) ×2 IMPLANT
GOWN STRL NON-REIN LRG LVL3 (GOWN DISPOSABLE) ×2 IMPLANT
KIT BASIN OR (CUSTOM PROCEDURE TRAY) ×2 IMPLANT
KIT ROOM TURNOVER OR (KITS) ×2 IMPLANT
NEEDLE 22X1 1/2 (OR ONLY) (NEEDLE) ×2 IMPLANT
NS IRRIG 1000ML POUR BTL (IV SOLUTION) ×2 IMPLANT
PACK GENERAL/GYN (CUSTOM PROCEDURE TRAY) ×2 IMPLANT
PAD ARMBOARD 7.5X6 YLW CONV (MISCELLANEOUS) ×4 IMPLANT
SPONGE GAUZE 4X4 12PLY (GAUZE/BANDAGES/DRESSINGS) ×2 IMPLANT
SUT VIC AB 3-0 SH 27 (SUTURE) ×2
SUT VIC AB 3-0 SH 27X BRD (SUTURE) ×1 IMPLANT
SUT VIC AB 3-0 X1 27 (SUTURE) ×1 IMPLANT
SYR CONTROL 10ML LL (SYRINGE) ×2 IMPLANT
TOWEL OR 17X24 6PK STRL BLUE (TOWEL DISPOSABLE) ×2 IMPLANT
TOWEL OR 17X26 10 PK STRL BLUE (TOWEL DISPOSABLE) ×2 IMPLANT
WATER STERILE IRR 1000ML POUR (IV SOLUTION) ×2 IMPLANT

## 2012-05-12 NOTE — Brief Op Note (Signed)
05/12/2012  9:07 AM  PATIENT:  [name]  67 y.o. female  PRE-OPERATIVE DIAGNOSIS:  PORTA-CATH THROMBOSIS  POST-OPERATIVE DIAGNOSIS:  PORTA-CATH THROMBOSIS  PROCEDURE:  Procedure(s) (LRB): REMOVAL PORT-A-CATH (Left)  SURGEON:  Surgeon(s) and Role:    * Loreli Slot, MD - Primary  ANESTHESIA:   local and IV sedation  EBL:   minimal  LOCAL MEDICATIONS USED:  LIDOCAINE  and Amount: 16 ml  SPECIMEN:  Portacath  DISPOSITION OF SPECIMEN:  N/A  COUNTS:  YES  PLAN OF CARE: Discharge to home after PACU  PATIENT DISPOSITION:  PACU - hemodynamically stable.   Delay start of Pharmacological VTE agent (>24hrs) due to surgical blood loss or risk of bleeding: not applicable

## 2012-05-12 NOTE — Anesthesia Procedure Notes (Signed)
Procedure Name: MAC Date/Time: 05/12/2012 8:00 AM Performed by: Sheppard Evens Pre-anesthesia Checklist: Patient identified, Emergency Drugs available, Suction available, Patient being monitored and Timeout performed Oxygen Delivery Method: Simple face mask

## 2012-05-12 NOTE — Anesthesia Preprocedure Evaluation (Signed)
Anesthesia Evaluation  Patient identified by MRN, date of birth, ID band Patient awake    Reviewed: Allergy & Precautions, H&P , NPO status , Patient's Chart, lab work & pertinent test results  Airway Mallampati: III TM Distance: >3 FB Neck ROM: Limited    Dental  (+) Edentulous Upper, Edentulous Lower and Dental Advisory Given   Pulmonary shortness of breath, with exertion and lying, asthma , neg pneumonia -, COPD COPD inhaler,    + decreased breath sounds      Cardiovascular Exercise Tolerance: Poor METS: < 3 Mets hypertension, + CAD Rhythm:Regular Rate:Normal + Systolic murmurs    Neuro/Psych negative neurological ROS     GI/Hepatic GERD-  Medicated and Controlled,  Endo/Other  Well Controlled, Type 2, Insulin Dependent  Renal/GU      Musculoskeletal negative musculoskeletal ROS (+)   Abdominal (+) + obese,   Bowel sounds: normal.  Peds  Hematology negative hematology ROS (+)   Anesthesia Other Findings   Reproductive/Obstetrics                           Anesthesia Physical Anesthesia Plan  ASA: III  Anesthesia Plan: MAC   Post-op Pain Management:    Induction: Intravenous  Airway Management Planned: Natural Airway and Nasal Cannula  Additional Equipment:   Intra-op Plan:   Post-operative Plan:   Informed Consent: I have reviewed the patients History and Physical, chart, labs and discussed the procedure including the risks, benefits and alternatives for the proposed anesthesia with the patient or authorized representative who has indicated his/her understanding and acceptance.   Dental advisory given  Plan Discussed with: CRNA, Anesthesiologist and Surgeon  Anesthesia Plan Comments: (Pt seen and examined. Risks, benefits, procedures explained. Pt accepts. Pt off ASA for ~2 wks. Discussed risk of in stent thrombosis. Pt accepts and understands necessity of ASA. )         Anesthesia Quick Evaluation

## 2012-05-12 NOTE — Anesthesia Postprocedure Evaluation (Signed)
  Anesthesia Post-op Note  Patient: [name]  Procedure(s) Performed: Procedure(s) (LRB): REMOVAL PORT-A-CATH (Left)  Patient Location: PACU  Anesthesia Type: MAC  Level of Consciousness: awake, alert  and oriented  Airway and Oxygen Therapy: Patient Spontanous Breathing  Post-op Pain: none  Post-op Assessment: Post-op Vital signs reviewed, Patient's Cardiovascular Status Stable, Respiratory Function Stable, Patent Airway and No signs of Nausea or vomiting  Post-op Vital Signs: Reviewed and stable  Complications: No apparent anesthesia complications

## 2012-05-12 NOTE — Anesthesia Postprocedure Evaluation (Signed)
Anesthesia Post Note  Patient: [name]  Procedure(s) Performed: Procedure(s) (LRB): REMOVAL PORT-A-CATH (Left)  Anesthesia type: MAC  Patient location: PACU  Post pain: Pain level controlled  Post assessment: Post-op Vital signs reviewed  Last Vitals: BP 126/74  Pulse 95  Temp 36.5 C (Oral)  Resp 18  SpO2 95%  Post vital signs: Reviewed  Level of consciousness: awake  Complications: No apparent anesthesia complications

## 2012-05-12 NOTE — H&P (View-Only) (Signed)
PCP is No primary provider on file. Referring Provider is Si Gaul, MD  Chief Complaint  Patient presents with  . Lung Cancer    Eval to remove Port-A-Cath      HPI: [name] is a 66 year old woman who presents with a chief complaint of a clot from her Port-A-Cath.   [name] is a 66 year old woman with a history of stage IIIa non-small cell carcinoma. Resected with a right upper lobectomy in 2010 been treated with chemotherapy. She had a Port-A-Cath placed in 2010. He said recently she is noted discomfort with attempts to flush the catheter as well as discomfort in the skin and chest wall around the catheter.  She recently had a CT of the chest (03/23/12) which showed no evidence of recurrent disease. He did show some clot in the superior vena cava associated with the tip of the Port-A-Cath. She is now referred for Port-A-Cath removal.   Past Medical History  Diagnosis Date  . Asthma   . Cholelithiasis   . Colon polyps   . Coronary artery disease     The pt had stenting of the midsegment of the right coronary artery in 2005 with the Cypher stent. The last catheterization demonstrated an EF of 60%. The right coronary artery stent was patent. The left  main was normal. Circumflex had luminal irregularities. The LAD had luminal irregularities.   . Diabetes mellitus     Type II  . GERD (gastroesophageal reflux disease)   . Hyperlipidemia   . Hypertension   . Lung cancer 2010    Adenocarcinoma, right lung, node positive  . Tobacco abuse     Past Surgical History  Procedure Date  . Appendectomy 1970  . Gallbladder surgery 1989  . Abdominal hysterectomy 1972  . Tonsillectomy 1966  . Carpal tunnel release   . Replacement total knee   . Neck surgery 2005  . Back surgery 2007  . Pneumonectomy     Right Partial, lung cancer confirmed, nod positive  . Right upper lobectomy with lymph node dissection 10/12/2008    Dr Edwyna Shell    Family History  Problem Relation Age of  Onset  . Diabetes Mother   . Diabetes Father     Social History History  Substance Use Topics  . Smoking status: Current Everyday Smoker -- 1.0 packs/day for 40 years    Types: Cigarettes  . Smokeless tobacco: Not on file  . Alcohol Use: No    Current Outpatient Prescriptions  Medication Sig Dispense Refill  . Cholecalciferol (VITAMIN D) 1000 UNITS capsule Take 1,000 Units by mouth daily.        . fexofenadine (ALLEGRA) 180 MG tablet Take 180 mg by mouth daily.        . insulin glargine (LANTUS) 100 UNIT/ML injection Inject 50 Units into the skin at bedtime.       . insulin glulisine (APIDRA) 100 UNIT/ML injection 3 (three) times daily before meals. Three times a day (just before each meal) 20-20-25 units      . ipratropium (ATROVENT) 0.02 % nebulizer solution Take 500 mcg by nebulization every 6 (six) hours as needed.        . Multiple Vitamin (MULTIVITAMIN) capsule Take 1 capsule by mouth daily.        [name] NEXIUM 40 MG capsule take 1 capsule by mouth twice a day  60 capsule  5  . RESTASIS 0.05 % ophthalmic emulsion Place 1 drop into both eyes Twice daily.      [name]  simvastatin (ZOCOR) 20 MG tablet Take 20 mg by mouth.        Allergies  Allergen Reactions  . Niacin   . Varenicline Tartrate     Review of Systems  Constitutional: Negative for fever, chills, fatigue and unexpected weight change.  HENT: Negative for facial swelling.   Respiratory: Positive for shortness of breath (with exertion) and wheezing.   Cardiovascular: Negative for chest pain.    BP 136/84  Pulse 100  Resp 20  Ht 5\' 1"  (1.549 m)  Wt 203 lb (92.08 kg)  BMI 38.36 kg/m2  SpO2 95% Physical Exam  Vitals reviewed. Constitutional: She is oriented to person, place, and time. No distress.       obese  HENT:  Head: Normocephalic and atraumatic.  Eyes: EOM are normal. Pupils are equal, round, and reactive to light.  Neck: No thyromegaly present.  Cardiovascular: Normal rate, regular rhythm and normal heart  sounds.   No murmur heard. Pulmonary/Chest: Effort normal. She has wheezes (bilaterally).  Abdominal: Soft. There is no tenderness.  Musculoskeletal: She exhibits no edema.  Lymphadenopathy:    She has no cervical adenopathy.  Neurological: She is alert and oriented to person, place, and time.  Skin: Skin is warm and dry.     Diagnostic Tests: CT chest 03/23/12 IMPRESSION:  1. Status post right upper lobectomy with chronic postradiation  changes in the paramediastinal aspect of the right lung, without  evidence to suggest residual or recurrent disease on today's study.  2. There is a left subclavian Port-A-Cath in position with tip  terminating in the distal superior vena cava. On today's  examination there is an apparent filling defect associated with the  tip of the catheter, which is concerning for catheter associated  thrombus. Clinical correlation is recommended.  3. There is a small hiatal hernia that is associated with  extensive circumferential thickening of the distal esophagus.  Overall, the appearance is similar to the prior examinations, and  is therefore favored to simply represent reflux esophagitis  (although this may also relate to prior radiation therapy).  However, if there is any clinical concern for Barrett's metaplasia,  further evaluation with endoscopy may be warranted.  4. Atherosclerosis, including three-vessel coronary artery disease.  Please note that although the presence of coronary artery calcium  documents the presence of coronary artery disease, the severity of  this disease and any potential stenosis cannot be assessed on this  non-gated CT examination. Assessment for potential risk factor  modification, dietary therapy or pharmacologic therapy may be  warranted, if clinically indicated.  5. There are calcifications of the mitral annulus.  Echocardiographic correlation for evaluation of potential valvular  dysfunction may be warranted if  clinically indicated.  6. Evidence of mild air trapping in the lung bases bilaterally, as  above, suggestive of small airways disease.  Impression: 66 year old woman with a history of stage IIIa non-small cell lung cancer. She has a Port-A-Cath in place and now has evidence of a probable thrombus in the superior vena cava associated with the Port-A-Cath. She is referred for Port-A-Cath removal.  I discussed with the patient and her daughter the indications, risks, benefits, and alternatives. They understand that the procedure will be done under local anesthesia in the operating room and that she should be able to go home the same day. They understand the risks include but are not limited to bleeding, embolization of the clot to the lungs, and wound infection. This appears to be a relatively small  thrombus and is along the wall of the superior vena cava, so in all likelihood it will remain fixed in place. However there is a small risk that this could embolize the lungs when the catheter is removed. Most likely would be a clinically silent event, but there is a small chance it could lead to potentially serious complications. She understands and accepts these risks and wishes to proceed.  Plan: Port-A-Cath removal Wednesday, August 5.

## 2012-05-12 NOTE — Interval H&P Note (Signed)
History and Physical Interval Note:  05/12/2012 8:06 AM  [name]  has presented today for surgery, with the diagnosis of PORTA-CATH THROMBOSIS  The various methods of treatment have been discussed with the patient and family. After consideration of risks, benefits and other options for treatment, the patient has consented to  Procedure(s) (LRB): REMOVAL PORT-A-CATH (Left) as a surgical intervention .  The patient's history has been reviewed, patient examined, no change in status, stable for surgery.  I have reviewed the patient's chart and labs.  Questions were answered to the patient's satisfaction.     Jaice Lague C

## 2012-05-12 NOTE — Transfer of Care (Signed)
Immediate Anesthesia Transfer of Care Note  Patient: [name]  Procedure(s) Performed: Procedure(s) (LRB): REMOVAL PORT-A-CATH (Left)  Patient Location: PACU  Anesthesia Type: MAC  Level of Consciousness: awake, alert  and oriented  Airway & Oxygen Therapy: Patient Spontanous Breathing  Post-op Assessment: Report given to PACU RN and Post -op Vital signs reviewed and stable  Post vital signs: Reviewed and stable  Complications: No apparent anesthesia complications

## 2012-05-13 NOTE — Op Note (Signed)
[name], [name]                ACCOUNT NO.:  [id number]  MEDICAL RECORD NO.:  [id number]  LOCATION:  [facility]                         FACILITY:  [facility]  PHYSICIAN:  Salvatore Decent. Dorris Fetch, M.D.DATE OF BIRTH:  October 09, 1945  DATE OF PROCEDURE:  05/12/2012 DATE OF DISCHARGE:  05/12/2012                              OPERATIVE REPORT   PREOPERATIVE DIAGNOSIS:  Port-A-Cath thrombosis.  POSTOP DIAGNOSIS:  Port-A-Cath thrombosis.  PROCEDURE:  Removal of Port-A-Cath.  SURGEON:  Salvatore Decent. Dorris Fetch, MD  ANESTHESIA:  Local with intravenous sedation.  ESTIMATED BLOOD LOSS:  Minimal.  FINDINGS:  Port-A-Cath removed intact without difficulty, pseudocapsule removed.  CLINICAL NOTE:  [name] is a 66 year old woman with a history of stage IIIA non-small cell carcinoma.  She has been treated with surgery and chemotherapy and had a Port-A-Cath in place since 2010.  She has been having difficulty with attempts to flush the catheter recently.  A CT of the chest showed thrombus in the superior vena cava associated with the tip of the Port-A-Cath and she was referred for Port-A-Cath removal. Indications, risks, benefits, and alternatives were discussed in detail with the patient.  She understood and accepted the risks and agreed to proceed.  OPERATIVE NOTE:  [name] was brought to the preop holding area on May 12, 2012, there, the Anesthesia Service established intravenous access.  She was taken to the operating room and given intravenous sedation monitored by the Anesthesia Service.  The chest was prepped and draped in usual sterile fashion, 1% lidocaine with epinephrine was used to anesthetize her previous incision and around the port itself, 16 mL of local was used.  After ensuring adequate local anesthesia, an incision was made through the patient's previous scar, it was carried through skin and subcutaneous tissue.  Hemostasis was achieved with electrocautery.  The pseudocapsule  overlying the port was incised.  The catheter was removed without resistance.  The pseudocapsule around the port itself then was incised and the port removed intact.  The pseudocapsule encapsulating the port then was excised, hemostasis was achieved.  The subcutaneous tissue was closed with interrupted 3-0 Vicryl sutures and the skin was closed with a 3-0 Vicryl subcuticular suture.  All sponge, needle, and instrument counts were correct at the end of the procedure.  The patient was taken from the operating room to the postanesthetic care unit in good condition.     Salvatore Decent Dorris Fetch, M.D.     SCH/MEDQ  D:  05/12/2012  T:  05/13/2012  Job:  [id number]

## 2012-05-14 ENCOUNTER — Encounter (HOSPITAL_COMMUNITY): Payer: Self-pay | Admitting: Thoracic Surgery (Cardiothoracic Vascular Surgery)

## 2012-05-21 ENCOUNTER — Ambulatory Visit: Payer: Self-pay

## 2012-05-21 ENCOUNTER — Other Ambulatory Visit: Payer: Self-pay | Admitting: Thoracic Surgery (Cardiothoracic Vascular Surgery)

## 2012-05-21 DIAGNOSIS — C3490 Malignant neoplasm of unspecified part of unspecified bronchus or lung: Secondary | ICD-10-CM

## 2012-05-24 ENCOUNTER — Other Ambulatory Visit: Payer: Self-pay | Admitting: Thoracic Surgery (Cardiothoracic Vascular Surgery)

## 2012-05-25 ENCOUNTER — Encounter: Payer: Self-pay | Admitting: Thoracic Surgery (Cardiothoracic Vascular Surgery)

## 2012-05-25 ENCOUNTER — Ambulatory Visit
Admission: RE | Admit: 2012-05-25 | Discharge: 2012-05-25 | Disposition: A | Discharge status: Home / Self Care | Payer: Medicare Other | Source: Ambulatory Visit | Attending: Thoracic Surgery (Cardiothoracic Vascular Surgery) | Admitting: Thoracic Surgery (Cardiothoracic Vascular Surgery)

## 2012-05-25 ENCOUNTER — Ambulatory Visit (INDEPENDENT_AMBULATORY_CARE_PROVIDER_SITE_OTHER): Payer: Medicare Other | Admitting: Thoracic Surgery (Cardiothoracic Vascular Surgery)

## 2012-05-25 VITALS — BP 148/85 | HR 100 | Resp 20 | Ht 61.0 in | Wt 204.0 lb

## 2012-05-25 DIAGNOSIS — Z09 Encounter for follow-up examination after completed treatment for conditions other than malignant neoplasm: Secondary | ICD-10-CM

## 2012-05-25 DIAGNOSIS — C3490 Malignant neoplasm of unspecified part of unspecified bronchus or lung: Secondary | ICD-10-CM

## 2012-05-25 NOTE — Progress Notes (Signed)
HPI:  [name] returns today after removal of a Port-A-Cath on 05/12/2012.  She complains of severe pain at the site. She describes as a burning sensation which is worsened by laying on that side. She's been taking hydrocodone 10/325 about 4 times a day. She says when she starts walking or moving or particularly if she moves her arm the pain is worse. On extensive questioning it does not sound anginal in nature.  Past Medical History  Diagnosis Date  . Asthma   . Cholelithiasis   . Colon polyps   . Coronary artery disease     The pt had stenting of the midsegment of the right coronary artery in 2005 with the Cypher stent. The last catheterization demonstrated an EF of 60%. The right coronary artery stent was patent. The left  main was normal. Circumflex had luminal irregularities. The LAD had luminal irregularities.   . Diabetes mellitus     Type II  . GERD (gastroesophageal reflux disease)   . Hyperlipidemia   . Hypertension   . Lung cancer 2010    Adenocarcinoma, right lung, node positive  . Tobacco abuse   . COPD (chronic obstructive pulmonary disease)   . Pneumonia   . Bronchitis   . Shortness of breath     with ambulation  . Arthritis   . Kidney stones     hx of  . Urgency of urination       Current Outpatient Prescriptions  Medication Sig Dispense Refill  . albuterol-ipratropium (COMBIVENT) 18-103 MCG/ACT inhaler Inhale 2 puffs into the lungs as needed.      . Cholecalciferol (VITAMIN D) 1000 UNITS capsule Take 1,000 Units by mouth daily.        . fexofenadine (ALLEGRA) 180 MG tablet Take 180 mg by mouth daily.        [name] HYDROcodone-acetaminophen (NORCO) 10-325 MG per tablet Take 1 tablet by mouth every 6 (six) hours as needed.      . insulin glargine (LANTUS) 100 UNIT/ML injection Inject 50 Units into the skin at bedtime.       . insulin glulisine (APIDRA) 100 UNIT/ML injection 3 (three) times daily before meals. Three times a day (just before each meal) 20-20-25  units      . ipratropium (ATROVENT) 0.02 % nebulizer solution Take 500 mcg by nebulization every 6 (six) hours as needed.        [name] NEXIUM 40 MG capsule take 1 capsule by mouth twice a day  60 capsule  5  . RESTASIS 0.05 % ophthalmic emulsion Place 1 drop into both eyes Twice daily.      . simvastatin (ZOCOR) 20 MG tablet Take 20 mg by mouth daily.         Physical Exam BP 148/85  Pulse 100  Resp 20  Ht 5\' 1"  (1.549 m)  Wt 204 lb (92.534 kg)  BMI 38.55 kg/m2  SpO60 16%  66 year old obese woman in no acute distress Wound clean dry and intact, no erythema or drainage, extremely tender to light touch.   Diagnostic Tests chest x-ray 05/25/2012 IMPRESSION:  Stable medial right upper lobe - right suprahilar opacity. Port-A-  Cath removed.  Impression: 2 weeks postop from removal of a Port-A-Cath. The procedure was uncomplicated and the wound is healing well without any sign of infection. She is having an inordinate amount of pain given the minor nature of the procedure. I initially was concerned that she might be having anginal-type symptoms, however this is very localized  to the incision and site where the port was removed and the surrounding area. I don't have any explanation for why she is having so much discomfort. She will continue to use the hydrocodone as she needs it. I would think this would improve rapidly over the next several weeks.  Plan: I will be happy to see [name] back if she continues to have significant pain.

## 2012-07-08 ENCOUNTER — Encounter: Payer: Self-pay | Admitting: Cardiology

## 2012-07-08 ENCOUNTER — Ambulatory Visit (INDEPENDENT_AMBULATORY_CARE_PROVIDER_SITE_OTHER): Payer: Medicare Other | Admitting: Cardiology

## 2012-07-08 VITALS — BP 152/80 | HR 94 | Ht 62.0 in | Wt 204.4 lb

## 2012-07-08 DIAGNOSIS — I2510 Atherosclerotic heart disease of native coronary artery without angina pectoris: Secondary | ICD-10-CM

## 2012-07-08 NOTE — Patient Instructions (Addendum)
The current medical regimen is effective;  continue present plan and medications.  Follow up in 1 year with Dr Hochrein.  You will receive a letter in the mail 2 months before you are due.  Please call us when you receive this letter to schedule your follow up appointment.  

## 2012-07-08 NOTE — Progress Notes (Signed)
.  HPI The patient presents for yearly follow up. Since I saw her her husband died from complications of his cancer. She's had no acute problems other than a clot on her Port-A-Cath and this had to be removed. She still gets some discomfort at that site and in her left breast periodically.  She has continued chronic dyspnea but this is unchanged from previous. She has not had PND or orthopnea. She does not have chest pressure, neck or arm discomfort. She's had no weight gain or edema. She did have trouble taking By[place].  She had significant hypoglycemia by her report and took herself off of this medicine. Her hemoglobin A1c was very elevated. However, she is following with Alice Reichert, MD and has an appointment with him today.  Allergies  Allergen Reactions  . Niacin   . Varenicline Tartrate     Current Outpatient Prescriptions  Medication Sig Dispense Refill  . albuterol-ipratropium (COMBIVENT) 18-103 MCG/ACT inhaler Inhale 2 puffs into the lungs as needed.      . Cholecalciferol (VITAMIN D) 1000 UNITS capsule Take 1,000 Units by mouth daily.        . fexofenadine (ALLEGRA) 180 MG tablet Take 180 mg by mouth daily.        [name] HYDROcodone-acetaminophen (NORCO) 10-325 MG per tablet Take 1 tablet by mouth every 6 (six) hours as needed.      . insulin glargine (LANTUS) 100 UNIT/ML injection Inject 50 Units into the skin at bedtime.       . insulin glulisine (APIDRA) 100 UNIT/ML injection 3 (three) times daily before meals. Three times a day (just before each meal) 20-20-25 units      . ipratropium (ATROVENT) 0.02 % nebulizer solution Take 500 mcg by nebulization every 6 (six) hours as needed.        [name] NEXIUM 40 MG capsule take 1 capsule by mouth twice a day  60 capsule  5  . RESTASIS 0.05 % ophthalmic emulsion Place 1 drop into both eyes Twice daily.      . simvastatin (ZOCOR) 20 MG tablet Take 20 mg by mouth daily.         Past Medical History  Diagnosis Date  . Asthma   . Cholelithiasis    . Colon polyps   . Coronary artery disease     The pt had stenting of the midsegment of the right coronary artery in 2005 with the Cypher stent. The last catheterization demonstrated an EF of 60%. The right coronary artery stent was patent. The left  main was normal. Circumflex had luminal irregularities. The LAD had luminal irregularities.   . Diabetes mellitus     Type II  . GERD (gastroesophageal reflux disease)   . Hyperlipidemia   . Hypertension   . Lung cancer 2010    Adenocarcinoma, right lung, node positive  . Tobacco abuse   . COPD (chronic obstructive pulmonary disease)   . Pneumonia   . Bronchitis   . Shortness of breath     with ambulation  . Arthritis   . Kidney stones     hx of  . Urgency of urination     Past Surgical History  Procedure Date  . Appendectomy 1970  . Gallbladder surgery 1989  . Abdominal hysterectomy 1972  . Tonsillectomy 1966  . Carpal tunnel release   . Replacement total knee 2001    left  . Neck surgery 2005  . Back surgery 2007  . Pneumonectomy     Right  Partial, lung cancer confirmed, nod positive  . Right upper lobectomy with lymph node dissection 10/12/2008    Dr Edwyna Shell  . Cardiac catheterization   . Coronary angioplasty 2005  . Portacath placement 12/12/2007  . Eye surgery     implants 02/18/12 (Left) 02/25/12 (right eye)  . Joint replacement   . Port-a-cath removal 05/12/2012    Procedure: REMOVAL PORT-A-CATH;  Surgeon: Loreli Slot, MD;  Location: [facility] OR;  Service: Thoracic;  Laterality: Left;    ROS:  As stated in the HPI and negative for all other systems.  PHYSICAL EXAM BP 152/80  Pulse 94  Ht 5\' 2"  (1.575 m)  Wt 92.715 kg (204 lb 6.4 oz)  BMI 37.39 kg/m2 GENERAL:  Well appearing HEENT:  Pupils equal round and reactive, fundi not visualized, oral mucosa unremarkable NECK:  No jugular venous distention, waveform within normal limits, carotid upstroke brisk and symmetric, no bruits, no thyromegaly LYMPHATICS:  No  cervical, inguinal adenopathy LUNGS:  Expiratory wheezing scattered BACK:  No CVA tenderness CHEST:  Unremarkable, porta cath removed on the right side HEART:  PMI not displaced or sustained,S1 and S2 within normal limits, no S3, no S4, no clicks, no rubs, no murmurs ABD:  Flat, positive bowel sounds normal in frequency in pitch, no bruits, no rebound, no guarding, no midline pulsatile mass, no hepatomegaly, no splenomegaly EXT:  2 plus pulses throughout, no edema, no cyanosis no clubbing SKIN:  No rashes no nodules NEURO:  Cranial nerves II through XII grossly intact, motor grossly intact throughout PSYCH:  Cognitively intact, oriented to person place and time  EKG:  Sinus tachycardia, left axis deviation, poor anterior R-wave progression, no acute ST-T wave changes. Rate 94 07/08/2012 ASSESSMENT AND PLAN  CORONARY HEART DISEASE -  She had a dobutamine echocardiogram last year that was negative for any evidence of ischemia. She's had no change in symptoms. No further testing is indicated.   TOBACCO ABUSE -  She is trying "vapor" cigarettes. This apparently includes nicotine but with a tapering dose. I encouraged her to get completely off of the nicotine.   HYPERTENSION -  The blood pressure is at target. No change in medications is indicated. We will continue with therapeutic lifestyle changes (TLC).  although it is elevated here she has a blood pressure cuff at home and says it is controlled.   HYPERLIPIDEMIA -  She says she was actually taken off statins in the past because her cholesterol is too low. Her LDL recently was 133. Given the conflicting history I will not change her medicine. However, she needs better blood sugar control and then followup of her lipids with adjustments if she's not an LDL below 100 in the future.   DIABETES - She reports her A1c was 14. She is working actively with Alice Reichert, MD to treat this.

## 2012-08-31 ENCOUNTER — Other Ambulatory Visit (HOSPITAL_COMMUNITY): Payer: Self-pay | Admitting: Family Medicine

## 2012-08-31 ENCOUNTER — Ambulatory Visit (HOSPITAL_COMMUNITY)
Admission: RE | Admit: 2012-08-31 | Discharge: 2012-08-31 | Disposition: A | Discharge status: Home / Self Care | Payer: Medicare Other | Source: Ambulatory Visit | Attending: Family Medicine | Admitting: Family Medicine

## 2012-08-31 DIAGNOSIS — R05 Cough: Secondary | ICD-10-CM

## 2012-08-31 DIAGNOSIS — R059 Cough, unspecified: Secondary | ICD-10-CM | POA: Insufficient documentation

## 2012-08-31 DIAGNOSIS — R062 Wheezing: Secondary | ICD-10-CM

## 2012-10-08 ENCOUNTER — Other Ambulatory Visit (HOSPITAL_BASED_OUTPATIENT_CLINIC_OR_DEPARTMENT_OTHER): Payer: Medicare Other | Admitting: Lab

## 2012-10-08 ENCOUNTER — Ambulatory Visit (HOSPITAL_COMMUNITY)
Admission: RE | Admit: 2012-10-08 | Discharge: 2012-10-08 | Disposition: A | Discharge status: Home / Self Care | Payer: Medicare Other | Source: Ambulatory Visit | Attending: Internal Medicine | Admitting: Internal Medicine

## 2012-10-08 DIAGNOSIS — I2510 Atherosclerotic heart disease of native coronary artery without angina pectoris: Secondary | ICD-10-CM | POA: Insufficient documentation

## 2012-10-08 DIAGNOSIS — Z923 Personal history of irradiation: Secondary | ICD-10-CM | POA: Insufficient documentation

## 2012-10-08 DIAGNOSIS — C3490 Malignant neoplasm of unspecified part of unspecified bronchus or lung: Secondary | ICD-10-CM | POA: Insufficient documentation

## 2012-10-08 DIAGNOSIS — K228 Other specified diseases of esophagus: Secondary | ICD-10-CM | POA: Insufficient documentation

## 2012-10-08 DIAGNOSIS — R0602 Shortness of breath: Secondary | ICD-10-CM | POA: Insufficient documentation

## 2012-10-08 DIAGNOSIS — Z9221 Personal history of antineoplastic chemotherapy: Secondary | ICD-10-CM | POA: Insufficient documentation

## 2012-10-08 DIAGNOSIS — K2289 Other specified disease of esophagus: Secondary | ICD-10-CM | POA: Insufficient documentation

## 2012-10-08 DIAGNOSIS — C3410 Malignant neoplasm of upper lobe, unspecified bronchus or lung: Secondary | ICD-10-CM

## 2012-10-08 LAB — COMPREHENSIVE METABOLIC PANEL (CC13)
Albumin: 4 g/dL (ref 3.5–5.0)
BUN: 15 mg/dL (ref 7.0–26.0)
Calcium: 10 mg/dL (ref 8.4–10.4)
Chloride: 106 mEq/L (ref 98–107)
Glucose: 250 mg/dl — ABNORMAL HIGH (ref 70–99)
Potassium: 5 mEq/L (ref 3.5–5.1)

## 2012-10-08 LAB — CBC WITH DIFFERENTIAL/PLATELET
Basophils Absolute: 0 10*3/uL (ref 0.0–0.1)
EOS%: 2.9 % (ref 0.0–7.0)
Eosinophils Absolute: 0.2 10*3/uL (ref 0.0–0.5)
HGB: 16.3 g/dL — ABNORMAL HIGH (ref 11.6–15.9)
MCH: 28.2 pg (ref 25.1–34.0)
NEUT#: 4.2 10*3/uL (ref 1.5–6.5)
RDW: 14.8 % — ABNORMAL HIGH (ref 11.2–14.5)
lymph#: 1.6 10*3/uL (ref 0.9–3.3)

## 2012-10-08 MED ORDER — IOHEXOL 300 MG/ML  SOLN
100.0000 mL | Freq: Once | INTRAMUSCULAR | Status: AC | PRN
  Administered 2012-10-08: 100 mL via INTRAVENOUS

## 2012-10-13 ENCOUNTER — Ambulatory Visit (HOSPITAL_BASED_OUTPATIENT_CLINIC_OR_DEPARTMENT_OTHER): Payer: Medicare Other | Admitting: Internal Medicine

## 2012-10-13 ENCOUNTER — Encounter: Payer: Self-pay | Admitting: Internal Medicine

## 2012-10-13 ENCOUNTER — Encounter: Payer: Self-pay | Admitting: *Deleted

## 2012-10-13 ENCOUNTER — Ambulatory Visit (HOSPITAL_COMMUNITY)
Admission: RE | Admit: 2012-10-13 | Discharge: 2012-10-13 | Disposition: A | Discharge status: Home / Self Care | Payer: Medicare Other | Source: Ambulatory Visit | Attending: Internal Medicine | Admitting: Internal Medicine

## 2012-10-13 ENCOUNTER — Telehealth: Payer: Self-pay | Admitting: Internal Medicine

## 2012-10-13 VITALS — BP 127/71 | HR 104 | Temp 96.5°F | Resp 20 | Ht 62.0 in | Wt 208.5 lb

## 2012-10-13 DIAGNOSIS — R51 Headache: Secondary | ICD-10-CM | POA: Insufficient documentation

## 2012-10-13 DIAGNOSIS — C3410 Malignant neoplasm of upper lobe, unspecified bronchus or lung: Secondary | ICD-10-CM

## 2012-10-13 DIAGNOSIS — C3490 Malignant neoplasm of unspecified part of unspecified bronchus or lung: Secondary | ICD-10-CM

## 2012-10-13 DIAGNOSIS — R42 Dizziness and giddiness: Secondary | ICD-10-CM | POA: Insufficient documentation

## 2012-10-13 MED ORDER — IOHEXOL 300 MG/ML  SOLN
100.0000 mL | Freq: Once | INTRAMUSCULAR | Status: AC | PRN
  Administered 2012-10-13: 100 mL via INTRAVENOUS

## 2012-10-13 NOTE — Progress Notes (Signed)
Spoke with Ms. Yates and daughter today regarding smoking cessation.  Information given and explained for smoking cessation.  Gave her my phone number to call if need further assistance

## 2012-10-13 NOTE — Progress Notes (Signed)
[facility] [phone]   [phone]  OFFICE PROGRESS NOTE  Rita Reichert, MD [address] [place] [address]  DIAGNOSIS: Stage IIIA (T2a N2 MX) non-small cell lung cancer, adenocarcinoma, diagnosed in October 2009.   PRIOR THERAPY: :  1. Status post right upper lobectomy with lymph node dissection under the care of Dr. Edwyna Shell on October 12, 2008. 2. Status post 4 cycles of adjuvant chemotherapy with cisplatin and docetaxel given every 3 weeks with Neulasta support. Last dose was given on January 23, 2009. 3. Status post adjuvant radiotherapy to the mediastinum. The patient received a total dose of 5040 cGy between March 06, 2009, through April 07, 2009, under the care of Dr. Michell Heinrich.  CURRENT THERAPY: Observation.  INTERVAL HISTORY: [name] 67 y.o. female returns to the clinic today for routine six-month followup visit accompanied her daughter. The patient is feeling fine today except for persistent headache recently. She takes ibuprofen with some improvement. The patient denied having any significant chest pain but continues to have shortness of breath with exertion as well as dry cough but no hemoptysis. She denied having any significant weight loss or night sweats. She has repeat CT scan of the chest performed recently and she is here for evaluation and discussion of her scan results. Unfortunately the patient continues to smoke.  MEDICAL HISTORY: Past Medical History  Diagnosis Date  . Asthma   . Cholelithiasis   . Colon polyps   . Coronary artery disease     The pt had stenting of the midsegment of the right coronary artery in 2005 with the Cypher stent. The last catheterization demonstrated an EF of 60%. The right coronary artery stent was patent. The left  main was normal. Circumflex had luminal irregularities. The LAD had luminal irregularities.   . Diabetes mellitus     Type II  . GERD (gastroesophageal reflux disease)   .  Hyperlipidemia   . Hypertension   . Lung cancer 2010    Adenocarcinoma, right lung, node positive  . Tobacco abuse   . COPD (chronic obstructive pulmonary disease)   . Pneumonia   . Bronchitis   . Shortness of breath     with ambulation  . Arthritis   . Kidney stones     hx of  . Urgency of urination     ALLERGIES:  is allergic to niacin and varenicline tartrate.  MEDICATIONS:  Current Outpatient Prescriptions  Medication Sig Dispense Refill  . albuterol-ipratropium (COMBIVENT) 18-103 MCG/ACT inhaler Inhale 2 puffs into the lungs as needed.      . Cholecalciferol (VITAMIN D) 1000 UNITS capsule Take 1,000 Units by mouth daily.        . fexofenadine (ALLEGRA) 180 MG tablet Take 180 mg by mouth daily.        [name] HYDROcodone-acetaminophen (NORCO) 10-325 MG per tablet Take 1 tablet by mouth every 6 (six) hours as needed.      . insulin glargine (LANTUS) 100 UNIT/ML injection Inject 50 Units into the skin at bedtime.       [name] ipratropium (ATROVENT) 0.02 % nebulizer solution Take 500 mcg by nebulization every 6 (six) hours as needed.        [name] NEXIUM 40 MG capsule take 1 capsule by mouth twice a day  60 capsule  5  . RESTASIS 0.05 % ophthalmic emulsion Place 1 drop into both eyes Twice daily.      . simvastatin (ZOCOR) 20 MG  tablet Take 20 mg by mouth daily.       . insulin glulisine (APIDRA) 100 UNIT/ML injection 3 (three) times daily before meals. Three times a day (just before each meal) 20-20-25 units        SURGICAL HISTORY:  Past Surgical History  Procedure Date  . Appendectomy 1970  . Gallbladder surgery 1989  . Abdominal hysterectomy 1972  . Tonsillectomy 1966  . Carpal tunnel release   . Replacement total knee 2001    left  . Neck surgery 2005  . Back surgery 2007  . Pneumonectomy     Right Partial, lung cancer confirmed, nod positive  . Right upper lobectomy with lymph node dissection 10/12/2008    Dr Edwyna Shell  . Cardiac catheterization   . Coronary angioplasty 2005  .  Portacath placement 12/12/2007  . Eye surgery     implants 02/18/12 (Left) 02/25/12 (right eye)  . Joint replacement   . Port-a-cath removal 05/12/2012    Procedure: REMOVAL PORT-A-CATH;  Surgeon: Loreli Slot, MD;  Location: [facility] OR;  Service: Thoracic;  Laterality: Left;    REVIEW OF SYSTEMS:  A comprehensive review of systems was negative except for: Constitutional: positive for fatigue Respiratory: positive for cough and dyspnea on exertion Neurological: positive for headaches   PHYSICAL EXAMINATION: General appearance: alert, cooperative and no distress Head: Normocephalic, without obvious abnormality, atraumatic Neck: no adenopathy Lymph nodes: Cervical, supraclavicular, and axillary nodes normal. Resp: clear to auscultation bilaterally Cardio: regular rate and rhythm, S1, S2 normal, no murmur, click, rub or gallop GI: soft, non-tender; bowel sounds normal; no masses,  no organomegaly Extremities: extremities normal, atraumatic, no cyanosis or edema  ECOG PERFORMANCE STATUS: 1 - Symptomatic but completely ambulatory  Blood pressure 127/71, pulse 104, temperature 96.5 F (35.8 C), temperature source Oral, resp. rate 20, height 5\' 2"  (1.575 m), weight 208 lb 8 oz (94.575 kg).  LABORATORY DATA: Lab Results  Component Value Date   WBC 6.5 10/08/2012   HGB 16.3* 10/08/2012   HCT 47.6* 10/08/2012   MCV 82.2 10/08/2012   PLT 130* 10/08/2012      Chemistry      Component Value Date/Time   NA 142 10/08/2012 0958   NA 142 05/10/2012 1242   NA 139 03/23/2012 0913   K 5.0 10/08/2012 0958   K 4.4 05/10/2012 1242   K 5.1* 03/23/2012 0913   CL 106 10/08/2012 0958   CL 106 05/10/2012 1242   CL 100 03/23/2012 0913   CO2 25 10/08/2012 0958   CO2 28 05/10/2012 1242   CO2 30 03/23/2012 0913   BUN 15.0 10/08/2012 0958   BUN 12 05/10/2012 1242   BUN 11 03/23/2012 0913   CREATININE 1.0 10/08/2012 0958   CREATININE 0.66 05/10/2012 1242   CREATININE 0.9 03/23/2012 0913      Component Value Date/Time   CALCIUM 10.0  10/08/2012 0958   CALCIUM 9.1 05/10/2012 1242   CALCIUM 8.8 03/23/2012 0913   ALKPHOS 112 10/08/2012 0958   ALKPHOS 106 05/10/2012 1242   ALKPHOS 98* 03/23/2012 0913   AST 13 10/08/2012 0958   AST 13 05/10/2012 1242   AST 15 03/23/2012 0913   ALT 11 10/08/2012 0958   ALT 9 05/10/2012 1242   BILITOT 0.61 10/08/2012 0958   BILITOT 0.3 05/10/2012 1242   BILITOT 0.70 03/23/2012 0913       RADIOGRAPHIC STUDIES: Ct Chest W Contrast  10/08/2012  *RADIOLOGY REPORT*  Clinical Data: History of lung cancer status post chemotherapy  and radiation therapy, as well as right upper lobectomy.  Shortness of breath.  CT CHEST WITH CONTRAST  Technique:  Multidetector CT imaging of the chest was performed following the standard protocol during bolus administration of intravenous contrast.  Contrast: OMNIPAQUE IOHEXOL 300 MG/ML  SOLN  Comparison: Multiple priors, most recently chest CT 03/23/2012.  Findings:  Mediastinum: Heart size is normal. There is no significant pericardial fluid, thickening or pericardial calcification. There is atherosclerosis of the thoracic aorta, the great vessels of the mediastinum and the coronary arteries, including calcified atherosclerotic plaque in the left main, left anterior descending, left circumflex and right coronary arteries. Dense calcifications of the mitral annulus.  Mild calcifications of the aortic valve. Marked circumferential thickening of the distal third of the esophagus is again noted. No pathologically enlarged mediastinal or hilar lymph nodes.  Lungs/Pleura: Postoperative changes of right upper lobectomy and right-sided radiation therapy are redemonstrated with upward retraction of the right hilum and chronic postradiation changes including cylindrical and varicose bronchiectasis in the superior medial aspect of the right hemithorax.  No definite soft tissue mass is noted in this region to suggest local recurrence of disease.  There are no suspicious appearing pulmonary nodules or  masses identified.  There are a few scattered tiny calcified pulmonary nodules, compatible with calcified granulomas.  The lung parenchyma has a mosaic appearance with areas of ground-glass attenuation interspersed with areas of lucency, with intervening geographic margins, suggestive of air trapping related to small airways disease.  No acute consolidative air space disease.  No pleural effusions.  Upper Abdomen: Status post cholecystectomy.  Musculoskeletal: There is a healing fracture of the anterior aspect of the right sixth rib which is new compared to the prior study. No other definite rib fractures are identified. There are no aggressive appearing lytic or blastic lesions noted in the visualized portions of the skeleton.  IMPRESSION: 1.  Status post right upper lobectomy and radiation therapy to the right hemithorax, with stable postoperative and post treatment related changes, as above.  No findings to suggest local recurrence of disease or new metastatic disease to the thorax. 2. New healing fracture of the anterior aspect of the right sixth rib. 3.  The appearance of the lung parenchyma suggests air trapping from small airways disease, as discussed above. 4. Atherosclerosis, including left main and three-vessel coronary artery disease.   Assessment for potential risk factor modification, dietary therapy or pharmacologic therapy may be warranted, if clinically indicated. 5.  Persistent circumferential thickening of the distal third of the esophagus.  This may simply reflect reflux esophagitis, but if there is any clinical concern for esophageal metaplasia or neoplasia, further evaluation with endoscopy may be warranted. 6.  Additional incidental findings, similar to prior examinations, as discussed above.   Original Report Authenticated By: Trudie Reed, M.D.     ASSESSMENT: This is a very pleasant 67 years old white female with history of stage IIIa non-small cell lung cancer status post right upper  lobectomy followed by 4 cycles of adjuvant chemotherapy as well as adjuvant radiotherapy and she has been observation since July of 2010 with no evidence for disease recurrence.  PLAN: I discussed the scan results with the patient and her daughter today. I recommended for her to continue on observation with repeat CT scan of the chest in 6 months. For the headache I will order CT of the head with and without contrast today to rule out any brain metastasis. For smoke cessation, I encouraged the patient to  quit smoking and she was seen by the thoracic navigator today and was given material about smoke cessation and free nicotine patches to help her to quit. The patient was advised to call immediately she has any concerning symptoms in the interval  All questions were answered. The patient knows to call the clinic with any problems, questions or concerns. We can certainly see the patient much sooner if necessary.

## 2012-10-13 NOTE — Patient Instructions (Signed)
Your scan showed no evidence for disease recurrence. You complained of persistent headache today and I will order CT of the head to rule out brain metastasis. Followup in 6 months with repeat CT scan of the chest.

## 2012-10-13 NOTE — Telephone Encounter (Signed)
appts made and printed for pt,pt sent for ct head today

## 2012-11-06 HISTORY — PX: REFRACTIVE SURGERY: SHX103

## 2012-11-27 ENCOUNTER — Emergency Department (HOSPITAL_COMMUNITY): Payer: Medicare Other

## 2012-11-27 ENCOUNTER — Encounter (HOSPITAL_COMMUNITY): Payer: Self-pay

## 2012-11-27 ENCOUNTER — Emergency Department (HOSPITAL_COMMUNITY)
Admission: EM | Admit: 2012-11-27 | Discharge: 2012-11-28 | Disposition: A | Discharge status: Home / Self Care | Payer: Medicare Other | Attending: Emergency Medicine | Admitting: Emergency Medicine

## 2012-11-27 DIAGNOSIS — K219 Gastro-esophageal reflux disease without esophagitis: Secondary | ICD-10-CM | POA: Insufficient documentation

## 2012-11-27 DIAGNOSIS — F17200 Nicotine dependence, unspecified, uncomplicated: Secondary | ICD-10-CM | POA: Insufficient documentation

## 2012-11-27 DIAGNOSIS — S42213A Unspecified displaced fracture of surgical neck of unspecified humerus, initial encounter for closed fracture: Secondary | ICD-10-CM | POA: Insufficient documentation

## 2012-11-27 DIAGNOSIS — Z8701 Personal history of pneumonia (recurrent): Secondary | ICD-10-CM | POA: Insufficient documentation

## 2012-11-27 DIAGNOSIS — W010XXA Fall on same level from slipping, tripping and stumbling without subsequent striking against object, initial encounter: Secondary | ICD-10-CM | POA: Insufficient documentation

## 2012-11-27 DIAGNOSIS — I2510 Atherosclerotic heart disease of native coronary artery without angina pectoris: Secondary | ICD-10-CM | POA: Insufficient documentation

## 2012-11-27 DIAGNOSIS — E785 Hyperlipidemia, unspecified: Secondary | ICD-10-CM | POA: Insufficient documentation

## 2012-11-27 DIAGNOSIS — Z87440 Personal history of urinary (tract) infections: Secondary | ICD-10-CM | POA: Insufficient documentation

## 2012-11-27 DIAGNOSIS — J449 Chronic obstructive pulmonary disease, unspecified: Secondary | ICD-10-CM

## 2012-11-27 DIAGNOSIS — Z860100 Personal history of colon polyps, unspecified: Secondary | ICD-10-CM | POA: Insufficient documentation

## 2012-11-27 DIAGNOSIS — Y9354 Activity, bowling: Secondary | ICD-10-CM | POA: Insufficient documentation

## 2012-11-27 DIAGNOSIS — Z87442 Personal history of urinary calculi: Secondary | ICD-10-CM | POA: Insufficient documentation

## 2012-11-27 DIAGNOSIS — E119 Type 2 diabetes mellitus without complications: Secondary | ICD-10-CM | POA: Insufficient documentation

## 2012-11-27 DIAGNOSIS — J4489 Other specified chronic obstructive pulmonary disease: Secondary | ICD-10-CM | POA: Insufficient documentation

## 2012-11-27 DIAGNOSIS — Z9861 Coronary angioplasty status: Secondary | ICD-10-CM | POA: Insufficient documentation

## 2012-11-27 DIAGNOSIS — IMO0001 Reserved for inherently not codable concepts without codable children: Secondary | ICD-10-CM | POA: Insufficient documentation

## 2012-11-27 DIAGNOSIS — Y9239 Other specified sports and athletic area as the place of occurrence of the external cause: Secondary | ICD-10-CM | POA: Insufficient documentation

## 2012-11-27 DIAGNOSIS — I10 Essential (primary) hypertension: Secondary | ICD-10-CM | POA: Insufficient documentation

## 2012-11-27 DIAGNOSIS — Y92838 Other recreation area as the place of occurrence of the external cause: Secondary | ICD-10-CM | POA: Insufficient documentation

## 2012-11-27 DIAGNOSIS — Z8739 Personal history of other diseases of the musculoskeletal system and connective tissue: Secondary | ICD-10-CM | POA: Insufficient documentation

## 2012-11-27 DIAGNOSIS — Z8719 Personal history of other diseases of the digestive system: Secondary | ICD-10-CM | POA: Insufficient documentation

## 2012-11-27 DIAGNOSIS — Z85118 Personal history of other malignant neoplasm of bronchus and lung: Secondary | ICD-10-CM | POA: Insufficient documentation

## 2012-11-27 DIAGNOSIS — Z794 Long term (current) use of insulin: Secondary | ICD-10-CM | POA: Insufficient documentation

## 2012-11-27 MED ORDER — ALBUTEROL SULFATE (5 MG/ML) 0.5% IN NEBU
2.5000 mg | INHALATION_SOLUTION | Freq: Once | RESPIRATORY_TRACT | Status: AC
  Administered 2012-11-27: 2.5 mg via RESPIRATORY_TRACT
  Filled 2012-11-27: qty 0.5

## 2012-11-27 MED ORDER — PREDNISONE 50 MG PO TABS: ORAL_TABLET | ORAL | Status: DC

## 2012-11-27 MED ORDER — ONDANSETRON HCL 4 MG/2ML IJ SOLN
4.0000 mg | Freq: Once | INTRAMUSCULAR | Status: AC
  Administered 2012-11-27: 4 mg via INTRAVENOUS
  Filled 2012-11-27: qty 2

## 2012-11-27 MED ORDER — PREDNISONE 10 MG PO TABS
60.0000 mg | ORAL_TABLET | Freq: Once | ORAL | Status: AC
  Administered 2012-11-27: 60 mg via ORAL
  Filled 2012-11-27: qty 1

## 2012-11-27 MED ORDER — HYDROMORPHONE HCL PF 1 MG/ML IJ SOLN
1.0000 mg | Freq: Once | INTRAMUSCULAR | Status: AC
  Administered 2012-11-27: 1 mg via INTRAVENOUS
  Filled 2012-11-27: qty 1

## 2012-11-27 MED ORDER — IPRATROPIUM BROMIDE 0.02 % IN SOLN
0.5000 mg | Freq: Once | RESPIRATORY_TRACT | Status: AC
  Administered 2012-11-27: 0.5 mg via RESPIRATORY_TRACT
  Filled 2012-11-27: qty 2.5

## 2012-11-27 MED ORDER — DOXYCYCLINE HYCLATE 100 MG PO CAPS: 100.0000 mg | ORAL_CAPSULE | Freq: Two times a day (BID) | ORAL | Status: DC

## 2012-11-27 MED ORDER — OXYCODONE-ACETAMINOPHEN 5-325 MG PO TABS: 2.0000 | ORAL_TABLET | ORAL | Status: DC | PRN

## 2012-11-27 NOTE — ED Notes (Signed)
[place], went down to my knees and then my shoulder hit and my face hit. Having pain in left shoulder down to my finger tips per pt.

## 2012-11-27 NOTE — ED Provider Notes (Signed)
History     CSN: [id number]  Arrival date & time 11/27/12  4098   First MD Initiated Contact with Patient 11/27/12 1929      Chief Complaint  Patient presents with  . Arm Injury    (Consider location/radiation/quality/duration/timing/severity/associated sxs/prior treatment) HPI Comments: Patient [place] fell to her knees and left shoulder. Complains of pain in the left shoulder radiating down the arm. Denies hitting head or losing consciousness. Denies neck, chest, back or abdominal pain. She is pain in the left shoulder and upper arm. Denies any weakness, numbness or tingling. She's not take any anticoagulants.  The history is provided by the patient.    Past Medical History  Diagnosis Date  . Asthma   . Cholelithiasis   . Colon polyps   . Coronary artery disease     The pt had stenting of the midsegment of the right coronary artery in 2005 with the Cypher stent. The last catheterization demonstrated an EF of 60%. The right coronary artery stent was patent. The left  main was normal. Circumflex had luminal irregularities. The LAD had luminal irregularities.   . Diabetes mellitus     Type II  . GERD (gastroesophageal reflux disease)   . Hyperlipidemia   . Hypertension   . Lung cancer 2010    Adenocarcinoma, right lung, node positive  . Tobacco abuse   . COPD (chronic obstructive pulmonary disease)   . Pneumonia   . Bronchitis   . Shortness of breath     with ambulation  . Arthritis   . Kidney stones     hx of  . Urgency of urination     Past Surgical History  Procedure Laterality Date  . Appendectomy  1970  . Gallbladder surgery  1989  . Abdominal hysterectomy  1972  . Tonsillectomy  1966  . Carpal tunnel release    . Replacement total knee  2001    left  . Neck surgery  2005  . Back surgery  2007  . Pneumonectomy      Right Partial, lung cancer confirmed, nod positive  . Right upper lobectomy with lymph node dissection   10/12/2008    Dr Edwyna Shell  . Cardiac catheterization    . Coronary angioplasty  2005  . Portacath placement  12/12/2007  . Eye surgery      implants 02/18/12 (Left) 02/25/12 (right eye)  . Joint replacement    . Port-a-cath removal  05/12/2012    Procedure: REMOVAL PORT-A-CATH;  Surgeon: Loreli Slot, MD;  Location: [facility] OR;  Service: Thoracic;  Laterality: Left;    Family History  Problem Relation Age of Onset  . Diabetes Mother   . Diabetes Father     History  Substance Use Topics  . Smoking status: Current Every Day Smoker -- 1.00 packs/day for 45 years    Types: Cigarettes  . Smokeless tobacco: Not on file  . Alcohol Use: No    OB History   Grav Para Term Preterm Abortions TAB SAB Ect Mult Living                  Review of Systems  Constitutional: Negative for fever, activity change and appetite change.  HENT: Negative for congestion and rhinorrhea.   Respiratory: Negative for cough, chest tightness and shortness of breath.   Cardiovascular: Negative for chest pain.  Gastrointestinal: Negative for nausea, vomiting and abdominal pain.  Genitourinary: Negative for dysuria, vaginal bleeding and vaginal  discharge.  Musculoskeletal: Positive for myalgias and arthralgias. Negative for back pain.  Skin: Negative for rash.  Neurological: Negative for dizziness, weakness and headaches.  A complete 10 system review of systems was obtained and all systems are negative except as noted in the HPI and PMH.    Allergies  Niacin and Varenicline tartrate  Home Medications   Current Outpatient Rx  Name  Route  Sig  Dispense  Refill  . albuterol-ipratropium (COMBIVENT) 18-103 MCG/ACT inhaler   Inhalation   Inhale 2 puffs into the lungs as needed for wheezing or shortness of breath.          . Cholecalciferol (VITAMIN D) 1000 UNITS capsule   Oral   Take 1,000 Units by mouth every evening.          [name] esomeprazole (NEXIUM) 40 MG capsule   Oral   Take 40 mg by mouth 2  (two) times daily as needed (for heartburm/ acid reflux).         . fexofenadine-pseudoephedrine (ALLEGRA-D 24) 180-240 MG per 24 hr tablet   Oral   Take 1 tablet by mouth every evening.         [name] ibuprofen (ADVIL,MOTRIN) 200 MG tablet   Oral   Take 400 mg by mouth every 6 (six) hours as needed for pain.         [name] insulin glargine (LANTUS) 100 UNIT/ML injection   Subcutaneous   Inject 50 Units into the skin at bedtime.          . insulin glulisine (APIDRA) 100 UNIT/ML injection      3 (three) times daily before meals. Three times a day (just before each meal) 20-20-25 units         . ipratropium (ATROVENT) 0.02 % nebulizer solution   Nebulization   Take 500 mcg by nebulization every 6 (six) hours as needed.           Bertram Gala Glycol-Propyl Glycol (SYSTANE) 0.4-0.3 % SOLN   Ophthalmic   Apply 1-2 drops to eye daily as needed (for dry eye relief).         [name] PROAIR HFA 108 (90 BASE) MCG/ACT inhaler   Inhalation   Inhale 1-2 puffs into the lungs every 6 (six) hours as needed. For shortness of breath/wheezing         . HYDROcodone-acetaminophen (NORCO) 10-325 MG per tablet   Oral   Take 1 tablet by mouth every 6 (six) hours as needed for pain (Taken for back pain).            BP 155/79  Temp(Src) 97.7 F (36.5 C) (Oral)  Resp 20  SpO2 94%  Physical Exam  Constitutional: She is oriented to person, place, and time. She appears well-developed and well-nourished. No distress.  HENT:  Head: Normocephalic and atraumatic.  Mouth/Throat: Oropharynx is clear and moist. No oropharyngeal exudate.  Eyes: Conjunctivae and EOM are normal. Pupils are equal, round, and reactive to light.  Neck: Normal range of motion. Neck supple.  No c spine tenderness  Cardiovascular: Normal rate, regular rhythm and normal heart sounds.   No murmur heard. Pulmonary/Chest: Effort normal and breath sounds normal. No respiratory distress.  Abdominal: Soft. There is no tenderness.  There is no rebound and no guarding.  Musculoskeletal: Normal range of motion. She exhibits tenderness.  TTP L shoulder, upper arm/  +2 radial pulse, grip strength intact, cardinal hand movement intact.  Neurological: She is alert and oriented to person, place, and  time. No cranial nerve deficit. She exhibits normal muscle tone. Coordination normal.  Skin: Skin is warm.    ED Course  Procedures (including critical care time)  Labs Reviewed - No data to display Dg Chest 1 View  11/27/2012  *RADIOLOGY REPORT*  Clinical Data: Fall.  Pain.  CHEST - 1 VIEW  Comparison: 08/31/2012  Findings: There is chronic pulmonary scarring.  Markings appear more accentuated in the left lower lobe.  There could be active atelectasis or pneumonia in that region.  No pneumothorax.  No effusion.  No fracture of the central chest structures.  IMPRESSION: Chronic pulmonary scarring.  Markings appear more prominent in the left lower lobe.  This raises the possibility of atelectasis or pneumonia in the left lower lobe.   Original Report Authenticated By: Paulina Fusi, M.D.    Dg Elbow 2 Views Left  11/27/2012  *RADIOLOGY REPORT*  Clinical Data: Pain.  Fall.  LEFT ELBOW - 2 VIEW  Comparison: None  Findings:  Two-view exam with atypical positioning does not show any evidence of fracture or dislocation.  IMPRESSION: Negative atypically positioned two-view exam   Original Report Authenticated By: Paulina Fusi, M.D.    Dg Shoulder Left  11/27/2012  *RADIOLOGY REPORT*  Clinical Data: Severe left shoulder pain following a fall today.  LEFT SHOULDER - 2+ VIEW  Comparison: None.  Findings: Mildly impacted and comminuted left humeral neck and greater tuberosity fracture.  Mild anterior/medial displacement and lateral/posterior angulation of the distal fragment.  IMPRESSION: Comminuted humeral neck and greater tuberosity fracture, as described above.   Original Report Authenticated By: Beckie Salts, M.D.    Dg Humerus Left  11/27/2012   *RADIOLOGY REPORT*  Clinical Data: Fall.  Pain.  LEFT HUMERUS - 2+ VIEW  Comparison: Same day  Findings: Humeral neck fracture as described at the shoulder radiography study.  No fracture distal to that.  IMPRESSION: Humeral neck fracture.  See shoulder exam.  No fracture distal to that.   Original Report Authenticated By: Paulina Fusi, M.D.      No diagnosis found.    MDM  Mechanical fall over step [place] bowling [place] falling down to her knees and hitting left shoulder. Denies any head or lose consciousness. Not on anticoagulation.  Pain to palpation of left shoulder without obvious deformity. No chest, abdomen, back, head or neck pain.  X-rays remarkable for comminuted humeral neck and greater tuberosity fracture.  radial pulse remains intact. Cardinal hand movements are intact, axillary nerve sensation is intact. Discussed with Dr. Romeo Apple who recommends sling-and-swathe and outpatient followup.  Patient initially hypoxic on arrival 89%. She is on 2 L and saturating 96%. She has a history of COPD as well as lung cancer and does wear oxygen at night and intermittently during the day. States her breathing a little worse than usual. Denies any chest pain or fever. She has had a nonproductive cough. Chest x-ray shows prominent scarring bilaterally. She is given nebulizers, steroids and antibiotics for potential possible pneumonia.     Glynn Octave, MD 11/28/12 1141

## 2012-11-28 NOTE — ED Provider Notes (Signed)
Assumed care in signout Pt improved, denies any SOB.  She feels at baseline Immobilizer ordered for patient She is aware of need for ortho f/u Family at bedside will be staying with patient tonight  Joya Gaskins, MD 11/28/12 [phone]

## 2012-11-28 NOTE — ED Notes (Signed)
Pt discharged. Pt stable at time of discharge. Medications reviewed pt has no questions regarding discharge at this time. Pt voiced understanding of discharge instructions.  

## 2012-11-29 ENCOUNTER — Ambulatory Visit (INDEPENDENT_AMBULATORY_CARE_PROVIDER_SITE_OTHER): Payer: Medicare Other | Admitting: Orthopedic Surgery

## 2012-11-29 ENCOUNTER — Encounter: Payer: Self-pay | Admitting: Orthopedic Surgery

## 2012-11-29 VITALS — BP 150/72 | Ht 61.5 in | Wt 203.2 lb

## 2012-11-29 DIAGNOSIS — S42209A Unspecified fracture of upper end of unspecified humerus, initial encounter for closed fracture: Secondary | ICD-10-CM

## 2012-11-29 DIAGNOSIS — S42309A Unspecified fracture of shaft of humerus, unspecified arm, initial encounter for closed fracture: Secondary | ICD-10-CM

## 2012-11-29 DIAGNOSIS — S42202A Unspecified fracture of upper end of left humerus, initial encounter for closed fracture: Secondary | ICD-10-CM

## 2012-11-29 DIAGNOSIS — S4292XA Fracture of left shoulder girdle, part unspecified, initial encounter for closed fracture: Secondary | ICD-10-CM

## 2012-11-29 DIAGNOSIS — S4290XA Fracture of unspecified shoulder girdle, part unspecified, initial encounter for closed fracture: Secondary | ICD-10-CM | POA: Insufficient documentation

## 2012-11-29 MED ORDER — OXYCODONE-ACETAMINOPHEN 5-325 MG PO TABS: 1.0000 | ORAL_TABLET | ORAL | Status: DC | PRN

## 2012-11-29 NOTE — Patient Instructions (Addendum)
Continue sling and swathe

## 2012-11-29 NOTE — Progress Notes (Signed)
Patient ID: [name], female   DOB: Aug 30, 1946, 67 y.o.   MRN: [id number] Chief Complaint  Patient presents with  . Shoulder Pain    left shoulder fracture DOI 11/27/12    History of present illness date of injury every 22nd 2014 mechanism fall  Chief complaint pain in the left arm and left shoulder down to the elbow  ER visit on February 22  Pain description sharp, 10 out of 10, constant, improved with immobilization worse with moving and sitting down and standing up all 14 systems were reviewed the patient has had some recent medical illnesses including cancer Port-A-Cath placement  Complains of fatigue shortness of breath wheezing and cough has COPD. Complains of heartburn. Complains of excessive thirst and excessive urination and seasonal allergies  Otherwise the remaining systems were reviewed and were normal  Past Medical History  Diagnosis Date  . Asthma   . Cholelithiasis   . Colon polyps   . Coronary artery disease     The pt had stenting of the midsegment of the right coronary artery in 2005 with the Cypher stent. The last catheterization demonstrated an EF of 60%. The right coronary artery stent was patent. The left  main was normal. Circumflex had luminal irregularities. The LAD had luminal irregularities.   . Diabetes mellitus     Type II  . GERD (gastroesophageal reflux disease)   . Hyperlipidemia   . Hypertension   . Lung cancer 2010    Adenocarcinoma, right lung, node positive  . Tobacco abuse   . COPD (chronic obstructive pulmonary disease)   . Pneumonia   . Bronchitis   . Shortness of breath     with ambulation  . Arthritis   . Kidney stones     hx of  . Urgency of urination     Physical Exam(12)  Vital signs: BP 150/72  Ht 5' 1.5" (1.562 m)  Wt 203 lb 3.2 oz (92.171 kg)  BMI 37.78 kg/m2   GENERAL: normal development   CDV: pulses are normal   Skin: normal  Lymph: nodes were not palpable/normal  Psychiatric: awake, alert and  oriented  Neuro: normal sensation  MSK  Gait: No real abnormalities acutely 1 Inspection left shoulder swollen and tender the arm is held close to the side and we did not attempt range of motion to avoid putting the patient in more pain she can move her hand and fingers as well as her wrist muscle tone is normal stability tests are deferred because of pain  Her right side is normal she had a cuff repair but shows full range of motion normal strength no instability Imaging hospital films show nondisplaced fracture involving the greater tuberosity and proximal humerus or fractures meet criteria for nonoperative treatment  Assessment: Shoulder fracture, left, closed, initial encounter - Plan: oxyCODONE-acetaminophen (ROXICET) 5-325 MG per tablet  Proximal humerus fracture, left, closed, initial encounter      Plan: Recommend sling-and-swathe for 2 weeks continue Percocet for pain  X-ray in 2 weeks and then start therapy if x-ray looks good

## 2012-12-04 HISTORY — PX: OTHER SURGICAL HISTORY: SHX169

## 2012-12-08 ENCOUNTER — Encounter (HOSPITAL_COMMUNITY): Payer: Self-pay

## 2012-12-09 ENCOUNTER — Other Ambulatory Visit (HOSPITAL_COMMUNITY): Payer: Self-pay | Admitting: Orthopaedic Surgery

## 2012-12-13 ENCOUNTER — Telehealth: Payer: Self-pay | Admitting: Orthopedic Surgery

## 2012-12-13 ENCOUNTER — Encounter (HOSPITAL_COMMUNITY): Payer: Self-pay

## 2012-12-13 ENCOUNTER — Encounter (HOSPITAL_COMMUNITY)
Admission: RE | Admit: 2012-12-13 | Discharge: 2012-12-13 | Disposition: A | Discharge status: Home / Self Care | Payer: Medicare Other | Source: Ambulatory Visit | Attending: Surgery | Admitting: Surgery

## 2012-12-13 HISTORY — DX: Fibromyalgia: M79.7

## 2012-12-13 HISTORY — DX: Emphysema, unspecified: J43.9

## 2012-12-13 HISTORY — DX: Personal history of other diseases of the digestive system: Z87.19

## 2012-12-13 LAB — CBC
Platelets: 159 10*3/uL (ref 150–400)
RBC: 5.49 MIL/uL — ABNORMAL HIGH (ref 3.87–5.11)
RDW: 14.7 % (ref 11.5–15.5)
WBC: 9.2 10*3/uL (ref 4.0–10.5)

## 2012-12-13 LAB — SURGICAL PCR SCREEN
MRSA, PCR: NEGATIVE
Staphylococcus aureus: NEGATIVE

## 2012-12-13 LAB — BASIC METABOLIC PANEL
CO2: 27 mEq/L (ref 19–32)
Chloride: 102 mEq/L (ref 96–112)
GFR calc Af Amer: >90 mL/min (ref >90)
Potassium: 5.1 mEq/L (ref 3.5–5.1)

## 2012-12-13 MED ORDER — CEFAZOLIN SODIUM-DEXTROSE 2-3 GM-% IV SOLR
2.0000 g | INTRAVENOUS | Status: AC
  Administered 2012-12-14: 2 g via INTRAVENOUS
  Filled 2012-12-13: qty 50

## 2012-12-13 NOTE — Progress Notes (Signed)
12/13/12 0920  OBSTRUCTIVE SLEEP APNEA  Have you ever been diagnosed with sleep apnea through a sleep study? No  Do you snore loudly (loud enough to be heard through closed doors)?  1  Do you often feel tired, fatigued, or sleepy during the daytime? 1  Has anyone observed you stop breathing during your sleep? 0  Do you have, or are you being treated for high blood pressure? 0  BMI more than 35 kg/m2? 1  Age over 67 years old? 1  Neck circumference greater than 40 cm/18 inches? 0 (15 IN  )  Gender: 0  Obstructive Sleep Apnea Score 4  Score 4 or greater  Results sent to PCP

## 2012-12-13 NOTE — Pre-Procedure Instructions (Signed)
ADDILEE NEU  12/13/2012   Your procedure is scheduled on: Tuesday  12/14/12   Report to [facility] at 100 PM.  Call this number if you have problems the morning of surgery: [phone]   Remember:   Do not eat food or drink liquids after midnight.   Take these medicines the morning of surgery with A SIP OF WATER: BREATHING TREATMENTS, NEXIUM, OXYCODONE IF NEEDED FOR PAIN,  EYE DROPS   Do not wear jewelry, make-up or nail polish.  Do not wear lotions, powders, or perfumes. You may wear deodorant.  Do not shave 48 hours prior to surgery. Men may shave face and neck.  Do not bring valuables to the hospital.  Contacts, dentures or bridgework may not be worn into surgery.  Leave suitcase in the car. After surgery it may be brought to your room.  For patients admitted to the hospital, checkout time is 11:00 AM the day of  discharge.   Patients discharged the day of surgery will not be allowed to drive  home.  Name and phone number of your driver:   Special Instructions: Shower using CHG 2 nights before surgery and the night before surgery.  If you shower the day of surgery use CHG.  Use special wash - you have one bottle of CHG for all showers.  You should use approximately 1/3 of the bottle for each shower.   Please read over the following fact sheets that you were given: Pain Booklet, Coughing and Deep Breathing, MRSA Information and Surgical Site Infection Prevention

## 2012-12-13 NOTE — Telephone Encounter (Signed)
[name] is scheduled tomorrow (12/14/12) for 2 week xr of her shoulder.  She left a message to cancel the appointment.  I called her back to reschedule and she said she does not need to reschedule.  Said she went to another doctor and is scheduled to have surgery on her shoulder tomorrow.

## 2012-12-13 NOTE — Progress Notes (Signed)
SPOKE WITH ALLISON ZELENAK RE: PATIENT STATING DR. Magnus Ivan SAID SHE COULD EAT LIGHT BREAKFAST AS LONG AS WAS BY 600 AM.  ALLISON STATED WAS OKAY AND DEPENDING ON BLOOD SUGAR READING IN AM, SHE MAY WANT TO TAKE AM DOSE OF NOVOLOG INSULIN.  ALLISON WAS ALSO MADE AWARE OF PATIENT'S  PULMONARY HX, USING 3 LITERS O2 HS AND PRN DAYTIME.  ALLISON WILL SEE PATIENT FOR CONSULT.

## 2012-12-13 NOTE — Progress Notes (Signed)
Anesthesia PAT Evaluation:  Patient is a 67 year old female scheduled for ORIF of left proximal humerus fracture on 12/14/12 by Dr. Doneen Poisson.  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    During her 11/27/12 ED visit for her humerus fracture, she was also treated (Doxycycline, prednisone taper) for possible LLL PNA per CXR findings and hypoxemia of 89%.  She had been recovering from a recent URI at that time.  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 OSA screening score was 4.  PCP is Dr. Butch Penny.   Has seen Pulmonologist Dr. Vassie Loll in the past, but not since 2012.  Oncologist is Dr. [name].  Cardiologist Dr. Antoine Poche, last visit 07/08/12.  She describes discomfort in her left breast periodically at that time.  She had had a normal stress echo in 2012 and her symptoms were felt unchanged so no further testing was recommended at that time.  EKG then showed SR with first degree AVB, LAFB, septal infarct (age undetermined).  It appears stable since 07/17/11.  She had a normal dobutamine stress echo on 07/18/11.   Cardiac cath on 03/02/07 showed: 1. Normal LV systolic function, ejection fraction 60%.  2. Widely patent previously placed stents in the mid right coronary artery in 2005.  3. Mild luminal irregularity of the left system (left main normal, LAD and CX with mild luminal irregularities).  She reports chronic night time O2 @ 3L/[place] use since her RU lobectomy. Since her humerus fracture, she has had intermittent daytime use as well--more based on when her oxygen levels are 90-92% rather than symptoms.  She notices more supplemental O2 use after taking pain meds.  She denies fever.  She has chronic DOE with  minimal activity such as making a bed which may be slightly worse since her URI 11/2012, but not significantly changed for years.  She also has a chronic intermittent productive cough that she feels is also stable.  She used her albuterol MDI once yesterday, but overall has not needed frequent use of her rescue inhaler.  She has completed her antibiotic and prednisone taper.  She plans to use her nebulizer tonight and tomorrow in anticipation of surgery.  From a cardiac standpoint she feels at baseline.  She continues to have intermittent "soreness" over her left breast.  She has had this for years.  She does not feel the quality or characteristics have changed.  This is described in Dr. Jenene Slicker last note as mentioned above.  Her activity is limited by her breathing.  She continues to smoke.  Exam shows a pleasant Caucaian obese female, in no acute distress.  I did not note any conversational dyspnea.  Heart RRR, there was a rare ectopic beat.  No murmur noted.  Lungs with few coarse inspiratory and expiratory wheezes, more notable on the left side.  There was mild generalized BLE edema.   O2 sat was 93% on RA today.  She reports that her glucose readings are typically in the 200-300 range.  Follow-up 2V CXR today showed: Improving left lower lobe opacity. Stable bibasilar scarring. Postoperative changes on the right.  Preoperative labs noted.  Glucose 213.  She will get a CBG on arrival.  Per patient, Dr. Magnus Ivan told her  that she could eat a light breakfast up to 0600.  Her surgery is not scheduled until 1500, so she should meet NPO standards.  She was instructed that she could arrive early if there was any concern for hypo- or hyper- glycemia.  Due to her cardiopulmonary history, I did review above with Anesthesiologist Dr. Jacklynn Bue.  Since she denies any new cardiac symptoms since her last visit with Dr. Antoine Poche and had a normal stress echo within the past two years, her pulmonary status is felt to be  her highest risk.  She may require additional time on the ventilator and may not be a candidate for a block.  She is aware of this.  She will be evaluated by her assigned anesthesiologist on the day of surgery.  If no worrisome cardiopulmonary symptoms/findings then anticipate she can proceed as planned.  Velna Ochs [facility]/Anesthesiology Phone [phone] 12/13/2012 1:09 PM

## 2012-12-13 NOTE — Progress Notes (Signed)
REQUESTED LAST OV AND EKG FROM [place].

## 2012-12-14 ENCOUNTER — Ambulatory Visit (HOSPITAL_COMMUNITY): Payer: Medicare Other | Admitting: Certified Registered Nurse Anesthetist

## 2012-12-14 ENCOUNTER — Ambulatory Visit (HOSPITAL_COMMUNITY)
Admission: RE | Admit: 2012-12-14 | Discharge: 2012-12-15 | Disposition: A | Discharge status: Home / Self Care | Payer: Medicare Other | Source: Ambulatory Visit | Attending: Orthopaedic Surgery | Admitting: Orthopaedic Surgery

## 2012-12-14 ENCOUNTER — Ambulatory Visit: Payer: Medicare Other | Admitting: Orthopedic Surgery

## 2012-12-14 ENCOUNTER — Encounter (HOSPITAL_COMMUNITY)
Admission: RE | Disposition: A | Discharge status: Home / Self Care | Payer: Self-pay | Source: Ambulatory Visit | Attending: Orthopaedic Surgery

## 2012-12-14 ENCOUNTER — Encounter (HOSPITAL_COMMUNITY): Payer: Self-pay | Admitting: Vascular Surgery

## 2012-12-14 ENCOUNTER — Ambulatory Visit (HOSPITAL_COMMUNITY): Payer: Medicare Other

## 2012-12-14 DIAGNOSIS — Z01812 Encounter for preprocedural laboratory examination: Secondary | ICD-10-CM | POA: Insufficient documentation

## 2012-12-14 DIAGNOSIS — I10 Essential (primary) hypertension: Secondary | ICD-10-CM | POA: Insufficient documentation

## 2012-12-14 DIAGNOSIS — S42253A Displaced fracture of greater tuberosity of unspecified humerus, initial encounter for closed fracture: Secondary | ICD-10-CM | POA: Insufficient documentation

## 2012-12-14 DIAGNOSIS — W19XXXA Unspecified fall, initial encounter: Secondary | ICD-10-CM | POA: Insufficient documentation

## 2012-12-14 DIAGNOSIS — J449 Chronic obstructive pulmonary disease, unspecified: Secondary | ICD-10-CM | POA: Insufficient documentation

## 2012-12-14 DIAGNOSIS — F17200 Nicotine dependence, unspecified, uncomplicated: Secondary | ICD-10-CM | POA: Insufficient documentation

## 2012-12-14 DIAGNOSIS — J4489 Other specified chronic obstructive pulmonary disease: Secondary | ICD-10-CM | POA: Insufficient documentation

## 2012-12-14 DIAGNOSIS — S4292XA Fracture of left shoulder girdle, part unspecified, initial encounter for closed fracture: Secondary | ICD-10-CM

## 2012-12-14 DIAGNOSIS — Z01818 Encounter for other preprocedural examination: Secondary | ICD-10-CM | POA: Insufficient documentation

## 2012-12-14 DIAGNOSIS — E119 Type 2 diabetes mellitus without complications: Secondary | ICD-10-CM | POA: Insufficient documentation

## 2012-12-14 DIAGNOSIS — S42293A Other displaced fracture of upper end of unspecified humerus, initial encounter for closed fracture: Secondary | ICD-10-CM

## 2012-12-14 HISTORY — PX: ORIF HUMERUS FRACTURE: SHX2126

## 2012-12-14 LAB — GLUCOSE, CAPILLARY
Glucose-Capillary: 139 mg/dL — ABNORMAL HIGH (ref 70–99)
Glucose-Capillary: 196 mg/dL — ABNORMAL HIGH (ref 70–99)
Glucose-Capillary: 195 mg/dL — ABNORMAL HIGH (ref 70–99)

## 2012-12-14 SURGERY — OPEN REDUCTION INTERNAL FIXATION (ORIF) PROXIMAL HUMERUS FRACTURE
Anesthesia: General | Site: Arm Upper | Laterality: Left | Wound class: Clean

## 2012-12-14 MED ORDER — ZOLPIDEM TARTRATE 5 MG PO TABS: 5.0000 mg | ORAL_TABLET | Freq: Every evening | ORAL | Status: DC | PRN

## 2012-12-14 MED ORDER — OXYCODONE HCL 5 MG PO TABS
5.0000 mg | ORAL_TABLET | ORAL | Status: DC | PRN
  Administered 2012-12-14 – 2012-12-15 (×4): 10 mg via ORAL
  Filled 2012-12-14 (×3): qty 2

## 2012-12-14 MED ORDER — INSULIN ASPART 100 UNIT/ML ~~LOC~~ SOLN
0.0000 [IU] | Freq: Three times a day (TID) | SUBCUTANEOUS | Status: DC
  Administered 2012-12-15: 3 [IU] via SUBCUTANEOUS

## 2012-12-14 MED ORDER — CEFAZOLIN SODIUM 1-5 GM-% IV SOLN
1.0000 g | Freq: Four times a day (QID) | INTRAVENOUS | Status: AC
  Administered 2012-12-14 – 2012-12-15 (×3): 1 g via INTRAVENOUS
  Filled 2012-12-14 (×4): qty 50

## 2012-12-14 MED ORDER — LACTATED RINGERS IV SOLN
INTRAVENOUS | Status: DC | PRN
  Administered 2012-12-14 (×2): via INTRAVENOUS

## 2012-12-14 MED ORDER — ALBUTEROL SULFATE HFA 108 (90 BASE) MCG/ACT IN AERS
1.0000 | INHALATION_SPRAY | Freq: Four times a day (QID) | RESPIRATORY_TRACT | Status: DC | PRN
  Filled 2012-12-14: qty 6.7

## 2012-12-14 MED ORDER — MORPHINE SULFATE 2 MG/ML IJ SOLN: 1.0000 mg | INTRAMUSCULAR | Status: DC | PRN

## 2012-12-14 MED ORDER — PHENYLEPHRINE HCL 10 MG/ML IJ SOLN
10.0000 mg | INTRAVENOUS | Status: DC | PRN
  Administered 2012-12-14: 50 ug/min via INTRAVENOUS

## 2012-12-14 MED ORDER — ONDANSETRON HCL 4 MG/2ML IJ SOLN: 4.0000 mg | Freq: Four times a day (QID) | INTRAMUSCULAR | Status: DC | PRN

## 2012-12-14 MED ORDER — HYDROMORPHONE HCL PF 1 MG/ML IJ SOLN
INTRAMUSCULAR | Status: AC
  Filled 2012-12-14: qty 1

## 2012-12-14 MED ORDER — INSULIN GLARGINE 100 UNIT/ML ~~LOC~~ SOLN
50.0000 [IU] | Freq: Every day | SUBCUTANEOUS | Status: DC
  Administered 2012-12-14: 50 [IU] via SUBCUTANEOUS

## 2012-12-14 MED ORDER — LIDOCAINE HCL (CARDIAC) 20 MG/ML IV SOLN
INTRAVENOUS | Status: DC | PRN
  Administered 2012-12-14: 100 mg via INTRAVENOUS

## 2012-12-14 MED ORDER — BUPIVACAINE HCL (PF) 0.25 % IJ SOLN
INTRAMUSCULAR | Status: AC
  Filled 2012-12-14: qty 30

## 2012-12-14 MED ORDER — METHOCARBAMOL 500 MG PO TABS
500.0000 mg | ORAL_TABLET | Freq: Four times a day (QID) | ORAL | Status: DC | PRN
  Administered 2012-12-15: 500 mg via ORAL
  Filled 2012-12-14 (×2): qty 1

## 2012-12-14 MED ORDER — IPRATROPIUM BROMIDE 0.02 % IN SOLN: 500.0000 ug | Freq: Four times a day (QID) | RESPIRATORY_TRACT | Status: DC | PRN

## 2012-12-14 MED ORDER — ONDANSETRON HCL 4 MG/2ML IJ SOLN
INTRAMUSCULAR | Status: DC | PRN
  Administered 2012-12-14: 4 mg via INTRAVENOUS

## 2012-12-14 MED ORDER — BUPIVACAINE-EPINEPHRINE 0.25% -1:200000 IJ SOLN
INTRAMUSCULAR | Status: AC
  Filled 2012-12-14: qty 1

## 2012-12-14 MED ORDER — FENTANYL CITRATE 0.05 MG/ML IJ SOLN: 50.0000 ug | INTRAMUSCULAR | Status: DC | PRN

## 2012-12-14 MED ORDER — HYDROCODONE-ACETAMINOPHEN 5-325 MG PO TABS
1.0000 | ORAL_TABLET | ORAL | Status: DC | PRN
  Administered 2012-12-14: 2 via ORAL

## 2012-12-14 MED ORDER — ROCURONIUM BROMIDE 100 MG/10ML IV SOLN
INTRAVENOUS | Status: DC | PRN
  Administered 2012-12-14: 50 mg via INTRAVENOUS

## 2012-12-14 MED ORDER — LACTATED RINGERS IV SOLN
INTRAVENOUS | Status: DC
  Administered 2012-12-14: 15:00:00 via INTRAVENOUS

## 2012-12-14 MED ORDER — BUPIVACAINE HCL (PF) 0.25 % IJ SOLN
INTRAMUSCULAR | Status: DC | PRN
  Administered 2012-12-14: 10 mL

## 2012-12-14 MED ORDER — HYDROCODONE-ACETAMINOPHEN 5-325 MG PO TABS
ORAL_TABLET | ORAL | Status: AC
  Administered 2012-12-14: 1
  Filled 2012-12-14: qty 1

## 2012-12-14 MED ORDER — INSULIN ASPART 100 UNIT/ML ~~LOC~~ SOLN: 10.0000 [IU] | Freq: Three times a day (TID) | SUBCUTANEOUS | Status: DC

## 2012-12-14 MED ORDER — FENTANYL CITRATE 0.05 MG/ML IJ SOLN
INTRAMUSCULAR | Status: DC | PRN
  Administered 2012-12-14: 50 ug via INTRAVENOUS
  Administered 2012-12-14: 100 ug via INTRAVENOUS
  Administered 2012-12-14 (×2): 50 ug via INTRAVENOUS

## 2012-12-14 MED ORDER — LIDOCAINE HCL 4 % MT SOLN
OROMUCOSAL | Status: DC | PRN
  Administered 2012-12-14: 4 mL via TOPICAL

## 2012-12-14 MED ORDER — ONDANSETRON HCL 4 MG PO TABS: 4.0000 mg | ORAL_TABLET | Freq: Four times a day (QID) | ORAL | Status: DC | PRN

## 2012-12-14 MED ORDER — METHOCARBAMOL 500 MG PO TABS
ORAL_TABLET | ORAL | Status: AC
  Administered 2012-12-14: 500 mg via ORAL
  Filled 2012-12-14: qty 1

## 2012-12-14 MED ORDER — MIDAZOLAM HCL 2 MG/2ML IJ SOLN: 1.0000 mg | INTRAMUSCULAR | Status: DC | PRN

## 2012-12-14 MED ORDER — PHENYLEPHRINE HCL 10 MG/ML IJ SOLN
INTRAMUSCULAR | Status: DC | PRN
  Administered 2012-12-14 (×2): 80 ug via INTRAVENOUS
  Administered 2012-12-14: 40 ug via INTRAVENOUS

## 2012-12-14 MED ORDER — ALBUMIN HUMAN 5 % IV SOLN
INTRAVENOUS | Status: DC | PRN
  Administered 2012-12-14: 17:00:00 via INTRAVENOUS

## 2012-12-14 MED ORDER — METOCLOPRAMIDE HCL 10 MG PO TABS: 5.0000 mg | ORAL_TABLET | Freq: Three times a day (TID) | ORAL | Status: DC | PRN

## 2012-12-14 MED ORDER — DIPHENHYDRAMINE HCL 12.5 MG/5ML PO ELIX: 12.5000 mg | ORAL_SOLUTION | ORAL | Status: DC | PRN

## 2012-12-14 MED ORDER — NEOSTIGMINE METHYLSULFATE 1 MG/ML IJ SOLN
INTRAMUSCULAR | Status: DC | PRN
  Administered 2012-12-14: 4 mg via INTRAVENOUS

## 2012-12-14 MED ORDER — DEXTROSE 5 % IV SOLN
500.0000 mg | Freq: Four times a day (QID) | INTRAVENOUS | Status: DC | PRN
  Filled 2012-12-14: qty 5

## 2012-12-14 MED ORDER — HYDROMORPHONE HCL PF 1 MG/ML IJ SOLN
0.2500 mg | INTRAMUSCULAR | Status: DC | PRN
  Administered 2012-12-14: 0.25 mg via INTRAVENOUS
  Administered 2012-12-14: .25 mg via INTRAVENOUS
  Administered 2012-12-14: 0.25 mg via INTRAVENOUS
  Administered 2012-12-14: .25 mg via INTRAVENOUS

## 2012-12-14 MED ORDER — PANTOPRAZOLE SODIUM 40 MG PO TBEC: 80.0000 mg | DELAYED_RELEASE_TABLET | Freq: Every day | ORAL | Status: DC

## 2012-12-14 MED ORDER — METOCLOPRAMIDE HCL 5 MG/ML IJ SOLN: 5.0000 mg | Freq: Three times a day (TID) | INTRAMUSCULAR | Status: DC | PRN

## 2012-12-14 MED ORDER — IPRATROPIUM-ALBUTEROL 18-103 MCG/ACT IN AERO: 2.0000 | INHALATION_SPRAY | RESPIRATORY_TRACT | Status: DC | PRN

## 2012-12-14 MED ORDER — ALBUTEROL SULFATE HFA 108 (90 BASE) MCG/ACT IN AERS
INHALATION_SPRAY | RESPIRATORY_TRACT | Status: DC | PRN
  Administered 2012-12-14: 2 via RESPIRATORY_TRACT

## 2012-12-14 MED ORDER — SODIUM CHLORIDE 0.9 % IV SOLN
INTRAVENOUS | Status: DC
  Administered 2012-12-14: 23:00:00 via INTRAVENOUS

## 2012-12-14 MED ORDER — 0.9 % SODIUM CHLORIDE (POUR BTL) OPTIME
TOPICAL | Status: DC | PRN
  Administered 2012-12-14: 1000 mL

## 2012-12-14 MED ORDER — GLYCOPYRROLATE 0.2 MG/ML IJ SOLN
INTRAMUSCULAR | Status: DC | PRN
  Administered 2012-12-14: 0.6 mg via INTRAVENOUS

## 2012-12-14 MED ORDER — PROPOFOL 10 MG/ML IV BOLUS
INTRAVENOUS | Status: DC | PRN
  Administered 2012-12-14: 160 mg via INTRAVENOUS

## 2012-12-14 MED ORDER — MIDAZOLAM HCL 5 MG/5ML IJ SOLN
INTRAMUSCULAR | Status: DC | PRN
  Administered 2012-12-14: 2 mg via INTRAVENOUS

## 2012-12-14 SURGICAL SUPPLY — 61 items
BIT DRILL 2.8X4 QC CORT (BIT) ×1 IMPLANT
BIT DRILL 4 LONG FAST STEP (BIT) ×1 IMPLANT
BIT DRILL 4 SHORT FAST STEP (BIT) ×1 IMPLANT
BIT DRILL CANN 2.7X625 NONSTRL (BIT) ×1 IMPLANT
CLOTH BEACON ORANGE TIMEOUT ST (SAFETY) ×2 IMPLANT
DRAPE C-ARM 42X72 X-RAY (DRAPES) ×2 IMPLANT
DRAPE INCISE IOBAN 66X45 STRL (DRAPES) ×2 IMPLANT
DRAPE U-SHAPE 47X51 STRL (DRAPES) ×2 IMPLANT
DRSG PAD ABDOMINAL 8X10 ST (GAUZE/BANDAGES/DRESSINGS) ×4 IMPLANT
ELECT REM PT RETURN 9FT ADLT (ELECTROSURGICAL) ×2
ELECTRODE REM PT RTRN 9FT ADLT (ELECTROSURGICAL) ×1 IMPLANT
GAUZE XEROFORM 1X8 LF (GAUZE/BANDAGES/DRESSINGS) ×2 IMPLANT
GLOVE BIO SURGEON STRL SZ7.5 (GLOVE) ×1 IMPLANT
GLOVE BIOGEL PI IND STRL 7.0 (GLOVE) IMPLANT
GLOVE BIOGEL PI IND STRL 8 (GLOVE) ×2 IMPLANT
GLOVE BIOGEL PI INDICATOR 7.0 (GLOVE) ×1
GLOVE BIOGEL PI INDICATOR 8 (GLOVE) ×2
GLOVE ORTHO TXT STRL SZ7.5 (GLOVE) ×2 IMPLANT
GLOVE SURG SS PI 7.0 STRL IVOR (GLOVE) ×1 IMPLANT
GOWN PREVENTION PLUS LG XLONG (DISPOSABLE) IMPLANT
GOWN PREVENTION PLUS XLARGE (GOWN DISPOSABLE) ×4 IMPLANT
GOWN STRL NON-REIN LRG LVL3 (GOWN DISPOSABLE) ×1 IMPLANT
GOWN STRL REIN XL XLG (GOWN DISPOSABLE) ×1 IMPLANT
KIT BASIN OR (CUSTOM PROCEDURE TRAY) ×2 IMPLANT
KIT ROOM TURNOVER OR (KITS) ×2 IMPLANT
MANIFOLD NEPTUNE II (INSTRUMENTS) ×2 IMPLANT
NDL 1/2 CIR CATGUT .05X1.09 (NEEDLE) IMPLANT
NEEDLE 1/2 CIR CATGUT .05X1.09 (NEEDLE) ×2 IMPLANT
NEEDLE 22X1 1/2 (OR ONLY) (NEEDLE) IMPLANT
NS IRRIG 1000ML POUR BTL (IV SOLUTION) ×2 IMPLANT
PACK SHOULDER (CUSTOM PROCEDURE TRAY) ×2 IMPLANT
PAD ARMBOARD 7.5X6 YLW CONV (MISCELLANEOUS) ×4 IMPLANT
PEG STND 4.0X30MM (Orthopedic Implant) ×2 IMPLANT
PEG STND 4.0X35MM (Orthopedic Implant) ×6 IMPLANT
PEG STND 4.0X37.5MM (Orthopedic Implant) ×2 IMPLANT
PEGSTD 4.0X30MM (Orthopedic Implant) IMPLANT
PEGSTD 4.0X35MM (Orthopedic Implant) IMPLANT
PEGSTD 4.0X37.5MM (Orthopedic Implant) IMPLANT
PIN GUIDE SHOULDER 2.0MM (PIN) ×2 IMPLANT
PLATE SHOULDER S3 3HOLE LT (Plate) ×1 IMPLANT
SCREW CANN L THRD/38 4.0 (Screw) ×1 IMPLANT
SCREW LOCK 90D ANGLED 3.8X24 (Screw) ×1 IMPLANT
SCREW LOCK 90D ANGLED 3.8X28 (Screw) ×1 IMPLANT
SCREW MULTIDIR 3.8X26 HUMRL (Screw) ×1 IMPLANT
SPONGE GAUZE 4X4 12PLY (GAUZE/BANDAGES/DRESSINGS) ×2 IMPLANT
SPONGE LAP 4X18 X RAY DECT (DISPOSABLE) ×4 IMPLANT
STAPLER VISISTAT 35W (STAPLE) ×1 IMPLANT
SUCTION FRAZIER TIP 10 FR DISP (SUCTIONS) ×2 IMPLANT
SUT FIBERWIRE #2 38 T-5 BLUE (SUTURE) ×4
SUT VIC AB 0 CT1 27 (SUTURE) ×2
SUT VIC AB 0 CT1 27XBRD ANBCTR (SUTURE) ×2 IMPLANT
SUT VIC AB 2-0 CT1 27 (SUTURE) ×2
SUT VIC AB 2-0 CT1 TAPERPNT 27 (SUTURE) ×2 IMPLANT
SUTURE FIBERWR #2 38 T-5 BLUE (SUTURE) IMPLANT
SYR CONTROL 10ML LL (SYRINGE) IMPLANT
TAPE CLOTH SURG 6X10 WHT LF (GAUZE/BANDAGES/DRESSINGS) ×1 IMPLANT
TOWEL OR 17X24 6PK STRL BLUE (TOWEL DISPOSABLE) ×2 IMPLANT
TOWEL OR 17X26 10 PK STRL BLUE (TOWEL DISPOSABLE) ×2 IMPLANT
WASHER 7MM DIA (Washer) ×1 IMPLANT
WATER STERILE IRR 1000ML POUR (IV SOLUTION) ×1 IMPLANT
YANKAUER SUCT BULB TIP NO VENT (SUCTIONS) ×2 IMPLANT

## 2012-12-14 NOTE — Transfer of Care (Signed)
Immediate Anesthesia Transfer of Care Note  Patient: [name]  Procedure(s) Performed: Procedure(s): OPEN REDUCTION INTERNAL FIXATION (ORIF) LEFT PROXIMAL HUMERUS FRACTURE (Left)  Patient Location: PACU  Anesthesia Type:General  Level of Consciousness: awake, alert , oriented and patient cooperative  Airway & Oxygen Therapy: Patient Spontanous Breathing and Patient connected to nasal cannula oxygen  Post-op Assessment: Report given to PACU RN, Post -op Vital signs reviewed and stable and Patient moving all extremities  Post vital signs: Reviewed and stable  Complications: No apparent anesthesia complications

## 2012-12-14 NOTE — Brief Op Note (Signed)
12/14/2012  6:02 PM  PATIENT:  [name]  67 y.o. female  PRE-OPERATIVE DIAGNOSIS:  Left proximal humerus fx  POST-OPERATIVE DIAGNOSIS:  Left proximal humerus fx  PROCEDURE:  Procedure(s): OPEN REDUCTION INTERNAL FIXATION (ORIF) LEFT PROXIMAL HUMERUS FRACTURE (Left)  SURGEON:  Surgeon(s) and Role:    * Kathryne Hitch, MD - Primary  PHYSICIAN ASSISTANT:   ASSISTANTS: Rexene Edison, PA-C   ANESTHESIA:   general  EBL:  Total I/O In: 1250 [I.V.:1000; IV Piggyback:250] Out: -   BLOOD ADMINISTERED:none  DRAINS: none   LOCAL MEDICATIONS USED:  MARCAINE     SPECIMEN:  No Specimen  DISPOSITION OF SPECIMEN:  N/A  COUNTS:  YES  TOURNIQUET:  * No tourniquets in log *  DICTATION: .Other Dictation: Dictation Number [id number]  PLAN OF CARE: Admit for overnight observation  PATIENT DISPOSITION:  PACU - hemodynamically stable.   Delay start of Pharmacological VTE agent (>24hrs) due to surgical blood loss or risk of bleeding: not applicable

## 2012-12-14 NOTE — Anesthesia Postprocedure Evaluation (Signed)
  Anesthesia Post-op Note  Patient: [name]  Procedure(s) Performed: Procedure(s): OPEN REDUCTION INTERNAL FIXATION (ORIF) LEFT PROXIMAL HUMERUS FRACTURE (Left)  Patient Location: PACU  Anesthesia Type:General  Level of Consciousness: awake  Airway and Oxygen Therapy: Patient Spontanous Breathing  Post-op Pain: mild  Post-op Assessment: Post-op Vital signs reviewed  Post-op Vital Signs: Reviewed  Complications: No apparent anesthesia complications

## 2012-12-14 NOTE — Anesthesia Preprocedure Evaluation (Signed)
Anesthesia Evaluation  Patient identified by MRN, date of birth, ID band Patient awake    Reviewed: Allergy & Precautions, H&P , NPO status , Patient's Chart, lab work & pertinent test results  Airway Mallampati: II      Dental   Pulmonary shortness of breath and with exertion, asthma , pneumonia -, resolved, COPD COPD inhaler,    + decreased breath sounds      Cardiovascular hypertension, + angina + CAD Rhythm:Regular Rate:Normal     Neuro/Psych  Headaches, Anxiety    GI/Hepatic Neg liver ROS, hiatal hernia, GERD-  ,  Endo/Other  diabetes  Renal/GU Renal disease     Musculoskeletal  (+) Fibromyalgia -  Abdominal   Peds  Hematology   Anesthesia Other Findings   Reproductive/Obstetrics                           Anesthesia Physical Anesthesia Plan  ASA: IV  Anesthesia Plan: General   Post-op Pain Management:    Induction: Intravenous  Airway Management Planned: Oral ETT  Additional Equipment:   Intra-op Plan:   Post-operative Plan: Possible Post-op intubation/ventilation  Informed Consent: I have reviewed the patients History and Physical, chart, labs and discussed the procedure including the risks, benefits and alternatives for the proposed anesthesia with the patient or authorized representative who has indicated his/her understanding and acceptance.   Dental advisory given  Plan Discussed with: CRNA, Anesthesiologist and Surgeon  Anesthesia Plan Comments:         Anesthesia Quick Evaluation

## 2012-12-14 NOTE — Preoperative (Signed)
Beta Blockers   Reason not to administer Beta Blockers:Not Applicable 

## 2012-12-14 NOTE — H&P (Signed)
[name] is an 67 y.o. female.   Chief Complaint:   Severe left shoulder pain with known displace proximal humerus fracture HPI:   67 yo female who fell well over a week ago injuring her left shoulder.  X-rays confirm a left proximal humerus fracture with significant displacement.  She is a very active individual and wishes to have the fracture repaired.  She understands that given her smoking history this may be difficult on her.  Th goals of surgery are fracture reduction to help improve shoulder strength and function.  The risks are infection, AVN, bleeding, and respirtatory issues due to her tobacco abuse.  Past Medical History  Diagnosis Date  . Asthma   . Cholelithiasis   . Colon polyps   . Coronary artery disease     The pt had stenting of the midsegment of the right coronary artery in 2005 with the Cypher stent. The last catheterization demonstrated an EF of 60%. The right coronary artery stent was patent. The left  main was normal. Circumflex had luminal irregularities. The LAD had luminal irregularities.   . Diabetes mellitus     Type II  . GERD (gastroesophageal reflux disease)   . Hyperlipidemia   . Lung cancer 2010    Adenocarcinoma, right lung, node positive  . Tobacco abuse   . Bronchitis   . Shortness of breath     with ambulation  . Arthritis   . Kidney stones     hx of  . Urgency of urination   . COPD (chronic obstructive pulmonary disease)     USES O2 AT NIGHT + PRN IN DAYTIME3L  . Anginal pain     PATIENT STATES DR. HOCHREIN STATED WAS FINE ON EXAM  . Emphysema   . Pneumonia   . H/O hiatal hernia   . Headache   . Fibromyalgia     Past Surgical History  Procedure Laterality Date  . Appendectomy  1970  . Gallbladder surgery  1989  . Abdominal hysterectomy  1972  . Tonsillectomy  1966  . Carpal tunnel release    . Replacement total knee  2001    left  . Neck surgery  2005  . Pneumonectomy      Right Partial, lung cancer confirmed, nod positive  .  Right upper lobectomy with lymph node dissection  10/12/2008    Dr Edwyna Shell  . Cardiac catheterization    . Coronary angioplasty  2005  . Portacath placement  12/12/2007  . Port-a-cath removal  05/12/2012    Procedure: REMOVAL PORT-A-CATH;  Surgeon: Loreli Slot, MD;  Location: [facility] OR;  Service: Thoracic;  Laterality: Left;  . Back surgery  2007  . Eye surgery      implants 02/18/12 (Left) 02/25/12 (right eye)  . Joint replacement    . Rotator cuff repair      RIGHT SHOULDER  2 YRS    Family History  Problem Relation Age of Onset  . Diabetes Mother   . Diabetes Father   . Heart disease    . Arthritis    . Lung disease    . Cancer    . Asthma     Social History:  reports that she has been smoking Cigarettes.  She has a 45 pack-year smoking history. She does not have any smokeless tobacco history on file. She reports that she does not drink alcohol or use illicit drugs.  Allergies:  Allergies  Allergen Reactions  . Niacin Rash  . Varenicline  Tartrate Rash    CHANTIX    No prescriptions prior to admission    Results for orders placed during the hospital encounter of 12/13/12 (from the past 48 hour(s))  SURGICAL PCR SCREEN     Status: None   Collection Time    12/13/12  9:54 AM      Result Value Range   MRSA, PCR NEGATIVE  NEGATIVE   Staphylococcus aureus NEGATIVE  NEGATIVE   Comment:            The Xpert SA Assay (FDA     approved for NASAL specimens     in patients over 69 years of age),     is one component of     a comprehensive surveillance     program.  Test performance has     been validated by [employer] for patients greater     than or equal to 18 year old.     It is not intended     to diagnose infection nor to     guide or monitor treatment.  BASIC METABOLIC PANEL     Status: Abnormal   Collection Time    12/13/12 10:09 AM      Result Value Range   Sodium 138  135 - 145 mEq/L   Potassium 5.1  3.5 - 5.1 mEq/L   Chloride 102  96 - 112 mEq/L    CO2 27  19 - 32 mEq/L   Glucose, Bld 213 (*) 70 - 99 mg/dL   BUN 13  6 - 23 mg/dL   Creatinine, Ser 1.61  0.50 - 1.10 mg/dL   Calcium 9.7  8.4 - 09.6 mg/dL   GFR calc non Af Amer 88 (*) >90 mL/min   GFR calc Af Amer >90  >90 mL/min   Comment:            The eGFR has been calculated     using the CKD EPI equation.     This calculation has not been     validated in all clinical     situations.     eGFR's persistently     <90 mL/min signify     possible Chronic Kidney Disease.  CBC     Status: Abnormal   Collection Time    12/13/12 10:10 AM      Result Value Range   WBC 9.2  4.0 - 10.5 K/uL   RBC 5.49 (*) 3.87 - 5.11 MIL/uL   Hemoglobin 15.4 (*) 12.0 - 15.0 g/dL   HCT 04.5  40.9 - 81.1 %   MCV 82.9  78.0 - 100.0 fL   MCH 28.1  26.0 - 34.0 pg   MCHC 33.8  30.0 - 36.0 g/dL   RDW 91.4  78.2 - 95.6 %   Platelets 159  150 - 400 K/uL   Dg Chest 2 View  12/13/2012  *RADIOLOGY REPORT*  Clinical Data: Follow-up pneumonia.  Emphysema, shortness of breath.  CHEST - 2 VIEW  Comparison: 11/27/2012  Findings: Improving opacity in the left lower lung.  Mild residual airspace disease is similar to more remote study from 08/31/2012, likely residual scarring.  Scarring in the right lung base. Postoperative changes on the right.  Heart is normal size.  No effusions or acute bony abnormality.  Soft tissue opacity noted in the right suprahilar region, likely reflects post-treatment changes.  IMPRESSION: Improving left lower lobe opacity.  Stable bibasilar scarring.  Postoperative changes on the right.  Original Report Authenticated By: Charlett Nose, M.D.     ROS  There were no vitals taken for this visit. Physical Exam  Constitutional: She is oriented to person, place, and time. She appears well-developed and well-nourished.  HENT:  Head: Normocephalic and atraumatic.  Eyes: EOM are normal. Pupils are equal, round, and reactive to light.  Neck: Normal range of motion. Neck supple.  Cardiovascular:  Normal rate and regular rhythm.   Respiratory: Effort normal. She has wheezes.  GI: Soft. Bowel sounds are normal.  Musculoskeletal:       Left shoulder: She exhibits decreased range of motion, bony tenderness, swelling, deformity and decreased strength.  Neurological: She is alert and oriented to person, place, and time.  Skin: Skin is warm and dry.  Psychiatric: She has a normal mood and affect.     Assessment/Plan Left shoulder with 3-part proximal humerus fracture with significant displacement 1) To the OR today for open reduction/internal fixation of her complex left proximal humerus fracture  Darion Juhasz Y 12/14/2012, 8:08 AM

## 2012-12-14 NOTE — Anesthesia Procedure Notes (Signed)
Procedure Name: Intubation Date/Time: 12/14/2012 3:57 PM Performed by: Orvilla Fus A Pre-anesthesia Checklist: Patient identified, Emergency Drugs available, Suction available, Patient being monitored and Timeout performed Patient Re-evaluated:Patient Re-evaluated prior to inductionOxygen Delivery Method: Circle system utilized Preoxygenation: Pre-oxygenation with 100% oxygen Intubation Type: IV induction Ventilation: Mask ventilation without difficulty and Oral airway inserted - appropriate to patient size Laryngoscope Size: Mac and 3 Grade View: Grade I Tube type: Oral Tube size: 7.5 mm Number of attempts: 1 Airway Equipment and Method: Stylet and LTA kit utilized Placement Confirmation: ETT inserted through vocal cords under direct vision,  breath sounds checked- equal and bilateral and positive ETCO2 Secured at: 22 cm Tube secured with: Tape Dental Injury: Teeth and Oropharynx as per pre-operative assessment

## 2012-12-15 ENCOUNTER — Encounter (HOSPITAL_COMMUNITY): Payer: Self-pay | Admitting: Orthopedic Surgery

## 2012-12-15 LAB — GLUCOSE, CAPILLARY
Glucose-Capillary: 176 mg/dL — ABNORMAL HIGH (ref 70–99)
Glucose-Capillary: 201 mg/dL — ABNORMAL HIGH (ref 70–99)

## 2012-12-15 MED ORDER — OXYCODONE-ACETAMINOPHEN 5-325 MG PO TABS: 1.0000 | ORAL_TABLET | Freq: Four times a day (QID) | ORAL | Status: DC | PRN

## 2012-12-15 NOTE — Progress Notes (Signed)
Subjective: 1 Day Post-Op Procedure(s) (LRB): OPEN REDUCTION INTERNAL FIXATION (ORIF) LEFT PROXIMAL HUMERUS FRACTURE (Left) Patient reports pain as mild.    Objective: Vital signs in last 24 hours: Temp:  [97.8 F (36.6 C)-98.7 F (37.1 C)] 97.8 F (36.6 C) (03/12 0700) Pulse Rate:  [96-118] 102 (03/12 0700) Resp:  [16-22] 18 (03/11 2300) BP: (125-171)/(52-89) 125/53 mmHg (03/12 0700) SpO2:  [92 %-99 %] 99 % (03/12 0700) Weight:  [93.441 kg (206 lb)] 93.441 kg (206 lb) (03/12 0454)  Intake/Output from previous day: 03/11 0701 - 03/12 0700 In: 1570 [P.O.:100; I.V.:1220; IV Piggyback:250] Out: 75 [Blood:75] Intake/Output this shift:     Recent Labs  12/13/12 1010  HGB 15.4*    Recent Labs  12/13/12 1010  WBC 9.2  RBC 5.49*  HCT 45.5  PLT 159    Recent Labs  12/13/12 1009  NA 138  K 5.1  CL 102  CO2 27  BUN 13  CREATININE 0.70  GLUCOSE 213*  CALCIUM 9.7   No results found for this basename: LABPT, INR,  in the last 72 hours  Sensation intact distally Intact pulses distally Incision: scant drainage No cellulitis present  Assessment/Plan: 1 Day Post-Op Procedure(s) (LRB): OPEN REDUCTION INTERNAL FIXATION (ORIF) LEFT PROXIMAL HUMERUS FRACTURE (Left) Discharge to home today.  BLACKMAN,CHRISTOPHER Y 12/15/2012, 7:29 AM

## 2012-12-15 NOTE — Op Note (Signed)
NAMESINCLAIR, ARRAZOLA NO.:  [id number]  MEDICAL RECORD NO.:  [id number]  LOCATION:  [id number]                        FACILITY:  [facility]  PHYSICIAN:  Vanita Panda. Magnus Ivan, M.D.DATE OF BIRTH:  [birth year]  DATE OF PROCEDURE:  12/14/2012 DATE OF DISCHARGE:                              OPERATIVE REPORT   PREOPERATIVE DIAGNOSIS:  Left 3-part proximal humerus fracture with large greater tuberosity piece.  POSTOPERATIVE DIAGNOSIS:  Left 3-part proximal humerus fracture with large greater tuberosity piece.  PROCEDURE:  Open reduction and internal fixation of left complex proximal humerus fracture.  IMPLANTS:  Biomet periarticular locking plate and screws.  SURGEON:  Vanita Panda. Magnus Ivan, MD  ASSISTANT:  Rexene Edison PA-C.  ANESTHESIA:  General.  BLOOD LOSS:  Less than 100 mL.  COMPLICATIONS:  None.  INDICATIONS:  Ms. Senk is a 67 year old female, who has a long history of chronic tobacco abuse with COPD, who had a mechanical fall and sustained a three-part proximal humerus fracture with a large greater tuberosity piece that was displaced.  She was having severe pain with her shoulder and this has greatly affected her activities of daily living.  She wished to proceed with surgical fixation of her proximal humerus fracture.  The risks and benefits of surgery were explained to her in detail.  She understands the risks, associated with her smoking as well.  PROCEDURE DESCRIPTION:  After informed consent was obtained, appropriate left shoulder was marked.  She was brought to the operating room, placed supine on the operating table.  General anesthesia was then obtained. She was then fashioned in a beach chair position with appropriate bending at the waist and knees, padding of the down on operative right arm.  Her left arm was prepped and draped with DuraPrep and sterile drapes including sterile stockinette.  We then prepped out the fluoroscopic unit as well as  we positioned this for to assess with fracture reduction and imaging.  Then, a time-out was called and she was identified as correct patient, correct left shoulder.  We then took a deltopectoral approach to the shoulder making an incision with a knife to the skin.  We dissected down to the fracture itself protecting all the vessels.  We then found a significant large greater tuberosity piece in a split of the head.  I put FiberWire sutures, and laterally and medially and to capture greater tuberosity piece.  We then mobilized the fracture in a reduced position as possible.  We chose a short Biomet plate and fashioned this along the just lateral to the bicipital groove and tendon to capture the fracture.  We secured this with bicortical screws distally and locking pegs into the humeral head.  I then ran FiberWire sutures through the plate and place further FiberWire sutures in the rotator cuff to secure it back down to the plate.  I then felt with a greater tuberosity piece being as large it was and I did not capture it as closely as I would like to.  I placed a single partially threaded 3.5 mm screw with a washer to tighten this down.  We then copiously irrigated the soft tissues with normal saline solution.  We closed  the deep tissue over the plate with 0 Vicryl followed by 2-0 Vicryl in subcutaneous tissue, and 3-0 nylon closure at the skin interrupted.  Well-padded sterile dressing was applied.  Her arm was placed back in a sling.  She was awakened, extubated, and taken to recovery room in stable condition.  All final counts were correct. There were no complications noted.  Of note, Rexene Edison, PA-C, assisted me from the entirety of the case to the patient positioning, to exposure of the fracture, to assisting with reduction of the fracture and closure of the wound.  His assistance was integral in getting as optimal outcome in the OR as possible.     Vanita Panda. Magnus Ivan,  M.D.     CYB/MEDQ  D:  12/14/2012  T:  12/15/2012  Job:  [id number]

## 2012-12-15 NOTE — Discharge Summary (Signed)
Patient ID: [name] MRN: [id number] DOB/AGE: 1946/03/30 67 y.o.  Admit date: 12/14/2012 Discharge date: 12/15/2012  Admission Diagnoses:  Principal Problem:   Closed 3-part fracture of proximal humerus   Discharge Diagnoses:  Same  Past Medical History  Diagnosis Date  . Asthma   . Cholelithiasis   . Colon polyps   . Coronary artery disease     The pt had stenting of the midsegment of the right coronary artery in 2005 with the Cypher stent. The last catheterization demonstrated an EF of 60%. The right coronary artery stent was patent. The left  main was normal. Circumflex had luminal irregularities. The LAD had luminal irregularities.   . Diabetes mellitus     Type II  . GERD (gastroesophageal reflux disease)   . Hyperlipidemia   . Lung cancer 2010    Adenocarcinoma, right lung, node positive  . Tobacco abuse   . Bronchitis   . Shortness of breath     with ambulation  . Arthritis   . Kidney stones     hx of  . Urgency of urination   . COPD (chronic obstructive pulmonary disease)     USES O2 AT NIGHT + PRN IN DAYTIME3L  . Anginal pain     PATIENT STATES DR. HOCHREIN STATED WAS FINE ON EXAM  . Emphysema   . Pneumonia   . H/O hiatal hernia   . Headache   . Fibromyalgia     Surgeries: Procedure(s): OPEN REDUCTION INTERNAL FIXATION (ORIF) LEFT PROXIMAL HUMERUS FRACTURE on 12/14/2012   Consultants:    Discharged Condition: Improved  Hospital Course: [name] is an 67 y.o. female who was admitted 12/14/2012 for operative treatment ofClosed 3-part fracture of proximal humerus. Patient has severe unremitting pain that affects sleep, daily activities, and work/hobbies. After pre-op clearance the patient was taken to the operating room on 12/14/2012 and underwent  Procedure(s): OPEN REDUCTION INTERNAL FIXATION (ORIF) LEFT PROXIMAL HUMERUS FRACTURE.    Patient was given perioperative antibiotics: Anti-infectives   Start     Dose/Rate Route Frequency Ordered Stop    12/14/12 2230  ceFAZolin (ANCEF) IVPB 1 g/50 mL premix     1 g 100 mL/hr over 30 Minutes Intravenous Every 6 hours 12/14/12 2128 12/15/12 1629   12/14/12 0600  ceFAZolin (ANCEF) IVPB 2 g/50 mL premix     2 g 100 mL/hr over 30 Minutes Intravenous On call to O.R. 12/13/12 1417 12/14/12 1554       Patient was given sequential compression devices, early ambulation, and chemoprophylaxis to prevent DVT.  Patient benefited maximally from hospital stay and there were no complications.    Recent vital signs: Patient Vitals for the past 24 hrs:  BP Temp Temp src Pulse Resp SpO2 Height Weight  12/15/12 0700 125/53 mmHg 97.8 F (36.6 C) - 102 - 99 % - -  12/15/12 0454 - - - - - - 5' 1.5" (1.562 m) 93.441 kg (206 lb)  12/14/12 2300 145/64 mmHg 98.7 F (37.1 C) - 106 18 95 % - -  12/14/12 2037 142/62 mmHg - - - - - - -  12/14/12 2036 - 98.7 F (37.1 C) - 110 17 94 % - -  12/14/12 2030 - - - 118 20 96 % - -  12/14/12 2022 162/81 mmHg - - - - - - -  12/14/12 2011 - - - 109 18 95 % - -  12/14/12 2007 168/89 mmHg - - - - - - -  12/14/12  2000 - - - 113 16 94 % - -  12/14/12 1952 156/78 mmHg - - - - - - -  12/14/12 1944 - - - 106 18 96 % - -  12/14/12 1937 160/79 mmHg - - - - - - -  12/14/12 1930 - - - 108 19 92 % - -  12/14/12 1922 171/70 mmHg - - - - - - -  12/14/12 1915 - - - 102 18 93 % - -  12/14/12 1907 166/77 mmHg - - - - - - -  12/14/12 1900 - - - 107 20 96 % - -  12/14/12 1852 157/65 mmHg - - - - - - -  12/14/12 1844 - - - 96 19 93 % - -  12/14/12 1837 153/52 mmHg - - - - - - -  12/14/12 1830 - - - 101 22 93 % - -  12/14/12 1822 160/69 mmHg - - - - - - -  12/14/12 1820 - 97.8 F (36.6 C) - - - - - -  12/14/12 1319 129/75 mmHg 98.3 F (36.8 C) Oral 98 - - - -     Recent laboratory studies:  Recent Labs  12/13/12 1009 12/13/12 1010  WBC  --  9.2  HGB  --  15.4*  HCT  --  45.5  PLT  --  159  NA 138  --   K 5.1  --   CL 102  --   CO2 27  --   BUN 13  --   CREATININE  0.70  --   GLUCOSE 213*  --   CALCIUM 9.7  --      Discharge Medications:     Medication List    TAKE these medications       albuterol-ipratropium 18-103 MCG/ACT inhaler  Commonly known as:  COMBIVENT  Inhale 2 puffs into the lungs as needed for wheezing or shortness of breath.     doxycycline 100 MG capsule  Commonly known as:  VIBRAMYCIN  Take 1 capsule (100 mg total) by mouth 2 (two) times daily.     esomeprazole 40 MG capsule  Commonly known as:  NEXIUM  Take 40 mg by mouth 2 (two) times daily as needed (for heartburm/ acid reflux).     fexofenadine-pseudoephedrine 180-240 MG per 24 hr tablet  Commonly known as:  ALLEGRA-D 24  Take 1 tablet by mouth every evening.     ibuprofen 200 MG tablet  Commonly known as:  ADVIL,MOTRIN  Take 400 mg by mouth every 6 (six) hours as needed for pain.     insulin aspart 100 UNIT/ML injection  Commonly known as:  novoLOG  Inject 10-20 Units into the skin 3 (three) times daily before meals. Units given depends on her CBG readings     insulin glargine 100 UNIT/ML injection  Commonly known as:  LANTUS  Inject 50 Units into the skin at bedtime.     ipratropium 0.02 % nebulizer solution  Commonly known as:  ATROVENT  Take 500 mcg by nebulization every 6 (six) hours as needed for wheezing.     oxyCODONE-acetaminophen 5-325 MG per tablet  Commonly known as:  ROXICET  Take 1-2 tablets by mouth every 6 (six) hours as needed for pain.     PROAIR HFA 108 (90 BASE) MCG/ACT inhaler  Generic drug:  albuterol  Inhale 1-2 puffs into the lungs every 6 (six) hours as needed. For shortness of breath/wheezing     SYSTANE 0.4-0.3 %  Soln  Generic drug:  Polyethyl Glycol-Propyl Glycol  Apply 1-2 drops to eye daily as needed (for dry eye relief).     Vitamin D 1000 UNITS capsule  Take 1,000 Units by mouth every evening.        Diagnostic Studies: Dg Chest 1 View  11/27/2012  *RADIOLOGY REPORT*  Clinical Data: Fall.  Pain.  CHEST - 1 VIEW   Comparison: 08/31/2012  Findings: There is chronic pulmonary scarring.  Markings appear more accentuated in the left lower lobe.  There could be active atelectasis or pneumonia in that region.  No pneumothorax.  No effusion.  No fracture of the central chest structures.  IMPRESSION: Chronic pulmonary scarring.  Markings appear more prominent in the left lower lobe.  This raises the possibility of atelectasis or pneumonia in the left lower lobe.   Original Report Authenticated By: Paulina Fusi, M.D.    Dg Chest 2 View  12/13/2012  *RADIOLOGY REPORT*  Clinical Data: Follow-up pneumonia.  Emphysema, shortness of breath.  CHEST - 2 VIEW  Comparison: 11/27/2012  Findings: Improving opacity in the left lower lung.  Mild residual airspace disease is similar to more remote study from 08/31/2012, likely residual scarring.  Scarring in the right lung base. Postoperative changes on the right.  Heart is normal size.  No effusions or acute bony abnormality.  Soft tissue opacity noted in the right suprahilar region, likely reflects post-treatment changes.  IMPRESSION: Improving left lower lobe opacity.  Stable bibasilar scarring.  Postoperative changes on the right.   Original Report Authenticated By: Charlett Nose, M.D.    Dg Elbow 2 Views Left  11/27/2012  *RADIOLOGY REPORT*  Clinical Data: Pain.  Fall.  LEFT ELBOW - 2 VIEW  Comparison: None  Findings:  Two-view exam with atypical positioning does not show any evidence of fracture or dislocation.  IMPRESSION: Negative atypically positioned two-view exam   Original Report Authenticated By: Paulina Fusi, M.D.    Ct Head Wo Contrast  11/27/2012  *RADIOLOGY REPORT*  Clinical Data: The patient fell while bowling.  Altered level of consciousness.  CT HEAD WITHOUT CONTRAST  Technique:  Contiguous axial images were obtained from the base of the skull through the vertex without contrast.  Comparison: 10/13/2012  Findings: The ventricles and sulci are symmetrical without significant  effacement, displacement, or dilatation. No mass effect or midline shift. No abnormal extra-axial fluid collections. The grey-white matter junction is distinct. Basal cisterns are not effaced. No acute intracranial hemorrhage. No depressed skull fractures.  Vascular calcifications.  Opacification of ethmoid air cells and mucous membrane thickening in the right maxillary antrum, likely due to inflammatory change.  IMPRESSION: No acute intracranial abnormalities.   Original Report Authenticated By: Burman Nieves, M.D.    Dg Shoulder Left  12/14/2012  *RADIOLOGY REPORT*  Clinical Data: ORIF of left humerus.  LEFT SHOULDER - 2+ VIEW  Comparison: 11/27/2012  Findings: Plate and screw fixation of the previously described comminuted humeral neck fracture.  Alignment is anatomic, and no acute hardware complications are identified.  IMPRESSION: Internal fixation of the proximal humerus.   Original Report Authenticated By: Jeronimo Greaves, M.D.    Dg Shoulder Left  11/27/2012  *RADIOLOGY REPORT*  Clinical Data: Severe left shoulder pain following a fall today.  LEFT SHOULDER - 2+ VIEW  Comparison: None.  Findings: Mildly impacted and comminuted left humeral neck and greater tuberosity fracture.  Mild anterior/medial displacement and lateral/posterior angulation of the distal fragment.  IMPRESSION: Comminuted humeral neck and greater tuberosity fracture, as  described above.   Original Report Authenticated By: Beckie Salts, M.D.    Dg Humerus Left  11/27/2012  *RADIOLOGY REPORT*  Clinical Data: Fall.  Pain.  LEFT HUMERUS - 2+ VIEW  Comparison: Same day  Findings: Humeral neck fracture as described at the shoulder radiography study.  No fracture distal to that.  IMPRESSION: Humeral neck fracture.  See shoulder exam.  No fracture distal to that.   Original Report Authenticated By: Paulina Fusi, M.D.    Dg C-arm 61-120 Min-no Report  12/14/2012  CLINICAL DATA: ORIF L Humerus   C-ARM 61-120 MINUTES  Fluoroscopy was utilized  by the requesting physician.  No radiographic  interpretation.      Disposition: 01-Home or Self Care      Discharge Orders   Future Appointments Provider Department Dept Phone   04/12/2013 10:00 AM Mauri [place] [facility] MEDICAL ONCOLOGY [phone]   04/12/2013 11:00 AM [facility] [phone]   Patient to arrive 15 minutes prior to appointment time. No solid food 4 hours prior to exam. Liquids and Medicines are okay.   04/14/2013 10:45 AM Si Gaul, MD [facility] MEDICAL ONCOLOGY [phone]   Future Orders Complete By Expires     Call MD / Call 911  As directed     Comments:      If you experience chest pain or shortness of breath, CALL 911 and be transported to the hospital emergency room.  If you develope a fever above 101 F, pus (white drainage) or increased drainage or redness at the wound, or calf pain, call your surgeon's office.    Constipation Prevention  As directed     Comments:      Drink plenty of fluids.  Prune juice may be helpful.  You may use a stool softener, such as Colace (over the counter) 100 mg twice a day.  Use MiraLax (over the counter) for constipation as needed.    Diet - low sodium heart healthy  As directed     Discharge instructions  As directed     Comments:      Ice as needed for left shoulder swelling. Expect some bloody drainage. Wait 4 days before getting your incision wet in the shower; then dry dressing daily. Wear your sling at most times including when you sleep. Do not try to lift your left arm above your head.    Discharge patient  As directed     Increase activity slowly as tolerated  As directed        Follow-up Information   Follow up with Kathryne Hitch, MD In 2 weeks.   Contact information:   [address] [address] [place] [address] [phone]        Signed: Kathryne Hitch 12/15/2012, 7:32 AM

## 2012-12-15 NOTE — Progress Notes (Signed)
Patient discharged in stable condition via wheelchair home. Discharge instructions and prescriptions were given and explained.

## 2012-12-18 ENCOUNTER — Encounter (HOSPITAL_COMMUNITY): Payer: Self-pay | Admitting: Orthopaedic Surgery

## 2013-01-04 NOTE — Telephone Encounter (Signed)
No note from doctor °

## 2013-03-11 ENCOUNTER — Other Ambulatory Visit (HOSPITAL_COMMUNITY): Payer: Self-pay | Admitting: Family Medicine

## 2013-03-11 DIAGNOSIS — M810 Age-related osteoporosis without current pathological fracture: Secondary | ICD-10-CM

## 2013-03-23 ENCOUNTER — Other Ambulatory Visit (HOSPITAL_COMMUNITY): Payer: Medicare Other

## 2013-04-12 ENCOUNTER — Other Ambulatory Visit: Payer: Self-pay | Admitting: Medical Oncology

## 2013-04-12 ENCOUNTER — Ambulatory Visit (HOSPITAL_COMMUNITY): Admission: RE | Admit: 2013-04-12 | Payer: Medicare Other | Source: Ambulatory Visit

## 2013-04-12 ENCOUNTER — Other Ambulatory Visit: Payer: Medicare Other | Admitting: Lab

## 2013-04-13 ENCOUNTER — Telehealth: Payer: Self-pay | Admitting: Internal Medicine

## 2013-04-13 NOTE — Telephone Encounter (Signed)
s.w. pt and advised on lab, ct and est changes...pt ok and aware

## 2013-04-14 ENCOUNTER — Ambulatory Visit: Payer: Medicare Other | Admitting: Internal Medicine

## 2013-04-14 ENCOUNTER — Other Ambulatory Visit: Payer: Medicare Other | Admitting: Lab

## 2013-04-21 ENCOUNTER — Ambulatory Visit (HOSPITAL_COMMUNITY)
Admission: RE | Admit: 2013-04-21 | Discharge: 2013-04-21 | Disposition: A | Discharge status: Home / Self Care | Payer: Medicare Other | Source: Ambulatory Visit | Attending: Internal Medicine | Admitting: Internal Medicine

## 2013-04-21 ENCOUNTER — Encounter (HOSPITAL_COMMUNITY): Payer: Self-pay

## 2013-04-21 ENCOUNTER — Other Ambulatory Visit (HOSPITAL_BASED_OUTPATIENT_CLINIC_OR_DEPARTMENT_OTHER): Payer: Medicare Other | Admitting: Lab

## 2013-04-21 DIAGNOSIS — J479 Bronchiectasis, uncomplicated: Secondary | ICD-10-CM | POA: Insufficient documentation

## 2013-04-21 DIAGNOSIS — C3490 Malignant neoplasm of unspecified part of unspecified bronchus or lung: Secondary | ICD-10-CM | POA: Insufficient documentation

## 2013-04-21 DIAGNOSIS — K2289 Other specified disease of esophagus: Secondary | ICD-10-CM | POA: Insufficient documentation

## 2013-04-21 DIAGNOSIS — Z9221 Personal history of antineoplastic chemotherapy: Secondary | ICD-10-CM | POA: Insufficient documentation

## 2013-04-21 DIAGNOSIS — I700 Atherosclerosis of aorta: Secondary | ICD-10-CM | POA: Insufficient documentation

## 2013-04-21 DIAGNOSIS — Z923 Personal history of irradiation: Secondary | ICD-10-CM | POA: Insufficient documentation

## 2013-04-21 DIAGNOSIS — K228 Other specified diseases of esophagus: Secondary | ICD-10-CM | POA: Insufficient documentation

## 2013-04-21 LAB — COMPREHENSIVE METABOLIC PANEL (CC13)
Albumin: 3.7 g/dL (ref 3.5–5.0)
Alkaline Phosphatase: 117 U/L (ref 40–150)
BUN: 14.3 mg/dL (ref 7.0–26.0)
Glucose: 208 mg/dl — ABNORMAL HIGH (ref 70–140)
Total Bilirubin: 0.64 mg/dL (ref 0.20–1.20)

## 2013-04-21 LAB — CBC WITH DIFFERENTIAL/PLATELET
Basophils Absolute: 0.1 10*3/uL (ref 0.0–0.1)
Eosinophils Absolute: 0.2 10*3/uL (ref 0.0–0.5)
HGB: 15 g/dL (ref 11.6–15.9)
MCV: 80.9 fL (ref 79.5–101.0)
MONO#: 0.5 10*3/uL (ref 0.1–0.9)
MONO%: 7.9 % (ref 0.0–14.0)
NEUT#: 4 10*3/uL (ref 1.5–6.5)
RDW: 15.4 % — ABNORMAL HIGH (ref 11.2–14.5)
WBC: 6.4 10*3/uL (ref 3.9–10.3)

## 2013-04-21 MED ORDER — IOHEXOL 300 MG/ML  SOLN
80.0000 mL | Freq: Once | INTRAMUSCULAR | Status: AC | PRN
  Administered 2013-04-21: 80 mL via INTRAVENOUS

## 2013-04-27 ENCOUNTER — Ambulatory Visit: Payer: Medicare Other | Admitting: Internal Medicine

## 2013-05-03 ENCOUNTER — Telehealth: Payer: Self-pay | Admitting: Internal Medicine

## 2013-05-03 NOTE — Telephone Encounter (Signed)
returned pt call and r/s appt...pt ok and aware  Done

## 2013-05-05 ENCOUNTER — Telehealth: Payer: Self-pay | Admitting: Internal Medicine

## 2013-05-05 ENCOUNTER — Encounter: Payer: Self-pay | Admitting: Internal Medicine

## 2013-05-05 ENCOUNTER — Ambulatory Visit (HOSPITAL_BASED_OUTPATIENT_CLINIC_OR_DEPARTMENT_OTHER): Payer: Medicare Other | Admitting: Internal Medicine

## 2013-05-05 VITALS — BP 129/61 | HR 97 | Temp 97.9°F | Resp 18 | Ht 61.5 in | Wt 205.6 lb

## 2013-05-05 DIAGNOSIS — C3490 Malignant neoplasm of unspecified part of unspecified bronchus or lung: Secondary | ICD-10-CM

## 2013-05-05 NOTE — Progress Notes (Signed)
[facility] [phone]   Fax:([phone]  OFFICE PROGRESS NOTE  Rita Reichert, MD [address] [place] [address]  DIAGNOSIS: Stage IIIA (T2a N2 MX) non-small cell lung cancer, adenocarcinoma, diagnosed in October 2009.   PRIOR THERAPY: :  1. Status post right upper lobectomy with lymph node dissection under the care of Dr. Edwyna Shell on October 12, 2008. 2. Status post 4 cycles of adjuvant chemotherapy with cisplatin and docetaxel given every 3 weeks with Neulasta support. Last dose was given on January 23, 2009. 3. Status post adjuvant radiotherapy to the mediastinum. The patient received a total dose of 5040 cGy between March 06, 2009, through April 07, 2009, under the care of Dr. Michell Heinrich.  CURRENT THERAPY: Observation.   CHEMOTHERAPY INTENT: curative  CURRENT # OF CHEMOTHERAPY CYCLES: 0  CURRENT ANTIEMETICS: Compazine  CURRENT SMOKING STATUS: Current smoker. I strongly advise her to quit smoking and offered her smoke cessation program. She was also given him the phone number for the quick now program  ORAL CHEMOTHERAPY AND CONSENT: None  CURRENT BISPHOSPHONATES USE: None  PAIN MANAGEMENT: No Pain.  NARCOTICS INDUCED CONSTIPATION: N/A  LIVING WILL AND CODE STATUS: no CODE BLUE   INTERVAL HISTORY: [name] 67 y.o. female returns to the clinic today for follow up visit accompanied her daughter. The patient is feeling fine today with no specific complaints except for mild cough as well as shortness breath with exertion. Unfortunately she continues to smoke. The patient has repeat CT scan of the chest performed recently and she is here for evaluation and discussion of her scan results.  MEDICAL HISTORY: Past Medical History  Diagnosis Date  . Asthma   . Cholelithiasis   . Colon polyps   . Coronary artery disease     The pt had stenting of the midsegment of the right coronary artery in 2005 with the Cypher stent. The last  catheterization demonstrated an EF of 60%. The right coronary artery stent was patent. The left  main was normal. Circumflex had luminal irregularities. The LAD had luminal irregularities.   . Diabetes mellitus     Type II  . GERD (gastroesophageal reflux disease)   . Hyperlipidemia   . Lung cancer 2010    Adenocarcinoma, right lung, node positive  . Tobacco abuse   . Bronchitis   . Shortness of breath     with ambulation  . Arthritis   . Kidney stones     hx of  . Urgency of urination   . COPD (chronic obstructive pulmonary disease)     USES O2 AT NIGHT + PRN IN DAYTIME3L  . Anginal pain     PATIENT STATES DR. HOCHREIN STATED WAS FINE ON EXAM  . Emphysema   . Pneumonia   . H/O hiatal hernia   . Headache(784.0)   . Fibromyalgia     ALLERGIES:  is allergic to niacin and varenicline tartrate.  MEDICATIONS:  Current Outpatient Prescriptions  Medication Sig Dispense Refill  . albuterol-ipratropium (COMBIVENT) 18-103 MCG/ACT inhaler Inhale 2 puffs into the lungs as needed for wheezing or shortness of breath.       . Cholecalciferol (VITAMIN D) 1000 UNITS capsule Take 1,000 Units by mouth every evening.       [name] esomeprazole (NEXIUM) 40 MG capsule Take 40 mg by mouth 2 (two) times daily as needed (for heartburm/ acid reflux).      . fexofenadine-pseudoephedrine (ALLEGRA-D 24) 180-240 MG per 24 hr  tablet Take 1 tablet by mouth every evening.      [name] ibuprofen (ADVIL,MOTRIN) 200 MG tablet Take 400 mg by mouth every 6 (six) hours as needed for pain.      [name] insulin aspart (NOVOLOG) 100 UNIT/ML injection Inject 10-20 Units into the skin 3 (three) times daily before meals. Units given depends on her CBG readings      . insulin glargine (LANTUS) 100 UNIT/ML injection Inject 50 Units into the skin at bedtime.       [name] ipratropium (ATROVENT) 0.02 % nebulizer solution Take 500 mcg by nebulization every 6 (six) hours as needed for wheezing.       Bertram Gala Glycol-Propyl Glycol (SYSTANE) 0.4-0.3  % SOLN Apply 1-2 drops to eye daily as needed (for dry eye relief).      [name] PROAIR HFA 108 (90 BASE) MCG/ACT inhaler Inhale 1-2 puffs into the lungs every 6 (six) hours as needed. For shortness of breath/wheezing      . oxyCODONE-acetaminophen (ROXICET) 5-325 MG per tablet Take 1-2 tablets by mouth every 6 (six) hours as needed for pain.  60 tablet  0   No current facility-administered medications for this visit.    SURGICAL HISTORY:  Past Surgical History  Procedure Laterality Date  . Appendectomy  1970  . Gallbladder surgery  1989  . Abdominal hysterectomy  1972  . Tonsillectomy  1966  . Carpal tunnel release    . Replacement total knee  2001    left  . Neck surgery  2005  . Pneumonectomy      Right Partial, lung cancer confirmed, nod positive  . Right upper lobectomy with lymph node dissection  10/12/2008    Dr Edwyna Shell  . Cardiac catheterization    . Coronary angioplasty  2005  . Portacath placement  12/12/2007  . Port-a-cath removal  05/12/2012    Procedure: REMOVAL PORT-A-CATH;  Surgeon: Loreli Slot, MD;  Location: [facility] OR;  Service: Thoracic;  Laterality: Left;  . Back surgery  2007  . Eye surgery      implants 02/18/12 (Left) 02/25/12 (right eye)  . Joint replacement    . Rotator cuff repair      RIGHT SHOULDER  2 YRS  . Orif humerus fracture Left 12/14/2012    Procedure: OPEN REDUCTION INTERNAL FIXATION (ORIF) LEFT PROXIMAL HUMERUS FRACTURE;  Surgeon: Kathryne Hitch, MD;  Location: [facility] OR;  Service: Orthopedics;  Laterality: Left;    REVIEW OF SYSTEMS:  A comprehensive review of systems was negative except for: Respiratory: positive for dyspnea on exertion   PHYSICAL EXAMINATION: General appearance: alert, cooperative and no distress Head: Normocephalic, without obvious abnormality, atraumatic Neck: no adenopathy Lymph nodes: Cervical, supraclavicular, and axillary nodes normal. Resp: clear to auscultation bilaterally Cardio: regular rate and rhythm, S1, S2  normal, no murmur, click, rub or gallop GI: soft, non-tender; bowel sounds normal; no masses,  no organomegaly Extremities: extremities normal, atraumatic, no cyanosis or edema  ECOG PERFORMANCE STATUS: 1 - Symptomatic but completely ambulatory  Blood pressure 129/61, pulse 97, temperature 97.9 F (36.6 C), temperature source Oral, resp. rate 18, height 5' 1.5" (1.562 m), weight 205 lb 9.6 oz (93.26 kg).  LABORATORY DATA: Lab Results  Component Value Date   WBC 6.4 04/21/2013   HGB 15.0 04/21/2013   HCT 44.3 04/21/2013   MCV 80.9 04/21/2013   PLT 142* 04/21/2013      Chemistry      Component Value Date/Time   NA 140 04/21/2013 0900  NA 138 12/13/2012 1009   NA 139 03/23/2012 0913   K 5.2* 04/21/2013 0900   K 5.1 12/13/2012 1009   K 5.1* 03/23/2012 0913   CL 102 12/13/2012 1009   CL 106 10/08/2012 0958   CL 100 03/23/2012 0913   CO2 26 04/21/2013 0900   CO2 27 12/13/2012 1009   CO2 30 03/23/2012 0913   BUN 14.3 04/21/2013 0900   BUN 13 12/13/2012 1009   BUN 11 03/23/2012 0913   CREATININE 0.9 04/21/2013 0900   CREATININE 0.70 12/13/2012 1009   CREATININE 0.9 03/23/2012 0913      Component Value Date/Time   CALCIUM 9.4 04/21/2013 0900   CALCIUM 9.7 12/13/2012 1009   CALCIUM 8.8 03/23/2012 0913   ALKPHOS 117 04/21/2013 0900   ALKPHOS 106 05/10/2012 1242   ALKPHOS 98* 03/23/2012 0913   AST 15 04/21/2013 0900   AST 13 05/10/2012 1242   AST 15 03/23/2012 0913   ALT 12 04/21/2013 0900   ALT 9 05/10/2012 1242   ALT 20 03/23/2012 0913   BILITOT 0.64 04/21/2013 0900   BILITOT 0.3 05/10/2012 1242   BILITOT 0.70 03/23/2012 0913       RADIOGRAPHIC STUDIES: Ct Chest W Contrast  04/21/2013   *RADIOLOGY REPORT*  Clinical Data: Lung cancer diagnosed in 2010 status post right upper lobectomy, chemotherapy and radiation therapy.  Cough and shortness of breath.  CT CHEST WITH CONTRAST  Technique:  Multidetector CT imaging of the chest was performed following the standard protocol during bolus administration of  intravenous contrast.  Contrast: 80mL OMNIPAQUE IOHEXOL 300 MG/ML  SOLN  Comparison: Chest CT 10/08/2012 and 03/23/2012.  Findings: There are no enlarged mediastinal or hilar lymph nodes. There is stable paramediastinal scarring superiorly and chronic thickening of the walls of the distal esophagus.  There is stable mild atherosclerosis of the aorta, great vessels and coronary arteries.  No significant pleural or pericardial effusion is present.  There are stable postsurgical changes status post right upper lobe resection.  Paramediastinal scarring and bronchiectasis medially in the upper right hemithorax are stable.  There are a few scattered ill-defined ground-glass nodules in the right lung which are mostly stable.  One in the right lower lobe appears slightly more prominent, measuring 6 mm on image 17.  Mosaic appearance of the lungs attributed to small airways disease is stable.  The visualized upper abdomen appears stable status post cholecystectomy.  There are no worrisome osseous findings. The patient has undergone proximal left humeral fixation.  IMPRESSION:  1.  Similar overall appearance of the chest status post right upper lobe resection and radiation therapy.  There is stable paramediastinal scarring and bronchiectasis in the right lung. 2.  No evidence of metastatic disease. 3.  Minimal progression of a single ground-glass density in the right lower lobe.  Continued CT followup recommended. 4.  Stable distal esophageal wall thickening attributed to radiation therapy.   Original Report Authenticated By: Carey Bullocks, M.D.    ASSESSMENT AND PLAN: this is a very pleasant 67 years old white female with history of stage IIIA non-small cell lung cancer status post right upper lobectomy followed by 4 cycles of adjuvant chemotherapy and adjuvant radiotherapy to the mediastinum. She has been observation since June of 2010 with no evidence for disease recurrence. I discussed the scan results with the  patient and her daughter today. I recommended for her to continue on observation with repeat CT scan of the chest in 6 months. I strongly encouraged the patient  to quit smoking and offered her smoke cessation program as well as the number for the quit now program which offers free nicotine patches. She was advised to call immediately she has any concerning symptoms in the interval.  The patient voices understanding of current disease status and treatment options and is in agreement with the current care plan.  All questions were answered. The patient knows to call the clinic with any problems, questions or concerns. We can certainly see the patient much sooner if necessary.

## 2013-05-05 NOTE — Telephone Encounter (Signed)
gv and printed appt sched and avs for pt  °

## 2013-05-07 NOTE — Patient Instructions (Signed)
CURRENT THERAPY: Observation.  CHEMOTHERAPY INTENT: curative  CURRENT # OF CHEMOTHERAPY CYCLES: 0  CURRENT ANTIEMETICS: Compazine  CURRENT SMOKING STATUS: Current smoker. I strongly advise her to quit smoking and offered her smoke cessation program. She was also given him the phone number for the quick now program  ORAL CHEMOTHERAPY AND CONSENT: None  CURRENT BISPHOSPHONATES USE: None  PAIN MANAGEMENT: No Pain.  NARCOTICS INDUCED CONSTIPATION: N/A  LIVING WILL AND CODE STATUS: no CODE BLUE

## 2013-05-11 ENCOUNTER — Other Ambulatory Visit: Payer: Self-pay

## 2013-07-06 ENCOUNTER — Other Ambulatory Visit: Payer: Self-pay | Admitting: Neurosurgery

## 2013-07-06 DIAGNOSIS — M5136 Other intervertebral disc degeneration, lumbar region: Secondary | ICD-10-CM

## 2013-07-11 ENCOUNTER — Ambulatory Visit
Admission: RE | Admit: 2013-07-11 | Discharge: 2013-07-11 | Disposition: A | Discharge status: Home / Self Care | Payer: Medicare Other | Source: Ambulatory Visit | Attending: Neurosurgery | Admitting: Neurosurgery

## 2013-07-11 VITALS — BP 138/66 | HR 98

## 2013-07-11 DIAGNOSIS — M5136 Other intervertebral disc degeneration, lumbar region: Secondary | ICD-10-CM

## 2013-07-11 MED ORDER — ONDANSETRON HCL 4 MG/2ML IJ SOLN
4.0000 mg | Freq: Once | INTRAMUSCULAR | Status: AC
  Administered 2013-07-11: 4 mg via INTRAMUSCULAR

## 2013-07-11 MED ORDER — DIAZEPAM 5 MG PO TABS
5.0000 mg | ORAL_TABLET | Freq: Once | ORAL | Status: AC
  Administered 2013-07-11: 5 mg via ORAL

## 2013-07-11 MED ORDER — IOHEXOL 180 MG/ML  SOLN
20.0000 mL | Freq: Once | INTRAMUSCULAR | Status: AC | PRN
  Administered 2013-07-11: 20 mL via INTRATHECAL

## 2013-07-11 MED ORDER — MEPERIDINE HCL 100 MG/ML IJ SOLN
100.0000 mg | Freq: Once | INTRAMUSCULAR | Status: AC
  Administered 2013-07-11: 100 mg via INTRAMUSCULAR

## 2013-07-15 ENCOUNTER — Other Ambulatory Visit: Payer: Self-pay | Admitting: Neurosurgery

## 2013-07-21 NOTE — Pre-Procedure Instructions (Signed)
[name]  07/21/2013   Your procedure is scheduled on:  07-27-13 @ 0830  Report to [facility]  2 * 3 at 0630 AM.  Call this number if you have problems the morning of surgery: [phone]   Remember:   Do not eat food or drink liquids after midnight.   Take these medicines the morning of surgery with A SIP OF WATER: inhalers if needed, Nexium (esomeprazole), and Roxicet (Oxycodone-acetaminophen) if needed    Stop taking aspirin, aleve, BC's, Goody's, Ibuprofen, Herbal medications, Fish oil.   Do not wear jewelry, make-up or nail polish.  Do not wear lotions, powders, or perfumes. You may wear deodorant.  Do not shave 48 hours prior to surgery. .  Do not bring valuables to the hospital.  [facility] is not responsible                  for any belongings or valuables.               Contacts, dentures or bridgework may not be worn into surgery.  Leave suitcase in the car. After surgery it may be brought to your room.  For patients admitted to the hospital, discharge time is determined by your                treatment team.               Patients discharged the day of surgery will not be allowed to drive  home.    Special Instructions: Shower using CHG 2 nights before surgery and the night before surgery.  If you shower the day of surgery use CHG.  Use special wash - you have one bottle of CHG for all showers.  You should use approximately 1/3 of the bottle for each shower.   Please read over the following fact sheets that you were given: Pain Booklet, Coughing and Deep Breathing, Blood Transfusion Information, MRSA Information and Surgical Site Infection Prevention

## 2013-07-22 ENCOUNTER — Ambulatory Visit (HOSPITAL_COMMUNITY)
Admission: RE | Admit: 2013-07-22 | Discharge: 2013-07-22 | Disposition: A | Discharge status: Home / Self Care | Payer: Medicare Other | Source: Ambulatory Visit | Attending: Anesthesiology | Admitting: Anesthesiology

## 2013-07-22 ENCOUNTER — Encounter (HOSPITAL_COMMUNITY): Payer: Self-pay

## 2013-07-22 ENCOUNTER — Encounter (HOSPITAL_COMMUNITY)
Admission: RE | Admit: 2013-07-22 | Discharge: 2013-07-22 | Disposition: A | Discharge status: Home / Self Care | Payer: Medicare Other | Source: Ambulatory Visit | Attending: Neurosurgery | Admitting: Neurosurgery

## 2013-07-22 DIAGNOSIS — R9431 Abnormal electrocardiogram [ECG] [EKG]: Secondary | ICD-10-CM | POA: Insufficient documentation

## 2013-07-22 DIAGNOSIS — I4460 Unspecified fascicular block: Secondary | ICD-10-CM | POA: Insufficient documentation

## 2013-07-22 DIAGNOSIS — I498 Other specified cardiac arrhythmias: Secondary | ICD-10-CM | POA: Insufficient documentation

## 2013-07-22 DIAGNOSIS — Z01818 Encounter for other preprocedural examination: Secondary | ICD-10-CM | POA: Insufficient documentation

## 2013-07-22 DIAGNOSIS — Z981 Arthrodesis status: Secondary | ICD-10-CM | POA: Insufficient documentation

## 2013-07-22 DIAGNOSIS — Z01810 Encounter for preprocedural cardiovascular examination: Secondary | ICD-10-CM | POA: Insufficient documentation

## 2013-07-22 DIAGNOSIS — Z85118 Personal history of other malignant neoplasm of bronchus and lung: Secondary | ICD-10-CM | POA: Insufficient documentation

## 2013-07-22 DIAGNOSIS — Z01812 Encounter for preprocedural laboratory examination: Secondary | ICD-10-CM | POA: Insufficient documentation

## 2013-07-22 DIAGNOSIS — Z902 Acquired absence of lung [part of]: Secondary | ICD-10-CM | POA: Insufficient documentation

## 2013-07-22 LAB — BASIC METABOLIC PANEL
CO2: 27 mEq/L (ref 19–32)
Calcium: 9.2 mg/dL (ref 8.4–10.5)
Chloride: 101 mEq/L (ref 96–112)
Creatinine, Ser: 0.66 mg/dL (ref 0.50–1.10)
Glucose, Bld: 280 mg/dL — ABNORMAL HIGH (ref 70–99)
Potassium: 4 mEq/L (ref 3.5–5.1)
Sodium: 140 mEq/L (ref 135–145)

## 2013-07-22 LAB — CBC
Hemoglobin: 15.1 g/dL — ABNORMAL HIGH (ref 12.0–15.0)
MCH: 28.3 pg (ref 26.0–34.0)
MCV: 82.4 fL (ref 78.0–100.0)
Platelets: 160 10*3/uL (ref 150–400)
RBC: 5.33 MIL/uL — ABNORMAL HIGH (ref 3.87–5.11)
RDW: 15.3 % (ref 11.5–15.5)
WBC: 7.4 10*3/uL (ref 4.0–10.5)

## 2013-07-22 LAB — TYPE AND SCREEN

## 2013-07-22 LAB — SURGICAL PCR SCREEN
MRSA, PCR: POSITIVE — AB
Staphylococcus aureus: POSITIVE — AB

## 2013-07-22 NOTE — Progress Notes (Signed)
Pt tested positive for Staph and MRSA.  Pt called and informed and Dr Cassandria Santee office also called and informed, spoke with Darl Pikes.

## 2013-07-22 NOTE — Progress Notes (Signed)
07/22/13 1025  OBSTRUCTIVE SLEEP APNEA  Have you ever been diagnosed with sleep apnea through a sleep study? No  Do you snore loudly (loud enough to be heard through closed doors)?  0  Do you often feel tired, fatigued, or sleepy during the daytime? 1  Has anyone observed you stop breathing during your sleep? 1  Do you have, or are you being treated for high blood pressure? 0  BMI more than 35 kg/m2? 1  Age over 67 years old? 1  Neck circumference greater than 40 cm/18 inches? 0  Gender: 0  Obstructive Sleep Apnea Score 4  Score 4 or greater  Results sent to PCP

## 2013-07-22 NOTE — Progress Notes (Signed)
PCP is Dr. Renard Matter Cardiologist is Dr Wyline Mood Pt states that she had a card cath in 2005 Echo, stress test noted in epic from 07-18-11 Denies recent EKG or CXR

## 2013-07-25 ENCOUNTER — Encounter (HOSPITAL_COMMUNITY): Payer: Self-pay

## 2013-07-25 NOTE — Progress Notes (Addendum)
Anesthesia chart review: Patient is a 67 year old female scheduled for L4-5 lumbar fusion on 07/27/2013 by Dr. Jeral Fruit.  PAT visit was 07/22/13.  I was not asked to evaluate her during her visit.  History includes obesity, COPD/emphysema with on-going smoking, home O2 use of 3L/Council, stage IIIa NSC lung cancer s/p RU lobectomy and chemoradiation '10, asthma, CAD s/p RCA Cypher stent '05, DM2, HLD, hiatal hernia, GERD, fibromyalgia, prior neck and back surgery, ORIF left humerus fracture on 12/14/12. OSA screening score was 4. PCP is Dr. Butch Penny. Has seen Pulmonologist Dr. Vassie Loll in the past, but not since 2012. Oncologist is Dr. [name].   Anesthesia history: No complications, but she wanted to make sure that nothing was placed in her right nares due to narrowing from a fracture.  She was previously told by a physician that nothing should ever be placed in her right nares.  Cardiologist Dr. Antoine Poche, last visit 07/08/12. She describes discomfort in her left breast periodically at that time. She had had a normal stress echo in 2012 and her symptoms were felt unchanged so no further testing was recommended at that time.   She had a normal dobutamine stress echo on 07/18/11.   Cardiac cath on 03/02/07 showed:  1. Normal LV systolic function, ejection fraction 60%.  2. Widely patent previously placed stents in the mid right coronary artery in 2005.  3. Mild luminal irregularity of the left system (left main normal, LAD and CX with mild luminal irregularities).  EKG on 07/22/13 showed ST @ 104 bpm, LAFB, septal infarct (age undetermined).  Rate is up from 94 bpm, otherwise stable when compared to her EKG on 07/08/12.  CXR on 07/22/13 showed no acute cardiopulmonary disease.  I reviewed above with anesthesiologist Dr. Michelle Piper who felt that if she had not had any significant changes in her cardiopulmonary symptomology that it was anticipated that she could proceed as she had a normal stress echo two years  ago and tolerated ORIF approximately six months ago.  I called and spoke with patient via telephone this afternoon.  We had somewhat bad reception, but was able to hear that patient was treated for bronchitis a few weeks ago, but otherwise no acute cardiopulmonary changes since I evaluated her earlier this year (see my note from 12/13/12).  She still has some sinus drainage, but is overall feeling better. She is not having to use her nebulizer and only has occasional MDI use.  She denies wheezing at present. Her 07/22/13 CXR showed no acute process, and O2 sats were 96% at PAT.  I did encourage her to use her nebulizer on the morning of surgery. She will be further evaluated by her assigned anesthesiologist on the day of surgery.  If cardiopulmonary status appears stable at that time, then it is anticipated that she can proceed.  Velna Ochs [facility]/Anesthesiology Phone [phone] 07/25/2013 5:53 PM

## 2013-07-26 MED ORDER — CEFAZOLIN SODIUM-DEXTROSE 2-3 GM-% IV SOLR
2.0000 g | INTRAVENOUS | Status: AC
  Administered 2013-07-27: 2 g via INTRAVENOUS
  Filled 2013-07-26: qty 50

## 2013-07-27 ENCOUNTER — Inpatient Hospital Stay (HOSPITAL_COMMUNITY): Payer: Medicare Other | Admitting: Anesthesiology

## 2013-07-27 ENCOUNTER — Inpatient Hospital Stay (HOSPITAL_COMMUNITY)
Admission: RE | Admit: 2013-07-27 | Discharge: 2013-07-31 | DRG: 460 | Disposition: A | Discharge status: Home Health | Payer: Medicare Other | Source: Ambulatory Visit | Attending: Neurosurgery | Admitting: Neurosurgery

## 2013-07-27 ENCOUNTER — Encounter (HOSPITAL_COMMUNITY): Payer: Medicare Other | Admitting: Vascular Surgery

## 2013-07-27 ENCOUNTER — Inpatient Hospital Stay (HOSPITAL_COMMUNITY): Payer: Medicare Other

## 2013-07-27 ENCOUNTER — Encounter (HOSPITAL_COMMUNITY): Payer: Self-pay

## 2013-07-27 ENCOUNTER — Encounter (HOSPITAL_COMMUNITY)
Admission: RE | Disposition: A | Discharge status: Home Health | Payer: Medicare Other | Source: Ambulatory Visit | Attending: Neurosurgery

## 2013-07-27 DIAGNOSIS — Z794 Long term (current) use of insulin: Secondary | ICD-10-CM

## 2013-07-27 DIAGNOSIS — I2510 Atherosclerotic heart disease of native coronary artery without angina pectoris: Secondary | ICD-10-CM | POA: Diagnosis present

## 2013-07-27 DIAGNOSIS — Z9861 Coronary angioplasty status: Secondary | ICD-10-CM

## 2013-07-27 DIAGNOSIS — E785 Hyperlipidemia, unspecified: Secondary | ICD-10-CM | POA: Diagnosis present

## 2013-07-27 DIAGNOSIS — K219 Gastro-esophageal reflux disease without esophagitis: Secondary | ICD-10-CM | POA: Diagnosis present

## 2013-07-27 DIAGNOSIS — E119 Type 2 diabetes mellitus without complications: Secondary | ICD-10-CM | POA: Diagnosis present

## 2013-07-27 DIAGNOSIS — M47817 Spondylosis without myelopathy or radiculopathy, lumbosacral region: Principal | ICD-10-CM

## 2013-07-27 DIAGNOSIS — IMO0001 Reserved for inherently not codable concepts without codable children: Secondary | ICD-10-CM | POA: Diagnosis present

## 2013-07-27 DIAGNOSIS — Z96659 Presence of unspecified artificial knee joint: Secondary | ICD-10-CM

## 2013-07-27 DIAGNOSIS — K5900 Constipation, unspecified: Secondary | ICD-10-CM | POA: Diagnosis not present

## 2013-07-27 DIAGNOSIS — Z85118 Personal history of other malignant neoplasm of bronchus and lung: Secondary | ICD-10-CM

## 2013-07-27 DIAGNOSIS — N289 Disorder of kidney and ureter, unspecified: Secondary | ICD-10-CM | POA: Diagnosis present

## 2013-07-27 DIAGNOSIS — J438 Other emphysema: Secondary | ICD-10-CM | POA: Diagnosis present

## 2013-07-27 DIAGNOSIS — F17200 Nicotine dependence, unspecified, uncomplicated: Secondary | ICD-10-CM | POA: Diagnosis present

## 2013-07-27 DIAGNOSIS — Z79899 Other long term (current) drug therapy: Secondary | ICD-10-CM

## 2013-07-27 DIAGNOSIS — Q762 Congenital spondylolisthesis: Secondary | ICD-10-CM

## 2013-07-27 LAB — GLUCOSE, CAPILLARY
Glucose-Capillary: 247 mg/dL — ABNORMAL HIGH (ref 70–99)
Glucose-Capillary: 200 mg/dL — ABNORMAL HIGH (ref 70–99)

## 2013-07-27 SURGERY — POSTERIOR LUMBAR FUSION 1 LEVEL
Anesthesia: General | Site: Back | Laterality: Bilateral | Wound class: Clean

## 2013-07-27 MED ORDER — MIDAZOLAM HCL 5 MG/5ML IJ SOLN
INTRAMUSCULAR | Status: DC | PRN
  Administered 2013-07-27: 2 mg via INTRAVENOUS

## 2013-07-27 MED ORDER — OXYCODONE HCL 5 MG PO TABS
ORAL_TABLET | ORAL | Status: AC
  Filled 2013-07-27: qty 1

## 2013-07-27 MED ORDER — ONDANSETRON HCL 4 MG/2ML IJ SOLN
4.0000 mg | Freq: Four times a day (QID) | INTRAMUSCULAR | Status: DC | PRN
  Administered 2013-07-27 – 2013-07-29 (×2): 4 mg via INTRAVENOUS
  Filled 2013-07-27 (×2): qty 2

## 2013-07-27 MED ORDER — INSULIN ASPART 100 UNIT/ML ~~LOC~~ SOLN: 0.0000 [IU] | Freq: Every day | SUBCUTANEOUS | Status: DC

## 2013-07-27 MED ORDER — MORPHINE SULFATE (PF) 1 MG/ML IV SOLN
INTRAVENOUS | Status: DC
  Administered 2013-07-27: 13:00:00 via INTRAVENOUS
  Administered 2013-07-27: 18 mg via INTRAVENOUS
  Administered 2013-07-27: 6.76 mg via INTRAVENOUS
  Administered 2013-07-27: 3 mg via INTRAVENOUS
  Administered 2013-07-28: 6 mg via INTRAVENOUS
  Administered 2013-07-28: 4.47 mg via INTRAVENOUS
  Administered 2013-07-28: 13.5 mg via INTRAVENOUS
  Filled 2013-07-27 (×2): qty 25

## 2013-07-27 MED ORDER — SODIUM CHLORIDE 0.9 % IJ SOLN: 3.0000 mL | INTRAMUSCULAR | Status: DC | PRN

## 2013-07-27 MED ORDER — ONDANSETRON HCL 4 MG/2ML IJ SOLN
4.0000 mg | Freq: Four times a day (QID) | INTRAMUSCULAR | Status: DC | PRN
  Filled 2013-07-27: qty 2

## 2013-07-27 MED ORDER — HYDROMORPHONE HCL PF 1 MG/ML IJ SOLN
INTRAMUSCULAR | Status: AC
  Filled 2013-07-27: qty 1

## 2013-07-27 MED ORDER — PROPOFOL 10 MG/ML IV BOLUS
INTRAVENOUS | Status: DC | PRN
  Administered 2013-07-27: 200 mg via INTRAVENOUS

## 2013-07-27 MED ORDER — NALOXONE HCL 0.4 MG/ML IJ SOLN: 0.4000 mg | INTRAMUSCULAR | Status: DC | PRN

## 2013-07-27 MED ORDER — BUPIVACAINE-EPINEPHRINE PF 0.5-1:200000 % IJ SOLN
INTRAMUSCULAR | Status: DC | PRN
  Administered 2013-07-27: 20 mL

## 2013-07-27 MED ORDER — IPRATROPIUM-ALBUTEROL 18-103 MCG/ACT IN AERO
2.0000 | INHALATION_SPRAY | RESPIRATORY_TRACT | Status: DC | PRN
  Filled 2013-07-27: qty 14.7

## 2013-07-27 MED ORDER — SODIUM CHLORIDE 0.9 % IV SOLN
INTRAVENOUS | Status: DC
  Administered 2013-07-28: 03:00:00 via INTRAVENOUS

## 2013-07-27 MED ORDER — GLYCOPYRROLATE 0.2 MG/ML IJ SOLN
INTRAMUSCULAR | Status: DC | PRN
  Administered 2013-07-27: 0.2 mg via INTRAVENOUS
  Administered 2013-07-27: .5 mg via INTRAVENOUS

## 2013-07-27 MED ORDER — ONDANSETRON HCL 4 MG/2ML IJ SOLN
INTRAMUSCULAR | Status: DC | PRN
  Administered 2013-07-27: 4 mg via INTRAVENOUS

## 2013-07-27 MED ORDER — LACTATED RINGERS IV SOLN
INTRAVENOUS | Status: DC | PRN
  Administered 2013-07-27 (×2): via INTRAVENOUS

## 2013-07-27 MED ORDER — FENTANYL CITRATE 0.05 MG/ML IJ SOLN
INTRAMUSCULAR | Status: DC | PRN
  Administered 2013-07-27: 150 ug via INTRAVENOUS
  Administered 2013-07-27 (×4): 50 ug via INTRAVENOUS

## 2013-07-27 MED ORDER — DIPHENHYDRAMINE HCL 12.5 MG/5ML PO ELIX: 12.5000 mg | ORAL_SOLUTION | Freq: Four times a day (QID) | ORAL | Status: DC | PRN

## 2013-07-27 MED ORDER — NEOSTIGMINE METHYLSULFATE 1 MG/ML IJ SOLN
INTRAMUSCULAR | Status: DC | PRN
  Administered 2013-07-27: 3 mg via INTRAVENOUS

## 2013-07-27 MED ORDER — SODIUM CHLORIDE 0.9 % IV SOLN: 250.0000 mL | INTRAVENOUS | Status: DC

## 2013-07-27 MED ORDER — THROMBIN 20000 UNITS EX SOLR
CUTANEOUS | Status: DC | PRN
  Administered 2013-07-27: 10:00:00 via TOPICAL

## 2013-07-27 MED ORDER — LIDOCAINE HCL (CARDIAC) 20 MG/ML IV SOLN
INTRAVENOUS | Status: DC | PRN
  Administered 2013-07-27: 60 mg via INTRAVENOUS

## 2013-07-27 MED ORDER — MORPHINE SULFATE (PF) 1 MG/ML IV SOLN
INTRAVENOUS | Status: AC
  Filled 2013-07-27: qty 25

## 2013-07-27 MED ORDER — SIMVASTATIN 40 MG PO TABS
40.0000 mg | ORAL_TABLET | Freq: Every evening | ORAL | Status: DC
  Administered 2013-07-28 – 2013-07-30 (×2): 40 mg via ORAL
  Filled 2013-07-27 (×5): qty 1

## 2013-07-27 MED ORDER — HYDROMORPHONE HCL PF 1 MG/ML IJ SOLN
0.2500 mg | INTRAMUSCULAR | Status: DC | PRN
  Administered 2013-07-27 (×4): 0.5 mg via INTRAVENOUS

## 2013-07-27 MED ORDER — DIPHENHYDRAMINE HCL 50 MG/ML IJ SOLN: 12.5000 mg | Freq: Four times a day (QID) | INTRAMUSCULAR | Status: DC | PRN

## 2013-07-27 MED ORDER — ACETAMINOPHEN 650 MG RE SUPP: 650.0000 mg | RECTAL | Status: DC | PRN

## 2013-07-27 MED ORDER — SODIUM CHLORIDE 0.9 % IV SOLN
10.0000 mg | INTRAVENOUS | Status: DC | PRN
  Administered 2013-07-27: 25 ug/min via INTRAVENOUS

## 2013-07-27 MED ORDER — ROCURONIUM BROMIDE 100 MG/10ML IV SOLN
INTRAVENOUS | Status: DC | PRN
  Administered 2013-07-27: 50 mg via INTRAVENOUS

## 2013-07-27 MED ORDER — SODIUM CHLORIDE 0.9 % IJ SOLN
3.0000 mL | Freq: Two times a day (BID) | INTRAMUSCULAR | Status: DC
  Administered 2013-07-28 – 2013-07-29 (×3): 3 mL via INTRAVENOUS

## 2013-07-27 MED ORDER — SODIUM CHLORIDE 0.9 % IV SOLN
INTRAVENOUS | Status: DC | PRN
  Administered 2013-07-27: 09:00:00 via INTRAVENOUS

## 2013-07-27 MED ORDER — OXYCODONE-ACETAMINOPHEN 5-325 MG PO TABS: 1.0000 | ORAL_TABLET | ORAL | Status: DC | PRN

## 2013-07-27 MED ORDER — DIAZEPAM 5 MG PO TABS
5.0000 mg | ORAL_TABLET | Freq: Four times a day (QID) | ORAL | Status: DC | PRN
  Administered 2013-07-27 – 2013-07-29 (×3): 5 mg via ORAL
  Filled 2013-07-27 (×2): qty 1

## 2013-07-27 MED ORDER — MENTHOL 3 MG MT LOZG
1.0000 | LOZENGE | OROMUCOSAL | Status: DC | PRN
  Filled 2013-07-27: qty 9

## 2013-07-27 MED ORDER — ONDANSETRON HCL 4 MG/2ML IJ SOLN
4.0000 mg | INTRAMUSCULAR | Status: DC | PRN
  Administered 2013-07-28: 4 mg via INTRAVENOUS

## 2013-07-27 MED ORDER — PHENYLEPHRINE HCL 10 MG/ML IJ SOLN
INTRAMUSCULAR | Status: DC | PRN
  Administered 2013-07-27 (×2): 80 ug via INTRAVENOUS
  Administered 2013-07-27: 120 ug via INTRAVENOUS

## 2013-07-27 MED ORDER — CEFAZOLIN SODIUM 1-5 GM-% IV SOLN
1.0000 g | Freq: Three times a day (TID) | INTRAVENOUS | Status: AC
  Administered 2013-07-27 (×2): 1 g via INTRAVENOUS
  Filled 2013-07-27 (×3): qty 50

## 2013-07-27 MED ORDER — IPRATROPIUM BROMIDE 0.02 % IN SOLN: 500.0000 ug | Freq: Four times a day (QID) | RESPIRATORY_TRACT | Status: DC | PRN

## 2013-07-27 MED ORDER — ZOLPIDEM TARTRATE 5 MG PO TABS: 5.0000 mg | ORAL_TABLET | Freq: Every evening | ORAL | Status: DC | PRN

## 2013-07-27 MED ORDER — INSULIN ASPART 100 UNIT/ML ~~LOC~~ SOLN
0.0000 [IU] | Freq: Three times a day (TID) | SUBCUTANEOUS | Status: DC
  Administered 2013-07-27 – 2013-07-28 (×2): 7 [IU] via SUBCUTANEOUS
  Administered 2013-07-29 (×2): 4 [IU] via SUBCUTANEOUS
  Administered 2013-07-29 – 2013-07-30 (×3): 7 [IU] via SUBCUTANEOUS
  Administered 2013-07-30 – 2013-07-31 (×2): 4 [IU] via SUBCUTANEOUS
  Administered 2013-07-31: 11 [IU] via SUBCUTANEOUS

## 2013-07-27 MED ORDER — SODIUM CHLORIDE 0.9 % IJ SOLN: 9.0000 mL | INTRAMUSCULAR | Status: DC | PRN

## 2013-07-27 MED ORDER — ALBUMIN HUMAN 5 % IV SOLN
INTRAVENOUS | Status: DC | PRN
  Administered 2013-07-27: 11:00:00 via INTRAVENOUS

## 2013-07-27 MED ORDER — DIAZEPAM 5 MG PO TABS
ORAL_TABLET | ORAL | Status: AC
  Filled 2013-07-27: qty 1

## 2013-07-27 MED ORDER — PHENOL 1.4 % MT LIQD
1.0000 | OROMUCOSAL | Status: DC | PRN
  Filled 2013-07-27: qty 177

## 2013-07-27 MED ORDER — MAGNESIUM HYDROXIDE 400 MG/5ML PO SUSP: 30.0000 mL | Freq: Every day | ORAL | Status: DC | PRN

## 2013-07-27 MED ORDER — ACETAMINOPHEN 325 MG PO TABS
650.0000 mg | ORAL_TABLET | ORAL | Status: DC | PRN
  Administered 2013-07-28: 650 mg via ORAL
  Filled 2013-07-27: qty 2

## 2013-07-27 MED ORDER — 0.9 % SODIUM CHLORIDE (POUR BTL) OPTIME
TOPICAL | Status: DC | PRN
  Administered 2013-07-27: 1000 mL

## 2013-07-27 MED ORDER — THROMBIN 5000 UNITS EX SOLR
OROMUCOSAL | Status: DC | PRN
  Administered 2013-07-27: 10:00:00 via TOPICAL

## 2013-07-27 MED FILL — Sodium Chloride IV Soln 0.9%: INTRAVENOUS | Qty: 1000 | Status: AC

## 2013-07-27 MED FILL — Heparin Sodium (Porcine) Inj 1000 Unit/ML: INTRAMUSCULAR | Qty: 30 | Status: AC

## 2013-07-27 SURGICAL SUPPLY — 72 items
APL SKNCLS STERI-STRIP NONHPOA (GAUZE/BANDAGES/DRESSINGS) ×1
BENZOIN TINCTURE PRP APPL 2/3 (GAUZE/BANDAGES/DRESSINGS) ×2 IMPLANT
BLADE SURG ROTATE 9660 (MISCELLANEOUS) IMPLANT
BUR ACORN 6.0 (BURR) ×2 IMPLANT
BUR MATCHSTICK NEURO 3.0 LAGG (BURR) ×2 IMPLANT
CANISTER SUCTION 2500CC (MISCELLANEOUS) ×2 IMPLANT
CAP REVERE LOCKING (Cap) ×4 IMPLANT
CONT SPEC 4OZ CLIKSEAL STRL BL (MISCELLANEOUS) ×2 IMPLANT
COVER BACK TABLE 24X17X13 BIG (DRAPES) IMPLANT
COVER TABLE BACK 60X90 (DRAPES) ×2 IMPLANT
DRAPE C-ARM 42X72 X-RAY (DRAPES) ×4 IMPLANT
DRAPE LAPAROTOMY 100X72X124 (DRAPES) ×2 IMPLANT
DRAPE POUCH INSTRU U-SHP 10X18 (DRAPES) ×2 IMPLANT
DRAPE PROXIMA HALF (DRAPES) ×2 IMPLANT
DRSG OPSITE POSTOP 4X6 (GAUZE/BANDAGES/DRESSINGS) ×1 IMPLANT
DRSG PAD ABDOMINAL 8X10 ST (GAUZE/BANDAGES/DRESSINGS) IMPLANT
DURAPREP 26ML APPLICATOR (WOUND CARE) ×2 IMPLANT
ELECT BLADE 4.0 EZ CLEAN MEGAD (MISCELLANEOUS) ×2
ELECT REM PT RETURN 9FT ADLT (ELECTROSURGICAL) ×2
ELECTRODE BLDE 4.0 EZ CLN MEGD (MISCELLANEOUS) IMPLANT
ELECTRODE REM PT RTRN 9FT ADLT (ELECTROSURGICAL) ×1 IMPLANT
EVACUATOR 1/8 PVC DRAIN (DRAIN) ×1 IMPLANT
GAUZE SPONGE 4X4 16PLY XRAY LF (GAUZE/BANDAGES/DRESSINGS) ×2 IMPLANT
GLOVE BIO SURGEON STRL SZ8.5 (GLOVE) ×1 IMPLANT
GLOVE BIOGEL M 8.0 STRL (GLOVE) ×3 IMPLANT
GLOVE EXAM NITRILE LRG STRL (GLOVE) IMPLANT
GLOVE EXAM NITRILE MD LF STRL (GLOVE) IMPLANT
GLOVE EXAM NITRILE XL STR (GLOVE) IMPLANT
GLOVE EXAM NITRILE XS STR PU (GLOVE) IMPLANT
GLOVE INDICATOR 7.5 STRL GRN (GLOVE) ×2 IMPLANT
GLOVE SS BIOGEL STRL SZ 8 (GLOVE) IMPLANT
GLOVE SUPERSENSE BIOGEL SZ 8 (GLOVE) ×1
GLOVE SURG SS PI 7.0 STRL IVOR (GLOVE) ×3 IMPLANT
GOWN BRE IMP SLV AUR LG STRL (GOWN DISPOSABLE) ×2 IMPLANT
GOWN BRE IMP SLV AUR XL STRL (GOWN DISPOSABLE) ×2 IMPLANT
GOWN STRL REIN 2XL LVL4 (GOWN DISPOSABLE) IMPLANT
KIT BASIN OR (CUSTOM PROCEDURE TRAY) ×2 IMPLANT
KIT INFUSE MEDIUM (Orthopedic Implant) ×1 IMPLANT
KIT ROOM TURNOVER OR (KITS) ×2 IMPLANT
MILL MEDIUM DISP (BLADE) ×1 IMPLANT
NDL HYPO 18GX1.5 BLUNT FILL (NEEDLE) IMPLANT
NDL HYPO 21X1.5 SAFETY (NEEDLE) IMPLANT
NDL HYPO 25X1 1.5 SAFETY (NEEDLE) IMPLANT
NEEDLE HYPO 18GX1.5 BLUNT FILL (NEEDLE) IMPLANT
NEEDLE HYPO 21X1.5 SAFETY (NEEDLE) IMPLANT
NEEDLE HYPO 25X1 1.5 SAFETY (NEEDLE) IMPLANT
NS IRRIG 1000ML POUR BTL (IV SOLUTION) ×2 IMPLANT
PACK FOAM VITOSS 10CC (Orthopedic Implant) ×1 IMPLANT
PACK LAMINECTOMY NEURO (CUSTOM PROCEDURE TRAY) ×2 IMPLANT
PAD ARMBOARD 7.5X6 YLW CONV (MISCELLANEOUS) ×6 IMPLANT
PATTIES SURGICAL .5 X1 (DISPOSABLE) ×2 IMPLANT
PATTIES SURGICAL .5 X3 (DISPOSABLE) IMPLANT
ROD REVERE CURVED 6.35X35MM (Rod) ×2 IMPLANT
SCREW REVERE 6.35 5.5X40MM (Screw) ×4 IMPLANT
SPACER SUSTAIN O SML 8X22 12MM (Spacer) ×2 IMPLANT
SPONGE GAUZE 4X4 12PLY (GAUZE/BANDAGES/DRESSINGS) ×2 IMPLANT
SPONGE LAP 4X18 X RAY DECT (DISPOSABLE) IMPLANT
SPONGE NEURO XRAY DETECT 1X3 (DISPOSABLE) IMPLANT
SPONGE SURGIFOAM ABS GEL 100 (HEMOSTASIS) ×2 IMPLANT
STRIP CLOSURE SKIN 1/2X4 (GAUZE/BANDAGES/DRESSINGS) ×2 IMPLANT
SUT VIC AB 1 CT1 18XBRD ANBCTR (SUTURE) ×2 IMPLANT
SUT VIC AB 1 CT1 8-18 (SUTURE) ×4
SUT VIC AB 2-0 CP2 18 (SUTURE) ×2 IMPLANT
SUT VIC AB 3-0 SH 8-18 (SUTURE) ×2 IMPLANT
SYR 20CC LL (SYRINGE) IMPLANT
SYR 20ML ECCENTRIC (SYRINGE) ×2 IMPLANT
SYR 5ML LL (SYRINGE) IMPLANT
TAPE CLOTH SURG 4X10 WHT LF (GAUZE/BANDAGES/DRESSINGS) ×1 IMPLANT
TOWEL OR 17X24 6PK STRL BLUE (TOWEL DISPOSABLE) ×2 IMPLANT
TOWEL OR 17X26 10 PK STRL BLUE (TOWEL DISPOSABLE) ×2 IMPLANT
TRAY FOLEY CATH 14FRSI W/METER (CATHETERS) ×2 IMPLANT
WATER STERILE IRR 1000ML POUR (IV SOLUTION) ×2 IMPLANT

## 2013-07-27 NOTE — Preoperative (Signed)
Beta Blockers   Reason not to administer Beta Blockers:Not Applicable 

## 2013-07-27 NOTE — Transfer of Care (Signed)
Immediate Anesthesia Transfer of Care Note  Patient: [name]  Procedure(s) Performed: Procedure(s) with comments: LUMBER FOUR-FIVE POSTERIOR LUMBAR FUSION 1 LEVEL (Bilateral) - L4-5 Lumbar Fusion  Patient Location: PACU  Anesthesia Type:General  Level of Consciousness: awake, alert  and oriented  Airway & Oxygen Therapy: Patient Spontanous Breathing and Patient connected to nasal cannula oxygen  Post-op Assessment: Report given to PACU RN, Post -op Vital signs reviewed and stable and Patient moving all extremities X 4  Post vital signs: Reviewed and stable  Complications: No apparent anesthesia complications

## 2013-07-27 NOTE — Anesthesia Postprocedure Evaluation (Signed)
  Anesthesia Post-op Note  Patient: [name]  Procedure(s) Performed: Procedure(s) with comments: LUMBER FOUR-FIVE POSTERIOR LUMBAR FUSION 1 LEVEL (Bilateral) - L4-5 Lumbar Fusion  Patient Location: PACU  Anesthesia Type:General  Level of Consciousness: awake  Airway and Oxygen Therapy: Patient Spontanous Breathing  Post-op Pain: mild  Post-op Assessment: Post-op Vital signs reviewed  Post-op Vital Signs: Reviewed  Complications: No apparent anesthesia complications

## 2013-07-27 NOTE — Progress Notes (Signed)
Utilization review completed.  

## 2013-07-27 NOTE — H&P (Signed)
[name] is an 67 y.o. female.   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 Several epidural plus "burning" of the nerves did not help  Past Medical History  Diagnosis Date  . Asthma   . Cholelithiasis   . Colon polyps   . Coronary artery disease     The pt had stenting of the midsegment of the right coronary artery in 2005 with the Cypher stent. The last catheterization demonstrated an EF of 60%. The right coronary artery stent was patent. The left  main was normal. Circumflex had luminal irregularities. The LAD had luminal irregularities.   . Diabetes mellitus     Type II  . GERD (gastroesophageal reflux disease)   . Hyperlipidemia   . Lung cancer 2010    Adenocarcinoma, right lung, node positive  . Tobacco abuse   . Bronchitis   . Shortness of breath     with ambulation  . Arthritis   . Kidney stones     hx of  . Urgency of urination   . COPD (chronic obstructive pulmonary disease)     USES O2 AT NIGHT + PRN IN DAYTIME3L  . Anginal pain     PATIENT STATES DR. HOCHREIN STATED WAS FINE ON EXAM  . Emphysema   . Pneumonia   . H/O hiatal hernia   . Headache(784.0)   . Fibromyalgia   . Complication of anesthesia     no complication, but patient states no tube can be placed in her right nares due to narrowing from fracture    Past Surgical History  Procedure Laterality Date  . Appendectomy  1970  . Gallbladder surgery  1989  . Abdominal hysterectomy  1972  . Tonsillectomy  1966  . Carpal tunnel release    . Replacement total knee  2001    left  . Neck surgery  2005  . Pneumonectomy      Right Partial, lung cancer confirmed, nod positive  . Right upper lobectomy with lymph node dissection  10/12/2008    Dr [name]  . Cardiac catheterization    . Coronary angioplasty  2005  . Portacath placement  12/12/2007  . Port-a-cath removal  05/12/2012    Procedure: REMOVAL PORT-A-CATH;  Surgeon: Rita Slot, MD;  Location: [facility] OR;  Service: Thoracic;  Laterality: Left;  . Back surgery  2007  . Eye surgery      implants 02/18/12 (Left) 02/25/12 (right eye)  . Joint replacement    . Rotator cuff repair      RIGHT SHOULDER  2 YRS  . Orif humerus fracture Left 12/14/2012    Procedure: OPEN REDUCTION INTERNAL FIXATION (ORIF) LEFT PROXIMAL HUMERUS FRACTURE;  Surgeon: Rita Hitch, MD;  Location: [facility] OR;  Service: Orthopedics;  Laterality: Left;  . Cholecystectomy      Family History  Problem Relation Age of Onset  . Diabetes Mother   . Diabetes Father   . Heart disease    . Arthritis    . Lung disease    . Cancer    . Asthma     Social History:  reports that she has been smoking Cigarettes.  She has a 67.5 pack-year smoking history. She does not have any smokeless tobacco history on file. She reports that she does not drink alcohol or use illicit drugs.  Allergies:  Allergies  Allergen Reactions  . Celebrex [Celecoxib] Palpitations and Other (See Comments)  Chest pain, tachycardia, diaphoresis  . Niacin Rash  . Varenicline Tartrate Rash    CHANTIX    Medications Prior to Admission  Medication Sig Dispense Refill  . albuterol-ipratropium (COMBIVENT) 18-103 MCG/ACT inhaler Inhale 2 puffs into the lungs as needed for wheezing or shortness of breath.       . Cholecalciferol (VITAMIN D) 1000 UNITS capsule Take 1,000 Units by mouth every evening.       [name] HYDROcodone-acetaminophen (NORCO) 10-325 MG per tablet Take 1 tablet by mouth every 4 (four) hours as needed for pain.      [name] ibuprofen (ADVIL,MOTRIN) 200 MG tablet Take 400 mg by mouth every 6 (six) hours as needed for pain.      [name] insulin aspart (NOVOLOG) 100 UNIT/ML injection Inject 10-25 Units into the skin 3 (three) times daily before meals. Units given depends on her CBG readings. < 200 gives 10 units, > 200 gives 20 units, 20-25 units at supper depending on CBG reading      . insulin glargine (LANTUS) 100 UNIT/ML  injection Inject 50 Units into the skin at bedtime.       [name] ipratropium (ATROVENT) 0.02 % nebulizer solution Take 500 mcg by nebulization every 6 (six) hours as needed for wheezing.       Bertram Gala Glycol-Propyl Glycol (SYSTANE) 0.4-0.3 % SOLN Apply 1 drop to eye 2 (two) times daily.       [name] PROAIR HFA 108 (90 BASE) MCG/ACT inhaler Inhale 1-2 puffs into the lungs every 6 (six) hours as needed. For shortness of breath/wheezing      . simvastatin (ZOCOR) 40 MG tablet Take 40 mg by mouth every evening.      [name] esomeprazole (NEXIUM) 40 MG capsule Take 40 mg by mouth 2 (two) times daily as needed (for heartburm/ acid reflux).      . fexofenadine-pseudoephedrine (ALLEGRA-D 24) 180-240 MG per 24 hr tablet Take 1 tablet by mouth daily as needed (allergies).         Results for orders placed during the hospital encounter of 07/27/13 (from the past 48 hour(s))  GLUCOSE, CAPILLARY     Status: Abnormal   Collection Time    07/27/13  7:21 AM      Result Value Range   Glucose-Capillary 265 (*) 70 - 99 mg/dL   No results found.  Review of Systems  Constitutional: Negative.   HENT: Negative.   Eyes: Negative.   Respiratory: Positive for wheezing.   Cardiovascular: Negative.   Gastrointestinal: Positive for heartburn.  Musculoskeletal: Positive for back pain.  Skin: Negative.   Neurological: Positive for sensory change and focal weakness.  Endo/Heme/Allergies: Negative.   Psychiatric/Behavioral: Negative.     Blood pressure 153/75, pulse 94, temperature 97.7 F (36.5 C), temperature source Oral, resp. rate 20, SpO2 99.00%. Physical Exam hent, nl. Neck, nl. Cv, nl. Lungs, some mild wheezing. Abdomen and extremities, nl. NEURO slr positive at 80 degrees. Femora strecth maneuver positive bilaterally.mri show l4-5 spondylolisthesis with synovial cyst  Assessment/Plan Decompression and fusion at l4-5 . Patient aware of risks and benefits  Maikel Neisler M 07/27/2013, 8:44 AM

## 2013-07-27 NOTE — Anesthesia Preprocedure Evaluation (Signed)
Anesthesia Evaluation  Patient identified by MRN, date of birth, ID band Patient awake    Reviewed: Allergy & Precautions, H&P , NPO status , Patient's Chart, lab work & pertinent test results  Airway Mallampati: I      Dental  (+) Edentulous Upper and Edentulous Lower   Pulmonary shortness of breath, with exertion, lying and Long-Term Oxygen Therapy, asthma , COPD         Cardiovascular - angina    Neuro/Psych    GI/Hepatic GERD-  Controlled and Medicated,  Endo/Other  diabetes, Well Controlled, Type 2  Renal/GU Renal disease     Musculoskeletal  (+) Fibromyalgia -  Abdominal   Peds  Hematology   Anesthesia Other Findings   Reproductive/Obstetrics                           Anesthesia Physical Anesthesia Plan  ASA: III  Anesthesia Plan: General   Post-op Pain Management:    Induction: Intravenous  Airway Management Planned: Oral ETT  Additional Equipment:   Intra-op Plan:   Post-operative Plan: Extubation in OR  Informed Consent: I have reviewed the patients History and Physical, chart, labs and discussed the procedure including the risks, benefits and alternatives for the proposed anesthesia with the patient or authorized representative who has indicated his/her understanding and acceptance.   Dental advisory given  Plan Discussed with: CRNA and Anesthesiologist  Anesthesia Plan Comments:         Anesthesia Quick Evaluation

## 2013-07-27 NOTE — Progress Notes (Signed)
Op note [phone]

## 2013-07-27 NOTE — Anesthesia Procedure Notes (Signed)
Procedure Name: Intubation Date/Time: 07/27/2013 8:57 AM Performed by: Quentin Ore Pre-anesthesia Checklist: Patient identified, Emergency Drugs available, Suction available, Patient being monitored and Timeout performed Patient Re-evaluated:Patient Re-evaluated prior to inductionOxygen Delivery Method: Circle system utilized Preoxygenation: Pre-oxygenation with 100% oxygen Intubation Type: IV induction Ventilation: Mask ventilation without difficulty Laryngoscope Size: Mac and 3 Grade View: Grade I Tube type: Oral Tube size: 7.0 mm Number of attempts: 1 Airway Equipment and Method: Stylet Placement Confirmation: ETT inserted through vocal cords under direct vision,  positive ETCO2 and breath sounds checked- equal and bilateral Secured at: 21 cm Tube secured with: Tape Dental Injury: Teeth and Oropharynx as per pre-operative assessment

## 2013-07-28 LAB — GLUCOSE, CAPILLARY
Glucose-Capillary: 207 mg/dL — ABNORMAL HIGH (ref 70–99)
Glucose-Capillary: 245 mg/dL — ABNORMAL HIGH (ref 70–99)
Glucose-Capillary: 241 mg/dL — ABNORMAL HIGH (ref 70–99)
Glucose-Capillary: 181 mg/dL — ABNORMAL HIGH (ref 70–99)

## 2013-07-28 MED ORDER — OXYCODONE HCL 5 MG PO TABS
10.0000 mg | ORAL_TABLET | ORAL | Status: DC | PRN
  Administered 2013-07-28 – 2013-07-30 (×7): 10 mg via ORAL
  Filled 2013-07-28 (×8): qty 2

## 2013-07-28 NOTE — Evaluation (Signed)
Occupational Therapy Evaluation Patient Details Name: [name] MRN: [id number] DOB: 04-18-1946 Today's Date: 07/28/2013 Time: 4098-1191 OT Time Calculation (min): 37 min  OT Assessment / Plan / Recommendation History of present illness 67 yo female s/p Decompression and fusion at L4-5 with brace   Clinical Impression   Patient is s/p L4-5 fusion surgery resulting in functional limitations due to the deficits listed below (see OT problem list).  Patient will benefit from skilled OT acutely to increase independence and safety with ADLS to allow discharge SNF. Pt's family are unable to provide physical assistance of any kind for mobility. Pt must be MOD I for all mobility.     OT Assessment  Patient needs continued OT Services    Follow Up Recommendations  SNF;Supervision/Assistance - 24 hour    Barriers to Discharge      Equipment Recommendations  3 in 1 bedside comode;Other (comment) (RW)    Recommendations for Other Services    Frequency  Min 2X/week    Precautions / Restrictions Precautions Precautions: Back Precaution Booklet Issued: Yes (comment) Precaution Comments: educated and handout provided- will need further reinforcement due to arousal levels Required Braces or Orthoses: Spinal Brace Spinal Brace: Lumbar corset;Applied in sitting position Restrictions Weight Bearing Restrictions: No   Pertinent Vitals/Pain Severe pain Lethargic Needed cues supine for arousal    ADL  Upper Body Dressing: Maximal assistance Where Assessed - Upper Body Dressing: Supported sitting Lower Body Dressing: +1 Total assistance Where Assessed - Lower Body Dressing: Supported sit to Pharmacist, hospital: +2 Total assistance Toilet Transfer: Patient Percentage: 40% Statistician Method: Sit to Barista: Raised toilet seat with arms (or 3-in-1 over toilet) Toileting - Clothing Manipulation and Hygiene: +1 Total assistance Where Assessed - Toileting  Clothing Manipulation and Hygiene: Sit to stand from 3-in-1 or toilet Equipment Used: Gait belt;Back brace;Rolling walker Transfers/Ambulation Related to ADLs: Pt completed sit<>stand and chair was placed behind patient for void of bladde.r Pt was unable to tolerate stand pivot. ADL Comments: Pt educated on back precautions but due to arousal more education is needed next session. Pt states needing to void bladder. Pt completing bed mobility with extensive assistance and total +2 (A) to don brace. Pt unable to tolerate sitting for prolonged period. pt positioned on 3n1 with void of bladder. Pt return to supine. Pt lives with mother and daughter who both require DME for ambulation. Pt must be MOD I for all aspects of mobility to d/c home. Pt is unable to complete these task. Per family pt likely to decline SNF placement.     OT Diagnosis: Generalized weakness;Acute pain  OT Problem List: Decreased strength;Decreased activity tolerance;Impaired balance (sitting and/or standing);Decreased safety awareness;Decreased knowledge of use of DME or AE;Decreased knowledge of precautions;Pain OT Treatment Interventions: Self-care/ADL training;Therapeutic exercise;DME and/or AE instruction;Therapeutic activities;Patient/family education;Balance training   OT Goals(Current goals can be found in the care plan section) Acute Rehab OT Goals Patient Stated Goal: "i am so tired I just want to sleep" OT Goal Formulation: With patient/family Time For Goal Achievement: 08/11/13 Potential to Achieve Goals: Good ADL Goals Pt Will Perform Grooming: with modified independence;standing Pt Will Perform Upper Body Bathing: with modified independence;sitting Pt Will Perform Lower Body Bathing: with modified independence;sit to/from stand Pt Will Transfer to Toilet: with modified independence;bedside commode Additional ADL Goal #1: Pt will complete bed mobility MOD I   Visit Information  Last OT Received On:  07/28/13 Assistance Needed: +2 PT/OT Co-Evaluation/Treatment: Yes History of Present  Illness: 67 yo female s/p Decompression and fusion at L4-5 with brace       Prior Functioning     Home Living Family/patient expects to be discharged to:: Private residence Living Arrangements: Children Available Help at Discharge: Family Type of Home: House Home Access: Stairs to enter;Ramped entrance Home Layout: One level Home Equipment: None;Walker - 2 wheels;Cane - single point;Shower seat - built in Prior Function Level of Independence: Independent Communication Communication: No difficulties (glasses) Dominant Hand: Right         Vision/Perception Vision - History Baseline Vision: Wears glasses all the time Patient Visual Report: No change from baseline   Cognition  Cognition Arousal/Alertness: Lethargic Behavior During Therapy: Flat affect Overall Cognitive Status: Impaired/Different from baseline Area of Impairment: Attention;Memory;Awareness;Problem solving Current Attention Level: Sustained Memory: Decreased recall of precautions;Decreased short-term memory Awareness: Emergent;Anticipatory Problem Solving: Slow processing;Difficulty sequencing;Requires verbal cues;Requires tactile cues    Extremity/Trunk Assessment Upper Extremity Assessment Upper Extremity Assessment: Overall WFL for tasks assessed Lower Extremity Assessment Lower Extremity Assessment: Defer to PT evaluation Cervical / Trunk Assessment Cervical / Trunk Assessment: Normal     Mobility Bed Mobility Bed Mobility: Supine to Sit;Sitting - Scoot to [place] of Bed;Sit to Supine Supine to Sit: 1: +2 Total assist;With rails;HOB elevated Supine to Sit: Patient Percentage: 30% Sitting - Scoot to Edge of Bed: 1: +2 Total assist Sitting - Scoot to Edge of Bed: Patient Percentage: 0% Sit to Supine: 1: +2 Total assist;With rail;HOB flat Sit to Supine: Patient Percentage: 40% Details for Bed Mobility Assistance:  pt educated on sequence and required tactile input to help to facilitate. Pt with posterior lean.  Transfers Transfers: Sit to Stand;Stand to Sit Sit to Stand: 1: +2 Total assist;With upper extremity assist;From bed;From elevated surface Sit to Stand: Patient Percentage: 40% Stand to Sit: 1: +2 Total assist;With upper extremity assist;To bed;To chair/3-in-1 Stand to Sit: Patient Percentage: 30% Details for Transfer Assistance: pt needed constant cues for hand placement and safety. Pt reports "i can't!" Pt encouraged and able to complete static standing briefly     Exercise     Balance Balance Balance Assessed: Yes Static Sitting Balance Static Sitting - Balance Support: Bilateral upper extremity supported;Feet supported Static Sitting - Level of Assistance: 2: Max assist   End of Session OT - End of Session Activity Tolerance: Patient limited by pain;Patient limited by lethargy Patient left: in bed;with call bell/phone within reach;with family/visitor present;with bed alarm set Nurse Communication: Mobility status;Precautions  GO     Harolyn Rutherford 07/28/2013, 3:56 PM Pager: [phone]

## 2013-07-28 NOTE — Op Note (Signed)
NAMEALYIA, LACERTE                ACCOUNT NO.:  [id number]  MEDICAL RECORD NO.:  [id number]  LOCATION:  [id number]                        FACILITY:  [facility]  PHYSICIAN:  Hilda Lias, M.D.   DATE OF BIRTH:  1946/05/27  DATE OF PROCEDURE:  07/27/2013 DATE OF DISCHARGE:                              OPERATIVE REPORT   PREOPERATIVE DIAGNOSIS:  L4-5 spondylolisthesis, chronic radiculopathy, status post decompression right L4-5, L5-S1.  POSTOPERATIVE DIAGNOSIS:  L4-5 spondylolisthesis, chronic radiculopathy, status post decompression right L4-5, L5-S1.  PROCEDURE:  Bilateral L4 laminectomy and facetectomy, bilateral L4-5 diskectomy beyond normal to be able to introduce the cages.  Lysis of adhesion, interbody fusion without cages, 12 x 22 pedicle screws at L4- L5, posterolateral arthrodesis without BMP autograft on VITOSS.  SURGEON:  Hilda Lias, M.D.  ASSISTING:  Cristi Loron, M.D.  CLINICAL HISTORY:  [name] is a lady, who in the past underwent surgery of the right side.  The procedure was laminotomy at the level of L4-5 and L5-S1.  Lately, the patient was complaining of back pain worsened to both legs, worse with walking.  She has failed conservative treatment including procedure to relieve the pain using heating all the nerve root.  Nevertheless, she is worse.  The x-ray shows she has spondylolisthesis at L4-5 deformity of the thecal sac with quite a bit of foraminal narrowing.  Also she has degenerative changes above and below.  Surgery was advised and the risk was fully explained to her and her family including the benefits.  PROCEDURE IN DETAIL:  The patient was taken to the OR, and after intubation, she was positioned in a prone manner.  The back was cleaned with DuraPrep, and the drapes were applied.  Midline incision resecting the previous scar was made from L3-4 down to L5-S1 and the muscles was retracted all the way laterally until we were able to see the  lateral aspect of the facet of L4-5.  X-rays showed that the clip was at the level of the spinous process of L4-L5.  From then on, we removed the spinous process of L4 followed by the lamina.  In the right side, the patient had quite a bit of adhesion and lysis was accomplished, and at the end, we had a good decompression of the thecal sac.  We cleaned the nerve root bilaterally lysis of the L4 and L5 was achieved.  We entered the disk space 1st in the right side and then the left side.  We will do total gross diskectomy medial and laterally removing scar tissue, which was mostly localized on the knee in the ventral aspect of the thecal sac.  Then, the endplates were removed and 2 cages of 12 x 22 were inserted.  The cages had BMP in the front and autograft.  Then, using the C-arm first in AP view and then a lateral view, we made a hole in the pedicle of L4-L5.  Prior to introduction of the screws, we feel all the 4 quadrants just to be sure that were surrounded by bone.  Then, 4 screws of 5.5 x 40 were inserted.  There were connected with the rods and have been in place with  Capps .  Then, we went laterally and we removed the periosteum of the lateral aspect of the facet of L4-5 as well as the takeoff of the transverse process.  Then, a mix of autograft, BMP, and VITOSS was done for arthrodesis.  The area was irrigated.  Hemostasis was accomplished.  We left the catheter in the epidural space and the wound was closed in different layers with Vicryl and Steri-Strips.          ______________________________ Hilda Lias, M.D.     EB/MEDQ  D:  07/27/2013  T:  07/28/2013  Job:  [id number]

## 2013-07-28 NOTE — Progress Notes (Signed)
CBGs on 10/22   [phone] mg/dl       45/40  981-191 mg/dl Recommend starting Lantus 25 units daily and titrate up to home dose of 50 units daily.  Continue Novolog RESISTANT correction scale with meals and bedtime.   Smith Mince RN BSN CDE

## 2013-07-28 NOTE — Progress Notes (Signed)
Patient ID: [name], female   DOB: 27-Jul-1946, 67 y.o.   MRN: [id number] Sleepy, no weakness.dfrain with some collection. Plan to dc morphine

## 2013-07-28 NOTE — Evaluation (Signed)
Physical Therapy Evaluation Patient Details Name: NELSON JULSON MRN: [id number] DOB: 27-Oct-1945 Today's Date: 07/28/2013 Time: 8413-2440 PT Time Calculation (min): 37 min  PT Assessment / Plan / Recommendation History of Present Illness  67 yo female s/p Decompression and fusion at L4-5 with brace  Clinical Impression  Patient demonstrates deficits in functional mobility as indicated below. Pt will benefit from continued skilled PT to address deficits and maximize independence.  Based on current functional levels and need for assist, rec ST SNF upon discharge to ensure safety with mobility. Will continue to see as indicated and progress as tolerated.    PT Assessment  Patient needs continued PT services    Follow Up Recommendations  SNF    Does the patient have the potential to tolerate intense rehabilitation      Barriers to Discharge        Equipment Recommendations  Rolling walker with 5" wheels    Recommendations for Other Services     Frequency Min 5X/week    Precautions / Restrictions Precautions Precautions: Back Precaution Booklet Issued: Yes (comment) Precaution Comments: educated and handout provided- will need further reinforcement due to arousal levels Required Braces or Orthoses: Spinal Brace Spinal Brace: Lumbar corset;Applied in sitting position Restrictions Weight Bearing Restrictions: No   Pertinent Vitals/Pain No value for pain given, SpO2 on room air 87%      Mobility  Bed Mobility Bed Mobility: Supine to Sit;Sitting - Scoot to [place] of Bed;Sit to Supine Supine to Sit: 1: +2 Total assist;With rails;HOB elevated Supine to Sit: Patient Percentage: 30% Sitting - Scoot to Edge of Bed: 1: +2 Total assist Sitting - Scoot to Edge of Bed: Patient Percentage: 0% Sit to Supine: 1: +2 Total assist;With rail;HOB flat Sit to Supine: Patient Percentage: 40% Details for Bed Mobility Assistance: pt educated on sequence and required tactile input to help to  facilitate. Pt with posterior lean.  Transfers Transfers: Sit to Stand;Stand to Sit Sit to Stand: 1: +2 Total assist;With upper extremity assist;From bed;From elevated surface Sit to Stand: Patient Percentage: 40% Stand to Sit: 1: +2 Total assist;With upper extremity assist;To bed;To chair/3-in-1 Stand to Sit: Patient Percentage: 30% Details for Transfer Assistance: pt needed constant cues for hand placement and safety. Pt reports "i can't!" Pt encouraged and able to complete static standing briefly        PT Diagnosis: Difficulty walking;Abnormality of gait;Generalized weakness;Acute pain  PT Problem List: Decreased strength;Decreased range of motion;Decreased activity tolerance;Decreased balance;Decreased mobility;Decreased coordination;Decreased cognition;Decreased knowledge of use of DME;Pain PT Treatment Interventions: DME instruction;Gait training;Stair training;Functional mobility training;Therapeutic activities;Therapeutic exercise;Balance training;Patient/family education     PT Goals(Current goals can be found in the care plan section) Acute Rehab PT Goals Patient Stated Goal: "i am so tired I just want to sleep" PT Goal Formulation: With patient/family Time For Goal Achievement: 08/11/13 Potential to Achieve Goals: Good  Visit Information  Last PT Received On: 07/28/13 Assistance Needed: +2 History of Present Illness: 67 yo female s/p Decompression and fusion at L4-5 with brace       Prior Functioning  Home Living Family/patient expects to be discharged to:: Private residence Living Arrangements: Children Available Help at Discharge: Family Type of Home: House Home Access: Stairs to enter;Ramped entrance Home Layout: One level Home Equipment: None;Walker - 2 wheels;Cane - single point;Shower seat - built in Prior Function Level of Independence: Independent Communication Communication: No difficulties (glasses) Dominant Hand: Right    Cognition   Cognition Arousal/Alertness: Lethargic Behavior During Therapy: Flat  affect Overall Cognitive Status: Impaired/Different from baseline Area of Impairment: Attention;Memory;Awareness;Problem solving Current Attention Level: Sustained Memory: Decreased recall of precautions;Decreased short-term memory Awareness: Emergent;Anticipatory Problem Solving: Slow processing;Difficulty sequencing;Requires verbal cues;Requires tactile cues    Extremity/Trunk Assessment Upper Extremity Assessment Upper Extremity Assessment: Overall WFL for tasks assessed Lower Extremity Assessment Lower Extremity Assessment: Generalized weakness Cervical / Trunk Assessment Cervical / Trunk Assessment: Normal   Balance Balance Balance Assessed: Yes Static Sitting Balance Static Sitting - Balance Support: Bilateral upper extremity supported;Feet supported Static Sitting - Level of Assistance: 2: Max assist  End of Session PT - End of Session Equipment Utilized During Treatment: Gait belt;Back brace;Oxygen Activity Tolerance: Patient limited by fatigue;Patient limited by lethargy;Patient limited by pain Patient left: in bed;with call bell/phone within reach;with bed alarm set;with family/visitor present Nurse Communication: Mobility status;Other (comment) (decreased SpO2)  GP     Fabio Asa 07/28/2013, 4:21 PM Charlotte Crumb, PT DPT  [phone]

## 2013-07-28 NOTE — Progress Notes (Signed)
Pt not eating well refused insulin at 1700. Was i/o cath and 650 mls urine noted see flow sheet

## 2013-07-29 ENCOUNTER — Other Ambulatory Visit: Payer: Self-pay | Admitting: Physical Medicine and Rehabilitation

## 2013-07-29 DIAGNOSIS — IMO0002 Reserved for concepts with insufficient information to code with codable children: Secondary | ICD-10-CM

## 2013-07-29 DIAGNOSIS — Q762 Congenital spondylolisthesis: Secondary | ICD-10-CM

## 2013-07-29 LAB — GLUCOSE, CAPILLARY
Glucose-Capillary: 200 mg/dL — ABNORMAL HIGH (ref 70–99)
Glucose-Capillary: 156 mg/dL — ABNORMAL HIGH (ref 70–99)
Glucose-Capillary: 205 mg/dL — ABNORMAL HIGH (ref 70–99)
Glucose-Capillary: 185 mg/dL — ABNORMAL HIGH (ref 70–99)

## 2013-07-29 MED ORDER — FLEET ENEMA 7-19 GM/118ML RE ENEM
1.0000 | ENEMA | Freq: Every day | RECTAL | Status: DC | PRN
  Administered 2013-07-29: 1 via RECTAL
  Filled 2013-07-29: qty 1

## 2013-07-29 NOTE — Progress Notes (Signed)
Patient ID: [name], female   DOB: 09-11-1946, 67 y.o.   MRN: [id number] Sleepy, no weakness. Constipated. No bm.

## 2013-07-29 NOTE — Progress Notes (Addendum)
Patient is having a hard time tolerating therapy and SNF recommended. Her insurance will not cover CIR and would recommend pursuing rehab at SNF past discharge.  Agree. Pt fairly lethargic and confused. Probably needs SNF given assistance available at home. Refused any type of rehab today, but she was in a competent state to make such decisions.

## 2013-07-29 NOTE — Progress Notes (Signed)
Occupational Therapy Treatment Patient Details Name: [name] MRN: [id number] DOB: 1945/11/06 Today's Date: 07/29/2013 Time: 1324-4010 OT Time Calculation (min): 51 min  OT Assessment / Plan / Recommendation  History of present illness 67 yo female s/p Decompression and fusion at L4-5 with brace   OT comments  Pt progressing slowly with therapy and demonstrates cognitive deficits. Pt requires 3L oxygen throughout session due to DOE and desaturation. Pt reports not using oxygen at home during the day. Pt s cognition and poor activity tolerance with oxygen saturation levels questions anoxic event. Pt very lethargic and needs constant cueing. Pt repeats "give me time" but will fall asleep sitting upright then deny being asleep.  Follow Up Recommendations  SNF;Supervision/Assistance - 24 hour    Barriers to Discharge       Equipment Recommendations  None recommended by OT    Recommendations for Other Services    Frequency Min 2X/week   Progress towards OT Goals Progress towards OT goals: Progressing toward goals  Plan Discharge plan remains appropriate    Precautions / Restrictions Precautions Precautions: Back Precaution Comments: reviewed back precautions with poor recall from previous therapy sessions Required Braces or Orthoses: Spinal Brace Spinal Brace: Lumbar corset;Applied in sitting position   Pertinent Vitals/Pain Reports back pain    ADL  Eating/Feeding: Set up Where Assessed - Eating/Feeding: Chair Grooming: Wash/dry face;Min guard Where Assessed - Grooming: Supported sitting Upper Body Dressing: Minimal assistance Where Assessed - Upper Body Dressing: Unsupported sitting Lower Body Dressing: +1 Total assistance Where Assessed - Lower Body Dressing: Unsupported sit to stand Toilet Transfer: Minimal assistance Toilet Transfer Method: Stand pivot Toilet Transfer Equipment: Raised toilet seat with arms (or 3-in-1 over toilet) Toileting - Clothing  Manipulation and Hygiene: +1 Total assistance Where Assessed - Toileting Clothing Manipulation and Hygiene: Sit to stand from 3-in-1 or toilet Equipment Used: Back brace;Gait belt;Rolling walker Transfers/Ambulation Related to ADLs: Pt completing transfer with RW to bedside commode with extended time to initiate task and c/o height of chair. Pt's 3n1 height reduced and then pt states "i can do it at home" Pt educated multiple times during session that she must demonstrate all task mod I here at [facility] or we can not recommend return home ADL Comments: Pt agreeable to OOB and c/o not voiding bowels since last wednesday. pt with medications provided earlier by RN. PT transfered to 3n1 with void of bladder only. Pt encouraged to ambulated ~10 ft with RW and sit upright in chair. pt with urgency and starting to void amber colored muscus . Pt transfered to 3n1 with void of green yellow and dark blackish stool. Pt provided total LOB peri care and new gown applied. Pt with decr arousal during session and neeeding name call to arouse. Pt with no recall of meals today but says she ate very little. Pt introduced to MD from CIR and his purpose and was unable to recall his name or why he came ~1 minute after his departure. Pt with poor recall short term memory at this time. Pt reports she can do all task sleep eat and mobilize so much better at home than in [facility]. Pt is NOT SAFE to d/c home at this time or over the weekend. SHe needs assistance that family can not provide at home. Pt must be MOD I for home d/c    OT Diagnosis:    OT Problem List:   OT Treatment Interventions:     OT Goals(current goals can now be found in  the care plan section) Acute Rehab OT Goals Patient Stated Goal: to go home OT Goal Formulation: With patient/family Time For Goal Achievement: 08/11/13 Potential to Achieve Goals: Good ADL Goals Pt Will Perform Grooming: with modified independence;standing Pt Will Perform Upper Body Bathing: with  modified independence;sitting Pt Will Perform Lower Body Bathing: with modified independence;sit to/from stand Pt Will Transfer to Toilet: with modified independence;bedside commode Additional ADL Goal #1: Pt will complete bed mobility MOD I   Visit Information  Last OT Received On: 07/29/13 Assistance Needed: +1 History of Present Illness: 67 yo female s/p Decompression and fusion at L4-5 with brace    Subjective Data      Prior Functioning       Cognition  Cognition Arousal/Alertness: Lethargic Behavior During Therapy: Flat affect Overall Cognitive Status: Impaired/Different from baseline Area of Impairment: Attention;Memory;Awareness;Problem solving;Safety/judgement Current Attention Level: Sustained Memory: Decreased recall of precautions;Decreased short-term memory Safety/Judgement: Decreased awareness of safety Awareness: Emergent;Anticipatory Problem Solving: Slow processing;Difficulty sequencing;Requires verbal cues;Requires tactile cues    Mobility  Bed Mobility Bed Mobility: Rolling Left;Left Sidelying to Sit;Supine to Sit;Sitting - Scoot to Edge of Bed Rolling Left: 4: Min guard;With rail Left Sidelying to Sit: 4: Min guard;With rails Supine to Sit: 4: Min assist;With rails Sitting - Scoot to Edge of Bed: 4: Min assist;With rail Details for Bed Mobility Assistance: pt relies heavily on rails for mobility. pt will not have rails at dc and must be able to complete center of bed transfer without rails prior to d/c Transfers Transfers: Sit to Stand;Stand to Sit Sit to Stand: 4: Min assist;With upper extremity assist;From bed Stand to Sit: 4: Min assist;With upper extremity assist;To bed Details for Transfer Assistance: v/c for hand placement and safety.     Exercises      Balance     End of Session OT - End of Session Activity Tolerance: Patient limited by lethargy;Patient limited by pain Patient left: in chair;with call bell/phone within reach;with chair alarm  set;with family/visitor present Nurse Communication: Mobility status;Precautions  GO     Harolyn Rutherford 07/29/2013, 4:14 PM Pager: [phone]

## 2013-07-29 NOTE — Progress Notes (Signed)
Physical Therapy Treatment Patient Details Name: [name] MRN: [id number] DOB: 1946/06/16 Today's Date: 07/29/2013 Time: 1012-1043 PT Time Calculation (min): 31 min  PT Assessment / Plan / Recommendation  History of Present Illness 67 yo female s/p Decompression and fusion at L4-5 with brace   PT Comments   Patient continues to demonstrate deficits in functional mobility requiring 2 person assist. Patient very reluctant to perform any OOB activity. Patient unable to stand without +2 assist and max cues for proper safety as patient continues to try to pull up from RW to stand. When applying brace, patient reported that her abdomen was swollen more today (visually this does appear the case compared to yesterdays session).  Patient also complaining of some nausea and no desire to eat. Encouraged patient and educated family on the importance of OOB mobility.  Advised pt to have nsg use bed side commode instead of bed pan, also advised OOB for meals and encouraged further OOB activity with staff to help aid in digestion function and lung health. Spoke with nsg regarding concerns with patients lack of OOB and pt reported symptoms.  Will continue to work with patient, at this time, patient inability to perform functional movements without increased assist make patient a significant risk for falls and complications. At this time, continue to recommend ST SNF upon discharge.  Follow Up Recommendations  SNF           Equipment Recommendations  Rolling walker with 5" wheels       Frequency Min 5X/week   Progress towards PT Goals  Very modest progress towards PT goals  Plan Current plan remains appropriate    Precautions / Restrictions Precautions Precautions: Back Precaution Booklet Issued: Yes (comment) Precaution Comments: educated and handout provided- will need further reinforcement due to arousal levels Required Braces or Orthoses: Spinal Brace Spinal Brace: Lumbar corset;Applied in  sitting position Restrictions Weight Bearing Restrictions: No   Pertinent Vitals/Pain Patient did not quantify pain with numerical value    Mobility  Bed Mobility Bed Mobility: Rolling Left;Left Sidelying to Sit;Sitting - Scoot to [place] of Bed Rolling Left: 4: Min assist Left Sidelying to Sit: 2: Max assist Sitting - Scoot to [place] of Bed: 3: Mod assist Details for Bed Mobility Assistance: assist to elevate trunk, used chuck pad to assist with rotation of hip to EOB Transfers Transfers: Sit to Stand;Stand to Sit Sit to Stand: 1: +2 Total assist;With upper extremity assist;From bed;From elevated surface Sit to Stand: Patient Percentage: 40% Stand to Sit: 1: +2 Total assist;With upper extremity assist;To bed;To chair/3-in-1 Stand to Sit: Patient Percentage: 60% Details for Transfer Assistance: Patient continues to attempt unsafe transfer despite cues for hand placement, Patient still requires increased assist to reach standing (+2). Ambulation/Gait Ambulation/Gait Assistance: 4: Min assist Ambulation Distance (Feet): 12 Feet Assistive device: Rolling walker Ambulation/Gait Assistance Details: mac ues for encouragement and safety with use of RW.  Gait Pattern: Step-to pattern;Trunk flexed Gait velocity: decreased General Gait Details: patient very relunctant to perform activities     PT Goals (current goals can now be found in the care plan section) Acute Rehab PT Goals Patient Stated Goal: to go home PT Goal Formulation: With patient/family Time For Goal Achievement: 08/11/13 Potential to Achieve Goals: Good  Visit Information  Last PT Received On: 07/29/13 Assistance Needed: +2 History of Present Illness: 67 yo female s/p Decompression and fusion at L4-5 with brace    Subjective Data  Subjective: I just want to lay down Patient  Stated Goal: to go home   Cognition  Cognition Arousal/Alertness: Lethargic Behavior During Therapy: Flat affect Overall Cognitive Status:  Impaired/Different from baseline Area of Impairment: Attention;Memory;Awareness;Problem solving Current Attention Level: Sustained Memory: Decreased recall of precautions;Decreased short-term memory Awareness: Emergent;Anticipatory Problem Solving: Slow processing;Difficulty sequencing;Requires verbal cues;Requires tactile cues    Balance  Balance Balance Assessed: Yes Static Sitting Balance Static Sitting - Balance Support: Bilateral upper extremity supported;Feet supported Static Sitting - Level of Assistance: 5: Stand by assistance  End of Session PT - End of Session Equipment Utilized During Treatment: Gait belt;Back brace;Oxygen Activity Tolerance: Patient limited by fatigue;Patient limited by lethargy;Patient limited by pain Patient left: in chair;with call bell/phone within reach;with chair alarm set;with family/visitor present Nurse Communication: Mobility status;Other (comment) (decreased SpO2)   GP     Fabio Asa 07/29/2013, 11:04 AM Charlotte Crumb, PT DPT  [phone]

## 2013-07-29 NOTE — Progress Notes (Signed)
[employer] medicare will not approve an admission to inpt rehab for a lumbar fusion. Please call me with any questions. [phone]

## 2013-07-30 LAB — GLUCOSE, CAPILLARY
Glucose-Capillary: 191 mg/dL — ABNORMAL HIGH (ref 70–99)
Glucose-Capillary: 203 mg/dL — ABNORMAL HIGH (ref 70–99)
Glucose-Capillary: 192 mg/dL — ABNORMAL HIGH (ref 70–99)

## 2013-07-30 MED ORDER — HYDROCODONE-ACETAMINOPHEN 10-325 MG PO TABS
1.0000 | ORAL_TABLET | ORAL | Status: DC | PRN
  Administered 2013-07-30: 2 via ORAL
  Administered 2013-07-30 – 2013-07-31 (×3): 1 via ORAL
  Filled 2013-07-30 (×4): qty 1
  Filled 2013-07-30: qty 2

## 2013-07-30 NOTE — Progress Notes (Signed)
RN found that pt was without an IV assess. RN explained to pt that another one would be started. Pt stated that she would not like one started tonight. Will continue to monitor

## 2013-07-30 NOTE — Progress Notes (Signed)
Physical Therapy Treatment Patient Details Name: [name] MRN: [id number] DOB: [birth year] Today's Date: 07/30/2013 Time: 4098-1191 PT Time Calculation (min): 24 min  PT Assessment / Plan / Recommendation  History of Present Illness 67 yo female s/p Decompression and fusion at L4-5 with brace   PT Comments   Patient making improvement today with mobility and gait.  Patient refusing SNF.  With improved gait today, patient could go home with 24 hour assist and HHPT follow-up.  Follow Up Recommendations  Home health PT;Supervision/Assistance - 24 hour     Does the patient have the potential to tolerate intense rehabilitation     Barriers to Discharge        Equipment Recommendations  Rolling walker with 5" wheels    Recommendations for Other Services    Frequency Min 5X/week   Progress towards PT Goals Progress towards PT goals: Progressing toward goals  Plan Discharge plan needs to be updated    Precautions / Restrictions Precautions Precautions: Back Precaution Comments: reviewed back precautions with poor recall from previous therapy sessions Required Braces or Orthoses: Spinal Brace Spinal Brace: Lumbar corset;Applied in sitting position Restrictions Weight Bearing Restrictions: No   Pertinent Vitals/Pain Pain impacting mobility.    Mobility  Bed Mobility Bed Mobility: Not assessed Transfers Transfers: Sit to Stand;Stand to Sit Sit to Stand: 4: Min guard;With upper extremity assist;With armrests;From chair/3-in-1 Stand to Sit: 4: Min guard;With upper extremity assist;With armrests;To chair/3-in-1 Details for Transfer Assistance: Verbal cues for hand placement.  Patient was able to stand stright up rather than leaning forward. Ambulation/Gait Ambulation/Gait Assistance: 4: Min guard Ambulation Distance (Feet): 69 Feet Assistive device: Rolling walker Ambulation/Gait Assistance Details: Cues to stand upright.  Improved use of RW Gait Pattern: Step-through  pattern;Decreased stride length Gait velocity: decreased      PT Goals (current goals can now be found in the care plan section)    Visit Information  Last PT Received On: 07/30/13 Assistance Needed: +1 (+1 for chair) History of Present Illness: 67 yo female s/p Decompression and fusion at L4-5 with brace    Subjective Data  Subjective: "I didn't know I went that far"   Cognition  Cognition Arousal/Alertness: Lethargic Behavior During Therapy: WFL for tasks assessed/performed Overall Cognitive Status: Impaired/Different from baseline Area of Impairment: Memory;Safety/judgement Memory: Decreased recall of precautions;Decreased short-term memory Safety/Judgement: Decreased awareness of safety Problem Solving: Decreased initiation;Requires verbal cues General Comments: Somewhat more alert today    Balance     End of Session PT - End of Session Equipment Utilized During Treatment: Gait belt;Back brace Activity Tolerance: Patient limited by pain;Patient limited by fatigue Patient left: in chair;with call bell/phone within reach;with chair alarm set;with family/visitor present Nurse Communication: Mobility status   GP     Vena Austria 07/30/2013, 2:32 PM Durenda Hurt. Renaldo Fiddler, [facility] Acute Rehab Services Pager [phone]

## 2013-07-30 NOTE — Progress Notes (Addendum)
Occupational Therapy Treatment Patient Details Name: [name] MRN: [id number] DOB: 23-Nov-1945 Today's Date: 07/30/2013 Time: 4098-1191 OT Time Calculation (min): 33 min  OT Assessment / Plan / Recommendation  History of present illness 67 yo female s/p Decompression and fusion at L4-5 with brace   OT comments  Pt progressing towards goals. Pt refusing SNF at this time. Performed toileting tasks, grooming at sink, bed mobility, donning/doffing brace, and OT provided education. Pt lethargic during session. Spoke with PT and pt will have family able to assist her at home for first few weeks. Recommending HHOT.   Follow Up Recommendations  Home Health;Supervision/Assistance - 24 hour    Barriers to Discharge       Equipment Recommendations  3 in 1 bedside comode;Other (comment) (RW)    Recommendations for Other Services    Frequency Min 2X/week   Progress towards OT Goals Progress towards OT goals: Progressing toward goals  Plan Discharge plan needs to be updated   Precautions / Restrictions Precautions Precautions: Back Precaution Booklet Issued: No Precaution Comments: Reviewed back precautions with pt-cues during session. Pt with decreased memory and could not remember precautions when asked at end of session Required Braces or Orthoses: Spinal Brace Spinal Brace: Lumbar corset;Applied in sitting position Restrictions Weight Bearing Restrictions: No   Pertinent Vitals/Pain Pain in back, but not rated. Repositioned. O2 in 80's during session on RA, placed back on O2.     ADL  Grooming: Wash/dry hands;Min guard (gave cues as pt was twisting to get paper towel) Where Assessed - Grooming: Supported standing Upper Body Dressing: Set up;Supervision/safety Where Assessed - Upper Body Dressing: Unsupported sitting Lower Body Dressing: Set up;Supervision/safety (put on slip on shoes) Where Assessed - Lower Body Dressing: Supported sitting Toilet Transfer: Min Armed forces training and education officer Method: Sit to Barista: Regular height toilet;Grab bars;Comfort height toilet Toileting - Clothing Manipulation and Hygiene: Min guard Where Assessed - Toileting Clothing Manipulation and Hygiene: Sit to stand from 3-in-1 or toilet;Sit on 3-in-1 or toilet Equipment Used: Back brace;Rolling walker;Long-handled sponge Transfers/Ambulation Related to ADLs: Min guard ADL Comments: Pt practiced simulated regular height toilet transfer as she said that is what she will be using at home. Showed pt long handled sponge for LB bathing. Pt reports family will be assisting with bathing and dressing. Pt donned brace sitting EOB with setup/supervision level-cues to tighten.  Pt sat on commode to perform hygiene but was twisting and did not allow OT to help. OT educated on use of toilet aid to assist with this. Spoke with pt about safe shoe wear. Educated on non skid mats and also use of bag on walker to carry items.     OT Diagnosis:    OT Problem List:   OT Treatment Interventions:     OT Goals(current goals can now be found in the care plan section) Acute Rehab OT Goals Patient Stated Goal: to go home OT Goal Formulation: With patient/family Time For Goal Achievement: 08/11/13 Potential to Achieve Goals: Good ADL Goals Pt Will Perform Grooming: with modified independence;standing Pt Will Perform Upper Body Bathing: with modified independence;sitting Pt Will Perform Lower Body Bathing: with modified independence;sit to/from stand Pt Will Transfer to Toilet: with modified independence;bedside commode Additional ADL Goal #1: Pt will complete bed mobility MOD I   Visit Information  Last OT Received On: 07/30/13 Assistance Needed: +1 History of Present Illness: 67 yo female s/p Decompression and fusion at L4-5 with brace    Subjective Data  Prior Functioning       Cognition  Cognition Arousal/Alertness: Lethargic Behavior During Therapy: WFL for tasks  assessed/performed Overall Cognitive Status: Within Functional Limits for tasks assessed Area of Impairment: Memory Memory: Decreased recall of precautions;Decreased short-term memory General Comments: Could not remember precautions at end of session after we went over them earlier.    Mobility  Bed Mobility Bed Mobility: Rolling Left;Left Sidelying to Sit;Rolling Right;Sit to Sidelying Left Rolling Right: 4: Min assist;4: Min guard Rolling Left: 4: Min guard Left Sidelying to Sit: 4: Min guard;HOB flat Sit to Sidelying Left: 4: Min assist Details for Bed Mobility Assistance: Min A to help leg onto bed. Cues for precautions and technique. Min A to help roll and keep body/trunk in alignment. Transfers Transfers: Sit to Stand;Stand to Sit Sit to Stand: 4: Min guard;With upper extremity assist;From bed;From chair/3-in-1;From toilet Stand to Sit: 4: Min guard;With upper extremity assist;To bed;To chair/3-in-1;To toilet Details for Transfer Assistance: cues for hand placement.     Exercises      Balance     End of Session OT - End of Session Equipment Utilized During Treatment: Rolling walker;Back brace Activity Tolerance: Patient limited by lethargy Patient left: in bed;with call bell/phone within reach;with bed alarm set  GO     Earlie Raveling OTR/L [phone] 07/30/2013, 4:46 PM

## 2013-07-30 NOTE — Progress Notes (Signed)
Patient ID: [name], female   DOB: 03-Jun-1946, 67 y.o.   MRN: [id number] Subjective:  the patient is alert and pleasant. She complains of nausea with the oxycodone and would like to change back to Norco.  Objective: Vital signs in last 24 hours: Temp:  [97.7 F (36.5 C)-98.9 F (37.2 C)] 97.7 F (36.5 C) (10/25 0521) Pulse Rate:  [107-117] 109 (10/25 0521) Resp:  [16-20] 18 (10/25 0521) BP: (100-153)/(38-77) 119/38 mmHg (10/25 0521) SpO2:  [92 %-98 %] 92 % (10/25 0521)  Intake/Output from previous day: 10/24 0701 - 10/25 0700 In: 563 [P.O.:560; I.V.:3] Out: 1152 [Urine:1150; Stool:2] Intake/Output this shift:    Physical exam patient is alert and oriented. She is moving all 4 extremities well.  Lab Results: No results found for this basename: WBC, HGB, HCT, PLT,  in the last 72 hours BMET No results found for this basename: NA, K, CL, CO2, GLUCOSE, BUN, CREATININE, CALCIUM,  in the last 72 hours  Studies/Results: No results found.  Assessment/Plan: Postop day #3: We will continue to mobilize the patient. I'll change her pain medicines to Norco. She may be able to go home tomorrow.   LOS: 3 days     [name] D 07/30/2013, 9:14 AM

## 2013-07-31 LAB — GLUCOSE, CAPILLARY: Glucose-Capillary: 273 mg/dL — ABNORMAL HIGH (ref 70–99)

## 2013-07-31 MED ORDER — DOCUSATE SODIUM 100 MG PO CAPS: 100.0000 mg | ORAL_CAPSULE | Freq: Every day | ORAL | Status: DC

## 2013-07-31 MED ORDER — DIAZEPAM 5 MG PO TABS: 5.0000 mg | ORAL_TABLET | Freq: Four times a day (QID) | ORAL | Status: DC | PRN

## 2013-07-31 MED ORDER — HYDROCODONE-ACETAMINOPHEN 10-325 MG PO TABS: 1.0000 | ORAL_TABLET | ORAL | Status: DC | PRN

## 2013-07-31 NOTE — Discharge Summary (Signed)
Physician Discharge Summary  Patient ID: [name] MRN: [id number] DOB/AGE: [birth year] 67 y.o.  Admit date: 07/27/2013 Discharge date: 07/31/2013  Admission Diagnoses: Lumbar spondylosis L4-5  Discharge Diagnoses: Same Active Problems:   * No active hospital problems. *   Discharged Condition: Stable  Hospital Course:  [name] is a 67 y.o. female electively admitted for L4-5 decompression and fusion. Postoperatively she was neurologically well, but continued to have back pain. She also had difficulty with tolerating therapy and was confused and lethargic at times. She slowly improved and was seen by PT who recommended home PT with rolling walker.  Treatments: Surgery - L4-5 decompression fusion  Discharge Exam: Blood pressure 113/55, pulse 106, temperature 98.7 F (37.1 C), temperature source Oral, resp. rate 18, height 5\' 2"  (1.575 m), weight 93.18 kg (205 lb 6.8 oz), SpO2 99.00%. Awake, alert, oriented Speech fluent, appropriate CN grossly intact 5/5 BUE/BLE Wound c/d/i  Follow-up: Follow-up in Dr. Cassandria Santee office [facility] [phone]) in 2-3 weeks  Disposition: 01-Home or Self Care  Discharge Orders   Future Appointments Provider Department Dept Phone   11/02/2013 9:00 AM [facility]ell [facility] MEDICAL ONCOLOGY [phone]   11/02/2013 10:00 AM [facility] [phone]   Patient to arrive 15 minutes prior to appointment time.   11/03/2013 9:00 AM Si Gaul, MD [facility] MEDICAL ONCOLOGY [phone]   Future Orders Complete By Expires   Face-to-face encounter (required for Medicare/Medicaid patients)  As directed    Questions:     The encounter with the patient was in whole, or in part, for the following medical condition, which is the primary reason for home health care:  Lumbar spondylosis   I certify that, based on my findings, the following  services are medically necessary home health services:  Physical therapy   My clinical findings support the need for the above services:  Pain interferes with ambulation/mobility   Further, I certify that my clinical findings support that this patient is homebound due to:  Pain interferes with ambulation/mobility   Reason for Medically Necessary Home Health Services:  Therapy- Therapeutic Exercises to Increase Strength and Endurance   For home use only DME Walker rolling  As directed    Home Health  As directed    Questions:     To provide the following care/treatments:  PT       Medication List         albuterol-ipratropium 18-103 MCG/ACT inhaler  Commonly known as:  COMBIVENT  Inhale 2 puffs into the lungs as needed for wheezing or shortness of breath.     diazepam 5 MG tablet  Commonly known as:  VALIUM  Take 1 tablet (5 mg total) by mouth every 6 (six) hours as needed.     esomeprazole 40 MG capsule  Commonly known as:  NEXIUM  Take 40 mg by mouth 2 (two) times daily as needed (for heartburm/ acid reflux).     fexofenadine-pseudoephedrine 180-240 MG per 24 hr tablet  Commonly known as:  ALLEGRA-D 24  Take 1 tablet by mouth daily as needed (allergies).     HYDROcodone-acetaminophen 10-325 MG per tablet  Commonly known as:  NORCO  Take 1-2 tablets by mouth every 4 (four) hours as needed.     ibuprofen 200 MG tablet  Commonly known as:  ADVIL,MOTRIN  Take 400 mg by mouth every 6 (six) hours as needed for pain.  insulin aspart 100 UNIT/ML injection  Commonly known as:  novoLOG  Inject 10-25 Units into the skin 3 (three) times daily before meals. Units given depends on her CBG readings. < 200 gives 10 units, > 200 gives 20 units, 20-25 units at supper depending on CBG reading     insulin glargine 100 UNIT/ML injection  Commonly known as:  LANTUS  Inject 50 Units into the skin at bedtime.     ipratropium 0.02 % nebulizer solution  Commonly known as:  ATROVENT  Take  500 mcg by nebulization every 6 (six) hours as needed for wheezing.     PROAIR HFA 108 (90 BASE) MCG/ACT inhaler  Generic drug:  albuterol  Inhale 1-2 puffs into the lungs every 6 (six) hours as needed. For shortness of breath/wheezing     simvastatin 40 MG tablet  Commonly known as:  ZOCOR  Take 40 mg by mouth every evening.     SYSTANE 0.4-0.3 % Soln  Generic drug:  Polyethyl Glycol-Propyl Glycol  Apply 1 drop to eye 2 (two) times daily.     Vitamin D 1000 UNITS capsule  Take 1,000 Units by mouth every evening.         SignedLisbeth Renshaw, C 07/31/2013, 3:41 PM

## 2013-07-31 NOTE — Progress Notes (Signed)
Patient discharge paperwork and education complete.  Prescription for pain medication given to patient.  No IV.  Lance Bosch, RN

## 2013-07-31 NOTE — Social Work (Signed)
Referred to CSW ?SNF. Chart reviewed and have noted PT recommendations and plans for d/c to home today. CSW to sign off- please contact us if SW needs arise. Reece Levy, MSW, [facility] [phone]

## 2013-07-31 NOTE — Care Management Note (Unsigned)
    Page 1 of 1   07/31/2013     7:12:16 PM   CARE MANAGEMENT NOTE 07/31/2013  Patient:  [name], [name]   Account Number:  [id number]  Date Initiated:  07/31/2013  Documentation initiated by:  [facility]  Subjective/Objective Assessment:   adm: Lumbar spondylosis L4-5     Action/Plan:   discharge   Anticipated DC Date:  07/31/2013   Anticipated DC Plan:  HOME W HOME HEALTH SERVICES      DC Planning Services  CM consult      Choice offered to / List presented to:          [facility] arranged  HH-2 PT      [facility] agency  [facility]   Status of service:  Completed, signed off Medicare Important Message given?   (If response is "NO", the following Medicare IM given date fields will be blank) Date Medicare IM given:   Date Additional Medicare IM given:    Discharge Disposition:  HOME W HOME HEALTH SERVICES  Per UR Regulation:    If discussed at Long Length of Stay Meetings, dates discussed:    Comments:  07/31/13 19:07 RN called to inform CM pt went home at 17:00 and wanted her PT to be set up with [facility].  HHPT/OT as recc. Referral faxed to [facility]. No other needs were communicated. Freddy Jaksch, Georgiann Mohs [phone].

## 2013-07-31 NOTE — Progress Notes (Addendum)
Physical Therapy Treatment Patient Details Name: [name] MRN: [id number] DOB: 1946-05-04 Today's Date: 07/31/2013 Time: 4098-1191 PT Time Calculation (min): 24 min  PT Assessment / Plan / Recommendation  History of Present Illness 67 yo female s/p Decompression and fusion at L4-5 with brace   PT Comments   Patient improved with mobility.  Able to ambulate 38' today with RW.  Provided patient and family with education on positioning, moving every hour, donning brace, etc.  Will need HHPT for continued therapy and RW at discharge.  MD* Please order if you agree.  Follow Up Recommendations  Home health PT;Supervision/Assistance - 24 hour     Does the patient have the potential to tolerate intense rehabilitation     Barriers to Discharge        Equipment Recommendations  Rolling walker with 5" wheels    Recommendations for Other Services    Frequency Min 5X/week   Progress towards PT Goals Progress towards PT goals: Progressing toward goals  Plan Current plan remains appropriate    Precautions / Restrictions Precautions Precautions: Back Precaution Comments: Reviewed back precautions Required Braces or Orthoses: Spinal Brace Spinal Brace: Lumbar corset;Applied in sitting position Restrictions Weight Bearing Restrictions: No   Pertinent Vitals/Pain Pain 10/10 during gait.  RN notified and provided meds.    Mobility  Bed Mobility Bed Mobility: Sit to Sidelying Left;Rolling Right Rolling Right: 7: Independent Sit to Sidelying Left: 4: Min guard;HOB flat Details for Bed Mobility Assistance: Reviewed technique for moving sit to sidelying.  Removed rail and placed bed flat.  Patient able to perform with min guard assist for safety only.  Educated family on assisting patient with bringing LE's onto bed if needed.  Patient then able to move to back independently.  Rolled to right side and positioned with pillow between knees. Transfers Transfers: Sit to Stand;Stand to  Sit Sit to Stand: 4: Min guard;With upper extremity assist;From bed Stand to Sit: 5: Supervision;With upper extremity assist;To bed Details for Transfer Assistance: cues for hand placement.  Ambulation/Gait Ambulation/Gait Assistance: 4: Min guard Ambulation Distance (Feet): 118 Feet Assistive device: Rolling walker Ambulation/Gait Assistance Details: Patient demonstrated safe use of RW.  Cues to stand upright and look forward. Gait Pattern: Step-through pattern;Decreased stride length Gait velocity: decreased      PT Goals (current goals can now be found in the care plan section)    Visit Information  Last PT Received On: 07/31/13 Assistance Needed: +1 History of Present Illness: 67 yo female s/p Decompression and fusion at L4-5 with brace    Subjective Data  Subjective: "I'm hurting right now"   Cognition  Cognition Arousal/Alertness: Awake/alert Behavior During Therapy: WFL for tasks assessed/performed Overall Cognitive Status: Within Functional Limits for tasks assessed    Balance     End of Session PT - End of Session Equipment Utilized During Treatment: Gait belt;Back brace Activity Tolerance: Patient limited by pain Patient left: in bed;with call bell/phone within reach;with family/visitor present Nurse Communication: Mobility status   GP     Vena Austria 07/31/2013, 2:13 PM Durenda Hurt. Renaldo Fiddler, [facility] Acute Rehab Services Pager [phone]

## 2013-08-11 ENCOUNTER — Other Ambulatory Visit: Payer: Self-pay

## 2013-10-04 ENCOUNTER — Other Ambulatory Visit (HOSPITAL_COMMUNITY): Payer: Self-pay | Admitting: Family Medicine

## 2013-10-04 ENCOUNTER — Ambulatory Visit (HOSPITAL_COMMUNITY)
Admission: RE | Admit: 2013-10-04 | Discharge: 2013-10-04 | Disposition: A | Discharge status: Home / Self Care | Payer: Medicare Other | Source: Ambulatory Visit | Attending: Family Medicine | Admitting: Family Medicine

## 2013-10-04 DIAGNOSIS — R05 Cough: Secondary | ICD-10-CM | POA: Insufficient documentation

## 2013-10-04 DIAGNOSIS — Z85118 Personal history of other malignant neoplasm of bronchus and lung: Secondary | ICD-10-CM

## 2013-10-04 DIAGNOSIS — J984 Other disorders of lung: Secondary | ICD-10-CM | POA: Insufficient documentation

## 2013-10-04 DIAGNOSIS — Z902 Acquired absence of lung [part of]: Secondary | ICD-10-CM | POA: Insufficient documentation

## 2013-10-04 DIAGNOSIS — R059 Cough, unspecified: Secondary | ICD-10-CM | POA: Insufficient documentation

## 2013-10-10 ENCOUNTER — Ambulatory Visit (HOSPITAL_COMMUNITY)
Admission: RE | Admit: 2013-10-10 | Discharge: 2013-10-10 | Disposition: A | Discharge status: Home / Self Care | Payer: Medicare Other | Source: Ambulatory Visit | Attending: Family Medicine | Admitting: Family Medicine

## 2013-10-10 ENCOUNTER — Other Ambulatory Visit (HOSPITAL_COMMUNITY): Payer: Self-pay | Admitting: Family Medicine

## 2013-10-10 DIAGNOSIS — J189 Pneumonia, unspecified organism: Secondary | ICD-10-CM

## 2013-10-10 DIAGNOSIS — Z09 Encounter for follow-up examination after completed treatment for conditions other than malignant neoplasm: Secondary | ICD-10-CM | POA: Insufficient documentation

## 2013-11-02 ENCOUNTER — Ambulatory Visit (HOSPITAL_COMMUNITY)
Admission: RE | Admit: 2013-11-02 | Discharge: 2013-11-02 | Disposition: A | Discharge status: Home / Self Care | Payer: Medicare Other | Source: Ambulatory Visit | Attending: Internal Medicine | Admitting: Internal Medicine

## 2013-11-02 ENCOUNTER — Encounter (HOSPITAL_COMMUNITY): Payer: Self-pay

## 2013-11-02 ENCOUNTER — Other Ambulatory Visit (HOSPITAL_BASED_OUTPATIENT_CLINIC_OR_DEPARTMENT_OTHER): Payer: Medicare Other

## 2013-11-02 DIAGNOSIS — C3490 Malignant neoplasm of unspecified part of unspecified bronchus or lung: Secondary | ICD-10-CM | POA: Insufficient documentation

## 2013-11-02 DIAGNOSIS — J8410 Pulmonary fibrosis, unspecified: Secondary | ICD-10-CM | POA: Insufficient documentation

## 2013-11-02 DIAGNOSIS — Z9221 Personal history of antineoplastic chemotherapy: Secondary | ICD-10-CM | POA: Insufficient documentation

## 2013-11-02 DIAGNOSIS — J701 Chronic and other pulmonary manifestations due to radiation: Secondary | ICD-10-CM | POA: Insufficient documentation

## 2013-11-02 DIAGNOSIS — K2289 Other specified disease of esophagus: Secondary | ICD-10-CM | POA: Insufficient documentation

## 2013-11-02 DIAGNOSIS — C3410 Malignant neoplasm of upper lobe, unspecified bronchus or lung: Secondary | ICD-10-CM

## 2013-11-02 DIAGNOSIS — I2510 Atherosclerotic heart disease of native coronary artery without angina pectoris: Secondary | ICD-10-CM | POA: Insufficient documentation

## 2013-11-02 DIAGNOSIS — R918 Other nonspecific abnormal finding of lung field: Secondary | ICD-10-CM | POA: Insufficient documentation

## 2013-11-02 DIAGNOSIS — E0789 Other specified disorders of thyroid: Secondary | ICD-10-CM | POA: Insufficient documentation

## 2013-11-02 DIAGNOSIS — K228 Other specified diseases of esophagus: Secondary | ICD-10-CM | POA: Insufficient documentation

## 2013-11-02 DIAGNOSIS — Y842 Radiological procedure and radiotherapy as the cause of abnormal reaction of the patient, or of later complication, without mention of misadventure at the time of the procedure: Secondary | ICD-10-CM | POA: Insufficient documentation

## 2013-11-02 LAB — CBC WITH DIFFERENTIAL/PLATELET
BASO%: 0.8 % (ref 0.0–2.0)
BASOS ABS: 0.1 10*3/uL (ref 0.0–0.1)
EOS%: 3.3 % (ref 0.0–7.0)
Eosinophils Absolute: 0.2 10*3/uL (ref 0.0–0.5)
HEMATOCRIT: 43.5 % (ref 34.8–46.6)
HEMOGLOBIN: 14.4 g/dL (ref 11.6–15.9)
LYMPH#: 2 10*3/uL (ref 0.9–3.3)
LYMPH%: 29.6 % (ref 14.0–49.7)
MCH: 26 pg (ref 25.1–34.0)
MCHC: 33.2 g/dL (ref 31.5–36.0)
MCV: 78.3 fL — ABNORMAL LOW (ref 79.5–101.0)
MONO#: 0.4 10*3/uL (ref 0.1–0.9)
MONO%: 6.3 % (ref 0.0–14.0)
NEUT%: 60 % (ref 38.4–76.8)
NEUTROS ABS: 4 10*3/uL (ref 1.5–6.5)
Platelets: 123 10*3/uL — ABNORMAL LOW (ref 145–400)
RBC: 5.56 10*6/uL — ABNORMAL HIGH (ref 3.70–5.45)
RDW: 15.3 % — ABNORMAL HIGH (ref 11.2–14.5)
WBC: 6.7 10*3/uL (ref 3.9–10.3)

## 2013-11-02 LAB — COMPREHENSIVE METABOLIC PANEL (CC13)
ALT: 9 U/L (ref 0–55)
AST: 12 U/L (ref 5–34)
Albumin: 3.9 g/dL (ref 3.5–5.0)
Alkaline Phosphatase: 107 U/L (ref 40–150)
Anion Gap: 10 mEq/L (ref 3–11)
BUN: 8.6 mg/dL (ref 7.0–26.0)
CALCIUM: 9.6 mg/dL (ref 8.4–10.4)
CHLORIDE: 104 meq/L (ref 98–109)
CO2: 26 meq/L (ref 22–29)
CREATININE: 0.8 mg/dL (ref 0.6–1.1)
Glucose: 178 mg/dl — ABNORMAL HIGH (ref 70–140)
Potassium: 4.5 mEq/L (ref 3.5–5.1)
Sodium: 141 mEq/L (ref 136–145)
Total Bilirubin: 0.5 mg/dL (ref 0.20–1.20)
Total Protein: 7.4 g/dL (ref 6.4–8.3)

## 2013-11-02 MED ORDER — IOHEXOL 300 MG/ML  SOLN
100.0000 mL | Freq: Once | INTRAMUSCULAR | Status: AC | PRN
  Administered 2013-11-02: 100 mL via INTRAVENOUS

## 2013-11-03 ENCOUNTER — Encounter: Payer: Self-pay | Admitting: Internal Medicine

## 2013-11-03 ENCOUNTER — Telehealth: Payer: Self-pay | Admitting: Internal Medicine

## 2013-11-03 ENCOUNTER — Ambulatory Visit (HOSPITAL_BASED_OUTPATIENT_CLINIC_OR_DEPARTMENT_OTHER): Payer: Medicare Other | Admitting: Internal Medicine

## 2013-11-03 VITALS — BP 177/89 | HR 98 | Temp 97.4°F | Resp 20 | Ht 62.0 in | Wt 194.0 lb

## 2013-11-03 DIAGNOSIS — C3410 Malignant neoplasm of upper lobe, unspecified bronchus or lung: Secondary | ICD-10-CM

## 2013-11-03 DIAGNOSIS — R0609 Other forms of dyspnea: Secondary | ICD-10-CM

## 2013-11-03 DIAGNOSIS — R0989 Other specified symptoms and signs involving the circulatory and respiratory systems: Secondary | ICD-10-CM

## 2013-11-03 DIAGNOSIS — R05 Cough: Secondary | ICD-10-CM

## 2013-11-03 DIAGNOSIS — R059 Cough, unspecified: Secondary | ICD-10-CM

## 2013-11-03 DIAGNOSIS — C3490 Malignant neoplasm of unspecified part of unspecified bronchus or lung: Secondary | ICD-10-CM

## 2013-11-03 NOTE — Telephone Encounter (Signed)
gv and printed aptp sched and avs for pt for July adn Aug

## 2013-11-03 NOTE — Patient Instructions (Signed)
Smoking Cessation Quitting smoking is important to your health and has many advantages. However, it is not always easy to quit since nicotine is a very addictive drug. Often times, people try 3 times or more before being able to quit. This document explains the best ways for you to prepare to quit smoking. Quitting takes hard work and a lot of effort, but you can do it. ADVANTAGES OF QUITTING SMOKING  You will live longer, feel better, and live better.  Your body will feel the impact of quitting smoking almost immediately.  Within 20 minutes, blood pressure decreases. Your pulse returns to its normal level.  After 8 hours, carbon monoxide levels in the blood return to normal. Your oxygen level increases.  After 24 hours, the chance of having a heart attack starts to decrease. Your breath, hair, and body stop smelling like smoke.  After 48 hours, damaged nerve endings begin to recover. Your sense of taste and smell improve.  After 72 hours, the body is virtually free of nicotine. Your bronchial tubes relax and breathing becomes easier.  After 2 to 12 weeks, lungs can hold more air. Exercise becomes easier and circulation improves.  The risk of having a heart attack, stroke, cancer, or lung disease is greatly reduced.  After 1 year, the risk of coronary heart disease is cut in half.  After 5 years, the risk of stroke falls to the same as a nonsmoker.  After 10 years, the risk of lung cancer is cut in half and the risk of other cancers decreases significantly.  After 15 years, the risk of coronary heart disease drops, usually to the level of a nonsmoker.  If you are pregnant, quitting smoking will improve your chances of having a healthy baby.  The people you live with, especially any children, will be healthier.  You will have extra money to spend on things other than cigarettes. QUESTIONS TO THINK ABOUT BEFORE ATTEMPTING TO QUIT You may want to talk about your answers with your  caregiver.  Why do you want to quit?  If you tried to quit in the past, what helped and what did not?  What will be the most difficult situations for you after you quit? How will you plan to handle them?  Who can help you through the tough times? Your family? Friends? A caregiver?  What pleasures do you get from smoking? What ways can you still get pleasure if you quit? Here are some questions to ask your caregiver:  How can you help me to be successful at quitting?  What medicine do you think would be best for me and how should I take it?  What should I do if I need more help?  What is smoking withdrawal like? How can I get information on withdrawal? GET READY  Set a quit date.  Change your environment by getting rid of all cigarettes, ashtrays, matches, and lighters in your home, car, or work. Do not let people smoke in your home.  Review your past attempts to quit. Think about what worked and what did not. GET SUPPORT AND ENCOURAGEMENT You have a better chance of being successful if you have help. You can get support in many ways.  Tell your family, friends, and co-workers that you are going to quit and need their support. Ask them not to smoke around you.  Get individual, group, or telephone counseling and support. Programs are available at local hospitals and health centers. Call your local health department for   information about programs in your area.  Spiritual beliefs and practices may help some smokers quit.  Download a "quit meter" on your computer to keep track of quit statistics, such as how long you have gone without smoking, cigarettes not smoked, and money saved.  Get a self-help book about quitting smoking and staying off of tobacco. LEARN NEW SKILLS AND BEHAVIORS  Distract yourself from urges to smoke. Talk to someone, go for a walk, or occupy your time with a task.  Change your normal routine. Take a different route to work. Drink tea instead of coffee.  Eat breakfast in a different place.  Reduce your stress. Take a hot bath, exercise, or read a book.  Plan something enjoyable to do every day. Reward yourself for not smoking.  Explore interactive web-based programs that specialize in helping you quit. GET MEDICINE AND USE IT CORRECTLY Medicines can help you stop smoking and decrease the urge to smoke. Combining medicine with the above behavioral methods and support can greatly increase your chances of successfully quitting smoking.  Nicotine replacement therapy helps deliver nicotine to your body without the negative effects and risks of smoking. Nicotine replacement therapy includes nicotine gum, lozenges, inhalers, nasal sprays, and skin patches. Some may be available over-the-counter and others require a prescription.  Antidepressant medicine helps people abstain from smoking, but how this works is unknown. This medicine is available by prescription.  Nicotinic receptor partial agonist medicine simulates the effect of nicotine in your brain. This medicine is available by prescription. Ask your caregiver for advice about which medicines to use and how to use them based on your health history. Your caregiver will tell you what side effects to look out for if you choose to be on a medicine or therapy. Carefully read the information on the package. Do not use any other product containing nicotine while using a nicotine replacement product.  RELAPSE OR DIFFICULT SITUATIONS Most relapses occur within the first 3 months after quitting. Do not be discouraged if you start smoking again. Remember, most people try several times before finally quitting. You may have symptoms of withdrawal because your body is used to nicotine. You may crave cigarettes, be irritable, feel very hungry, cough often, get headaches, or have difficulty concentrating. The withdrawal symptoms are only temporary. They are strongest when you first quit, but they will go away within  10 14 days. To reduce the chances of relapse, try to:  Avoid drinking alcohol. Drinking lowers your chances of successfully quitting.  Reduce the amount of caffeine you consume. Once you quit smoking, the amount of caffeine in your body increases and can give you symptoms, such as a rapid heartbeat, sweating, and anxiety.  Avoid smokers because they can make you want to smoke.  Do not let weight gain distract you. Many smokers will gain weight when they quit, usually less than 10 pounds. Eat a healthy diet and stay active. You can always lose the weight gained after you quit.  Find ways to improve your mood other than smoking. FOR MORE INFORMATION  www.smokefree.gov  Document Released: 09/16/2001 Document Revised: 03/23/2012 Document Reviewed: 01/01/2012 [employer] Patient Information 2014 [employer].  

## 2013-11-03 NOTE — Progress Notes (Signed)
[facility] [phone]   [phone]  OFFICE PROGRESS NOTE  [name], MD [place] [address]  DIAGNOSIS: Stage IIIA (T2a N2 MX) non-small cell lung cancer, adenocarcinoma, diagnosed in October 2009.   PRIOR THERAPY: :  1. Status post right upper lobectomy with lymph node dissection under the care of Dr. Arlyce Dice on October 12, 2008. 2. Status post 4 cycles of adjuvant chemotherapy with cisplatin and docetaxel given every 3 weeks with Neulasta support. Last dose was given on January 23, 2009. 3. Status post adjuvant radiotherapy to the mediastinum. The patient received a total dose of 5040 cGy between March 06, 2009, through April 07, 2009, under the care of Dr. Pablo Ledger.  CURRENT THERAPY: Observation.   CHEMOTHERAPY INTENT: curative  CURRENT # OF CHEMOTHERAPY CYCLES: 0  CURRENT ANTIEMETICS: Compazine  CURRENT SMOKING STATUS: Current smoker. I strongly advise her to quit smoking and offered her smoke cessation program. She was also given him the phone number for the quick now program  ORAL CHEMOTHERAPY AND CONSENT: None  CURRENT BISPHOSPHONATES USE: None  PAIN MANAGEMENT: No Pain.  NARCOTICS INDUCED CONSTIPATION: N/A  LIVING WILL AND CODE STATUS: no CODE BLUE   INTERVAL HISTORY: [name] 68 y.o. female returns to the clinic today for six-month follow up visit accompanied by her daughter . The patient is feeling fine today with no specific complaints except for mild cough as well as shortness breath with exertion. Unfortunately she continues to smoke but she is trying to quit on her own. She denied having any significant chest pain or hemoptysis. She has no nausea or vomiting. She intentionally lost around 7 pounds since her last visit.The patient has repeat CT scan of the chest performed recently and she is here for evaluation and discussion of her scan results.  MEDICAL HISTORY: Past Medical History  Diagnosis  Date  . Asthma   . Cholelithiasis   . Colon polyps   . Coronary artery disease     The pt had stenting of the midsegment of the right coronary artery in 2005 with the Cypher stent. The last catheterization demonstrated an EF of 60%. The right coronary artery stent was patent. The left  main was normal. Circumflex had luminal irregularities. The LAD had luminal irregularities.   . Diabetes mellitus     Type II  . GERD (gastroesophageal reflux disease)   . Hyperlipidemia   . Lung cancer 2010    Adenocarcinoma, right lung, node positive  . Tobacco abuse   . Bronchitis   . Shortness of breath     with ambulation  . Arthritis   . Kidney stones     hx of  . Urgency of urination   . COPD (chronic obstructive pulmonary disease)     USES O2 AT NIGHT + PRN IN DAYTIME3L  . Anginal pain     PATIENT STATES DR. HOCHREIN STATED WAS FINE ON EXAM  . Emphysema   . Pneumonia   . H/O hiatal hernia   . Headache(784.0)   . Fibromyalgia   . Complication of anesthesia     no complication, but patient states no tube can be placed in her right nares due to narrowing from fracture    ALLERGIES:  is allergic to celebrex; niacin; and varenicline tartrate.  MEDICATIONS:  Current Outpatient Prescriptions  Medication Sig Dispense Refill  . albuterol-ipratropium (COMBIVENT) 18-103 MCG/ACT inhaler Inhale 2 puffs into the lungs as needed for wheezing  or shortness of breath.       . Cholecalciferol (VITAMIN D) 1000 UNITS capsule Take 1,000 Units by mouth every evening.       [name] esomeprazole (NEXIUM) 40 MG capsule Take 40 mg by mouth 2 (two) times daily as needed (for heartburm/ acid reflux).      . fexofenadine-pseudoephedrine (ALLEGRA-D 24) 180-240 MG per 24 hr tablet Take 1 tablet by mouth daily as needed (allergies).       [name] HYDROcodone-acetaminophen (NORCO) 10-325 MG per tablet Take 1-2 tablets by mouth every 4 (four) hours as needed.  30 tablet  0  . ibuprofen (ADVIL,MOTRIN) 200 MG tablet Take 400 mg by  mouth every 6 (six) hours as needed for pain.      [name] insulin aspart (NOVOLOG) 100 UNIT/ML injection Inject 10-25 Units into the skin 3 (three) times daily before meals. Units given depends on her CBG readings. < 200 gives 10 units, > 200 gives 20 units, 20-25 units at supper depending on CBG reading      . insulin glargine (LANTUS) 100 UNIT/ML injection Inject 50 Units into the skin at bedtime.       [name] ipratropium (ATROVENT) 0.02 % nebulizer solution Take 500 mcg by nebulization every 6 (six) hours as needed for wheezing.       Vladimir Faster Glycol-Propyl Glycol (SYSTANE) 0.4-0.3 % SOLN Apply 1 drop to eye 2 (two) times daily.       [name] PROAIR HFA 108 (90 BASE) MCG/ACT inhaler Inhale 1-2 puffs into the lungs every 6 (six) hours as needed. For shortness of breath/wheezing      . simvastatin (ZOCOR) 40 MG tablet Take 40 mg by mouth every evening.       No current facility-administered medications for this visit.    SURGICAL HISTORY:  Past Surgical History  Procedure Laterality Date  . Appendectomy  1970  . Gallbladder surgery  1989  . Abdominal hysterectomy  1972  . Tonsillectomy  1966  . Carpal tunnel release    . Replacement total knee  2001    left  . Neck surgery  2005  . Pneumonectomy      Right Partial, lung cancer confirmed, nod positive  . Right upper lobectomy with lymph node dissection  10/12/2008    Dr Arlyce Dice  . Cardiac catheterization    . Coronary angioplasty  2005  . Portacath placement  12/12/2007  . Port-a-cath removal  05/12/2012    Procedure: REMOVAL PORT-A-CATH;  Surgeon: Melrose Nakayama, MD;  Location: [facility];  Service: Thoracic;  Laterality: Left;  . Back surgery  2007  . Eye surgery      implants 02/18/12 (Left) 02/25/12 (right eye)  . Joint replacement    . Rotator cuff repair      RIGHT SHOULDER  2 YRS  . Orif humerus fracture Left 12/14/2012    Procedure: OPEN REDUCTION INTERNAL FIXATION (ORIF) LEFT PROXIMAL HUMERUS FRACTURE;  Surgeon: Mcarthur Rossetti, MD;   Location: [facility];  Service: Orthopedics;  Laterality: Left;  . Cholecystectomy      REVIEW OF SYSTEMS:  A comprehensive review of systems was negative except for: Respiratory: positive for dyspnea on exertion   PHYSICAL EXAMINATION: General appearance: alert, cooperative and no distress Head: Normocephalic, without obvious abnormality, atraumatic Neck: no adenopathy Lymph nodes: Cervical, supraclavicular, and axillary nodes normal. Resp: clear to auscultation bilaterally Cardio: regular rate and rhythm, S1, S2 normal, no murmur, click, rub or gallop GI: soft, non-tender; bowel sounds normal; no  masses,  no organomegaly Extremities: extremities normal, atraumatic, no cyanosis or edema  ECOG PERFORMANCE STATUS: 1 - Symptomatic but completely ambulatory  Blood pressure 177/89, pulse 98, temperature 97.4 F (36.3 C), temperature source Oral, resp. rate 20, height 5\' 2"  (1.575 m), weight 194 lb (87.998 kg), SpO2 100.00%.  LABORATORY DATA: Lab Results  Component Value Date   WBC 6.7 11/02/2013   HGB 14.4 11/02/2013   HCT 43.5 11/02/2013   MCV 78.3* 11/02/2013   PLT 123* 11/02/2013      Chemistry      Component Value Date/Time   NA 141 11/02/2013 0849   NA 140 07/22/2013 1049   NA 139 03/23/2012 0913   K 4.5 11/02/2013 0849   K 4.0 07/22/2013 1049   K 5.1* 03/23/2012 0913   CL 101 07/22/2013 1049   CL 106 10/08/2012 0958   CL 100 03/23/2012 0913   CO2 26 11/02/2013 0849   CO2 27 07/22/2013 1049   CO2 30 03/23/2012 0913   BUN 8.6 11/02/2013 0849   BUN 10 07/22/2013 1049   BUN 11 03/23/2012 0913   CREATININE 0.8 11/02/2013 0849   CREATININE 0.66 07/22/2013 1049   CREATININE 0.9 03/23/2012 0913      Component Value Date/Time   CALCIUM 9.6 11/02/2013 0849   CALCIUM 9.2 07/22/2013 1049   CALCIUM 8.8 03/23/2012 0913   ALKPHOS 107 11/02/2013 0849   ALKPHOS 106 05/10/2012 1242   ALKPHOS 98* 03/23/2012 0913   AST 12 11/02/2013 0849   AST 13 05/10/2012 1242   AST 15 03/23/2012 0913   ALT 9 11/02/2013  0849   ALT 9 05/10/2012 1242   ALT 20 03/23/2012 0913   BILITOT 0.50 11/02/2013 0849   BILITOT 0.3 05/10/2012 1242   BILITOT 0.70 03/23/2012 0913       RADIOGRAPHIC STUDIES:  Dg Chest 2 View  10/10/2013   CLINICAL DATA:  Right lung pneumonia.  EXAM: CHEST  2 VIEW  COMPARISON:  10/04/2013  FINDINGS: The pneumonia at the right lung base has completely cleared. The patient has had a prior right upper lobectomy and has stable scarring in the hilum and right apex. Left lung is clear. Heart size and vascularity are normal. No acute osseous abnormality.  IMPRESSION: Complete clearing of the pneumonia at the right lung base.   Electronically Signed   By: Rozetta Nunnery M.D.   On: 10/10/2013 09:00   Dg Chest 2 View  10/04/2013   CLINICAL DATA:  Cough, history of lung cancer. 1/3 of right lung removed.  EXAM: CHEST  2 VIEW  COMPARISON:  07/22/2013 and 08/31/2012  FINDINGS: Exam demonstrates continued volume loss of the right lung from partial pneumonectomy. There is stable paramediastinal scarring within the right thorax. There is a new region of airspace density over the right base suggesting infection. No evidence of effusion. Subtle stable increased parenchymal markings in the left base. Cardiomediastinal silhouette and remainder of the exam is unchanged.  IMPRESSION: New airspace process over the right base suggesting infection.  Stable chronic and postsurgical changes of the right thorax. Stable increased parenchymal markings in the left base.   Electronically Signed   By: Marin Olp M.D.   On: 10/04/2013 10:46   Ct Chest W Contrast  11/02/2013   CLINICAL DATA:  Lung cancer followup.  Chemotherapy complete.  EXAM: CT CHEST WITH CONTRAST  TECHNIQUE: Multidetector CT imaging of the chest was performed during intravenous contrast administration.  CONTRAST:  175mL OMNIPAQUE IOHEXOL 300 MG/ML  SOLN  COMPARISON:  DG CHEST 2 VIEW dated 10/10/2013; CT CHEST W/CM dated 04/21/2013; CT CHEST W/CM dated 10/08/2012; CT CHEST  W/CM dated 03/23/2012  FINDINGS: Distal esophageal wall thickening, similar to the prior exam. Three-vessel coronary artery calcification. Dense mitral annulus calcification. Heart size normal. No pericardial effusion. No pathologically enlarged mediastinal, hilar or axillary lymph nodes. Calcification and sub cm low-attenuation lesion in the thyroid again noted.  Postoperative changes of right upper lobectomy with radiation fibrosis and volume loss in the medial right hemi thorax, as before. Scattered small ground-glass nodular densities in the right middle and right lower lobes (example image 17), as before. 6 mm right lower lobe ground-glass nodule, described on the prior exam, not readily appreciated. Scattered ground-glass and mosaic attenuation in the lower lung zones, as before. Calcified granuloma in the left upper lobe. No pleural fluid. Airway is otherwise unremarkable.  Incidental imaging of the upper abdomen shows no acute findings. No worrisome lytic or sclerotic lesions. Degenerative changes are seen in the spine. Thoracotomy changes on the right. Lower cervical fusion, incidentally noted.  IMPRESSION: 1. Postoperative changes of right upper lobectomy with radiation fibrosis and volume loss in the right hemi thorax, stable. No evidence of recurrent or metastatic disease. 2. Scattered small ground-glass nodular densities in the right middle and right lower lobes, similar to the prior examination. Continued attention on followup exams is warranted. 3. Mosaic pulmonary parenchymal attenuation at the lung bases is unchanged and nonspecific. Finding can be seen in small airways disease. 4. Distal esophageal wall thickening, stable, possibly related to radiation therapy. Gastroesophageal reflux disease can also have this appearance. 5. Three-vessel coronary artery calcification.   Electronically Signed   By: Lorin Picket M.D.   On: 11/02/2013 12:02    ASSESSMENT AND PLAN: this is a very pleasant 68  years old white female with history of stage IIIA non-small cell lung cancer status post right upper lobectomy followed by 4 cycles of adjuvant chemotherapy and adjuvant radiotherapy to the mediastinum. She has been observation since June of 2010 with no evidence for disease recurrence. I discussed the scan results with the patient and her daughter today.  I recommended for her to continue on observation with repeat CT scan of the chest in 6 months. I strongly encouraged the patient to quit smoking and offered her smoke cessation program. She was advised to call immediately she has any concerning symptoms in the interval.  The patient voices understanding of current disease status and treatment options and is in agreement with the current care plan.  All questions were answered. The patient knows to call the clinic with any problems, questions or concerns. We can certainly see the patient much sooner if necessary.  Disclaimer: This note was dictated with voice recognition software. Similar sounding words can inadvertently be transcribed and may not be corrected upon review.

## 2013-11-10 ENCOUNTER — Encounter (INDEPENDENT_AMBULATORY_CARE_PROVIDER_SITE_OTHER): Payer: Self-pay | Admitting: Surgery

## 2013-11-10 ENCOUNTER — Ambulatory Visit (INDEPENDENT_AMBULATORY_CARE_PROVIDER_SITE_OTHER): Payer: Medicare Other | Admitting: Surgery

## 2013-11-10 VITALS — BP 172/103 | HR 71 | Temp 98.1°F | Resp 18 | Ht 62.0 in | Wt 195.8 lb

## 2013-11-10 DIAGNOSIS — R2241 Localized swelling, mass and lump, right lower limb: Secondary | ICD-10-CM

## 2013-11-10 DIAGNOSIS — R229 Localized swelling, mass and lump, unspecified: Secondary | ICD-10-CM

## 2013-11-10 NOTE — Progress Notes (Signed)
Subjective:     Patient ID: [name], female   DOB: [birth year], 68 y.o.   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 She is well-known to me as I took care of her husband. She presents with a tender subcutaneous nodule on her right lateral posterior thigh. It has been present for approximately 3 years but is now getting larger and causing her discomfort  Review of Systems     Objective:   Physical Exam On exam, there is a mobile subcutaneous cyst approximately 3 cm in size. There are no skin changes.    Assessment:     Right lateral thigh subcutaneous mass     Plan:     As it is getting larger and uncomfortable, excision is recommended. I discussed this with her in detail and she wants to proceed with surgery.  Surgery will be scheduled

## 2013-11-16 ENCOUNTER — Telehealth (INDEPENDENT_AMBULATORY_CARE_PROVIDER_SITE_OTHER): Payer: Self-pay | Admitting: General Surgery

## 2013-11-16 ENCOUNTER — Encounter (HOSPITAL_BASED_OUTPATIENT_CLINIC_OR_DEPARTMENT_OTHER): Payer: Self-pay | Admitting: *Deleted

## 2013-11-16 NOTE — Telephone Encounter (Signed)
LMOM for patient to call back and ask for [name] 

## 2013-11-16 NOTE — Progress Notes (Signed)
Notified Dr.Blackman's scheduler pt is cancelled for surgery here.

## 2013-11-16 NOTE — Progress Notes (Signed)
[facility] for notes on last office visit, any testing - last visit found for 2008. Reviewed history with Dr. Al Corpus - cancelled pt for here due to being on O2 at 3 liters, no recent cardiac follow up.

## 2013-11-16 NOTE — Telephone Encounter (Signed)
Pt has surgery scheduled for 11/22/13, but anesthesia has cancelled it for the following reasons:  She has lung Ca and uses O2/ 3L at night; has 2 stents since 2005 with no cardiac follow up; and is a diabetic.

## 2013-11-17 ENCOUNTER — Other Ambulatory Visit (INDEPENDENT_AMBULATORY_CARE_PROVIDER_SITE_OTHER): Payer: Self-pay | Admitting: Surgery

## 2013-11-17 ENCOUNTER — Encounter (INDEPENDENT_AMBULATORY_CARE_PROVIDER_SITE_OTHER): Payer: Self-pay | Admitting: General Surgery

## 2013-11-17 DIAGNOSIS — R229 Localized swelling, mass and lump, unspecified: Principal | ICD-10-CM

## 2013-11-17 DIAGNOSIS — IMO0002 Reserved for concepts with insufficient information to code with codable children: Secondary | ICD-10-CM

## 2013-11-21 ENCOUNTER — Ambulatory Visit (INDEPENDENT_AMBULATORY_CARE_PROVIDER_SITE_OTHER): Payer: Medicare Other | Admitting: Cardiology

## 2013-11-21 ENCOUNTER — Encounter: Payer: Self-pay | Admitting: Cardiology

## 2013-11-21 VITALS — BP 159/76 | HR 102 | Ht 62.0 in | Wt 196.1 lb

## 2013-11-21 DIAGNOSIS — I2510 Atherosclerotic heart disease of native coronary artery without angina pectoris: Secondary | ICD-10-CM

## 2013-11-21 DIAGNOSIS — I10 Essential (primary) hypertension: Secondary | ICD-10-CM

## 2013-11-21 MED ORDER — LISINOPRIL 10 MG PO TABS: ORAL_TABLET | ORAL | Status: DC

## 2013-11-21 NOTE — Progress Notes (Signed)
HPI The patient presents for yearly follow up. Since I last saw her she's had no new cardiovascular complaints. She does report that she will get dyspneic with activities but this has been chronic. She's not having any new chest pressure, neck or arm discomfort. She's not having any new palpitations, presyncope or syncope. She does use oxygen at night. She unfortunately is still continuing to smoke cigarettes. She reports that her blood pressure at the doctor's office has been high recently.   Allergies  Allergen Reactions  . Celebrex [Celecoxib] Palpitations and Other (See Comments)    Chest pain, tachycardia, diaphoresis  . Niacin Rash  . Varenicline Tartrate Rash    CHANTIX    Current Outpatient Prescriptions  Medication Sig Dispense Refill  . ACCU-CHEK AVIVA PLUS test strip       . albuterol-ipratropium (COMBIVENT) 18-103 MCG/ACT inhaler Inhale 2 puffs into the lungs as needed for wheezing or shortness of breath.       . Cholecalciferol (VITAMIN D) 1000 UNITS capsule Take 1,000 Units by mouth every evening.       [name] esomeprazole (NEXIUM) 40 MG capsule Take 40 mg by mouth as needed (for heartburm/ acid reflux).       . fexofenadine-pseudoephedrine (ALLEGRA-D 24) 180-240 MG per 24 hr tablet Take 1 tablet by mouth daily as needed (allergies).       [name] HYDROcodone-acetaminophen (NORCO) 10-325 MG per tablet Take 1-2 tablets by mouth every 4 (four) hours as needed.  30 tablet  0  . ibuprofen (ADVIL,MOTRIN) 200 MG tablet Take 400 mg by mouth every 6 (six) hours as needed for pain.      [name] insulin aspart (NOVOLOG) 100 UNIT/ML injection Inject 10-25 Units into the skin 3 (three) times daily before meals. Units given depends on her CBG readings. < 200 gives 10 units, > 200 gives 20 units, 20-25 units at supper depending on CBG reading      . insulin glargine (LANTUS) 100 UNIT/ML injection Inject 50 Units into the skin at bedtime.       [name] ipratropium (ATROVENT) 0.02 % nebulizer solution Take 500 mcg  by nebulization every 6 (six) hours as needed for wheezing.       [name] lisinopril (PRINIVIL,ZESTRIL) 10 MG tablet Take 10 mg by mouth daily.      Vladimir Faster Glycol-Propyl Glycol (SYSTANE) 0.4-0.3 % SOLN Apply 1 drop to eye 2 (two) times daily.       . simvastatin (ZOCOR) 40 MG tablet Take 40 mg by mouth every evening.       No current facility-administered medications for this visit.    Past Medical History  Diagnosis Date  . Asthma   . Cholelithiasis   . Colon polyps   . Coronary artery disease     The pt had stenting of the midsegment of the right coronary artery in 2005 with the Cypher stent. The last catheterization demonstrated an EF of 60%. The right coronary artery stent was patent. The left  main was normal. Circumflex had luminal irregularities. The LAD had luminal irregularities.   . Diabetes mellitus     Type II  . GERD (gastroesophageal reflux disease)   . Hyperlipidemia   . Tobacco abuse   . Bronchitis   . Shortness of breath     with ambulation  . Arthritis   . Urgency of urination   . COPD (chronic obstructive pulmonary disease)     USES O2 AT NIGHT + PRN IN DAYTIME3L  . Anginal  pain     PATIENT STATES DR. Gemayel Mascio STATED WAS FINE ON EXAM  . Emphysema   . H/O hiatal hernia   . Complication of anesthesia     no complication, but patient states no tube can be placed in her right nares due to narrowing from fracture  . Hypertension   . Pneumonia     had 4-5 weeks ago  . Fibromyalgia   . 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 Lung cancer 2010    Adenocarcinoma, right lung, node positive    Past Surgical History  Procedure Laterality Date  . Appendectomy  1970  . Gallbladder surgery  1989  . Abdominal hysterectomy  1972  . Tonsillectomy  1966  . Carpal tunnel release    . Replacement total knee  2001    left  . Neck surgery  2005  . Pneumonectomy      Right Partial, lung cancer confirmed, nod positive  . Right upper lobectomy with lymph node dissection   10/12/2008    Dr Arlyce Dice  . Cardiac catheterization    . Coronary angioplasty  2005  . Portacath placement  12/12/2007  . Port-a-cath removal  05/12/2012    Procedure: REMOVAL PORT-A-CATH;  Surgeon: Melrose Nakayama, MD;  Location: [facility];  Service: Thoracic;  Laterality: Left;  [name] Eye surgery      implants 02/18/12 (Left) 02/25/12 (right eye)  . Rotator cuff repair      RIGHT SHOULDER  2 YRS  . Orif humerus fracture Left 12/14/2012    Procedure: OPEN REDUCTION INTERNAL FIXATION (ORIF) LEFT PROXIMAL HUMERUS FRACTURE;  Surgeon: Mcarthur Rossetti, MD;  Location: [facility];  Service: Orthopedics;  Laterality: Left;  . Cholecystectomy    . Back surgery  2007    sept 2014  . Joint replacement      total knee right  . Abdominal hysterectomy      ROS:  As stated in the HPI and negative for all other systems.  PHYSICAL EXAM BP 159/76  Pulse 102  Ht 5\' 2"  (1.575 m)  Wt 196 lb 1.9 oz (88.959 kg)  BMI 35.86 kg/m2 GENERAL:  Well appearing HEENT:  Pupils equal round and reactive, fundi not visualized, oral mucosa unremarkable NECK:  No jugular venous distention, waveform within normal limits, carotid upstroke brisk and symmetric, no bruits, no thyromegaly LYMPHATICS:  No cervical, inguinal adenopathy LUNGS:  Expiratory wheezing scattered BACK:  No CVA tenderness CHEST:  Unremarkable, porta cath removed on the right side HEART:  PMI not displaced or sustained,S1 and S2 within normal limits, no S3, no S4, no clicks, no rubs, no murmurs ABD:  Flat, positive bowel sounds normal in frequency in pitch, no bruits, no rebound, no guarding, no midline pulsatile mass, no hepatomegaly, no splenomegaly EXT:  2 plus pulses throughout, no edema, no cyanosis no clubbing   EKG:  Sinus tachycardia, left axis deviation, poor anterior R-wave progression, no acute ST-T wave changes. Rate 102  11/21/2013  ASSESSMENT AND PLAN  CORONARY HEART DISEASE -  She had a dobutamine echocardiogram in 2012 that was  negative for any evidence of ischemia. She's had no change in symptoms. No further testing is indicated.   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  I continue to provide education and encouragement.   HYPERTENSION -  The blood pressure is elevated. I will increase the lisinopril to 10 mg bid.   We will continue with therapeutic lifestyle  changes (TLC).  She will keep track with her home BP monitor.    HYPERLIPIDEMIA -  She says she was actually taken off statins in the past because her cholesterol is too low. Her LDL recently was 133. Given the conflicting history I will not change her medicine.  DIABETES - She reports her A1c was much better. She continues to work actively with [name], MD

## 2013-11-21 NOTE — Patient Instructions (Addendum)
Your physician has recommended you make the following change in your medication:   1. Increase Lisinopril to 10 mg 1 tablet by mouth twice a day.  Your physician wants you to follow-up in: 6 months with Dr. Percival Spanish. You will receive a reminder letter in the mail two months in advance. If you don't receive a letter, please call our office to schedule the follow-up appointment.

## 2013-11-22 ENCOUNTER — Ambulatory Visit (HOSPITAL_BASED_OUTPATIENT_CLINIC_OR_DEPARTMENT_OTHER): Admission: RE | Admit: 2013-11-22 | Payer: Medicare Other | Source: Ambulatory Visit | Admitting: Surgery

## 2013-11-22 SURGERY — EXCISION MASS
Anesthesia: Monitor Anesthesia Care | Site: Thigh | Laterality: Right

## 2013-12-07 ENCOUNTER — Telehealth: Payer: Self-pay | Admitting: Cardiology

## 2013-12-07 NOTE — Telephone Encounter (Signed)
Will forward to Dr Percival Spanish for review and clearance

## 2013-12-07 NOTE — Telephone Encounter (Signed)
New message     Need clearance to have cyst removed off leg--please send clearance to Dr Mercy Riding at [facility].  Fax it to [phone].  Pt saw Dr Percival Spanish in feb

## 2013-12-09 NOTE — Telephone Encounter (Signed)
Ok to have cyst removed.

## 2013-12-09 NOTE — Telephone Encounter (Signed)
Pt aware OK to have cyst removed and faxed

## 2014-05-03 ENCOUNTER — Ambulatory Visit (HOSPITAL_COMMUNITY): Payer: Medicare Other

## 2014-05-03 ENCOUNTER — Telehealth: Payer: Self-pay | Admitting: *Deleted

## 2014-05-03 ENCOUNTER — Other Ambulatory Visit: Payer: Medicare Other

## 2014-05-03 NOTE — Telephone Encounter (Signed)
Received faxed notice from [facility] in radiology re: pt was a no show for CT scan scheduled for 05/03/14.   Attempted to call pt at home unsuccessfully.  Unable to leave message on voice mail.  Called daughter Mahala Menghini and left message on voice mail requesting a call back to either Dr. Worthy Flank nurse or triage nurse. Ven[facility]'s  Phone    [phone].

## 2014-05-04 ENCOUNTER — Other Ambulatory Visit (HOSPITAL_COMMUNITY): Payer: Self-pay | Admitting: Neurosurgery

## 2014-05-04 ENCOUNTER — Telehealth: Payer: Self-pay | Admitting: Internal Medicine

## 2014-05-04 DIAGNOSIS — IMO0002 Reserved for concepts with insufficient information to code with codable children: Secondary | ICD-10-CM

## 2014-05-04 NOTE — Telephone Encounter (Signed)
returned pt call and vm not set up.Rita KitchenMarland KitchenMarland [name]

## 2014-05-05 ENCOUNTER — Encounter: Payer: Self-pay | Admitting: *Deleted

## 2014-05-05 ENCOUNTER — Ambulatory Visit (HOSPITAL_COMMUNITY)
Admission: RE | Admit: 2014-05-05 | Discharge: 2014-05-05 | Disposition: A | Discharge status: Home / Self Care | Payer: Medicare Other | Source: Ambulatory Visit | Attending: Neurosurgery | Admitting: Neurosurgery

## 2014-05-05 DIAGNOSIS — M5137 Other intervertebral disc degeneration, lumbosacral region: Secondary | ICD-10-CM | POA: Insufficient documentation

## 2014-05-05 DIAGNOSIS — IMO0002 Reserved for concepts with insufficient information to code with codable children: Secondary | ICD-10-CM

## 2014-05-05 DIAGNOSIS — M51379 Other intervertebral disc degeneration, lumbosacral region without mention of lumbar back pain or lower extremity pain: Secondary | ICD-10-CM | POA: Insufficient documentation

## 2014-05-05 MED ORDER — GADOBENATE DIMEGLUMINE 529 MG/ML IV SOLN
18.0000 mL | Freq: Once | INTRAVENOUS | Status: AC | PRN
  Administered 2014-05-05: 18 mL via INTRAVENOUS

## 2014-05-08 ENCOUNTER — Other Ambulatory Visit (HOSPITAL_COMMUNITY): Payer: Medicare Other

## 2014-05-10 ENCOUNTER — Ambulatory Visit: Payer: Medicare Other | Admitting: Internal Medicine

## 2014-05-15 ENCOUNTER — Other Ambulatory Visit (HOSPITAL_COMMUNITY): Payer: Self-pay | Admitting: Family Medicine

## 2014-05-15 DIAGNOSIS — N281 Cyst of kidney, acquired: Secondary | ICD-10-CM

## 2014-05-16 ENCOUNTER — Other Ambulatory Visit: Payer: Self-pay | Admitting: Neurosurgery

## 2014-05-16 DIAGNOSIS — M5416 Radiculopathy, lumbar region: Secondary | ICD-10-CM

## 2014-05-18 ENCOUNTER — Ambulatory Visit (HOSPITAL_COMMUNITY)
Admission: RE | Admit: 2014-05-18 | Discharge: 2014-05-18 | Disposition: A | Discharge status: Home / Self Care | Payer: Medicare Other | Source: Ambulatory Visit | Attending: Family Medicine | Admitting: Family Medicine

## 2014-05-18 DIAGNOSIS — Q619 Cystic kidney disease, unspecified: Secondary | ICD-10-CM | POA: Diagnosis present

## 2014-05-18 DIAGNOSIS — N281 Cyst of kidney, acquired: Secondary | ICD-10-CM

## 2014-05-22 ENCOUNTER — Ambulatory Visit
Admission: RE | Admit: 2014-05-22 | Discharge: 2014-05-22 | Disposition: A | Discharge status: Home / Self Care | Payer: Medicare Other | Source: Ambulatory Visit | Attending: Neurosurgery | Admitting: Neurosurgery

## 2014-05-22 ENCOUNTER — Other Ambulatory Visit: Payer: Self-pay | Admitting: Neurosurgery

## 2014-05-22 VITALS — BP 163/98 | HR 97

## 2014-05-22 DIAGNOSIS — M5416 Radiculopathy, lumbar region: Secondary | ICD-10-CM

## 2014-05-22 MED ORDER — IOHEXOL 180 MG/ML  SOLN
1.0000 mL | Freq: Once | INTRAMUSCULAR | Status: AC | PRN
  Administered 2014-05-22: 1 mL via EPIDURAL

## 2014-05-22 MED ORDER — METHYLPREDNISOLONE ACETATE 40 MG/ML INJ SUSP (RADIOLOG: 120.0000 mg | Freq: Once | INTRAMUSCULAR | Status: DC

## 2014-05-22 NOTE — Discharge Instructions (Signed)

## 2014-05-31 ENCOUNTER — Other Ambulatory Visit: Payer: Self-pay | Admitting: Neurosurgery

## 2014-05-31 DIAGNOSIS — IMO0002 Reserved for concepts with insufficient information to code with codable children: Secondary | ICD-10-CM

## 2014-06-16 ENCOUNTER — Emergency Department (HOSPITAL_COMMUNITY)
Admission: EM | Admit: 2014-06-16 | Discharge: 2014-06-16 | Disposition: A | Discharge status: Home / Self Care | Payer: Medicare Other | Source: Home / Self Care | Attending: Emergency Medicine | Admitting: Emergency Medicine

## 2014-06-16 ENCOUNTER — Emergency Department (HOSPITAL_COMMUNITY): Payer: Medicare Other

## 2014-06-16 ENCOUNTER — Encounter (HOSPITAL_COMMUNITY): Payer: Self-pay | Admitting: Emergency Medicine

## 2014-06-16 DIAGNOSIS — Z8669 Personal history of other diseases of the nervous system and sense organs: Secondary | ICD-10-CM | POA: Insufficient documentation

## 2014-06-16 DIAGNOSIS — E119 Type 2 diabetes mellitus without complications: Secondary | ICD-10-CM | POA: Insufficient documentation

## 2014-06-16 DIAGNOSIS — Z9889 Other specified postprocedural states: Secondary | ICD-10-CM

## 2014-06-16 DIAGNOSIS — F17200 Nicotine dependence, unspecified, uncomplicated: Secondary | ICD-10-CM

## 2014-06-16 DIAGNOSIS — Z9861 Coronary angioplasty status: Secondary | ICD-10-CM | POA: Insufficient documentation

## 2014-06-16 DIAGNOSIS — Z8739 Personal history of other diseases of the musculoskeletal system and connective tissue: Secondary | ICD-10-CM | POA: Insufficient documentation

## 2014-06-16 DIAGNOSIS — R079 Chest pain, unspecified: Secondary | ICD-10-CM | POA: Insufficient documentation

## 2014-06-16 DIAGNOSIS — Z794 Long term (current) use of insulin: Secondary | ICD-10-CM

## 2014-06-16 DIAGNOSIS — I10 Essential (primary) hypertension: Secondary | ICD-10-CM

## 2014-06-16 DIAGNOSIS — Z860100 Personal history of colon polyps, unspecified: Secondary | ICD-10-CM | POA: Insufficient documentation

## 2014-06-16 DIAGNOSIS — J438 Other emphysema: Secondary | ICD-10-CM | POA: Insufficient documentation

## 2014-06-16 DIAGNOSIS — Z8701 Personal history of pneumonia (recurrent): Secondary | ICD-10-CM | POA: Insufficient documentation

## 2014-06-16 DIAGNOSIS — K219 Gastro-esophageal reflux disease without esophagitis: Secondary | ICD-10-CM | POA: Insufficient documentation

## 2014-06-16 DIAGNOSIS — Z79899 Other long term (current) drug therapy: Secondary | ICD-10-CM

## 2014-06-16 DIAGNOSIS — R0789 Other chest pain: Secondary | ICD-10-CM | POA: Diagnosis not present

## 2014-06-16 DIAGNOSIS — E785 Hyperlipidemia, unspecified: Secondary | ICD-10-CM

## 2014-06-16 DIAGNOSIS — I2510 Atherosclerotic heart disease of native coronary artery without angina pectoris: Secondary | ICD-10-CM | POA: Diagnosis not present

## 2014-06-16 DIAGNOSIS — Z85118 Personal history of other malignant neoplasm of bronchus and lung: Secondary | ICD-10-CM | POA: Insufficient documentation

## 2014-06-16 LAB — BASIC METABOLIC PANEL
ANION GAP: 13 (ref 5–15)
BUN: 21 mg/dL (ref 6–23)
CALCIUM: 9.4 mg/dL (ref 8.4–10.5)
CO2: 27 mEq/L (ref 19–32)
Chloride: 102 mEq/L (ref 96–112)
Creatinine, Ser: 0.76 mg/dL (ref 0.50–1.10)
GFR calc Af Amer: >90 mL/min (ref >90)
GFR calc non Af Amer: 85 mL/min — ABNORMAL LOW (ref >90)
Glucose, Bld: 192 mg/dL — ABNORMAL HIGH (ref 70–99)
Potassium: 3.7 mEq/L (ref 3.7–5.3)
SODIUM: 142 meq/L (ref 137–147)

## 2014-06-16 LAB — HEPATIC FUNCTION PANEL
ALT: 13 U/L (ref 0–35)
AST: 13 U/L (ref 0–37)
Albumin: 3.8 g/dL (ref 3.5–5.2)
Alkaline Phosphatase: 113 U/L (ref 39–117)
BILIRUBIN TOTAL: 0.2 mg/dL — AB (ref 0.3–1.2)
Total Protein: 7.8 g/dL (ref 6.0–8.3)

## 2014-06-16 LAB — CBC
HCT: 49.9 % — ABNORMAL HIGH (ref 36.0–46.0)
Hemoglobin: 16.7 g/dL — ABNORMAL HIGH (ref 12.0–15.0)
MCH: 27.8 pg (ref 26.0–34.0)
MCHC: 33.5 g/dL (ref 30.0–36.0)
MCV: 83.2 fL (ref 78.0–100.0)
PLATELETS: 145 10*3/uL — AB (ref 150–400)
RBC: 6 MIL/uL — AB (ref 3.87–5.11)
RDW: 14.3 % (ref 11.5–15.5)
WBC: 11.3 10*3/uL — ABNORMAL HIGH (ref 4.0–10.5)

## 2014-06-16 LAB — TROPONIN I: Troponin I: <0.3 ng/mL (ref <0.30)

## 2014-06-16 LAB — PRO B NATRIURETIC PEPTIDE: Pro B Natriuretic peptide (BNP): 142.5 pg/mL — ABNORMAL HIGH (ref 0–125)

## 2014-06-16 MED ORDER — NITROGLYCERIN 2 % TD OINT
1.0000 [in_us] | TOPICAL_OINTMENT | Freq: Once | TRANSDERMAL | Status: AC
  Administered 2014-06-16: 1 [in_us] via TOPICAL
  Filled 2014-06-16: qty 1

## 2014-06-16 MED ORDER — MORPHINE SULFATE 2 MG/ML IJ SOLN
2.0000 mg | Freq: Once | INTRAMUSCULAR | Status: AC
  Administered 2014-06-16: 2 mg via INTRAVENOUS
  Filled 2014-06-16: qty 1

## 2014-06-16 NOTE — ED Notes (Signed)
Pt complain of pressure in her chest with being SOB. Pt was at her PCP and sent to the ED for evaluation

## 2014-06-16 NOTE — Discharge Instructions (Signed)
Follow up this weekend if getting worse.  Follow up Monday as planned

## 2014-06-16 NOTE — ED Notes (Signed)
Pt states she doesn't want to be admitted, EDP aware and at beside.

## 2014-06-16 NOTE — ED Provider Notes (Addendum)
CSN: [id number]     Arrival date & time 06/16/14  1540 History   First MD Initiated Contact with Patient 06/16/14 1548     This chart was scribed for Maudry Diego, MD by Edison Simon, ED Scribe. This patient was seen in room APA18/APA18 and the patient's care was started at 3:51 PM.   Chief Complaint  Patient presents with  . Chest Pain   Patient is a 68 y.o. female presenting with chest pain. The history is provided by the patient. No language interpreter was used.  Chest Pain Pain location:  L chest Pain quality: aching   Pain radiates to:  Does not radiate Pain radiates to the back: no   Pain severity:  Moderate Onset quality:  Sudden Timing:  Constant Chronicity:  Recurrent Context: breathing   Associated symptoms: diaphoresis and shortness of breath   Associated symptoms: no abdominal pain, no back pain, no cough, no fatigue and no headache     HPI Comments: [name] is a 68 y.o. female who presents to the Emergency Department complaining of chest pain with onset around 12:30 today. She reports having similar prior chest pains and a cardiac stent placed in 2005. She went to see her PCP for her chest pain today and was referred here. She states her heart rate has been elevated this week, measured above 100. She states she thinks she's been told she had a small heart attack before. She states pain is worse with activity but resolves with rest. She reports associated shortness of breath and diaphoresis. She states that she smokes.  Past Medical History  Diagnosis Date  . Asthma   . Cholelithiasis   . Colon polyps   . Coronary artery disease     The pt had stenting of the midsegment of the right coronary artery in 2005 with the Cypher stent. The last catheterization demonstrated an EF of 60%. The right coronary artery stent was patent. The left  main was normal. Circumflex had luminal irregularities. The LAD had luminal irregularities.   . Diabetes mellitus     Type II  .  GERD (gastroesophageal reflux disease)   . Hyperlipidemia   . Tobacco abuse   . Bronchitis   . Shortness of breath     with ambulation  . Arthritis   . Urgency of urination   . COPD (chronic obstructive pulmonary disease)     USES O2 AT NIGHT + PRN IN DAYTIME3L  . Anginal pain     PATIENT STATES DR. HOCHREIN STATED WAS FINE ON EXAM  . Emphysema   . H/O hiatal hernia   . Complication of anesthesia     no complication, but patient states no tube can be placed in her right nares due to narrowing from fracture  . Hypertension   . Pneumonia     had 4-5 weeks ago  . Fibromyalgia   . 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 Lung cancer 2010    Adenocarcinoma, right lung, node positive   Past Surgical History  Procedure Laterality Date  . Appendectomy  1970  . Gallbladder surgery  1989  . Abdominal hysterectomy  1972  . Tonsillectomy  1966  . Carpal tunnel release    . Replacement total knee  2001    left  . Neck surgery  2005  . Pneumonectomy      Right Partial, lung cancer confirmed, nod positive  . Right upper lobectomy with lymph node dissection  10/12/2008  Dr Arlyce Dice  . Cardiac catheterization    . Coronary angioplasty  2005  . Portacath placement  12/12/2007  . Port-a-cath removal  05/12/2012    Procedure: REMOVAL PORT-A-CATH;  Surgeon: Melrose Nakayama, MD;  Location: [facility];  Service: Thoracic;  Laterality: Left;  [name] Eye surgery      implants 02/18/12 (Left) 02/25/12 (right eye)  . Rotator cuff repair      RIGHT SHOULDER  2 YRS  . Orif humerus fracture Left 12/14/2012    Procedure: OPEN REDUCTION INTERNAL FIXATION (ORIF) LEFT PROXIMAL HUMERUS FRACTURE;  Surgeon: Mcarthur Rossetti, MD;  Location: [facility];  Service: Orthopedics;  Laterality: Left;  . Cholecystectomy    . Back surgery  2007    sept 2014  . Joint replacement      total knee right  . Abdominal hysterectomy     Family History  Problem Relation Age of Onset  . Diabetes Mother   . Diabetes Father   .  Heart disease    . Arthritis    . Lung disease    . Cancer    . Asthma     History  Substance Use Topics  . Smoking status: Current Every Day Smoker -- 1.00 packs/day for 45 years    Types: Cigarettes  . Smokeless tobacco: Not on file  . Alcohol Use: No   OB History   Grav Para Term Preterm Abortions TAB SAB Ect Mult Living                 Review of Systems  Constitutional: Positive for diaphoresis. Negative for appetite change and fatigue.  HENT: Negative for congestion, ear discharge and sinus pressure.   Eyes: Negative for discharge.  Respiratory: Positive for shortness of breath. Negative for cough.   Cardiovascular: Positive for chest pain.  Gastrointestinal: Negative for abdominal pain and diarrhea.  Genitourinary: Negative for frequency and hematuria.  Musculoskeletal: Negative for back pain.  Skin: Negative for rash.  Neurological: Negative for seizures and headaches.  Psychiatric/Behavioral: Negative for hallucinations.      Allergies  Celebrex; Niacin; and Varenicline tartrate  Home Medications   Prior to Admission medications   Medication Sig Start Date End Date Taking? Authorizing Provider  ACCU-CHEK AVIVA PLUS test strip  10/15/13   Historical Provider, MD  albuterol-ipratropium (COMBIVENT) 18-103 MCG/ACT inhaler Inhale 2 puffs into the lungs as needed for wheezing or shortness of breath.     Historical Provider, MD  Cholecalciferol (VITAMIN D) 1000 UNITS capsule Take 1,000 Units by mouth every evening.     Historical Provider, MD  esomeprazole (NEXIUM) 40 MG capsule Take 40 mg by mouth as needed (for heartburm/ acid reflux).     Historical Provider, MD  fexofenadine-pseudoephedrine (ALLEGRA-D 24) 180-240 MG per 24 hr tablet Take 1 tablet by mouth daily as needed (allergies).     Historical Provider, MD  HYDROcodone-acetaminophen (NORCO) 10-325 MG per tablet Take 1-2 tablets by mouth every 4 (four) hours as needed. 07/31/13   Consuella Lose, MD  ibuprofen  (ADVIL,MOTRIN) 200 MG tablet Take 400 mg by mouth every 6 (six) hours as needed for pain.    Historical Provider, MD  insulin aspart (NOVOLOG) 100 UNIT/ML injection Inject 10-25 Units into the skin 3 (three) times daily before meals. Units given depends on her CBG readings. < 200 gives 10 units, > 200 gives 20 units, 20-25 units at supper depending on CBG reading    Historical Provider, MD  insulin glargine (LANTUS) 100 UNIT/ML  injection Inject 50 Units into the skin at bedtime.     Historical Provider, MD  ipratropium (ATROVENT) 0.02 % nebulizer solution Take 500 mcg by nebulization every 6 (six) hours as needed for wheezing.     Historical Provider, MD  lisinopril (PRINIVIL,ZESTRIL) 10 MG tablet 1 tablet by mouth twice a day 11/21/13   Minus Breeding, MD  Polyethyl Glycol-Propyl Glycol (SYSTANE) 0.4-0.3 % SOLN Apply 1 drop to eye 2 (two) times daily.     Historical Provider, MD  simvastatin (ZOCOR) 40 MG tablet Take 40 mg by mouth every evening.    Historical Provider, MD   There were no vitals taken for this visit. Physical Exam  Nursing note and vitals reviewed. Constitutional: She is oriented to person, place, and time. She appears well-developed.  HENT:  Head: Normocephalic.  Eyes: Conjunctivae and EOM are normal. No scleral icterus.  Neck: Neck supple. No thyromegaly present.  Cardiovascular: Normal rate and regular rhythm.  Exam reveals no gallop and no friction rub.   No murmur heard. Pulmonary/Chest: No stridor. She has wheezes (mild wheezes bilat). She has no rales. She exhibits no tenderness.  Abdominal: She exhibits no distension. There is no tenderness. There is no rebound.  Musculoskeletal: Normal range of motion. She exhibits no edema.  Lymphadenopathy:    She has no cervical adenopathy.  Neurological: She is oriented to person, place, and time. She exhibits normal muscle tone. Coordination normal.  Skin: No rash noted. No erythema.  Psychiatric: She has a normal mood and  affect. Her behavior is normal.    ED Course  Procedures (including critical care time) Labs Review Labs Reviewed - No data to display  Imaging Review No results found.   EKG Interpretation   Date/Time:  Friday June 16 2014 15:48:11 EDT Ventricular Rate:  95 PR Interval:  194 QRS Duration: 80 QT Interval:  366 QTC Calculation: 460 R Axis:   -38 Text Interpretation:  Sinus rhythm Left axis deviation Probable  anteroseptal infarct, old Confirmed by Massai Hankerson  MD, Kendyl Festa [phone]) on  06/16/2014 5:47:09 PM     DIAGNOSTIC STUDIES: Oxygen Saturation is 92% on adequate, normal by my interpretation.    COORDINATION OF CARE:    MDM   Final diagnoses:  None  chest pain improved possibly cardiac chest pain.  Pt told she should be admitted that the pain maybe from her heart.   The pt refused admission  The chart was scribed for me under my direct supervision.  I personally performed the history, physical, and medical decision making and all procedures in the evaluation of this patient.Maudry Diego, MD 06/16/14 Vernelle Emerald  Maudry Diego, MD 06/16/14 [phone]

## 2014-06-16 NOTE — ED Notes (Signed)
Pt wants to go home, iv removed, waiting on d/c papers.

## 2014-06-19 ENCOUNTER — Inpatient Hospital Stay (HOSPITAL_COMMUNITY)
Admission: EM | Admit: 2014-06-19 | Discharge: 2014-06-21 | DRG: 247 | Disposition: A | Discharge status: Home / Self Care | Payer: Medicare Other | Source: Ambulatory Visit | Attending: Cardiology | Admitting: Cardiology

## 2014-06-19 ENCOUNTER — Encounter: Payer: Self-pay | Admitting: Cardiology

## 2014-06-19 ENCOUNTER — Ambulatory Visit (INDEPENDENT_AMBULATORY_CARE_PROVIDER_SITE_OTHER): Payer: Medicare Other | Admitting: Cardiology

## 2014-06-19 ENCOUNTER — Encounter (HOSPITAL_COMMUNITY): Payer: Self-pay | Admitting: *Deleted

## 2014-06-19 VITALS — BP 156/96 | HR 99 | Ht 62.0 in | Wt 203.4 lb

## 2014-06-19 DIAGNOSIS — I200 Unstable angina: Secondary | ICD-10-CM | POA: Diagnosis present

## 2014-06-19 DIAGNOSIS — E119 Type 2 diabetes mellitus without complications: Secondary | ICD-10-CM | POA: Diagnosis present

## 2014-06-19 DIAGNOSIS — I2510 Atherosclerotic heart disease of native coronary artery without angina pectoris: Principal | ICD-10-CM | POA: Diagnosis not present

## 2014-06-19 DIAGNOSIS — J449 Chronic obstructive pulmonary disease, unspecified: Secondary | ICD-10-CM

## 2014-06-19 DIAGNOSIS — Z791 Long term (current) use of non-steroidal anti-inflammatories (NSAID): Secondary | ICD-10-CM

## 2014-06-19 DIAGNOSIS — Z833 Family history of diabetes mellitus: Secondary | ICD-10-CM

## 2014-06-19 DIAGNOSIS — Z9861 Coronary angioplasty status: Secondary | ICD-10-CM

## 2014-06-19 DIAGNOSIS — J438 Other emphysema: Secondary | ICD-10-CM | POA: Diagnosis present

## 2014-06-19 DIAGNOSIS — K219 Gastro-esophageal reflux disease without esophagitis: Secondary | ICD-10-CM | POA: Diagnosis present

## 2014-06-19 DIAGNOSIS — E785 Hyperlipidemia, unspecified: Secondary | ICD-10-CM | POA: Diagnosis present

## 2014-06-19 DIAGNOSIS — Z8261 Family history of arthritis: Secondary | ICD-10-CM | POA: Diagnosis not present

## 2014-06-19 DIAGNOSIS — R0789 Other chest pain: Secondary | ICD-10-CM | POA: Diagnosis present

## 2014-06-19 DIAGNOSIS — J45909 Unspecified asthma, uncomplicated: Secondary | ICD-10-CM | POA: Diagnosis present

## 2014-06-19 DIAGNOSIS — Z888 Allergy status to other drugs, medicaments and biological substances status: Secondary | ICD-10-CM

## 2014-06-19 DIAGNOSIS — Z825 Family history of asthma and other chronic lower respiratory diseases: Secondary | ICD-10-CM

## 2014-06-19 DIAGNOSIS — Z85118 Personal history of other malignant neoplasm of bronchus and lung: Secondary | ICD-10-CM

## 2014-06-19 DIAGNOSIS — Z96659 Presence of unspecified artificial knee joint: Secondary | ICD-10-CM

## 2014-06-19 DIAGNOSIS — Z7982 Long term (current) use of aspirin: Secondary | ICD-10-CM

## 2014-06-19 DIAGNOSIS — I10 Essential (primary) hypertension: Secondary | ICD-10-CM | POA: Diagnosis present

## 2014-06-19 DIAGNOSIS — Z8249 Family history of ischemic heart disease and other diseases of the circulatory system: Secondary | ICD-10-CM

## 2014-06-19 DIAGNOSIS — Z794 Long term (current) use of insulin: Secondary | ICD-10-CM

## 2014-06-19 DIAGNOSIS — Z79899 Other long term (current) drug therapy: Secondary | ICD-10-CM

## 2014-06-19 DIAGNOSIS — F17200 Nicotine dependence, unspecified, uncomplicated: Secondary | ICD-10-CM | POA: Diagnosis present

## 2014-06-19 DIAGNOSIS — Z7902 Long term (current) use of antithrombotics/antiplatelets: Secondary | ICD-10-CM | POA: Diagnosis not present

## 2014-06-19 DIAGNOSIS — IMO0001 Reserved for inherently not codable concepts without codable children: Secondary | ICD-10-CM | POA: Diagnosis present

## 2014-06-19 LAB — GLUCOSE, CAPILLARY
Glucose-Capillary: 253 mg/dL — ABNORMAL HIGH (ref 70–99)
Glucose-Capillary: 251 mg/dL — ABNORMAL HIGH (ref 70–99)

## 2014-06-19 LAB — MAGNESIUM: Magnesium: 1.9 mg/dL (ref 1.5–2.5)

## 2014-06-19 LAB — COMPREHENSIVE METABOLIC PANEL
ALT: 12 U/L (ref 0–35)
AST: 12 U/L (ref 0–37)
Albumin: 3.5 g/dL (ref 3.5–5.2)
Alkaline Phosphatase: 98 U/L (ref 39–117)
Anion gap: 15 (ref 5–15)
BUN: 16 mg/dL (ref 6–23)
CALCIUM: 9.2 mg/dL (ref 8.4–10.5)
CHLORIDE: 100 meq/L (ref 96–112)
CO2: 22 meq/L (ref 19–32)
Creatinine, Ser: 0.57 mg/dL (ref 0.50–1.10)
GFR calc Af Amer: >90 mL/min (ref >90)
Glucose, Bld: 243 mg/dL — ABNORMAL HIGH (ref 70–99)
POTASSIUM: 4.2 meq/L (ref 3.7–5.3)
SODIUM: 137 meq/L (ref 137–147)
Total Bilirubin: 0.4 mg/dL (ref 0.3–1.2)
Total Protein: 6.8 g/dL (ref 6.0–8.3)

## 2014-06-19 LAB — HEPARIN LEVEL (UNFRACTIONATED): Heparin Unfractionated: 0.2 IU/mL — ABNORMAL LOW (ref 0.30–0.70)

## 2014-06-19 LAB — MRSA PCR SCREENING: MRSA BY PCR: NEGATIVE

## 2014-06-19 LAB — APTT: aPTT: 145 seconds — ABNORMAL HIGH (ref 24–37)

## 2014-06-19 LAB — TSH: TSH: 0.709 u[IU]/mL (ref 0.350–4.500)

## 2014-06-19 LAB — TROPONIN I: Troponin I: <0.3 ng/mL (ref <0.30)

## 2014-06-19 LAB — PROTIME-INR
INR: 1 (ref 0.00–1.49)
PROTHROMBIN TIME: 13.2 s (ref 11.6–15.2)

## 2014-06-19 LAB — T4, FREE: Free T4: 1.2 ng/dL (ref 0.80–1.80)

## 2014-06-19 MED ORDER — SODIUM CHLORIDE 0.9 % IV SOLN: 1.0000 mL/kg/h | INTRAVENOUS | Status: DC

## 2014-06-19 MED ORDER — SODIUM CHLORIDE 0.9 % IJ SOLN: 3.0000 mL | INTRAMUSCULAR | Status: DC | PRN

## 2014-06-19 MED ORDER — SODIUM CHLORIDE 0.9 % IJ SOLN
3.0000 mL | Freq: Two times a day (BID) | INTRAMUSCULAR | Status: DC
  Administered 2014-06-19 – 2014-06-20 (×2): 3 mL via INTRAVENOUS

## 2014-06-19 MED ORDER — ASPIRIN 81 MG PO CHEW
324.0000 mg | CHEWABLE_TABLET | ORAL | Status: AC
  Administered 2014-06-19: 324 mg via ORAL
  Filled 2014-06-19: qty 4

## 2014-06-19 MED ORDER — PREGABALIN 50 MG PO CAPS
50.0000 mg | ORAL_CAPSULE | Freq: Two times a day (BID) | ORAL | Status: DC
  Administered 2014-06-19 – 2014-06-21 (×4): 50 mg via ORAL
  Filled 2014-06-19 (×4): qty 1

## 2014-06-19 MED ORDER — VITAMIN D3 25 MCG (1000 UNIT) PO TABS
1000.0000 [IU] | ORAL_TABLET | Freq: Every day | ORAL | Status: DC
  Administered 2014-06-20 – 2014-06-21 (×2): 1000 [IU] via ORAL
  Filled 2014-06-19 (×4): qty 1

## 2014-06-19 MED ORDER — PANTOPRAZOLE SODIUM 40 MG PO TBEC
80.0000 mg | DELAYED_RELEASE_TABLET | Freq: Every day | ORAL | Status: DC
  Administered 2014-06-20 – 2014-06-21 (×2): 80 mg via ORAL
  Filled 2014-06-19 (×2): qty 2

## 2014-06-19 MED ORDER — INSULIN ASPART 100 UNIT/ML ~~LOC~~ SOLN
0.0000 [IU] | Freq: Every day | SUBCUTANEOUS | Status: DC
  Administered 2014-06-19: 3 [IU] via SUBCUTANEOUS
  Administered 2014-06-20: 2 [IU] via SUBCUTANEOUS

## 2014-06-19 MED ORDER — INSULIN DETEMIR 100 UNIT/ML ~~LOC~~ SOLN
25.0000 [IU] | Freq: Every day | SUBCUTANEOUS | Status: DC
  Administered 2014-06-19 – 2014-06-20 (×2): 25 [IU] via SUBCUTANEOUS
  Filled 2014-06-19 (×3): qty 0.25

## 2014-06-19 MED ORDER — IPRATROPIUM-ALBUTEROL 0.5-2.5 (3) MG/3ML IN SOLN
3.0000 mL | Freq: Four times a day (QID) | RESPIRATORY_TRACT | Status: DC | PRN
  Administered 2014-06-21: 3 mL via RESPIRATORY_TRACT
  Filled 2014-06-19: qty 3

## 2014-06-19 MED ORDER — NICOTINE 21 MG/24HR TD PT24
21.0000 mg | MEDICATED_PATCH | TRANSDERMAL | Status: DC
  Filled 2014-06-19 (×2): qty 1

## 2014-06-19 MED ORDER — NITROGLYCERIN 0.4 MG SL SUBL: 0.4000 mg | SUBLINGUAL_TABLET | SUBLINGUAL | Status: DC | PRN

## 2014-06-19 MED ORDER — CETYLPYRIDINIUM CHLORIDE 0.05 % MT LIQD
7.0000 mL | Freq: Two times a day (BID) | OROMUCOSAL | Status: DC
  Administered 2014-06-19 – 2014-06-21 (×4): 7 mL via OROMUCOSAL

## 2014-06-19 MED ORDER — INSULIN ASPART 100 UNIT/ML ~~LOC~~ SOLN
0.0000 [IU] | Freq: Three times a day (TID) | SUBCUTANEOUS | Status: DC
  Administered 2014-06-19: 5 [IU] via SUBCUTANEOUS
  Administered 2014-06-20 (×3): 3 [IU] via SUBCUTANEOUS
  Administered 2014-06-21: 5 [IU] via SUBCUTANEOUS
  Administered 2014-06-21: 3 [IU] via SUBCUTANEOUS

## 2014-06-19 MED ORDER — ASPIRIN 300 MG RE SUPP
300.0000 mg | RECTAL | Status: AC
  Filled 2014-06-19: qty 1

## 2014-06-19 MED ORDER — PREGABALIN 50 MG PO CAPS: 50.0000 mg | ORAL_CAPSULE | Freq: Two times a day (BID) | ORAL | Status: DC

## 2014-06-19 MED ORDER — ONDANSETRON HCL 4 MG/2ML IJ SOLN: 4.0000 mg | Freq: Four times a day (QID) | INTRAMUSCULAR | Status: DC | PRN

## 2014-06-19 MED ORDER — HEPARIN (PORCINE) IN NACL 100-0.45 UNIT/ML-% IJ SOLN
1300.0000 [IU]/h | INTRAMUSCULAR | Status: DC
  Administered 2014-06-19: 1000 [IU]/h via INTRAVENOUS
  Filled 2014-06-19 (×2): qty 250

## 2014-06-19 MED ORDER — ASPIRIN EC 81 MG PO TBEC
81.0000 mg | DELAYED_RELEASE_TABLET | Freq: Every day | ORAL | Status: DC
  Administered 2014-06-20 – 2014-06-21 (×2): 81 mg via ORAL
  Filled 2014-06-19 (×2): qty 1

## 2014-06-19 MED ORDER — POLYVINYL ALCOHOL 1.4 % OP SOLN
1.0000 [drp] | Freq: Two times a day (BID) | OPHTHALMIC | Status: DC
  Administered 2014-06-19 – 2014-06-21 (×4): 1 [drp] via OPHTHALMIC
  Filled 2014-06-19: qty 15

## 2014-06-19 MED ORDER — METOPROLOL TARTRATE 12.5 MG HALF TABLET
12.5000 mg | ORAL_TABLET | Freq: Two times a day (BID) | ORAL | Status: DC
  Administered 2014-06-19 – 2014-06-21 (×4): 12.5 mg via ORAL
  Filled 2014-06-19 (×5): qty 1

## 2014-06-19 MED ORDER — VITAMIN D 1000 UNITS PO CAPS: 1000.0000 [IU] | ORAL_CAPSULE | Freq: Every day | ORAL | Status: DC

## 2014-06-19 MED ORDER — IPRATROPIUM-ALBUTEROL 18-103 MCG/ACT IN AERO: 2.0000 | INHALATION_SPRAY | RESPIRATORY_TRACT | Status: DC | PRN

## 2014-06-19 MED ORDER — ZOLPIDEM TARTRATE 5 MG PO TABS: 5.0000 mg | ORAL_TABLET | Freq: Every evening | ORAL | Status: DC | PRN

## 2014-06-19 MED ORDER — ASPIRIN 81 MG PO CHEW
81.0000 mg | CHEWABLE_TABLET | ORAL | Status: AC
  Administered 2014-06-20: 81 mg via ORAL
  Filled 2014-06-19: qty 1

## 2014-06-19 MED ORDER — NITROGLYCERIN IN D5W 200-5 MCG/ML-% IV SOLN
5.0000 ug/min | INTRAVENOUS | Status: DC
  Administered 2014-06-19: 5 ug/min via INTRAVENOUS
  Filled 2014-06-19: qty 250

## 2014-06-19 MED ORDER — POLYETHYL GLYCOL-PROPYL GLYCOL 0.4-0.3 % OP SOLN: 1.0000 [drp] | Freq: Two times a day (BID) | OPHTHALMIC | Status: DC

## 2014-06-19 MED ORDER — HEPARIN BOLUS VIA INFUSION
4000.0000 [IU] | Freq: Once | INTRAVENOUS | Status: AC
  Administered 2014-06-19: 4000 [IU] via INTRAVENOUS
  Filled 2014-06-19: qty 4000

## 2014-06-19 MED ORDER — PNEUMOCOCCAL VAC POLYVALENT 25 MCG/0.5ML IJ INJ
0.5000 mL | INJECTION | INTRAMUSCULAR | Status: AC
  Administered 2014-06-21: 0.5 mL via INTRAMUSCULAR
  Filled 2014-06-19: qty 0.5

## 2014-06-19 MED ORDER — ACETAMINOPHEN 325 MG PO TABS
650.0000 mg | ORAL_TABLET | ORAL | Status: DC | PRN
  Administered 2014-06-20: 650 mg via ORAL
  Filled 2014-06-19: qty 2

## 2014-06-19 MED ORDER — ALPRAZOLAM 0.25 MG PO TABS: 0.2500 mg | ORAL_TABLET | Freq: Two times a day (BID) | ORAL | Status: DC | PRN

## 2014-06-19 MED ORDER — NITROGLYCERIN 2 % TD OINT
0.5000 [in_us] | TOPICAL_OINTMENT | Freq: Three times a day (TID) | TRANSDERMAL | Status: DC
  Filled 2014-06-19: qty 30

## 2014-06-19 MED ORDER — INSULIN ASPART 100 UNIT/ML ~~LOC~~ SOLN: 10.0000 [IU] | Freq: Three times a day (TID) | SUBCUTANEOUS | Status: DC

## 2014-06-19 MED ORDER — SODIUM CHLORIDE 0.9 % IV SOLN: 250.0000 mL | INTRAVENOUS | Status: DC | PRN

## 2014-06-19 MED ORDER — SODIUM CHLORIDE 0.9 % IV SOLN
INTRAVENOUS | Status: DC
  Administered 2014-06-19: 10 mL/h via INTRAVENOUS

## 2014-06-19 MED ORDER — ATORVASTATIN CALCIUM 10 MG PO TABS
10.0000 mg | ORAL_TABLET | Freq: Every day | ORAL | Status: DC
  Administered 2014-06-19: 10 mg via ORAL
  Filled 2014-06-19 (×2): qty 1

## 2014-06-19 NOTE — Progress Notes (Signed)
HPI The patient presents for followup of her known coronary disease. Coincidentally she started having chest discomfort on Friday. This was her left chest and down into her left arm. She's had this off and on briefly in the past but this was severe. It was 10 out of 10 in intensity. She had increased dyspnea and started to wear her oxygen more frequently.  She had some diaphoresis. Her nitroglycerin was old so it would not help the pain.  She actually went to [facility] and I did review the ER records. There was no objective evidence of ischemia. And she refused admission wanting to wait to be seen here in the clinic.  She did have a borderline BNP elevation and a mildly elevated white blood cell count. She continued to have this chest pain over the weekend off and on. She's actually now having about 10 discomfort. It is dull. She's currently not having the arm or jaw radiation. She hasn't had any fevers or chills. She's not describing PND or orthopnea. She's not been having any of dictations, presyncope or syncope.  Allergies  Allergen Reactions  . Celebrex [Celecoxib] Palpitations and Other (See Comments)    Chest pain, tachycardia, diaphoresis  . Niacin Rash  . Varenicline Tartrate Rash    CHANTIX    Current Outpatient Prescriptions  Medication Sig Dispense Refill  . ACCU-CHEK AVIVA PLUS test strip       . albuterol-ipratropium (COMBIVENT) 18-103 MCG/ACT inhaler Inhale 2 puffs into the lungs as needed for wheezing or shortness of breath.       . Cholecalciferol (VITAMIN D) 1000 UNITS capsule Take 1,000 Units by mouth every morning.       [name] esomeprazole (NEXIUM) 40 MG capsule Take 40 mg by mouth as needed (for heartburm/ acid reflux).       [name] ibuprofen (ADVIL,MOTRIN) 200 MG tablet Take 400-600 mg by mouth every 6 (six) hours as needed for pain.       [name] insulin aspart (NOVOLOG) 100 UNIT/ML injection Inject 10-25 Units into the skin 3 (three) times daily before meals. Units given depends on her CBG  readings. < 200 gives 10 units, > 200 gives 20 units, 20-25 units at supper depending on CBG reading      . insulin detemir (LEVEMIR) 100 UNIT/ML injection Inject 45 Units into the skin at bedtime.      [name] lisinopril (PRINIVIL,ZESTRIL) 10 MG tablet Take 10 mg by mouth every morning.      [name] LYRICA 50 MG capsule Take 50 mg by mouth 2 (two) times daily.      . nicotine (NICODERM CQ - DOSED IN MG/24 HOURS) 21 mg/24hr patch Place 21 mg onto the skin daily.      Vladimir Faster Glycol-Propyl Glycol (SYSTANE) 0.4-0.3 % SOLN Apply 1 drop to eye 2 (two) times daily.        No current facility-administered medications for this visit.    Past Medical History  Diagnosis Date  . Asthma   . Cholelithiasis   . Colon polyps   . Coronary artery disease     The pt had stenting of the midsegment of the right coronary artery in 2005 with the Cypher stent. The last catheterization demonstrated an EF of 60%. The right coronary artery stent was patent. The left  main was normal. Circumflex had luminal irregularities. The LAD had luminal irregularities.   . Diabetes mellitus     Type II  . GERD (gastroesophageal reflux disease)   . Hyperlipidemia   .  Tobacco abuse   . Bronchitis   . Shortness of breath     with ambulation  . Arthritis   . Urgency of urination   . COPD (chronic obstructive pulmonary disease)     USES O2 AT NIGHT + PRN IN DAYTIME3L  . Anginal pain     PATIENT STATES DR. Kala Ambriz STATED WAS FINE ON EXAM  . Emphysema   . H/O hiatal hernia   . Complication of anesthesia     no complication, but patient states no tube can be placed in her right nares due to narrowing from fracture  . Hypertension   . Pneumonia     had 4-5 weeks ago  . Fibromyalgia   . 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 Lung cancer 2010    Adenocarcinoma, right lung, node positive    Past Surgical History  Procedure Laterality Date  . Appendectomy  1970  . Gallbladder surgery  1989  . Abdominal hysterectomy  1972  .  Tonsillectomy  1966  . Carpal tunnel release    . Replacement total knee  2001    left  . Neck surgery  2005  . Pneumonectomy      Right Partial, lung cancer confirmed, nod positive  . Right upper lobectomy with lymph node dissection  10/12/2008    Dr Arlyce Dice  . Cardiac catheterization    . Coronary angioplasty  2005  . Portacath placement  12/12/2007  . Port-a-cath removal  05/12/2012    Procedure: REMOVAL PORT-A-CATH;  Surgeon: Melrose Nakayama, MD;  [facility];  Service: Thoracic;  Laterality: Left;  [name] Eye surgery      implants 02/18/12 (Left) 02/25/12 (right eye)  . Rotator cuff repair      RIGHT SHOULDER  2 YRS  . Orif humerus fracture Left 12/14/2012    Procedure: OPEN REDUCTION INTERNAL FIXATION (ORIF) LEFT PROXIMAL HUMERUS FRACTURE;  Surgeon: Mcarthur Rossetti, MD;  Location: [place];  Service: Orthopedics;  Laterality: Left;  . Cholecystectomy    . Back surgery  2007    sept 2014  . Joint replacement      total knee right  . Abdominal hysterectomy      Family History  Problem Relation Age of Onset  . Diabetes Mother   . Diabetes Father   . Heart disease    . Arthritis    . Lung disease    . Cancer    . Asthma      History   Social History  . Marital Status: Widowed    Spouse Name: N/A    Number of Children: N/A  . Years of Education: N/A   Occupational History  . disabled    Social History Main Topics  . Smoking status: Current Every Day Smoker -- 1.00 packs/day for 45 years    Types: Cigarettes  . Smokeless tobacco: Not on file  . Alcohol Use: No  . Drug Use: No  . Sexual Activity: No   Other Topics Concern  . Not on file   Social History Narrative  . No narrative on file    ROS:  Back pain and joint pain.  Otherwise as stated in the HPI and negative for all other systems.  PHYSICAL EXAM BP 156/96  Pulse 99  Ht 5\' 2"  (1.575 m)  Wt 203 lb 6.4 oz (92.262 kg)  BMI 37.19 kg/m2 GENERAL:   Dyspneic but in no acute distress HEENT:   Pupils equal round and reactive, fundi not visualized,  oral mucosa unremarkable NECK:  No jugular venous distention, waveform within normal limits, carotid upstroke brisk and symmetric, no bruits, no thyromegaly LYMPHATICS:  No cervical, inguinal adenopathy LUNGS:  Clear to auscultation bilaterally, distant heart sounds BACK:  No CVA tenderness CHEST:  Unremarkable HEART:  PMI not displaced or sustained,S1 and S2 within normal limits, no S3, no S4, no clicks, no rubs, no murmurs ABD:  Flat, positive bowel sounds normal in frequency in pitch, no bruits, no rebound, no guarding, no midline pulsatile mass, no hepatomegaly, no splenomegaly EXT:  2 plus pulses throughout, no edema, no cyanosis no clubbing SKIN:  No rashes no nodules NEURO:  Cranial nerves II through XII grossly intact, motor grossly intact throughout PSYCH:  Cognitively intact, oriented to person place and time   EKG:  Sinus rhythm, rate 99, left axis deviation, no change from previous  .06/19/2014   ASSESSMENT AND PLAN  UNSTABLE ANGINA:  She will be transported via EMS to the step down unit at [facility]. She will be started on heparin. She should have a cardiac catheterization. She understands this procedure. The patient understands that risks included but are not limited to stroke (1 in 1000), death (1 in 77), kidney failure [usually temporary] (1 in 500), bleeding (1 in 200), allergic reaction [possibly serious] (1 in 200).  The patient understands and agrees to proceed.   TOBACCO ABUSE:  She quit nicotine patch onto day and he should be continued. She has failed stopping smoking in the past.  COPD:  She should continue with her previous medications.  Of note she did have a chest x-ray on the 11th and there were no acute abnormalities.  DM:  Continue previous medications with sliding scale insulin as well.  DYSLIPIDEMIA: I don't see a recent lipid profile. This can be ordered.

## 2014-06-19 NOTE — Progress Notes (Signed)
ANTICOAGULATION CONSULT NOTE - Initial Consult  Pharmacy Consult for heparin Indication: chest pain/ACS  Allergies  Allergen Reactions  . Celebrex [Celecoxib] Palpitations and Other (See Comments)    Chest pain, tachycardia, diaphoresis  . Niacin Rash  . Varenicline Tartrate Rash    CHANTIX    Patient Measurements: Height: 5\' 2"  (157.5 cm) Weight: 201 lb 4.5 oz (91.3 kg) IBW/kg (Calculated) : 50.1 Heparin Dosing Weight: 90kg  Vital Signs: Temp: 98.2 F (36.8 C) (09/14 1400) Temp src: Oral (09/14 1400) BP: 127/104 mmHg (09/14 1445) Pulse Rate: 92 (09/14 1445)  Labs:  Recent Labs  06/16/14 1603  HGB 16.7*  HCT 49.9*  PLT 145*  CREATININE 0.76  TROPONINI <0.30    Estimated Creatinine Clearance: 70.8 ml/min (by C-G formula based on Cr of 0.76).   Medical History: Past Medical History  Diagnosis Date  . Asthma   . Cholelithiasis   . Colon polyps   . Coronary artery disease     The pt had stenting of the midsegment of the right coronary artery in 2005 with the Cypher stent. The last catheterization demonstrated an EF of 60%. The right coronary artery stent was patent. The left  main was normal. Circumflex had luminal irregularities. The LAD had luminal irregularities.   . Diabetes mellitus     Type II  . GERD (gastroesophageal reflux disease)   . Hyperlipidemia   . Tobacco abuse   . Bronchitis   . Arthritis   . COPD (chronic obstructive pulmonary disease)     USES O2 AT NIGHT + PRN IN DAYTIME3L  . Emphysema   . H/O hiatal hernia   . Hypertension   . Fibromyalgia   . Lung cancer 2010    Adenocarcinoma, right lung, node positive   Assessment: 68 year old female who presents to cardiology clinic today with recurrent chest pain. Patient was seen at [facility] last week but refused admission. Not on anticoagulants pta, baseline labs pending, they were normal 9/11. Orders to start heparin for r/o acs.  Goal of Therapy:  Heparin level 0.3-0.7 units/ml Monitor  platelets by anticoagulation protocol: Yes   Plan:  Give 4000 units bolus x 1 Start heparin infusion at 1000 units/hr Check anti-Xa level in 6 hours and daily while on heparin Continue to monitor H&H and platelets  Erin Hearing PharmD., BCPS Clinical Pharmacist Pager [phone] 06/19/2014 3:14 PM

## 2014-06-19 NOTE — H&P (Signed)
See H & P from office visit today.  On arrival pt with chest pressure, will begin IV heparin and IV NTG.  Continue nicoderm patch.  VS- BP elevated so NTG will help with BP as well.  Otherwise she appears comfortable.

## 2014-06-19 NOTE — Progress Notes (Signed)
ANTICOAGULATION CONSULT NOTE - Follow Up Consult  Pharmacy Consult for heparin Indication: USAP  Labs:  Recent Labs  06/19/14 1635 06/19/14 2157 06/19/14 2200  APTT 145*  --   --   LABPROT 13.2  --   --   INR 1.00  --   --   HEPARINUNFRC  --  0.20*  --   CREATININE 0.57  --   --   TROPONINI <0.30  --  <0.30    Assessment: 68yo female subtherapeutic on heparin with initial dosing for USAP.  Goal of Therapy:  Heparin level 0.3-0.7 units/ml   Plan:  Will increase heparin gtt by 3 units/kg/hr to 1300 units/hr and check level in 6-8hr.  Wynona Neat, PharmD, BCPS  06/19/2014,11:35 PM

## 2014-06-20 ENCOUNTER — Encounter (HOSPITAL_COMMUNITY)
Admission: EM | Disposition: A | Discharge status: Home / Self Care | Payer: Medicare Other | Source: Ambulatory Visit | Attending: Cardiology

## 2014-06-20 ENCOUNTER — Encounter (HOSPITAL_COMMUNITY): Payer: Self-pay

## 2014-06-20 DIAGNOSIS — I2510 Atherosclerotic heart disease of native coronary artery without angina pectoris: Principal | ICD-10-CM

## 2014-06-20 HISTORY — PX: LEFT HEART CATHETERIZATION WITH CORONARY ANGIOGRAM: SHX5451

## 2014-06-20 LAB — GLUCOSE, CAPILLARY
GLUCOSE-CAPILLARY: 228 mg/dL — AB (ref 70–99)
Glucose-Capillary: 223 mg/dL — ABNORMAL HIGH (ref 70–99)
Glucose-Capillary: 218 mg/dL — ABNORMAL HIGH (ref 70–99)
Glucose-Capillary: 246 mg/dL — ABNORMAL HIGH (ref 70–99)

## 2014-06-20 LAB — LIPID PANEL
CHOL/HDL RATIO: 4.9 ratio
Cholesterol: 194 mg/dL (ref 0–200)
HDL: 40 mg/dL (ref >39)
LDL CALC: 86 mg/dL (ref 0–99)
Triglycerides: 340 mg/dL — ABNORMAL HIGH (ref <150)
VLDL: 68 mg/dL — ABNORMAL HIGH (ref 0–40)

## 2014-06-20 LAB — CBC
HCT: 45.8 % (ref 36.0–46.0)
Hemoglobin: 15.1 g/dL — ABNORMAL HIGH (ref 12.0–15.0)
MCH: 27.4 pg (ref 26.0–34.0)
MCHC: 33 g/dL (ref 30.0–36.0)
MCV: 83 fL (ref 78.0–100.0)
Platelets: 149 10*3/uL — ABNORMAL LOW (ref 150–400)
RBC: 5.52 MIL/uL — ABNORMAL HIGH (ref 3.87–5.11)
RDW: 14.3 % (ref 11.5–15.5)
WBC: 8.6 10*3/uL (ref 4.0–10.5)

## 2014-06-20 LAB — BASIC METABOLIC PANEL
Anion gap: 11 (ref 5–15)
BUN: 19 mg/dL (ref 6–23)
CALCIUM: 8.9 mg/dL (ref 8.4–10.5)
CO2: 26 mEq/L (ref 19–32)
Chloride: 98 mEq/L (ref 96–112)
Creatinine, Ser: 0.68 mg/dL (ref 0.50–1.10)
GFR calc Af Amer: >90 mL/min (ref >90)
GFR calc non Af Amer: 88 mL/min — ABNORMAL LOW (ref >90)
GLUCOSE: 258 mg/dL — AB (ref 70–99)
POTASSIUM: 4.7 meq/L (ref 3.7–5.3)
SODIUM: 135 meq/L — AB (ref 137–147)

## 2014-06-20 LAB — HEMOGLOBIN A1C
Hgb A1c MFr Bld: 8.9 % — ABNORMAL HIGH (ref <5.7)
Mean Plasma Glucose: 209 mg/dL — ABNORMAL HIGH (ref <117)

## 2014-06-20 LAB — TROPONIN I

## 2014-06-20 LAB — POCT ACTIVATED CLOTTING TIME: ACTIVATED CLOTTING TIME: 670 s

## 2014-06-20 SURGERY — LEFT HEART CATHETERIZATION WITH CORONARY ANGIOGRAM
Anesthesia: LOCAL

## 2014-06-20 MED ORDER — CLOPIDOGREL BISULFATE 300 MG PO TABS
ORAL_TABLET | ORAL | Status: AC
  Filled 2014-06-20: qty 2

## 2014-06-20 MED ORDER — MIDAZOLAM HCL 2 MG/2ML IJ SOLN
INTRAMUSCULAR | Status: AC
  Filled 2014-06-20: qty 2

## 2014-06-20 MED ORDER — ASPIRIN EC 81 MG PO TBEC: 81.0000 mg | DELAYED_RELEASE_TABLET | Freq: Every day | ORAL | Status: DC

## 2014-06-20 MED ORDER — BIVALIRUDIN 250 MG IV SOLR
INTRAVENOUS | Status: AC
  Filled 2014-06-20: qty 250

## 2014-06-20 MED ORDER — SODIUM CHLORIDE 0.9 % IV SOLN
INTRAVENOUS | Status: DC
  Administered 2014-06-20 (×2): via INTRAVENOUS

## 2014-06-20 MED ORDER — NITROGLYCERIN 1 MG/10 ML FOR IR/CATH LAB
INTRA_ARTERIAL | Status: AC
  Filled 2014-06-20: qty 10

## 2014-06-20 MED ORDER — ACETAMINOPHEN 325 MG PO TABS
650.0000 mg | ORAL_TABLET | ORAL | Status: DC | PRN
  Administered 2014-06-20: 650 mg via ORAL
  Filled 2014-06-20: qty 2

## 2014-06-20 MED ORDER — ONDANSETRON HCL 4 MG/2ML IJ SOLN: 4.0000 mg | Freq: Four times a day (QID) | INTRAMUSCULAR | Status: DC | PRN

## 2014-06-20 MED ORDER — FENTANYL CITRATE 0.05 MG/ML IJ SOLN
INTRAMUSCULAR | Status: AC
  Filled 2014-06-20: qty 2

## 2014-06-20 MED ORDER — HEPARIN (PORCINE) IN NACL 2-0.9 UNIT/ML-% IJ SOLN
INTRAMUSCULAR | Status: AC
  Filled 2014-06-20: qty 1000

## 2014-06-20 MED ORDER — LIDOCAINE HCL (PF) 1 % IJ SOLN
INTRAMUSCULAR | Status: AC
  Filled 2014-06-20: qty 30

## 2014-06-20 MED ORDER — HEPARIN SODIUM (PORCINE) 1000 UNIT/ML IJ SOLN
INTRAMUSCULAR | Status: AC
  Filled 2014-06-20: qty 1

## 2014-06-20 MED ORDER — FAMOTIDINE IN NACL 20-0.9 MG/50ML-% IV SOLN
INTRAVENOUS | Status: AC
  Filled 2014-06-20: qty 50

## 2014-06-20 MED ORDER — CLOPIDOGREL BISULFATE 75 MG PO TABS
75.0000 mg | ORAL_TABLET | Freq: Every day | ORAL | Status: DC
  Administered 2014-06-21: 75 mg via ORAL
  Filled 2014-06-20 (×2): qty 1

## 2014-06-20 MED ORDER — VERAPAMIL HCL 2.5 MG/ML IV SOLN
INTRAVENOUS | Status: AC
  Filled 2014-06-20: qty 2

## 2014-06-20 MED ORDER — ATORVASTATIN CALCIUM 40 MG PO TABS
40.0000 mg | ORAL_TABLET | Freq: Every day | ORAL | Status: DC
  Administered 2014-06-20: 40 mg via ORAL
  Filled 2014-06-20 (×2): qty 1

## 2014-06-20 MED ORDER — NICOTINE 21 MG/24HR TD PT24
21.0000 mg | MEDICATED_PATCH | TRANSDERMAL | Status: DC
  Administered 2014-06-20 – 2014-06-21 (×2): 21 mg via TRANSDERMAL
  Filled 2014-06-20 (×3): qty 1

## 2014-06-20 MED ORDER — NITROGLYCERIN IN D5W 200-5 MCG/ML-% IV SOLN: 2.0000 ug/min | INTRAVENOUS | Status: DC

## 2014-06-20 NOTE — Progress Notes (Signed)
CARE MANAGEMENT NOTE 06/20/2014  Patient:  [name], [name]   Account Number:  [id number]  Date Initiated:  06/20/2014  Documentation initiated by:  DAVIS,RHONDA  Subjective/Objective Assessment:   patient seen in cardiologist office for ongoing chest pain and sent to [facility] for iv meds and heart cath.     Action/Plan:   will follow for progress and home needs   Anticipated DC Date:  06/23/2014   Anticipated DC Plan:  HOME/SELF CARE  In-house referral  NA      DC Planning Services  CM consult      PAC Choice  NA   Choice offered to / List presented to:  NA   DME arranged  NA      DME agency  NA     [place] arranged  NA      [place] agency  NA   Status of service:  In process, will continue to follow Medicare Important Message given?   (If response is "NO", the following Medicare IM given date fields will be blank) Date Medicare IM given:   Medicare IM given by:   Date Additional Medicare IM given:   Additional Medicare IM given by:    Discharge Disposition:    Per UR Regulation:  Reviewed for med. necessity/level of care/duration of stay  If discussed at [place] of Stay Meetings, dates discussed:    Comments:  [id number]/Rhonda Davis,RN,BSN,CCM:

## 2014-06-20 NOTE — H&P (View-Only) (Signed)
HPI The patient presents for followup of her known coronary disease. Coincidentally she started having chest discomfort on Friday. This was her left chest and down into her left arm. She's had this off and on briefly in the past but this was severe. It was 10 out of 10 in intensity. She had increased dyspnea and started to wear her oxygen more frequently.  She had some diaphoresis. Her nitroglycerin was old so it would not help the pain.  She actually went to [facility] and I did review the ER records. There was no objective evidence of ischemia. And she refused admission wanting to wait to be seen here in the clinic.  She did have a borderline BNP elevation and a mildly elevated white blood cell count. She continued to have this chest pain over the weekend off and on. She's actually now having about 10 discomfort. It is dull. She's currently not having the arm or jaw radiation. She hasn't had any fevers or chills. She's not describing PND or orthopnea. She's not been having any of dictations, presyncope or syncope.  Allergies  Allergen Reactions  . Celebrex [Celecoxib] Palpitations and Other (See Comments)    Chest pain, tachycardia, diaphoresis  . Niacin Rash  . Varenicline Tartrate Rash    CHANTIX    Current Outpatient Prescriptions  Medication Sig Dispense Refill  . ACCU-CHEK AVIVA PLUS test strip       . albuterol-ipratropium (COMBIVENT) 18-103 MCG/ACT inhaler Inhale 2 puffs into the lungs as needed for wheezing or shortness of breath.       . Cholecalciferol (VITAMIN D) 1000 UNITS capsule Take 1,000 Units by mouth every morning.       [name] esomeprazole (NEXIUM) 40 MG capsule Take 40 mg by mouth as needed (for heartburm/ acid reflux).       [name] ibuprofen (ADVIL,MOTRIN) 200 MG tablet Take 400-600 mg by mouth every 6 (six) hours as needed for pain.       [name] insulin aspart (NOVOLOG) 100 UNIT/ML injection Inject 10-25 Units into the skin 3 (three) times daily before meals. Units given depends on her CBG  readings. < 200 gives 10 units, > 200 gives 20 units, 20-25 units at supper depending on CBG reading      . insulin detemir (LEVEMIR) 100 UNIT/ML injection Inject 45 Units into the skin at bedtime.      [name] lisinopril (PRINIVIL,ZESTRIL) 10 MG tablet Take 10 mg by mouth every morning.      [name] LYRICA 50 MG capsule Take 50 mg by mouth 2 (two) times daily.      . nicotine (NICODERM CQ - DOSED IN MG/24 HOURS) 21 mg/24hr patch Place 21 mg onto the skin daily.      Vladimir Faster Glycol-Propyl Glycol (SYSTANE) 0.4-0.3 % SOLN Apply 1 drop to eye 2 (two) times daily.        No current facility-administered medications for this visit.    Past Medical History  Diagnosis Date  . Asthma   . Cholelithiasis   . Colon polyps   . Coronary artery disease     The pt had stenting of the midsegment of the right coronary artery in 2005 with the Cypher stent. The last catheterization demonstrated an EF of 60%. The right coronary artery stent was patent. The left  main was normal. Circumflex had luminal irregularities. The LAD had luminal irregularities.   . Diabetes mellitus     Type II  . GERD (gastroesophageal reflux disease)   . Hyperlipidemia   .  Tobacco abuse   . Bronchitis   . Shortness of breath     with ambulation  . Arthritis   . Urgency of urination   . COPD (chronic obstructive pulmonary disease)     USES O2 AT NIGHT + PRN IN DAYTIME3L  . Anginal pain     PATIENT STATES DR. Kavonte Bearse STATED WAS FINE ON EXAM  . Emphysema   . H/O hiatal hernia   . Complication of anesthesia     no complication, but patient states no tube can be placed in her right nares due to narrowing from fracture  . Hypertension   . Pneumonia     had 4-5 weeks ago  . Fibromyalgia   . 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 Lung cancer 2010    Adenocarcinoma, right lung, node positive    Past Surgical History  Procedure Laterality Date  . Appendectomy  1970  . Gallbladder surgery  1989  . Abdominal hysterectomy  1972  .  Tonsillectomy  1966  . Carpal tunnel release    . Replacement total knee  2001    left  . Neck surgery  2005  . Pneumonectomy      Right Partial, lung cancer confirmed, nod positive  . Right upper lobectomy with lymph node dissection  10/12/2008    Dr Arlyce Dice  . Cardiac catheterization    . Coronary angioplasty  2005  . Portacath placement  12/12/2007  . Port-a-cath removal  05/12/2012    Procedure: REMOVAL PORT-A-CATH;  Surgeon: Melrose Nakayama, MD;  Location: [place];  Service: Thoracic;  Laterality: Left;  [name] Eye surgery      implants 02/18/12 (Left) 02/25/12 (right eye)  . Rotator cuff repair      RIGHT SHOULDER  2 YRS  . Orif humerus fracture Left 12/14/2012    Procedure: OPEN REDUCTION INTERNAL FIXATION (ORIF) LEFT PROXIMAL HUMERUS FRACTURE;  Surgeon: Mcarthur Rossetti, MD;  Location: [place];  Service: Orthopedics;  Laterality: Left;  . Cholecystectomy    . Back surgery  2007    sept 2014  . Joint replacement      total knee right  . Abdominal hysterectomy      Family History  Problem Relation Age of Onset  . Diabetes Mother   . Diabetes Father   . Heart disease    . Arthritis    . Lung disease    . Cancer    . Asthma      History   Social History  . Marital Status: Widowed    Spouse Name: N/A    Number of Children: N/A  . Years of Education: N/A   Occupational History  . disabled    Social History Main Topics  . Smoking status: Current Every Day Smoker -- 1.00 packs/day for 45 years    Types: Cigarettes  . Smokeless tobacco: Not on file  . Alcohol Use: No  . Drug Use: No  . Sexual Activity: No   Other Topics Concern  . Not on file   Social History Narrative  . No narrative on file    ROS:  Back pain and joint pain.  Otherwise as stated in the HPI and negative for all other systems.  PHYSICAL EXAM BP 156/96  Pulse 99  Ht 5\' 2"  (1.575 m)  Wt 203 lb 6.4 oz (92.262 kg)  BMI 37.19 kg/m2 GENERAL:   Dyspneic but in no acute distress HEENT:   Pupils equal round and reactive, fundi not visualized,  oral mucosa unremarkable NECK:  No jugular venous distention, waveform within normal limits, carotid upstroke brisk and symmetric, no bruits, no thyromegaly LYMPHATICS:  No cervical, inguinal adenopathy LUNGS:  Clear to auscultation bilaterally, distant heart sounds BACK:  No CVA tenderness CHEST:  Unremarkable HEART:  PMI not displaced or sustained,S1 and S2 within normal limits, no S3, no S4, no clicks, no rubs, no murmurs ABD:  Flat, positive bowel sounds normal in frequency in pitch, no bruits, no rebound, no guarding, no midline pulsatile mass, no hepatomegaly, no splenomegaly EXT:  2 plus pulses throughout, no edema, no cyanosis no clubbing SKIN:  No rashes no nodules NEURO:  Cranial nerves II through XII grossly intact, motor grossly intact throughout PSYCH:  Cognitively intact, oriented to person place and time   EKG:  Sinus rhythm, rate 99, left axis deviation, no change from previous  .06/19/2014   ASSESSMENT AND PLAN  UNSTABLE ANGINA:  She will be transported via EMS to the step down unit at [facility]. She will be started on heparin. She should have a cardiac catheterization. She understands this procedure. The patient understands that risks included but are not limited to stroke (1 in 1000), death (1 in 21), kidney failure [usually temporary] (1 in 500), bleeding (1 in 200), allergic reaction [possibly serious] (1 in 200).  The patient understands and agrees to proceed.   TOBACCO ABUSE:  She quit nicotine patch onto day and he should be continued. She has failed stopping smoking in the past.  COPD:  She should continue with her previous medications.  Of note she did have a chest x-ray on the 11th and there were no acute abnormalities.  DM:  Continue previous medications with sliding scale insulin as well.  DYSLIPIDEMIA: I don't see a recent lipid profile. This can be ordered.

## 2014-06-20 NOTE — CV Procedure (Signed)
[name] is a 68 y.o. female   [id number]  [id number] LOCATION:  FACILITY: [facility]  PHYSICIAN: Troy Sine, MD, [facility] 1946-05-17   DATE OF PROCEDURE:  06/20/2014     CARDIAC CATHETERIZATION/PERCUTANEOUS CORONARY INTERVENTION    HISTORY: Ms. [name] is a 68 year old female who is status post prior stenting to RCA in 2005.  She has a history of tobacco abuse, diabetes mellitus, type II, GERD, COPD, history of adenocarcinoma of the right lung , who was admitted to the hospital yesterday with unstable angina symptoms.  Initial troponins are negative.  She is now referred for definitive cardiac catheterization and possible percutaneous coronary intervention.   PROCEDURE:  Left heart catheterization: Coronary angiography, left ventriculography; percutaneous coronary intervention to the left circumflex vessel with PTCA/DES stenting with a Resolute 2.5x14 mm DES stent.  The patient was brought to the second floor [facility] cath lab in the fasting state. The patient was premedicated with Versed 2 mg and fentanyl 25 mcg. A right radial approach was utilized after an Allen's test verified adequate circulation. The right radial artery was punctured via the Seldinger technique, and a 6 [place] Glidesheath Slender was inserted without difficulty.  A radial cocktail consisting of Verapamil, IV nitroglycerin, and lidocaine was administered. Weight adjusted heparin was administered. A safety J wire was advanced into the ascending aorta. Diagnostic catheterization was done with 5 [place] Tig 4.0 catheter. A 5 French pigtail catheter was used for left ventriculography.  With the demonstration of a 95% stenosis in the circumflex vessel.  The decision was made to perform percutaneous coronary intervention.  She received an additional 1 mg of Versed and 25 mcg of fentanyl.  Bivalirudin bolus plus infusion was administered.  Plavix 600 mg was administered orally.  A 6 [place] XB LAD 3.5 guide was used  for the procedure and a prowater wire was advanced into the Om 2 vessels.  ACT was therapeutic.  Predilatation was made with a 2.5x12 mm Emerge balloon at 6 atmospheres for 2 inflations.  This was mild narrowing proximal to the OM ostium.  A 2.5x14 mm Resolute integrity DES stent was inserted , which covered the AV groove circumflex proximal to the ostium of the marginal vessel.  The more distal AV groove circumflex was small caliber and essentially supplies a left atrial circumflex branch.  The DES stent was dilated x2 at 10 and 12 atmospheres.  An  Euphora 2.75x12 mm balloon was used for post stent dilatation up to 2.71 mm.  Angiography confirmed excellent angiographic result the 95% stenosis being reduced to 0%.  There was no narrowing in the small AV groove circumflex branch, which supplies the left atrial circumflex vessel. A TR radial band was applied for hemostasis. The patient left the catheterization laboratory in stable condition chest pain free.  HEMODYNAMICS:   Central Aorta: 135/74  Left Ventricle: 135/12  ANGIOGRAPHY:   The left main coronary artery was angiographically normal and bifurcated into the LAD and left circumflex coronary artery.   The LAD was angiographically normal and gave rise to 2 major diagonal vessels and several septal perforating arteries. The vessel extended to the LV apex.  There was diffuse proximal narrowing in the diagonal vessel-1 of 40%.  The left circumflex coronary artery was a moderate size vessel that gave rise to a large OM1 vessel.  There was 20% narrowing prior to giving rise to home to vessel.  It was 95% stenosis commencing at the ostium of the OM 2 vessel.  The AV groove circumflex beyond the stone to vessel was small caliber and essentially supplies the left atrial circumflex branch.  The RCA had previously placed stents in the proximal to region just before the acute margin.  There was mild 30% narrowing very proximally before the stent.  There  was mild 30% narrowing within the stent in its distal third of this vessel.  This 20% distal RCA narrowing before the PDA.  The RCA supplied the PDA and PLA vessel, which was free of significant disease.  Left ventriculography revealed normal global LV contractility without focal segmental wall motion abnormalities. There was no evidence for mitral regurgitation.  Estimated ejection fraction is 60%.  Following percutaneous coronary intervention, the 2.5 x 14 mm Resolute DES stent was positioned in the AV groove circumflex and extended into the proximal portion of the OM-2 vessels to cover the mild AV narrowing and ostial stenosis.  This was postdilated to 2.71 mm.  The 95% stenosis was reduced to 0%.    Total contrast used: 170 cc    IMPRESSION:  Normal global LV function with an ejection fraction of 60%.  Coronary obstructive disease with 40% diffuse proximal narrowing in the first diagonal branch of the LAD; 95% stenosis in the O2 branch of the circumflex coronary artery; and 30% proximal RCA narrowing with previously placed tandem stents in the proximal and mid RCA with a smooth 30% narrowing within the stented segment 20% distally RCA stenosis.  Successful percutaneous coronary intervention to the left circumflex coronary artery with ultimate DES stenting with a Resolute integrity 2.5x14 mm stent, postdilated to 2.71 mm with the 95% ostial OM-2 stenosis being reduced to 0%.   RECOMMENDATION:  Continued medical therapy.  The patient should continue with DAPT treatment for a minimum of one year.  Smoking cessation is strongly encouraged.  Recommend aggressive lipid intervention with a target LDL of less than 70.    Troy Sine, MD, [facility] 06/20/2014 9:14 AM

## 2014-06-20 NOTE — Interval H&P Note (Signed)
Cath Lab Visit (complete for each Cath Lab visit)  Clinical Evaluation Leading to the Procedure:   ACS: No.  Non-ACS:    Anginal Classification: CCS III  Anti-ischemic medical therapy: Maximal Therapy (2 or more classes of medications)  Non-Invasive Test Results: No non-invasive testing performed  Prior CABG: No previous CABG      History and Physical Interval Note:  06/20/2014 7:44 AM  [name]  has presented today for surgery, with the diagnosis of CP  The various methods of treatment have been discussed with the patient and family. After consideration of risks, benefits and other options for treatment, the patient has consented to  Procedure(s): LEFT HEART CATHETERIZATION WITH CORONARY ANGIOGRAM (N/A) as a surgical intervention .  The patient's history has been reviewed, patient examined, no change in status, stable for surgery.  I have reviewed the patient's chart and labs.  Questions were answered to the patient's satisfaction.     Amaar Oshita A

## 2014-06-21 ENCOUNTER — Telehealth: Payer: Self-pay | Admitting: Cardiology

## 2014-06-21 DIAGNOSIS — I200 Unstable angina: Secondary | ICD-10-CM

## 2014-06-21 DIAGNOSIS — F17200 Nicotine dependence, unspecified, uncomplicated: Secondary | ICD-10-CM

## 2014-06-21 DIAGNOSIS — J449 Chronic obstructive pulmonary disease, unspecified: Secondary | ICD-10-CM

## 2014-06-21 DIAGNOSIS — I10 Essential (primary) hypertension: Secondary | ICD-10-CM

## 2014-06-21 DIAGNOSIS — I2510 Atherosclerotic heart disease of native coronary artery without angina pectoris: Secondary | ICD-10-CM | POA: Diagnosis not present

## 2014-06-21 LAB — BASIC METABOLIC PANEL
Anion gap: 12 (ref 5–15)
BUN: 14 mg/dL (ref 6–23)
CHLORIDE: 103 meq/L (ref 96–112)
CO2: 22 mEq/L (ref 19–32)
Calcium: 8.8 mg/dL (ref 8.4–10.5)
Creatinine, Ser: 0.65 mg/dL (ref 0.50–1.10)
GFR, EST NON AFRICAN AMERICAN: 89 mL/min — AB (ref >90)
GLUCOSE: 211 mg/dL — AB (ref 70–99)
Potassium: 4.6 mEq/L (ref 3.7–5.3)
Sodium: 137 mEq/L (ref 137–147)

## 2014-06-21 LAB — CBC
HCT: 45.4 % (ref 36.0–46.0)
Hemoglobin: 15.1 g/dL — ABNORMAL HIGH (ref 12.0–15.0)
MCH: 27.5 pg (ref 26.0–34.0)
MCHC: 33.3 g/dL (ref 30.0–36.0)
MCV: 82.5 fL (ref 78.0–100.0)
Platelets: 140 10*3/uL — ABNORMAL LOW (ref 150–400)
RBC: 5.5 MIL/uL — AB (ref 3.87–5.11)
RDW: 14.2 % (ref 11.5–15.5)
WBC: 7.8 10*3/uL (ref 4.0–10.5)

## 2014-06-21 LAB — GLUCOSE, CAPILLARY
Glucose-Capillary: 244 mg/dL — ABNORMAL HIGH (ref 70–99)
Glucose-Capillary: 292 mg/dL — ABNORMAL HIGH (ref 70–99)

## 2014-06-21 MED ORDER — ASPIRIN 81 MG PO TBEC: 81.0000 mg | DELAYED_RELEASE_TABLET | Freq: Every day | ORAL | Status: DC

## 2014-06-21 MED ORDER — CLOPIDOGREL BISULFATE 75 MG PO TABS: 75.0000 mg | ORAL_TABLET | Freq: Every day | ORAL | Status: DC

## 2014-06-21 MED ORDER — NITROGLYCERIN 0.4 MG SL SUBL: 0.4000 mg | SUBLINGUAL_TABLET | SUBLINGUAL | Status: AC | PRN

## 2014-06-21 MED ORDER — METOPROLOL TARTRATE 25 MG PO TABS: 25.0000 mg | ORAL_TABLET | Freq: Two times a day (BID) | ORAL | Status: DC

## 2014-06-21 MED ORDER — ATORVASTATIN CALCIUM 40 MG PO TABS: 40.0000 mg | ORAL_TABLET | Freq: Every day | ORAL | Status: DC

## 2014-06-21 MED FILL — Sodium Chloride IV Soln 0.9%: INTRAVENOUS | Qty: 50 | Status: AC

## 2014-06-21 NOTE — Discharge Instructions (Signed)
Coronary Angiogram with Stent Coronary angiography with stent placement is a procedure to widen or open a narrow blood vessel of the heart (coronary artery). When a coronary artery becomes partially blocked, it decreases blood flow to that area. This may lead to chest pain or a heart attack (myocardial infarction). Arteries may become blocked by cholesterol buildup (plaque) in the lining or wall.  A stent is a small piece of metal that looks like a mesh or a spring. Stent placement may be done right after a coronary angiography in which a blocked artery is found or as a treatment for a heart attack.  LET [facility] CARE PROVIDER KNOW ABOUT:  Any allergies you have.   All medicines you are taking, including vitamins, herbs, eye drops, creams, and over-the-counter medicines.   Previous problems you or members of your family have had with the use of anesthetics.   Any blood disorders you have.   Previous surgeries you have had.   Medical conditions you have. RISKS AND COMPLICATIONS Generally, coronary angiography with stent is a safe procedure. However, problems can occur and include:  Damage to the heart or its blood vessels.   A return of blockage.   Bleeding, infection, or bruising at the insertion site.   A collection of blood under the skin (hematoma) at the insertion site.  Blood clot in another part of the body.   Kidney injury.   Allergic reaction to the dye or contrast used.   Bleeding into the abdomen (retroperitoneal bleeding). BEFORE THE PROCEDURE  Do not eat or drink anything after midnight on the night before the procedure or as directed by your health care provider.  Ask your health care provider about changing or stopping your regular medicines. This is especially important if you are taking diabetes medicines or blood thinners.  Your health care provider will make sure you understand the procedure as well as the risks and potential problems  associated with the procedure.  PROCEDURE  You may be given a medicine to help you relax before and during the procedure (sedative). This medicine will be given through an IV tube that is put into one of your veins.   The area where the catheter will be inserted will be shaved and cleaned. This is usually done in the groin but may be done in the fold of your arm (near your elbow) or in the wrist.   A medicine will be given to numb the area where the catheter will be inserted (local anesthetic).   The catheter will be inserted into an artery using a guide wire. A type of X-ray (fluoroscopy) will be used to help guide the catheter to the opening of the blocked artery.   A dye will then be injected into the catheter, and X-rays will be taken. The dye will help to show where any narrowing or blockages are located in the heart arteries.   A tiny wire will be guided to the blocked spot, and a balloon will be inflated to make the artery wider. The stent will be expanded and will crush the plaque into the wall of the vessel. The stent will hold the area open like a scaffolding and improve the blood flow.   Sometimes the artery may be made wider using a laser or other tools to remove plaque.   When the blood flow is better, the catheter will be removed. The lining of the artery will grow over the stent, which stays where it was placed.  AFTER THE PROCEDURE  If the procedure is done through the leg, you will be kept in bed lying flat for about 6 hours. You will be instructed to not bend or cross your legs.   The insertion site will be checked frequently.   The pulse in your feet or wrist will be checked frequently.   Additional blood tests, X-rays, and electrocardiography may be done. Document Released: 03/29/2003 Document Revised: 02/06/2014 Document Reviewed: 03/31/2013 [facility] Patient Information 2015 [place], [place]. This information is not intended to replace advice given to you  by your health care provider. Make sure you discuss any questions you have with your health care provider.

## 2014-06-21 NOTE — Progress Notes (Signed)
CARDIAC REHAB PHASE I   PRE:  Rate/Rhythm: 80 SR    BP: sitting 134/66    SaO2: 94 RA  MODE:  Ambulation: 350 ft   POST:  Rate/Rhythm: 106 ST    BP: sitting 125/69     SaO2: 92 RA  Pt used RW, RA. SaO2 maintained 92-94 RA however pt very DOE. This is her normal. Rest x2 for 350 ft. Spent 30 min discussing smoking cessation. Pt sts she feels more motivated than ever. Unfortunately her dght smokes in the house as well. Encouraged daughter to quit as well. Resources and fake cigarette given. Pt has high copay for CRPII or pulm rehab.  [phone]   Darrick Meigs CES, ACSM 06/21/2014 12:41 PM

## 2014-06-21 NOTE — Progress Notes (Signed)
Patient dc'd at this time, no complications v/s WDL. Taken downstairs via tech in wheelchair

## 2014-06-21 NOTE — Progress Notes (Signed)
Inpatient Diabetes Program Recommendations  AACE/ADA: New Consensus Statement on Inpatient Glycemic Control  Target Ranges:  Prepandial:   less than 140 mg/dL      Peak postprandial:   less than 180 mg/dL (1-2 hours)      Critically ill patients:  140 - 180 mg/dL  Pager:  [phone] Hours:  8 am-10pm   Reason for Visit: Elevated glucose:  Results for [name], [name] (MRN [id number]) as of 06/21/2014 08:53  Ref. Range 06/20/2014 09:56 06/20/2014 11:53 06/20/2014 16:59 06/20/2014 21:40 06/21/2014 08:18  Glucose-Capillary Latest Range: 70-99 mg/dL 223 (H) 218 (H) 246 (H) 228 (H) 244 (H)    Inpatient Diabetes Program Recommendations Insulin - Basal: Increase Levemir to home dose: 45 units qhs  Courtney Heys PhD, RN, BC-ADM Diabetes Coordinator  Office:  [phone] Team Pager:  [phone]

## 2014-06-21 NOTE — Progress Notes (Signed)
Subjective:  No CP/SOB, S/P OM2 PCI/Stent with DES, DAPT  Objective:  Temp:  [98.1 F (36.7 C)-98.8 F (37.1 C)] 98.3 F (36.8 C) (09/16 0815) Pulse Rate:  [63-83] 75 (09/16 0348) Resp:  [13-30] 14 (09/16 0348) BP: (107-153)/(39-78) 153/78 mmHg (09/16 0815) SpO2:  [93 %-99 %] 98 % (09/16 0815) Weight change:   Intake/Output from previous day: 09/15 0701 - 09/16 0700 In: 3221.6 [P.O.:1380; I.V.:1841.6] Out: 2675 [Urine:2675]  Intake/Output from this shift:    Physical Exam: General appearance: alert and no distress Neck: no adenopathy, no carotid bruit, no JVD, supple, symmetrical, trachea midline and thyroid not enlarged, symmetric, no tenderness/mass/nodules Lungs: clear to auscultation bilaterally Heart: regular rate and rhythm, S1, S2 normal, no murmur, click, rub or gallop Extremities: extremities normal, atraumatic, no cyanosis or edema  Lab Results: Results for orders placed during the hospital encounter of 06/19/14 (from the past 48 hour(s))  MRSA PCR SCREENING     Status: None   Collection Time    06/19/14  2:24 PM      Result Value Ref Range   MRSA by PCR NEGATIVE  NEGATIVE   Comment:            The GeneXpert MRSA Assay (FDA     approved for NASAL specimens     only), is one component of a     comprehensive MRSA colonization     surveillance program. It is not     intended to diagnose MRSA     infection nor to guide or     monitor treatment for     MRSA infections.  COMPREHENSIVE METABOLIC PANEL     Status: Abnormal   Collection Time    06/19/14  4:35 PM      Result Value Ref Range   Sodium 137  137 - 147 mEq/L   Potassium 4.2  3.7 - 5.3 mEq/L   Chloride 100  96 - 112 mEq/L   CO2 22  19 - 32 mEq/L   Glucose, Bld 243 (*) 70 - 99 mg/dL   BUN 16  6 - 23 mg/dL   Creatinine, Ser 0.57  0.50 - 1.10 mg/dL   Calcium 9.2  8.4 - 10.5 mg/dL   Total Protein 6.8  6.0 - 8.3 g/dL   Albumin 3.5  3.5 - 5.2 g/dL   AST 12  0 - 37 U/L   ALT 12  0 - 35 U/L   Alkaline Phosphatase 98  39 - 117 U/L   Total Bilirubin 0.4  0.3 - 1.2 mg/dL   GFR calc non Af Amer >90  >90 mL/min   GFR calc Af Amer >90  >90 mL/min   Comment: (NOTE)     The eGFR has been calculated using the CKD EPI equation.     This calculation has not been validated in all clinical situations.     eGFR's persistently <90 mL/min signify possible Chronic Kidney     Disease.   Anion gap 15  5 - 15  MAGNESIUM     Status: None   Collection Time    06/19/14  4:35 PM      Result Value Ref Range   Magnesium 1.9  1.5 - 2.5 mg/dL  TSH     Status: None   Collection Time    06/19/14  4:35 PM      Result Value Ref Range   TSH 0.709  0.350 - 4.500 uIU/mL  T4, FREE  Status: None   Collection Time    06/19/14  4:35 PM      Result Value Ref Range   Free T4 1.20  0.80 - 1.80 ng/dL   Comment: Performed at [facility]  TROPONIN I     Status: None   Collection Time    06/19/14  4:35 PM      Result Value Ref Range   Troponin I <0.30  <0.30 ng/mL   Comment:            Due to the release kinetics of cTnI,     a negative result within the first hours     of the onset of symptoms does not rule out     myocardial infarction with certainty.     If myocardial infarction is still suspected,     repeat the test at appropriate intervals.  HEMOGLOBIN A1C     Status: Abnormal   Collection Time    06/19/14  4:35 PM      Result Value Ref Range   Hemoglobin A1C 8.9 (*) <5.7 %   Comment: (NOTE)                                                                               According to the ADA Clinical Practice Recommendations for 2011, when     HbA1c is used as a screening test:      >=6.5%   Diagnostic of Diabetes Mellitus               (if abnormal result is confirmed)     5.7-6.4%   Increased risk of developing Diabetes Mellitus     References:Diagnosis and Classification of Diabetes Mellitus,Diabetes     CVEL,3810,17(PZWCH 1):S62-S69 and Standards of Medical Care in              Diabetes - 2011,Diabetes Care,2011,34 (Suppl 1):S11-S61.   Mean Plasma Glucose 209 (*) <117 mg/dL   Comment: Performed at [facility]     Status: None   Collection Time    06/19/14  4:35 PM      Result Value Ref Range   Prothrombin Time 13.2  11.6 - 15.2 seconds   INR 1.00  0.00 - 1.49  APTT     Status: Abnormal   Collection Time    06/19/14  4:35 PM      Result Value Ref Range   aPTT 145 (*) 24 - 37 seconds   Comment:            IF BASELINE aPTT IS ELEVATED,     SUGGEST PATIENT RISK ASSESSMENT     BE USED TO DETERMINE APPROPRIATE     ANTICOAGULANT THERAPY.  GLUCOSE, CAPILLARY     Status: Abnormal   Collection Time    06/19/14  4:57 PM      Result Value Ref Range   Glucose-Capillary 253 (*) 70 - 99 mg/dL   Comment 1 Capillary Sample    GLUCOSE, CAPILLARY     Status: Abnormal   Collection Time    06/19/14  9:30 PM      Result Value Ref Range   Glucose-Capillary 251 (*) 70 - 99  mg/dL   Comment 1 Capillary Sample    HEPARIN LEVEL (UNFRACTIONATED)     Status: Abnormal   Collection Time    06/19/14  9:57 PM      Result Value Ref Range   Heparin Unfractionated 0.20 (*) 0.30 - 0.70 IU/mL   Comment:            IF HEPARIN RESULTS ARE BELOW     EXPECTED VALUES, AND PATIENT     DOSAGE HAS BEEN CONFIRMED,     SUGGEST FOLLOW UP TESTING     OF ANTITHROMBIN III LEVELS.  TROPONIN I     Status: None   Collection Time    06/19/14 10:00 PM      Result Value Ref Range   Troponin I <0.30  <0.30 ng/mL   Comment:            Due to the release kinetics of cTnI,     a negative result within the first hours     of the onset of symptoms does not rule out     myocardial infarction with certainty.     If myocardial infarction is still suspected,     repeat the test at appropriate intervals.  TROPONIN I     Status: None   Collection Time    06/20/14  3:06 AM      Result Value Ref Range   Troponin I <0.30  <0.30 ng/mL   Comment:            Due to the release  kinetics of cTnI,     a negative result within the first hours     of the onset of symptoms does not rule out     myocardial infarction with certainty.     If myocardial infarction is still suspected,     repeat the test at appropriate intervals.  BASIC METABOLIC PANEL     Status: Abnormal   Collection Time    06/20/14  3:06 AM      Result Value Ref Range   Sodium 135 (*) 137 - 147 mEq/L   Potassium 4.7  3.7 - 5.3 mEq/L   Chloride 98  96 - 112 mEq/L   CO2 26  19 - 32 mEq/L   Glucose, Bld 258 (*) 70 - 99 mg/dL   BUN 19  6 - 23 mg/dL   Creatinine, Ser 0.68  0.50 - 1.10 mg/dL   Calcium 8.9  8.4 - 10.5 mg/dL   GFR calc non Af Amer 88 (*) >90 mL/min   GFR calc Af Amer >90  >90 mL/min   Comment: (NOTE)     The eGFR has been calculated using the CKD EPI equation.     This calculation has not been validated in all clinical situations.     eGFR's persistently <90 mL/min signify possible Chronic Kidney     Disease.   Anion gap 11  5 - 15  CBC     Status: Abnormal   Collection Time    06/20/14  3:06 AM      Result Value Ref Range   WBC 8.6  4.0 - 10.5 K/uL   RBC 5.52 (*) 3.87 - 5.11 MIL/uL   Hemoglobin 15.1 (*) 12.0 - 15.0 g/dL   HCT 45.8  36.0 - 46.0 %   MCV 83.0  78.0 - 100.0 fL   MCH 27.4  26.0 - 34.0 pg   MCHC 33.0  30.0 - 36.0 g/dL   RDW 14.3  11.5 -  15.5 %   Platelets 149 (*) 150 - 400 K/uL  LIPID PANEL     Status: Abnormal   Collection Time    06/20/14  3:06 AM      Result Value Ref Range   Cholesterol 194  0 - 200 mg/dL   Triglycerides 340 (*) <150 mg/dL   HDL 40  >39 mg/dL   Total CHOL/HDL Ratio 4.9     VLDL 68 (*) 0 - 40 mg/dL   LDL Cholesterol 86  0 - 99 mg/dL   Comment:            Total Cholesterol/HDL:CHD Risk     Coronary Heart Disease Risk Table                         Men   Women      1/2 Average Risk   3.4   3.3      Average Risk       5.0   4.4      2 X Average Risk   9.6   7.1      3 X Average Risk  23.4   11.0                Use the calculated  Patient Ratio     above and the CHD Risk Table     to determine the patient's CHD Risk.                ATP III CLASSIFICATION (LDL):      <100     mg/dL   Optimal      100-129  mg/dL   Near or Above                        Optimal      130-159  mg/dL   Borderline      160-189  mg/dL   High      >190     mg/dL   Very High  POCT ACTIVATED CLOTTING TIME     Status: None   Collection Time    06/20/14  8:30 AM      Result Value Ref Range   Activated Clotting Time 670    GLUCOSE, CAPILLARY     Status: Abnormal   Collection Time    06/20/14  9:56 AM      Result Value Ref Range   Glucose-Capillary 223 (*) 70 - 99 mg/dL  GLUCOSE, CAPILLARY     Status: Abnormal   Collection Time    06/20/14 11:53 AM      Result Value Ref Range   Glucose-Capillary 218 (*) 70 - 99 mg/dL   Comment 1 Capillary Sample    GLUCOSE, CAPILLARY     Status: Abnormal   Collection Time    06/20/14  4:59 PM      Result Value Ref Range   Glucose-Capillary 246 (*) 70 - 99 mg/dL  GLUCOSE, CAPILLARY     Status: Abnormal   Collection Time    06/20/14  9:40 PM      Result Value Ref Range   Glucose-Capillary 228 (*) 70 - 99 mg/dL   Comment 1 Capillary Sample    BASIC METABOLIC PANEL     Status: Abnormal   Collection Time    06/21/14  2:00 AM      Result Value Ref Range   Sodium 137  137 - 147 mEq/L   Potassium  4.6  3.7 - 5.3 mEq/L   Comment: HEMOLYSIS AT THIS LEVEL MAY AFFECT RESULT   Chloride 103  96 - 112 mEq/L   CO2 22  19 - 32 mEq/L   Glucose, Bld 211 (*) 70 - 99 mg/dL   BUN 14  6 - 23 mg/dL   Creatinine, Ser 0.65  0.50 - 1.10 mg/dL   Calcium 8.8  8.4 - 10.5 mg/dL   GFR calc non Af Amer 89 (*) >90 mL/min   GFR calc Af Amer >90  >90 mL/min   Comment: (NOTE)     The eGFR has been calculated using the CKD EPI equation.     This calculation has not been validated in all clinical situations.     eGFR's persistently <90 mL/min signify possible Chronic Kidney     Disease.   Anion gap 12  5 - 15  CBC      Status: Abnormal   Collection Time    06/21/14  2:00 AM      Result Value Ref Range   WBC 7.8  4.0 - 10.5 K/uL   RBC 5.50 (*) 3.87 - 5.11 MIL/uL   Hemoglobin 15.1 (*) 12.0 - 15.0 g/dL   HCT 45.4  36.0 - 46.0 %   MCV 82.5  78.0 - 100.0 fL   MCH 27.5  26.0 - 34.0 pg   MCHC 33.3  30.0 - 36.0 g/dL   RDW 14.2  11.5 - 15.5 %   Platelets 140 (*) 150 - 400 K/uL  GLUCOSE, CAPILLARY     Status: Abnormal   Collection Time    06/21/14  8:18 AM      Result Value Ref Range   Glucose-Capillary 244 (*) 70 - 99 mg/dL   Comment 1 Capillary Sample      Imaging: Imaging results have been reviewed  Assessment/Plan:   1. Active Problems: 2.   Unstable angina 3. HTN  Time Spent Directly with Patient:  20 minutes  Length of Stay:  LOS: 2 days   POD # 1 PCI/Stent OM2 ostium by Dr. Leda Gauze radially with DES. On DAPT with ASA/Plavix. Exam benign. Labs OK. No CP. Will adjust BB. OK for D/C home . ROV with Dr. Percival Spanish.   Mikenna Bunkley J 06/21/2014, 11:01 AM

## 2014-06-22 DIAGNOSIS — I2510 Atherosclerotic heart disease of native coronary artery without angina pectoris: Secondary | ICD-10-CM | POA: Diagnosis not present

## 2014-06-22 NOTE — Discharge Summary (Signed)
Patient ID: [name],  MRN: [id number], DOB/AGE: April 06, 1946 68 y.o.  Admit date: 06/19/2014 Discharge date: 06/22/2014  Primary Care Provider: [name], MD Primary Cardiologist: Dr Claiborne Billings  Discharge Diagnoses Principal Problem:   Unstable angina Active Problems:   CAD- OM2 DES 06/20/14   DIABETES, TYPE 2   HYPERTENSION   C O P D    Procedures: Cath PCI with OM2 DES 06/20/14   Hospital Course:  The pt is an 68 year old female who is status post prior stenting to RCA in 2005. She has a history of tobacco abuse, diabetes mellitus, type II, GERD, COPD, history of adenocarcinoma of the right lung. She was admitted to the hospital 06/19/14 with unstable angina symptoms. Initial troponins were negative. She underwent definitive cardiac catheterization and subsequent percutaneous coronary intervention with a DES to her OM2 on 06/20/14  By Dr Claiborne Billings. She tolerated this well and Dr Gwenlyn Found feels she can be discharged 06/21/14. [name]   Discharge Vitals:  Blood pressure 125/69, pulse 74, temperature 98.3 F (36.8 C), temperature source Oral, resp. rate 14, height 5\' 2"  (1.575 m), weight 199 lb 1.2 oz (90.3 kg), SpO2 98.00%.    Labs: No results found for this or any previous visit (from the past 24 hour(s)).  Disposition:      Follow-up Information   Follow up with Minus Breeding, MD. (office will call you)    Specialty:  Cardiology   Contact information:   [address] [place] [address] [phone]       Discharge Medications:    Medication List         ACCU-CHEK AVIVA PLUS test strip  Generic drug:  glucose blood     albuterol (2.5 MG/3ML) 0.083% nebulizer solution  Commonly known as:  PROVENTIL  Take 2.5 mg by nebulization every 6 (six) hours as needed (bronchitis).     aspirin 81 MG EC tablet  Take 1 tablet (81 mg total) by mouth daily.     atorvastatin 40 MG tablet  Commonly known as:  LIPITOR  Take 1 tablet (40 mg total) by mouth daily  at 6 PM.     clopidogrel 75 MG tablet  Commonly known as:  PLAVIX  Take 1 tablet (75 mg total) by mouth daily with breakfast.     COMBIVENT RESPIMAT 20-100 MCG/ACT Aers respimat  Generic drug:  Ipratropium-Albuterol  Inhale 1 puff into the lungs 2 (two) times daily as needed for wheezing.     esomeprazole 40 MG capsule  Commonly known as:  NEXIUM  Take 40 mg by mouth as needed (for heartburm/ acid reflux).     ibuprofen 200 MG tablet  Commonly known as:  ADVIL,MOTRIN  Take 600 mg by mouth every 4 (four) hours as needed (pain).     insulin aspart 100 UNIT/ML injection  Commonly known as:  novoLOG  Inject 10-25 Units into the skin 3 (three) times daily before meals. Units given depends on her CBG readings. < 200 gives 10 units, > 200 gives 20 units, 20-25 units at supper depending on CBG reading     ipratropium 0.02 % nebulizer solution  Commonly known as:  ATROVENT  Take 0.5 mg by nebulization every 4 (four) hours as needed (bronchitis).     LEVEMIR FLEXTOUCH 100 UNIT/ML Pen  Generic drug:  Insulin Detemir  Inject 45 Units into the skin at bedtime.     lisinopril 10 MG tablet  Commonly known as:  PRINIVIL,ZESTRIL  Take 10  mg by mouth daily.     metoprolol tartrate 25 MG tablet  Commonly known as:  LOPRESSOR  Take 1 tablet (25 mg total) by mouth 2 (two) times daily.     nicotine 21 mg/24hr patch  Commonly known as:  NICODERM CQ - dosed in mg/24 hours  Place 21 mg onto the skin daily.     nitroGLYCERIN 0.4 MG SL tablet  Commonly known as:  NITROSTAT  Place 1 tablet (0.4 mg total) under the tongue every 5 (five) minutes x 3 doses as needed for chest pain.     pregabalin 50 MG capsule  Commonly known as:  LYRICA  Take 50 mg by mouth 2 (two) times daily.     SYSTANE 0.4-0.3 % Soln  Generic drug:  Polyethyl Glycol-Propyl Glycol  Place 1 drop into both eyes 2 (two) times daily.     Vitamin D 1000 UNITS capsule  Take 1,000 Units by mouth daily.         Duration of  Discharge Encounter: Greater than 30 minutes including physician time.  Angelena Form PA-C 06/22/2014 1:31 PM

## 2014-06-23 NOTE — Telephone Encounter (Signed)
Closed encounter °

## 2014-06-26 ENCOUNTER — Encounter: Payer: Self-pay | Admitting: Cardiovascular Disease

## 2014-07-11 ENCOUNTER — Encounter: Payer: Self-pay | Admitting: Cardiology

## 2014-07-11 ENCOUNTER — Ambulatory Visit: Payer: Medicare Other | Admitting: Cardiology

## 2014-07-11 ENCOUNTER — Ambulatory Visit (INDEPENDENT_AMBULATORY_CARE_PROVIDER_SITE_OTHER): Payer: Medicare Other | Admitting: Cardiology

## 2014-07-11 VITALS — BP 135/81 | HR 48 | Ht 62.0 in | Wt 211.2 lb

## 2014-07-11 DIAGNOSIS — R001 Bradycardia, unspecified: Secondary | ICD-10-CM

## 2014-07-11 DIAGNOSIS — F17200 Nicotine dependence, unspecified, uncomplicated: Secondary | ICD-10-CM

## 2014-07-11 DIAGNOSIS — Z720 Tobacco use: Secondary | ICD-10-CM

## 2014-07-11 DIAGNOSIS — J449 Chronic obstructive pulmonary disease, unspecified: Secondary | ICD-10-CM

## 2014-07-11 DIAGNOSIS — E785 Hyperlipidemia, unspecified: Secondary | ICD-10-CM

## 2014-07-11 DIAGNOSIS — I2510 Atherosclerotic heart disease of native coronary artery without angina pectoris: Secondary | ICD-10-CM

## 2014-07-11 MED ORDER — METOPROLOL TARTRATE 25 MG PO TABS: ORAL_TABLET | ORAL | Status: DC

## 2014-07-11 NOTE — Patient Instructions (Signed)
Decrease Metoprolol to 12.5 mg twice a day    Your physician recommends that you schedule a follow-up appointment in: 2 months with Dr.Hochrein  Monday 09/11/14 at 11:00 am

## 2014-07-11 NOTE — Assessment & Plan Note (Signed)
Stopped smoking, coughing improved.

## 2014-07-11 NOTE — Assessment & Plan Note (Signed)
Stopped smoking 06/19/2014 congratulated on this milestone.

## 2014-07-11 NOTE — Assessment & Plan Note (Signed)
No further chest pain, no complaints.  EKG stable except for HR

## 2014-07-11 NOTE — Progress Notes (Signed)
07/11/2014   PCP: [name], MD   Chief Complaint  Patient presents with  . Follow-up    pt denies chest pain. pt states that she does experience sob and swelling in her feet due to neuropathy    Primary Cardiologist:Dr. Vita Barley    HPI: 68 year old female who is status post prior stenting to RCA in 2005. She has a history of tobacco abuse, diabetes mellitus, type II, GERD, COPD, history of adenocarcinoma of the right lung. She was admitted to the hospital 06/19/14 with unstable angina symptoms. Initial troponins were negative. She underwent definitive cardiac catheterization and subsequent percutaneous coronary intervention with a DES to her OM2 on 06/20/14 By Dr Claiborne Billings.   She is back today for post hospital follow up.   She has no chest pain she feels much better she has more energy.  Her heart rate is slower today- she does state she feels off balance since the stent was placed.  A little lightheaded. The beta blocker is new for her.  She continues with some back pain and will have one more injection she was instructed not to stop Plavix.    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  Her weight is increased she is aware of the increase in plans begin water aerobics once her back is improved.  Allergies  Allergen Reactions  . Celebrex [Celecoxib] Palpitations and Other (See Comments)    Chest pain, tachycardia, diaphoresis  . Niacin Rash  . Varenicline Tartrate Rash    CHANTIX    Current Outpatient Prescriptions  Medication Sig Dispense Refill  . ACCU-CHEK AVIVA PLUS test strip       . albuterol (PROVENTIL) (2.5 MG/3ML) 0.083% nebulizer solution Take 2.5 mg by nebulization every 6 (six) hours as needed (bronchitis).      [name] aspirin EC 81 MG EC tablet Take 1 tablet (81 mg total) by mouth daily.      [name] atorvastatin (LIPITOR) 40 MG tablet Take 1 tablet (40 mg total) by mouth daily at 6 PM.  30 tablet  11  . Cholecalciferol (VITAMIN  D) 1000 UNITS capsule Take 1,000 Units by mouth daily.       . clopidogrel (PLAVIX) 75 MG tablet Take 1 tablet (75 mg total) by mouth daily with breakfast.  30 tablet  11  . esomeprazole (NEXIUM) 40 MG capsule Take 40 mg by mouth as needed (for heartburm/ acid reflux).       [name] ibuprofen (ADVIL,MOTRIN) 200 MG tablet Take 600 mg by mouth every 4 (four) hours as needed (pain).       . insulin aspart (NOVOLOG) 100 UNIT/ML injection Inject 10-25 Units into the skin 3 (three) times daily before meals. Units given depends on her CBG readings. < 200 gives 10 units, > 200 gives 20 units, 20-25 units at supper depending on CBG reading      . Insulin Detemir (LEVEMIR FLEXTOUCH) 100 UNIT/ML Pen Inject 45 Units into the skin at bedtime.      [name] ipratropium (ATROVENT) 0.02 % nebulizer solution Take 0.5 mg by nebulization every 4 (four) hours as needed (bronchitis).      . Ipratropium-Albuterol (COMBIVENT RESPIMAT) 20-100 MCG/ACT AERS respimat Inhale 1 puff into the lungs 2 (two) times daily as needed for wheezing.      [name] lisinopril (PRINIVIL,ZESTRIL) 10 MG tablet Take 10 mg by mouth daily.       . metoprolol tartrate (LOPRESSOR) 25  MG tablet Take 1 tablet (25 mg total) by mouth 2 (two) times daily.  60 tablet  11  . nicotine (NICODERM CQ - DOSED IN MG/24 HOURS) 21 mg/24hr patch Place 21 mg onto the skin daily.      . nitroGLYCERIN (NITROSTAT) 0.4 MG SL tablet Place 1 tablet (0.4 mg total) under the tongue every 5 (five) minutes x 3 doses as needed for chest pain.  25 tablet  2  . Polyethyl Glycol-Propyl Glycol (SYSTANE) 0.4-0.3 % SOLN Place 1 drop into both eyes 2 (two) times daily.       . pregabalin (LYRICA) 50 MG capsule Take 50 mg by mouth 2 (two) times daily.       No current facility-administered medications for this visit.    Past Medical History  Diagnosis Date  . Asthma   . Cholelithiasis   . Colon polyps   . Coronary artery disease     The pt had stenting of the midsegment of the right coronary  artery in 2005 with the Cypher stent. The last catheterization demonstrated an EF of 60%. The right coronary artery stent was patent. The left  main was normal. Circumflex had luminal irregularities. The LAD had luminal irregularities.   . Diabetes mellitus     Type II  . GERD (gastroesophageal reflux disease)   . Hyperlipidemia   . Tobacco abuse   . Bronchitis   . Arthritis   . COPD (chronic obstructive pulmonary disease)     USES O2 AT NIGHT + PRN IN DAYTIME3L  . Emphysema   . H/O hiatal hernia   . Hypertension   . Fibromyalgia   . Lung cancer 2010    Adenocarcinoma, right lung, node positive    Past Surgical History  Procedure Laterality Date  . Appendectomy  1970  . Gallbladder surgery  1989  . Abdominal hysterectomy  1972  . Tonsillectomy  1966  . Carpal tunnel release    . Replacement total knee  2001    left  . Neck surgery  2005  . Pneumonectomy      Right Partial, lung cancer confirmed, nod positive  . Right upper lobectomy with lymph node dissection  10/12/2008    Dr Arlyce Dice  . Coronary angioplasty  2005  . Portacath placement  12/12/2007  . Port-a-cath removal  05/12/2012    Procedure: REMOVAL PORT-A-CATH;  Surgeon: Melrose Nakayama, MD;  Location: [facility];  Service: Thoracic;  Laterality: Left;  [name] Eye surgery      implants 02/18/12 (Left) 02/25/12 (right eye)  . Rotator cuff repair      RIGHT SHOULDER  2 YRS  . Orif humerus fracture Left 12/14/2012    Procedure: OPEN REDUCTION INTERNAL FIXATION (ORIF) LEFT PROXIMAL HUMERUS FRACTURE;  Surgeon: Mcarthur Rossetti, MD;  Location: [facility];  Service: Orthopedics;  Laterality: Left;  . Abdominal hysterectomy    . Joint replacement      HMC:NOBSJGG:EZ colds or fevers, + weight changes Skin:no rashes or ulcers HEENT:no blurred vision, no congestion CV:see HPI PUL:see HPI GI:no diarrhea constipation or melena, no indigestion GU:no hematuria, no dysuria MS:no joint pain, no claudication Neuro:no syncope, +  lightheadedness Endo:+ diabetes-glucose up and down, no thyroid disease  Wt Readings from Last 3 Encounters:  07/11/14 211 lb 3.2 oz (95.8 kg)  06/20/14 199 lb 1.2 oz (90.3 kg)  06/20/14 199 lb 1.2 oz (90.3 kg)    PHYSICAL EXAM BP 135/81  Pulse 48  Ht 5\' 2"  (1.575 m)  Wt 211 lb 3.2 oz (95.8 kg)  BMI 38.62 kg/m2 General:Pleasant affect, NAD Skin:Warm and dry, brisk capillary refill HEENT:normocephalic, sclera clear, mucus membranes moist Neck:supple, no JVD, no bruits  Heart:S1S2 RRR without murmur, gallup, rub or click Lungs:clear without rales, rhonchi, or wheezes EEF:EOFHQ, soft, non tender, + BS, do not palpate liver spleen or masses Ext:no lower ext edema, 2+ pedal pulses, 2+ radial pulses- rt wrist cath site without bruising or hematoma.  Neuro:alert and oriented, MAE, follows commands, + facial symmetry EKG:S brady, rate of 48 no other acute changes compared to the 06/21/14 EKG except HR.  ASSESSMENT AND PLAN CAD- OM2 DES 06/20/14 No further chest pain, no complaints.  EKG stable except for HR  Symptomatic sinus bradycardia Mild lightheadedness and off-balance on beta blocker. Heart rate today is 48 Will decrease Lopressor to 12-1/2 twice a day she'll followup with Dr. Percival Spanish in 2 months.  TOBACCO ABUSE Stopped smoking 06/19/2014 congratulated on this milestone.  Hyperlipidemia LDL goal <70 Lipid Panel     Component Value Date/Time   CHOL 194 06/20/2014 0306   TRIG 340* 06/20/2014 0306   HDL 40 06/20/2014 0306   CHOLHDL 4.9 06/20/2014 0306   VLDL 68* 06/20/2014 0306   LDLCALC 86 06/20/2014 0306   Med changed to lipitor with hospitalization.  COPD (chronic obstructive pulmonary disease) with chronic bronchitis Stopped smoking, coughing improved.

## 2014-07-11 NOTE — Assessment & Plan Note (Signed)
Mild lightheadedness and off-balance on beta blocker. Heart rate today is 48 Will decrease Lopressor to 12-1/2 twice a day she'll followup with Dr. Percival Spanish in 2 months.

## 2014-07-11 NOTE — Assessment & Plan Note (Signed)
Lipid Panel     Component Value Date/Time   CHOL 194 06/20/2014 0306   TRIG 340* 06/20/2014 0306   HDL 40 06/20/2014 0306   CHOLHDL 4.9 06/20/2014 0306   VLDL 68* 06/20/2014 0306   LDLCALC 86 06/20/2014 0306   Med changed to lipitor with hospitalization.

## 2014-07-14 ENCOUNTER — Telehealth: Payer: Self-pay | Admitting: Cardiology

## 2014-07-14 NOTE — Telephone Encounter (Signed)
OK to make change as she requests.  Call Ms. Myhre with the results and send results to [name], MD

## 2014-07-14 NOTE — Telephone Encounter (Signed)
Pt called in stating that she was in to see Mickel Baas on 10/6 due to her heart rate being low. Mickel Baas changed her dosage of metoprolol to 1/2 tab in am and 1/2 in pm . Now [name] states that her heart rate is anywhere between 95 and 100. She would like to know if it is ok for her to take 1 tab in the am and 1/2 in the pm. Please call  Thanks

## 2014-07-14 NOTE — Telephone Encounter (Signed)
Metoprolol decreased to 12.5mg  bid 07/11/14 due to heart rate of 48.  Since her HR checks at home have drastically increased which makes her SOB.  Patient would like to try 25mg  qam and 12.5mg  qpm.  Will send message to Dr. Percival Spanish for review and advise.

## 2014-07-14 NOTE — Telephone Encounter (Signed)
Called and told patient she can take Metoprolol 25mg  qam and 12.5mg  qpm per Dr. Percival Spanish.  States she took her BP at 3:30pm and it was 80/68.  No complaints of dizziness she has just been lying around and taking it easy today.  Asked her to check her BP while we were on the phone - 146/80 with a HR of 91.  Told to check BP and HR and keep a log and bring in for next visit.  Patient voiced understanding and will call if she has any problems.

## 2014-07-18 ENCOUNTER — Encounter (HOSPITAL_COMMUNITY): Payer: Self-pay | Admitting: Emergency Medicine

## 2014-07-18 ENCOUNTER — Inpatient Hospital Stay (HOSPITAL_COMMUNITY)
Admission: EM | Admit: 2014-07-18 | Discharge: 2014-07-21 | DRG: 481 | Disposition: A | Discharge status: Skilled Nursing Facility | Payer: Medicare Other | Attending: Internal Medicine | Admitting: Internal Medicine

## 2014-07-18 ENCOUNTER — Emergency Department (HOSPITAL_COMMUNITY): Payer: Medicare Other

## 2014-07-18 DIAGNOSIS — Z87891 Personal history of nicotine dependence: Secondary | ICD-10-CM | POA: Diagnosis not present

## 2014-07-18 DIAGNOSIS — E785 Hyperlipidemia, unspecified: Secondary | ICD-10-CM | POA: Diagnosis present

## 2014-07-18 DIAGNOSIS — Z9981 Dependence on supplemental oxygen: Secondary | ICD-10-CM

## 2014-07-18 DIAGNOSIS — Z794 Long term (current) use of insulin: Secondary | ICD-10-CM | POA: Diagnosis not present

## 2014-07-18 DIAGNOSIS — M96662 Fracture of femur following insertion of orthopedic implant, joint prosthesis, or bone plate, left leg: Secondary | ICD-10-CM | POA: Diagnosis not present

## 2014-07-18 DIAGNOSIS — Z6838 Body mass index (BMI) 38.0-38.9, adult: Secondary | ICD-10-CM | POA: Diagnosis not present

## 2014-07-18 DIAGNOSIS — Z7982 Long term (current) use of aspirin: Secondary | ICD-10-CM

## 2014-07-18 DIAGNOSIS — D696 Thrombocytopenia, unspecified: Secondary | ICD-10-CM | POA: Diagnosis not present

## 2014-07-18 DIAGNOSIS — I25118 Atherosclerotic heart disease of native coronary artery with other forms of angina pectoris: Secondary | ICD-10-CM

## 2014-07-18 DIAGNOSIS — J45909 Unspecified asthma, uncomplicated: Secondary | ICD-10-CM | POA: Diagnosis present

## 2014-07-18 DIAGNOSIS — I10 Essential (primary) hypertension: Secondary | ICD-10-CM | POA: Diagnosis present

## 2014-07-18 DIAGNOSIS — Z22322 Carrier or suspected carrier of Methicillin resistant Staphylococcus aureus: Secondary | ICD-10-CM | POA: Diagnosis not present

## 2014-07-18 DIAGNOSIS — I959 Hypotension, unspecified: Secondary | ICD-10-CM | POA: Diagnosis not present

## 2014-07-18 DIAGNOSIS — M797 Fibromyalgia: Secondary | ICD-10-CM | POA: Diagnosis not present

## 2014-07-18 DIAGNOSIS — C3490 Malignant neoplasm of unspecified part of unspecified bronchus or lung: Secondary | ICD-10-CM | POA: Diagnosis present

## 2014-07-18 DIAGNOSIS — W19XXXA Unspecified fall, initial encounter: Secondary | ICD-10-CM | POA: Diagnosis present

## 2014-07-18 DIAGNOSIS — S72402A Unspecified fracture of lower end of left femur, initial encounter for closed fracture: Secondary | ICD-10-CM

## 2014-07-18 DIAGNOSIS — R0902 Hypoxemia: Secondary | ICD-10-CM

## 2014-07-18 DIAGNOSIS — D649 Anemia, unspecified: Secondary | ICD-10-CM | POA: Diagnosis present

## 2014-07-18 DIAGNOSIS — K219 Gastro-esophageal reflux disease without esophagitis: Secondary | ICD-10-CM | POA: Diagnosis not present

## 2014-07-18 DIAGNOSIS — Z96652 Presence of left artificial knee joint: Secondary | ICD-10-CM | POA: Diagnosis present

## 2014-07-18 DIAGNOSIS — J9610 Chronic respiratory failure, unspecified whether with hypoxia or hypercapnia: Secondary | ICD-10-CM | POA: Diagnosis present

## 2014-07-18 DIAGNOSIS — M9712XA Periprosthetic fracture around internal prosthetic left knee joint, initial encounter: Secondary | ICD-10-CM

## 2014-07-18 DIAGNOSIS — S72409A Unspecified fracture of lower end of unspecified femur, initial encounter for closed fracture: Secondary | ICD-10-CM | POA: Diagnosis present

## 2014-07-18 DIAGNOSIS — J449 Chronic obstructive pulmonary disease, unspecified: Secondary | ICD-10-CM | POA: Diagnosis not present

## 2014-07-18 DIAGNOSIS — Z955 Presence of coronary angioplasty implant and graft: Secondary | ICD-10-CM

## 2014-07-18 DIAGNOSIS — E119 Type 2 diabetes mellitus without complications: Secondary | ICD-10-CM

## 2014-07-18 DIAGNOSIS — I2510 Atherosclerotic heart disease of native coronary artery without angina pectoris: Secondary | ICD-10-CM | POA: Diagnosis present

## 2014-07-18 DIAGNOSIS — T84043A Periprosthetic fracture around internal prosthetic left knee joint, initial encounter: Secondary | ICD-10-CM

## 2014-07-18 DIAGNOSIS — J441 Chronic obstructive pulmonary disease with (acute) exacerbation: Secondary | ICD-10-CM

## 2014-07-18 DIAGNOSIS — F17200 Nicotine dependence, unspecified, uncomplicated: Secondary | ICD-10-CM | POA: Diagnosis present

## 2014-07-18 DIAGNOSIS — T8489XA Other specified complication of internal orthopedic prosthetic devices, implants and grafts, initial encounter: Principal | ICD-10-CM | POA: Diagnosis present

## 2014-07-18 DIAGNOSIS — J700 Acute pulmonary manifestations due to radiation: Secondary | ICD-10-CM

## 2014-07-18 DIAGNOSIS — E1165 Type 2 diabetes mellitus with hyperglycemia: Secondary | ICD-10-CM | POA: Diagnosis present

## 2014-07-18 DIAGNOSIS — I2583 Coronary atherosclerosis due to lipid rich plaque: Secondary | ICD-10-CM

## 2014-07-18 DIAGNOSIS — S7292XA Unspecified fracture of left femur, initial encounter for closed fracture: Secondary | ICD-10-CM

## 2014-07-18 HISTORY — DX: Dependence on supplemental oxygen: Z99.81

## 2014-07-18 HISTORY — DX: Type 2 diabetes mellitus without complications: E11.9

## 2014-07-18 HISTORY — DX: Unspecified chronic bronchitis: J42

## 2014-07-18 LAB — GLUCOSE, CAPILLARY: Glucose-Capillary: 312 mg/dL — ABNORMAL HIGH (ref 70–99)

## 2014-07-18 LAB — CBC WITH DIFFERENTIAL/PLATELET
Basophils Absolute: 0 10*3/uL (ref 0.0–0.1)
Basophils Relative: 0 % (ref 0–1)
EOS ABS: 0.1 10*3/uL (ref 0.0–0.7)
EOS PCT: 1 % (ref 0–5)
HEMATOCRIT: 44 % (ref 36.0–46.0)
HEMOGLOBIN: 14.4 g/dL (ref 12.0–15.0)
LYMPHS ABS: 1.6 10*3/uL (ref 0.7–4.0)
Lymphocytes Relative: 18 % (ref 12–46)
MCH: 27.1 pg (ref 26.0–34.0)
MCHC: 32.7 g/dL (ref 30.0–36.0)
MCV: 82.9 fL (ref 78.0–100.0)
MONO ABS: 0.6 10*3/uL (ref 0.1–1.0)
Monocytes Relative: 7 % (ref 3–12)
Neutro Abs: 6.6 10*3/uL (ref 1.7–7.7)
Neutrophils Relative %: 74 % (ref 43–77)
Platelets: 136 10*3/uL — ABNORMAL LOW (ref 150–400)
RBC: 5.31 MIL/uL — AB (ref 3.87–5.11)
RDW: 14.6 % (ref 11.5–15.5)
WBC: 9 10*3/uL (ref 4.0–10.5)

## 2014-07-18 LAB — PROTIME-INR
INR: 0.9 (ref 0.00–1.49)
Prothrombin Time: 12.2 seconds (ref 11.6–15.2)

## 2014-07-18 LAB — SURGICAL PCR SCREEN
MRSA, PCR: POSITIVE — AB
STAPHYLOCOCCUS AUREUS: POSITIVE — AB

## 2014-07-18 LAB — BASIC METABOLIC PANEL
Anion gap: 14 (ref 5–15)
BUN: 19 mg/dL (ref 6–23)
CALCIUM: 10 mg/dL (ref 8.4–10.5)
CO2: 25 mEq/L (ref 19–32)
Chloride: 97 mEq/L (ref 96–112)
Creatinine, Ser: 0.8 mg/dL (ref 0.50–1.10)
GFR calc Af Amer: 86 mL/min — ABNORMAL LOW (ref >90)
GFR, EST NON AFRICAN AMERICAN: 74 mL/min — AB (ref >90)
Glucose, Bld: 317 mg/dL — ABNORMAL HIGH (ref 70–99)
POTASSIUM: 5.9 meq/L — AB (ref 3.7–5.3)
Sodium: 136 mEq/L — ABNORMAL LOW (ref 137–147)

## 2014-07-18 LAB — CBG MONITORING, ED: GLUCOSE-CAPILLARY: 271 mg/dL — AB (ref 70–99)

## 2014-07-18 LAB — POTASSIUM: Potassium: 4.7 mEq/L (ref 3.7–5.3)

## 2014-07-18 MED ORDER — PREGABALIN 25 MG PO CAPS
50.0000 mg | ORAL_CAPSULE | Freq: Two times a day (BID) | ORAL | Status: DC
  Administered 2014-07-18 – 2014-07-21 (×5): 50 mg via ORAL
  Filled 2014-07-18 (×5): qty 2

## 2014-07-18 MED ORDER — CALCIUM CARBONATE ANTACID 500 MG PO CHEW
3.0000 | CHEWABLE_TABLET | Freq: Every day | ORAL | Status: DC | PRN
  Filled 2014-07-18 (×2): qty 3

## 2014-07-18 MED ORDER — INSULIN DETEMIR 100 UNIT/ML ~~LOC~~ SOLN
40.0000 [IU] | Freq: Every day | SUBCUTANEOUS | Status: DC
  Administered 2014-07-18: 40 [IU] via SUBCUTANEOUS
  Filled 2014-07-18 (×2): qty 0.4

## 2014-07-18 MED ORDER — ACETAMINOPHEN 325 MG PO TABS
650.0000 mg | ORAL_TABLET | Freq: Four times a day (QID) | ORAL | Status: DC | PRN
  Administered 2014-07-20 – 2014-07-21 (×2): 650 mg via ORAL
  Filled 2014-07-18 (×2): qty 2

## 2014-07-18 MED ORDER — LISINOPRIL 10 MG PO TABS
10.0000 mg | ORAL_TABLET | Freq: Every day | ORAL | Status: DC
  Administered 2014-07-18: 10 mg via ORAL
  Filled 2014-07-18 (×3): qty 1

## 2014-07-18 MED ORDER — MORPHINE SULFATE 4 MG/ML IJ SOLN
4.0000 mg | INTRAMUSCULAR | Status: AC | PRN
  Administered 2014-07-18 (×3): 4 mg via INTRAVENOUS
  Filled 2014-07-18 (×4): qty 1

## 2014-07-18 MED ORDER — POLYVINYL ALCOHOL 1.4 % OP SOLN
1.0000 [drp] | Freq: Two times a day (BID) | OPHTHALMIC | Status: DC
  Administered 2014-07-18 – 2014-07-21 (×6): 1 [drp] via OPHTHALMIC
  Filled 2014-07-18: qty 15

## 2014-07-18 MED ORDER — MORPHINE SULFATE 4 MG/ML IJ SOLN
4.0000 mg | Freq: Once | INTRAMUSCULAR | Status: AC
  Administered 2014-07-18: 4 mg via INTRAVENOUS
  Filled 2014-07-18: qty 1

## 2014-07-18 MED ORDER — INSULIN ASPART 100 UNIT/ML ~~LOC~~ SOLN
0.0000 [IU] | Freq: Every day | SUBCUTANEOUS | Status: DC
  Administered 2014-07-18: 4 [IU] via SUBCUTANEOUS

## 2014-07-18 MED ORDER — MUPIROCIN 2 % EX OINT
1.0000 "application " | TOPICAL_OINTMENT | Freq: Two times a day (BID) | CUTANEOUS | Status: DC
  Administered 2014-07-19 – 2014-07-21 (×6): 1 via NASAL
  Filled 2014-07-18 (×3): qty 22

## 2014-07-18 MED ORDER — ASPIRIN EC 81 MG PO TBEC
81.0000 mg | DELAYED_RELEASE_TABLET | Freq: Every day | ORAL | Status: DC
  Administered 2014-07-18 – 2014-07-21 (×3): 81 mg via ORAL
  Filled 2014-07-18 (×4): qty 1

## 2014-07-18 MED ORDER — HYDROCODONE-ACETAMINOPHEN 5-325 MG PO TABS
1.0000 | ORAL_TABLET | Freq: Four times a day (QID) | ORAL | Status: DC | PRN
  Administered 2014-07-18 – 2014-07-19 (×3): 1 via ORAL
  Filled 2014-07-18 (×3): qty 1

## 2014-07-18 MED ORDER — SODIUM CHLORIDE 0.9 % IJ SOLN
3.0000 mL | Freq: Two times a day (BID) | INTRAMUSCULAR | Status: DC
  Administered 2014-07-18: 3 mL via INTRAVENOUS

## 2014-07-18 MED ORDER — METOPROLOL TARTRATE 12.5 MG HALF TABLET
12.5000 mg | ORAL_TABLET | Freq: Two times a day (BID) | ORAL | Status: DC
  Administered 2014-07-18: 12.5 mg via ORAL
  Filled 2014-07-18 (×5): qty 2

## 2014-07-18 MED ORDER — VITAMIN D 1000 UNITS PO CAPS: 1000.0000 [IU] | ORAL_CAPSULE | Freq: Every day | ORAL | Status: DC

## 2014-07-18 MED ORDER — NITROGLYCERIN 0.4 MG SL SUBL
0.4000 mg | SUBLINGUAL_TABLET | SUBLINGUAL | Status: DC | PRN
  Administered 2014-07-19: 0.4 mg via SUBLINGUAL

## 2014-07-18 MED ORDER — SENNA 8.6 MG PO TABS
1.0000 | ORAL_TABLET | Freq: Two times a day (BID) | ORAL | Status: DC
  Administered 2014-07-18 – 2014-07-21 (×4): 8.6 mg via ORAL
  Filled 2014-07-18 (×7): qty 1

## 2014-07-18 MED ORDER — VITAMIN D3 25 MCG (1000 UNIT) PO TABS
1000.0000 [IU] | ORAL_TABLET | Freq: Every day | ORAL | Status: DC
  Administered 2014-07-20 – 2014-07-21 (×2): 1000 [IU] via ORAL
  Filled 2014-07-18 (×3): qty 1

## 2014-07-18 MED ORDER — ONDANSETRON HCL 4 MG PO TABS: 4.0000 mg | ORAL_TABLET | Freq: Four times a day (QID) | ORAL | Status: DC | PRN

## 2014-07-18 MED ORDER — INSULIN ASPART 100 UNIT/ML ~~LOC~~ SOLN
0.0000 [IU] | Freq: Three times a day (TID) | SUBCUTANEOUS | Status: DC
  Administered 2014-07-19: 8 [IU] via SUBCUTANEOUS
  Administered 2014-07-20 (×2): 5 [IU] via SUBCUTANEOUS
  Administered 2014-07-20: 3 [IU] via SUBCUTANEOUS
  Administered 2014-07-21 (×2): 5 [IU] via SUBCUTANEOUS

## 2014-07-18 MED ORDER — INFLUENZA VAC SPLIT QUAD 0.5 ML IM SUSY
0.5000 mL | PREFILLED_SYRINGE | INTRAMUSCULAR | Status: AC
  Administered 2014-07-20: 0.5 mL via INTRAMUSCULAR
  Filled 2014-07-18 (×3): qty 0.5

## 2014-07-18 MED ORDER — ACETAMINOPHEN 650 MG RE SUPP: 650.0000 mg | Freq: Four times a day (QID) | RECTAL | Status: DC | PRN

## 2014-07-18 MED ORDER — ONDANSETRON HCL 4 MG/2ML IJ SOLN
4.0000 mg | Freq: Four times a day (QID) | INTRAMUSCULAR | Status: DC | PRN
  Administered 2014-07-19: 4 mg via INTRAVENOUS

## 2014-07-18 MED ORDER — ATORVASTATIN CALCIUM 40 MG PO TABS
40.0000 mg | ORAL_TABLET | Freq: Every day | ORAL | Status: DC
  Administered 2014-07-20: 40 mg via ORAL
  Filled 2014-07-18 (×3): qty 1

## 2014-07-18 MED ORDER — CHLORHEXIDINE GLUCONATE CLOTH 2 % EX PADS
6.0000 | MEDICATED_PAD | Freq: Every day | CUTANEOUS | Status: DC
  Administered 2014-07-19 – 2014-07-21 (×3): 6 via TOPICAL

## 2014-07-18 MED ORDER — HYDROMORPHONE HCL 1 MG/ML IJ SOLN
0.5000 mg | INTRAMUSCULAR | Status: DC | PRN
  Administered 2014-07-18 – 2014-07-21 (×4): 0.5 mg via INTRAVENOUS
  Filled 2014-07-18 (×4): qty 1

## 2014-07-18 MED ORDER — ALBUTEROL SULFATE (2.5 MG/3ML) 0.083% IN NEBU
2.5000 mg | INHALATION_SOLUTION | Freq: Four times a day (QID) | RESPIRATORY_TRACT | Status: DC | PRN
  Administered 2014-07-19: 2.5 mg via RESPIRATORY_TRACT
  Filled 2014-07-18 (×2): qty 3

## 2014-07-18 MED ORDER — INSULIN ASPART 100 UNIT/ML ~~LOC~~ SOLN
6.0000 [IU] | Freq: Three times a day (TID) | SUBCUTANEOUS | Status: DC
Start: 2014-07-19 — End: 2014-07-21
  Administered 2014-07-20 – 2014-07-21 (×5): 6 [IU] via SUBCUTANEOUS

## 2014-07-18 MED ORDER — HYDROMORPHONE HCL 1 MG/ML IJ SOLN
1.0000 mg | Freq: Once | INTRAMUSCULAR | Status: DC
  Filled 2014-07-18: qty 1

## 2014-07-18 MED ORDER — NICOTINE 21 MG/24HR TD PT24
21.0000 mg | MEDICATED_PATCH | Freq: Every day | TRANSDERMAL | Status: DC
  Administered 2014-07-18 – 2014-07-21 (×4): 21 mg via TRANSDERMAL
  Filled 2014-07-18 (×5): qty 1

## 2014-07-18 MED ORDER — POLYETHYL GLYCOL-PROPYL GLYCOL 0.4-0.3 % OP SOLN: 1.0000 [drp] | Freq: Two times a day (BID) | OPHTHALMIC | Status: DC

## 2014-07-18 MED ORDER — PANTOPRAZOLE SODIUM 40 MG PO TBEC
40.0000 mg | DELAYED_RELEASE_TABLET | Freq: Every day | ORAL | Status: DC
  Administered 2014-07-20 – 2014-07-21 (×2): 40 mg via ORAL
  Filled 2014-07-18 (×3): qty 1

## 2014-07-18 MED ORDER — CLOPIDOGREL BISULFATE 75 MG PO TABS
75.0000 mg | ORAL_TABLET | Freq: Every day | ORAL | Status: DC
  Administered 2014-07-18: 75 mg via ORAL
  Filled 2014-07-18 (×2): qty 1

## 2014-07-18 MED ORDER — HYDROMORPHONE HCL 1 MG/ML IJ SOLN
1.0000 mg | Freq: Once | INTRAMUSCULAR | Status: AC
  Administered 2014-07-18: 1 mg via INTRAVENOUS

## 2014-07-18 MED ORDER — HEPARIN SODIUM (PORCINE) 5000 UNIT/ML IJ SOLN
5000.0000 [IU] | Freq: Three times a day (TID) | INTRAMUSCULAR | Status: DC
  Administered 2014-07-18: 5000 [IU] via SUBCUTANEOUS
  Filled 2014-07-18 (×4): qty 1

## 2014-07-18 NOTE — ED Notes (Signed)
Iv therapy returned call

## 2014-07-18 NOTE — Consult Note (Signed)
Cardiology: [name]  Reason for Consult: Pre-Op Clearance Referring Physician:  [name] is an 68 y.o. female.  HPI:  68 year old female who is status post prior stenting to RCA in 2005. She has a history of tobacco abuse, diabetes mellitus, type II, GERD, COPD, history of adenocarcinoma of the right lung. She was admitted to the hospital 06/19/14 with unstable angina symptoms. Initial troponins were negative. She underwent definitive cardiac catheterization and subsequent percutaneous coronary intervention with a DES to her OM2 on 06/20/14 By Dr Claiborne Billings.  She reports going down the step from her front porch, which is not very high, and feeling like her legs were no longer there.  She is scheduled for retrograde intramedullary fixation of her fracture tomorrow.  The patient currently denies nausea, vomiting, fever, chest pain, shortness of breath, orthopnea, dizziness, PND, cough, congestion, abdominal pain, hematochezia, melena, lower extremity edema.  Cath report from 06/20/14: Normal global LV function with an ejection fraction of 60%.  Coronary obstructive disease with 40% diffuse proximal narrowing in the first diagonal branch of the LAD; 95% stenosis in the O2 branch of the circumflex coronary artery; and 30% proximal RCA narrowing with previously placed tandem stents in the proximal and mid RCA with a smooth 30% narrowing within the stented segment 20% distally RCA stenosis.   Successful percutaneous coronary intervention to the left circumflex coronary artery with ultimate DES stenting with a Resolute integrity 2.5x14 mm stent, postdilated to 2.71 mm with the 95% ostial OM-2 stenosis being reduced to 0%.      Past Medical History  Diagnosis Date  . Asthma   . Cholelithiasis   . Colon polyps   . Coronary artery disease     The pt had stenting of the midsegment of the right coronary artery in 2005 with the Cypher stent. The last catheterization  demonstrated an EF of 60%. The right coronary artery stent was patent. The left  main was normal. Circumflex had luminal irregularities. The LAD had luminal irregularities.   . Diabetes mellitus     Type II  . GERD (gastroesophageal reflux disease)   . Hyperlipidemia   . Tobacco abuse   . Bronchitis   . Arthritis   . COPD (chronic obstructive pulmonary disease)     USES O2 AT NIGHT + PRN IN DAYTIME3L  . Emphysema   . H/O hiatal hernia   . Hypertension   . Fibromyalgia   . Lung cancer 2010    Adenocarcinoma, right lung, node positive    Past Surgical History  Procedure Laterality Date  . Appendectomy  1970  . Gallbladder surgery  1989  . Abdominal hysterectomy  1972  . Tonsillectomy  1966  . Carpal tunnel release    . Replacement total knee  2001    left  . Neck surgery  2005  . Pneumonectomy      Right Partial, lung cancer confirmed, nod positive  . Right upper lobectomy with lymph node dissection  10/12/2008    Dr Arlyce Dice  . Coronary angioplasty  2005  . Portacath placement  12/12/2007  . Port-a-cath removal  05/12/2012    Procedure: REMOVAL PORT-A-CATH;  Surgeon: Melrose Nakayama, MD;  Location: [facility];  Service: Thoracic;  Laterality: Left;  [name] Eye surgery      implants 02/18/12 (Left) 02/25/12 (right eye)  . Rotator cuff repair      RIGHT SHOULDER  2 YRS  . Orif humerus fracture Left 12/14/2012  Procedure: OPEN REDUCTION INTERNAL FIXATION (ORIF) LEFT PROXIMAL HUMERUS FRACTURE;  Surgeon: Mcarthur Rossetti, MD;  Location: [facility];  Service: Orthopedics;  Laterality: Left;  . Abdominal hysterectomy    . Joint replacement      Family History  Problem Relation Age of Onset  . Diabetes Mother   . Diabetes Father   . CAD Father 8  . Arthritis    . Lung disease    . Cancer    . Asthma      Social History:  reports that she quit smoking about 4 weeks ago. Her smoking use included Cigarettes. She has a 67.5 pack-year smoking history. She does not have any smokeless  tobacco history on file. She reports that she does not drink alcohol or use illicit drugs.  Allergies:  Allergies  Allergen Reactions  . Celebrex [Celecoxib] Palpitations and Other (See Comments)    Chest pain, tachycardia, diaphoresis  . Niacin Rash  . Varenicline Tartrate Rash    CHANTIX    Medications: Prior to Admission medications   Medication Sig Start Date End Date Taking? Authorizing Provider  albuterol (PROVENTIL) (2.5 MG/3ML) 0.083% nebulizer solution Take 2.5 mg by nebulization every 6 (six) hours as needed (bronchitis).   Yes Historical Provider, MD  aspirin EC 81 MG EC tablet Take 1 tablet (81 mg total) by mouth daily. 06/21/14  Yes Luke K Kilroy, PA-C  atorvastatin (LIPITOR) 40 MG tablet Take 1 tablet (40 mg total) by mouth daily at 6 PM. 06/21/14  Yes Erlene Quan, PA-C  calcium carbonate (TUMS - DOSED IN MG ELEMENTAL CALCIUM) 500 MG chewable tablet Chew 3 tablets by mouth daily as needed for indigestion or heartburn.   Yes Historical Provider, MD  Cholecalciferol (VITAMIN D) 1000 UNITS capsule Take 1,000 Units by mouth daily.    Yes Historical Provider, MD  clopidogrel (PLAVIX) 75 MG tablet Take 1 tablet (75 mg total) by mouth daily with breakfast. 06/21/14  Yes Erlene Quan, PA-C  esomeprazole (NEXIUM) 40 MG capsule Take 40 mg by mouth as needed (for heartburm/ acid reflux).    Yes Historical Provider, MD  HYDROcodone-acetaminophen (NORCO/VICODIN) 5-325 MG per tablet Take 1 tablet by mouth every 6 (six) hours as needed for moderate pain.   Yes Historical Provider, MD  ibuprofen (ADVIL,MOTRIN) 200 MG tablet Take 600 mg by mouth every 4 (four) hours as needed (pain).    Yes Historical Provider, MD  insulin aspart (NOVOLOG) 100 UNIT/ML injection Inject 10-25 Units into the skin 3 (three) times daily before meals. Units given depends on her CBG readings. < 200 gives 10 units, > 200 gives 20 units, 20-25 units at supper depending on CBG reading   Yes Historical Provider, MD    Insulin Detemir (LEVEMIR FLEXTOUCH) 100 UNIT/ML Pen Inject 45 Units into the skin at bedtime.   Yes Historical Provider, MD  Ipratropium-Albuterol (COMBIVENT RESPIMAT) 20-100 MCG/ACT AERS respimat Inhale 1 puff into the lungs 2 (two) times daily as needed for wheezing.   Yes Historical Provider, MD  lisinopril (PRINIVIL,ZESTRIL) 10 MG tablet Take 10 mg by mouth daily.    Yes Historical Provider, MD  metoprolol tartrate (LOPRESSOR) 25 MG tablet Take 12.5-25 mg by mouth 2 (two) times daily. 25 mg in the am & 12.5 mg in the pm   Yes Historical Provider, MD  nicotine (NICODERM CQ - DOSED IN MG/24 HOURS) 21 mg/24hr patch Place 21 mg onto the skin daily.   Yes Historical Provider, MD  nitroGLYCERIN (NITROSTAT) 0.4 MG  SL tablet Place 1 tablet (0.4 mg total) under the tongue every 5 (five) minutes x 3 doses as needed for chest pain. 06/21/14  Yes Luke K Kilroy, PA-C  Polyethyl Glycol-Propyl Glycol (SYSTANE) 0.4-0.3 % SOLN Place 1 drop into both eyes 2 (two) times daily.    Yes Historical Provider, MD  pregabalin (LYRICA) 50 MG capsule Take 50 mg by mouth 2 (two) times daily.   Yes Historical Provider, MD  ACCU-CHEK AVIVA PLUS test strip  05/29/14   Historical Provider, MD     Results for orders placed during the hospital encounter of 07/18/14 (from the past 48 hour(s))  CBC WITH DIFFERENTIAL     Status: Abnormal   Collection Time    07/18/14 10:24 AM      Result Value Ref Range   WBC 9.0  4.0 - 10.5 K/uL   RBC 5.31 (*) 3.87 - 5.11 MIL/uL   Hemoglobin 14.4  12.0 - 15.0 g/dL   HCT 44.0  36.0 - 46.0 %   MCV 82.9  78.0 - 100.0 fL   MCH 27.1  26.0 - 34.0 pg   MCHC 32.7  30.0 - 36.0 g/dL   RDW 14.6  11.5 - 15.5 %   Platelets 136 (*) 150 - 400 K/uL   Neutrophils Relative % 74  43 - 77 %   Neutro Abs 6.6  1.7 - 7.7 K/uL   Lymphocytes Relative 18  12 - 46 %   Lymphs Abs 1.6  0.7 - 4.0 K/uL   Monocytes Relative 7  3 - 12 %   Monocytes Absolute 0.6  0.1 - 1.0 K/uL   Eosinophils Relative 1  0 - 5 %    Eosinophils Absolute 0.1  0.0 - 0.7 K/uL   Basophils Relative 0  0 - 1 %   Basophils Absolute 0.0  0.0 - 0.1 K/uL  BASIC METABOLIC PANEL     Status: Abnormal   Collection Time    07/18/14 10:24 AM      Result Value Ref Range   Sodium 136 (*) 137 - 147 mEq/L   Potassium 5.9 (*) 3.7 - 5.3 mEq/L   Chloride 97  96 - 112 mEq/L   CO2 25  19 - 32 mEq/L   Glucose, Bld 317 (*) 70 - 99 mg/dL   BUN 19  6 - 23 mg/dL   Creatinine, Ser 0.80  0.50 - 1.10 mg/dL   Calcium 10.0  8.4 - 10.5 mg/dL   GFR calc non Af Amer 74 (*) >90 mL/min   GFR calc Af Amer 86 (*) >90 mL/min   Comment: (NOTE)     The eGFR has been calculated using the CKD EPI equation.     This calculation has not been validated in all clinical situations.     eGFR's persistently <90 mL/min signify possible Chronic Kidney     Disease.   Anion gap 14  5 - 15  PROTIME-INR     Status: None   Collection Time    07/18/14 10:24 AM      Result Value Ref Range   Prothrombin Time 12.2  11.6 - 15.2 seconds   INR 0.90  0.00 - 1.49  POTASSIUM     Status: None   Collection Time    07/18/14 12:34 PM      Result Value Ref Range   Potassium 4.7  3.7 - 5.3 mEq/L    Dg Chest 1 View  07/18/2014   CLINICAL DATA:  Left leg pain  post fall forward onto the ground this morning  EXAM: CHEST - 1 VIEW  COMPARISON:  06/16/2014  FINDINGS: Cardiomediastinal silhouette is stable. Again noted status post right upper lobectomy and postradiation changes in right upper medially. Chronic elevation of the right hemidiaphragm. No acute infiltrate or pulmonary edema. Metallic fixation plate noted cervical spine.  IMPRESSION: No active disease.  Stable postsurgical and postradiation changes.   Electronically Signed   By: Lahoma Crocker M.D.   On: 07/18/2014 10:20   Dg Hip Complete Left  07/18/2014   CLINICAL DATA:  Status post fall this morning  EXAM: LEFT HIP - COMPLETE 2+ VIEW  COMPARISON:  None.  FINDINGS: There is no evidence of hip fracture or dislocation.  There is  mild osteoarthritis of the right hip.  There is posterior spinal fusion at L4-5 with interbody spacer.  There is peripheral vascular atherosclerotic disease.  IMPRESSION: No acute osseous injury of the left hip.   Electronically Signed   By: Kathreen Devoid   On: 07/18/2014 10:20   Dg Femur Left  07/18/2014   CLINICAL DATA:  Fall.  Pain in upper leg.  Initial evaluation.  EXAM: LEFT FEMUR - 2 VIEW  COMPARISON:  None.  FINDINGS: Vascular calcification consistent with peripheral vascular disease noted. Angulated fracture of the distal femur with comminution is noted. The fracture is just above and may involve the femoral component of the patient's total left knee replacement. Degenerative changes left hip.  IMPRESSION: Angulated comminuted fracture of the distal left femoral metaphysis. The fracture is just above and may involve the femoral component of total knee replacement P   Electronically Signed   By: Marcello Moores  Register   On: 07/18/2014 10:20   Dg Knee Complete 4 Views Left  07/18/2014   CLINICAL DATA:  Fall forward onto the ground this morning, left leg pain  EXAM: LEFT KNEE - COMPLETE 4+ VIEW  COMPARISON:  None.  FINDINGS: Three views of the left knee submitted. There is displaced mild comminuted fracture in distal femoral metaphysis. Left knee prosthesis. The prosthesis is still located.  IMPRESSION: Displaced comminuted fracture in distal femur just above the knee prosthesis.   Electronically Signed   By: Lahoma Crocker M.D.   On: 07/18/2014 10:22    Review of Systems  Constitutional: Negative for fever.  HENT: Negative for congestion and sore throat.   Respiratory: Negative for shortness of breath.   Cardiovascular: Negative for chest pain, orthopnea, leg swelling and PND.  Gastrointestinal: Negative for nausea, vomiting, abdominal pain, diarrhea, blood in stool and melena.       She reports foul smelling BM.   Genitourinary: Negative for hematuria.  Musculoskeletal: Positive for joint pain and  myalgias (Lft knee/leg).  Neurological: Positive for weakness. Negative for dizziness.  All other systems reviewed and are negative.  Blood pressure 121/60, pulse 100, temperature 98.8 F (37.1 C), temperature source Oral, resp. rate 16, height $RemoveBe'5\' 2"'[id number]$  (1.575 m), weight 211 lb (95.709 kg), SpO2 95.00%. Physical Exam  Nursing note and vitals reviewed. Constitutional: She is oriented to person, place, and time. She appears well-developed. No distress.  Obese  HENT:  Head: Normocephalic and atraumatic.  Mouth/Throat: No oropharyngeal exudate.  Eyes: EOM are normal. Pupils are equal, round, and reactive to light. No scleral icterus.  Neck: Normal range of motion. Neck supple.  Cardiovascular: Regular rhythm, S1 normal and S2 normal.  Tachycardia present.   Murmur heard.  Systolic murmur is present with a grade of 1/6  Pulses:  Radial pulses are 2+ on the right side, and 2+ on the left side.       Dorsalis pedis pulses are 2+ on the right side, and 2+ on the left side.  Soft sys MM  Respiratory: Effort normal and breath sounds normal. She has no wheezes. She has no rales.  GI: Soft. Bowel sounds are normal. She exhibits no distension. There is tenderness (mildly sore).  Musculoskeletal:  No LEE   Lymphadenopathy:    She has no cervical adenopathy.  Neurological: She is alert and oriented to person, place, and time. She exhibits normal muscle tone.  Skin: Skin is warm and dry.  Psychiatric: She has a normal mood and affect.    Assessment/Plan: Principal Problem:   Fracture, femur, distal Active Problems:   TOBACCO ABUSE   Essential hypertension   COPD (chronic obstructive pulmonary disease) with chronic bronchitis   CAD- OM2 DES 06/20/14   GERD (gastroesophageal reflux disease)   Hyperlipidemia  Plan:   68 year old female who is status post prior stenting to RCA in 2005. She has a history of tobacco abuse, diabetes mellitus, type II, GERD, COPD, adenocarcinoma of the right  lung. She was admitted to the hospital 06/19/14 with unstable angina symptoms.and underwent definitive cardiac catheterization and subsequent percutaneous coronary intervention with a DES to her OM2 on 06/20/14 By Dr Claiborne Billings.  She is on ASA and Plavix.  She fell after stepping off her front steps and fractured her distal femur.  She is scheduled for retrograde intramedullary fixation of her fracture tomorrow.  Although she had a new stent place recently, she has nonobstructive disease in the other vessels.  EF is 60%.  We need to continue asa and plavix which is ok with Dr. Lynann Bologna.  I think she will be low risk for a cardiac complication.  MD opinion to follow.    [name], [place] 07/18/2014, 2:05 PM    Patient seen and examined. Agree with assessment and plan.  [name] is a 68 year old female , who has established coronary artery disease, history of type 2 diabetes mellitus, GERD, COPD, remote adenocarcinoma of the right lung, and tobacco abuse.  She underwent initial stenting to RCA in 2005.  She was admitted one month ago with unstable angina symptoms.  Cardiac catheterization performed by me revealed a new 95% circumflex stenosis, and she underwent successful percutaneous coronary intervention with open insertion of a 2.5x14 mm resolute integrity DES stent post dilated up to 2.71 mm.  She had mild concomitant CAD with 40% narrowing in the first diagonal branch of her LAD, 20, and 30% RCA stenoses.  She normal LV function with an ejection fraction of 60%.  Subsequently, she has noticed resolution of her prior cardiac symptoms.  She has had some episodes of mild bradycardia, which led to a dose reduction of beta blocker therapy.  She is admitted today after sustaining a distal left femur fracture and is in need for surgery tomorrow for retrograde intramedullary fixation of her fracture.  She has been on aspirin and Plavix for dual antiplatelet therapy following her DES stent.  She's not had any anginal  symptomatology.  She has no signs of CHF presently.  Her ECG is unremarkable.  It does show mild sinus arrhythmia without significant ST-T changes.  She will be given clearance for her planned orthopedic surgery.  Plavix and aspirin can be held tomorrow morning prior to surgery, but I would not hold this for more than 2 days without potential institution of  anticoagulation or glycoprotein 2 B3 inhibitor treatment since it has only been one month from her DES stent implantation into her left circumflex coronary artery.  The patient should undergo postoperative ECG and be monitored on telemetry.  Presently, she is tachycardic, most likely contributed by her pain and I will increase her metoprolol tartrate to 25 mg twice a day.   Rita Sine, MD, [facility] 07/18/2014 2:50 PM

## 2014-07-18 NOTE — Progress Notes (Signed)
Orthopedic Tech Progress Note Patient Details:  [name] [facility] 07-05-1946 [id number] Applied Velcro knee immobilizer to LLE.  Pulses, sensation, motion intact before and after application.  Capillary refill less than 2 seconds before and after application. Ortho Devices Type of Ortho Device: Knee Immobilizer Ortho Device/Splint Location: LLE Ortho Device/Splint Interventions: Application   Ronnald Collum L 07/18/2014, 1:20 PM

## 2014-07-18 NOTE — H&P (Signed)
History and Physical  [name] KZS:[id number] DOB: 09-02-1946 DOA: 07/18/2014  Referring physician: Dr. Jola Schmidt, EDP PCP: [name], MD  Outpatient Specialists:  1. Cardiology: [name]. 2. Oncology: Dr. Curt Bears  Chief Complaint: Fall and left knee pain.  HPI: [name] is a 69 y.o. female with history of asthma/COPD on nightly oxygen 2-4 L per minute, CAD-PCI with DES to her OM 2 on 06/20/14 and prior stenting of RCA in 2005, recently quit smoking, type II DM/IDDM, GERD, lung cancer said to be in remission, morbid obesity, hyperlipidemia, presented to the ED following a fall at home and left knee pain. This morning, she was trying to step onto the porch when she became unsteady and fell on her knees followed immediately by excruciating left knee pain. She denied any other injuries, head injury, chest pain, palpitations, dizziness or lightheadedness. She presented to the ED where imaging confirms left periprosthetic distal femur fracture. Orthopedics has consulted and plan retrograde intramedullary fixation of her fracture for 07/19/14 and she has currently been placed on a knee immobilizer. Hospitalist admission requested.  Review of Systems: All systems reviewed and apart from history of presenting illness, are negative.  Past Medical History  Diagnosis Date  . Asthma   . Cholelithiasis   . Colon polyps   . Coronary artery disease     The pt had stenting of the midsegment of the right coronary artery in 2005 with the Cypher stent. The last catheterization demonstrated an EF of 60%. The right coronary artery stent was patent. The left  main was normal. Circumflex had luminal irregularities. The LAD had luminal irregularities.   . Diabetes mellitus     Type II  . GERD (gastroesophageal reflux disease)   . Hyperlipidemia   . Tobacco abuse   . Bronchitis   . Arthritis   . COPD (chronic obstructive pulmonary disease)     USES O2 AT NIGHT + PRN IN  DAYTIME3L  . Emphysema   . H/O hiatal hernia   . Hypertension   . Fibromyalgia   . Lung cancer 2010    Adenocarcinoma, right lung, node positive   Past Surgical History  Procedure Laterality Date  . Appendectomy  1970  . Gallbladder surgery  1989  . Abdominal hysterectomy  1972  . Tonsillectomy  1966  . Carpal tunnel release    . Replacement total knee  2001    left  . Neck surgery  2005  . Pneumonectomy      Right Partial, lung cancer confirmed, nod positive  . Right upper lobectomy with lymph node dissection  10/12/2008    Dr Arlyce Dice  . Coronary angioplasty  2005  . Portacath placement  12/12/2007  . Port-a-cath removal  05/12/2012    Procedure: REMOVAL PORT-A-CATH;  Surgeon: Melrose Nakayama, MD;  Location: [place];  Service: Thoracic;  Laterality: Left;  [name] Eye surgery      implants 02/18/12 (Left) 02/25/12 (right eye)  . Rotator cuff repair      RIGHT SHOULDER  2 YRS  . Orif humerus fracture Left 12/14/2012    Procedure: OPEN REDUCTION INTERNAL FIXATION (ORIF) LEFT PROXIMAL HUMERUS FRACTURE;  Surgeon: Mcarthur Rossetti, MD;  Location: [place];  Service: Orthopedics;  Laterality: Left;  . Abdominal hysterectomy    . Joint replacement     Social History:  reports that she quit smoking about 4 weeks ago. Her smoking use included Cigarettes. She has a 67.5 pack-year smoking  history. She does not have any smokeless tobacco history on file. She reports that she does not drink alcohol or use illicit drugs. Widowed. Lives with mother and a granddaughter. Independent of activities of daily living.  Allergies  Allergen Reactions  . Celebrex [Celecoxib] Palpitations and Other (See Comments)    Chest pain, tachycardia, diaphoresis  . Niacin Rash  . Varenicline Tartrate Rash    CHANTIX    Family History  Problem Relation Age of Onset  . Diabetes Mother   . Diabetes Father   . CAD Father 30  . Arthritis    . Lung disease    . Cancer    . Asthma      Prior to Admission  medications   Medication Sig Start Date End Date Taking? Authorizing Provider  albuterol (PROVENTIL) (2.5 MG/3ML) 0.083% nebulizer solution Take 2.5 mg by nebulization every 6 (six) hours as needed (bronchitis).   Yes Historical Provider, MD  aspirin EC 81 MG EC tablet Take 1 tablet (81 mg total) by mouth daily. 06/21/14  Yes Luke K Kilroy, PA-C  atorvastatin (LIPITOR) 40 MG tablet Take 1 tablet (40 mg total) by mouth daily at 6 PM. 06/21/14  Yes Erlene Quan, PA-C  calcium carbonate (TUMS - DOSED IN MG ELEMENTAL CALCIUM) 500 MG chewable tablet Chew 3 tablets by mouth daily as needed for indigestion or heartburn.   Yes Historical Provider, MD  Cholecalciferol (VITAMIN D) 1000 UNITS capsule Take 1,000 Units by mouth daily.    Yes Historical Provider, MD  clopidogrel (PLAVIX) 75 MG tablet Take 1 tablet (75 mg total) by mouth daily with breakfast. 06/21/14  Yes Erlene Quan, PA-C  esomeprazole (NEXIUM) 40 MG capsule Take 40 mg by mouth as needed (for heartburm/ acid reflux).    Yes Historical Provider, MD  HYDROcodone-acetaminophen (NORCO/VICODIN) 5-325 MG per tablet Take 1 tablet by mouth every 6 (six) hours as needed for moderate pain.   Yes Historical Provider, MD  ibuprofen (ADVIL,MOTRIN) 200 MG tablet Take 600 mg by mouth every 4 (four) hours as needed (pain).    Yes Historical Provider, MD  insulin aspart (NOVOLOG) 100 UNIT/ML injection Inject 10-25 Units into the skin 3 (three) times daily before meals. Units given depends on her CBG readings. < 200 gives 10 units, > 200 gives 20 units, 20-25 units at supper depending on CBG reading   Yes Historical Provider, MD  Insulin Detemir (LEVEMIR FLEXTOUCH) 100 UNIT/ML Pen Inject 45 Units into the skin at bedtime.   Yes Historical Provider, MD  Ipratropium-Albuterol (COMBIVENT RESPIMAT) 20-100 MCG/ACT AERS respimat Inhale 1 puff into the lungs 2 (two) times daily as needed for wheezing.   Yes Historical Provider, MD  lisinopril (PRINIVIL,ZESTRIL) 10 MG  tablet Take 10 mg by mouth daily.    Yes Historical Provider, MD  metoprolol tartrate (LOPRESSOR) 25 MG tablet Take 12.5-25 mg by mouth 2 (two) times daily. 25 mg in the am & 12.5 mg in the pm   Yes Historical Provider, MD  nicotine (NICODERM CQ - DOSED IN MG/24 HOURS) 21 mg/24hr patch Place 21 mg onto the skin daily.   Yes Historical Provider, MD  nitroGLYCERIN (NITROSTAT) 0.4 MG SL tablet Place 1 tablet (0.4 mg total) under the tongue every 5 (five) minutes x 3 doses as needed for chest pain. 06/21/14  Yes Luke K Kilroy, PA-C  Polyethyl Glycol-Propyl Glycol (SYSTANE) 0.4-0.3 % SOLN Place 1 drop into both eyes 2 (two) times daily.    Yes Historical Provider, MD  pregabalin (LYRICA) 50 MG capsule Take 50 mg by mouth 2 (two) times daily.   Yes Historical Provider, MD  ACCU-CHEK AVIVA PLUS test strip  05/29/14   Historical Provider, MD   Physical Exam: Filed Vitals:   07/18/14 1215 07/18/14 1232 07/18/14 1300 07/18/14 1314  BP: 111/56 113/61 134/64 134/64  Pulse: 94 92 96 104  Temp:      TempSrc:      Resp:   16 18  Height:      Weight:      SpO2: 99% 99% 100% 100%     General exam: Moderately built and morbidly obese female patient, lying comfortably supine on the gurney in no obvious distress.  Head, eyes and ENT: Nontraumatic and normocephalic. Pupils equally reacting to light and accommodation. Oral mucosa moist.  Neck: Supple. No JVD, carotid bruit or thyromegaly.  Lymphatics: No lymphadenopathy.  Respiratory system: Clear to auscultation. No increased work of breathing.  Cardiovascular system: S1 and S2 heard, RRR. No JVD, murmurs, gallops, clicks or pedal edema.  Gastrointestinal system: Abdomen is nondistended, soft and nontender. Normal bowel sounds heard. No organomegaly or masses appreciated. Multiple laparotomy scars present.  Central nervous system: Alert and oriented. No focal neurological deficits.  Extremities: Symmetric 5 x 5 power. Peripheral pulses symmetrically  felt. Left knee in immobilizer. Able to physical toes. Limited movements of left lower extremity secondary to pain.   Skin: No rashes or acute findings.  Musculoskeletal system: Negative exam.  Psychiatry: Pleasant and cooperative.   Labs on Admission:  Basic Metabolic Panel:  Recent Labs Lab 07/18/14 1024  NA 136*  K 5.9*  CL 97  CO2 25  GLUCOSE 317*  BUN 19  CREATININE 0.80  CALCIUM 10.0   Liver Function Tests: No results found for this basename: AST, ALT, ALKPHOS, BILITOT, PROT, ALBUMIN,  in the last 168 hours No results found for this basename: LIPASE, AMYLASE,  in the last 168 hours No results found for this basename: AMMONIA,  in the last 168 hours CBC:  Recent Labs Lab 07/18/14 1024  WBC 9.0  NEUTROABS 6.6  HGB 14.4  HCT 44.0  MCV 82.9  PLT 136*   Cardiac Enzymes: No results found for this basename: CKTOTAL, CKMB, CKMBINDEX, TROPONINI,  in the last 168 hours  BNP (last 3 results)  Recent Labs  06/16/14 1603  PROBNP 142.5*   CBG: No results found for this basename: GLUCAP,  in the last 168 hours  Radiological Exams on Admission: Dg Chest 1 View  07/18/2014   CLINICAL DATA:  Left leg pain post fall forward onto the ground this morning  EXAM: CHEST - 1 VIEW  COMPARISON:  06/16/2014  FINDINGS: Cardiomediastinal silhouette is stable. Again noted status post right upper lobectomy and postradiation changes in right upper medially. Chronic elevation of the right hemidiaphragm. No acute infiltrate or pulmonary edema. Metallic fixation plate noted cervical spine.  IMPRESSION: No active disease.  Stable postsurgical and postradiation changes.   Electronically Signed   By: Lahoma Crocker M.D.   On: 07/18/2014 10:20   Dg Hip Complete Left  07/18/2014   CLINICAL DATA:  Status post fall this morning  EXAM: LEFT HIP - COMPLETE 2+ VIEW  COMPARISON:  None.  FINDINGS: There is no evidence of hip fracture or dislocation.  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   There is posterior spinal fusion at L4-5 with interbody spacer.  There is peripheral vascular atherosclerotic disease.  IMPRESSION: No acute osseous injury of  the left hip.   Electronically Signed   By: Kathreen Devoid   On: 07/18/2014 10:20   Dg Femur Left  07/18/2014   CLINICAL DATA:  Fall.  Pain in upper leg.  Initial evaluation.  EXAM: LEFT FEMUR - 2 VIEW  COMPARISON:  None.  FINDINGS: Vascular calcification consistent with peripheral vascular disease noted. Angulated fracture of the distal femur with comminution is noted. The fracture is just above and may involve the femoral component of the patient's total left knee replacement. Degenerative changes left hip.  IMPRESSION: Angulated comminuted fracture of the distal left femoral metaphysis. The fracture is just above and may involve the femoral component of total knee replacement P   Electronically Signed   By: Marcello Moores  Register   On: 07/18/2014 10:20   Dg Knee Complete 4 Views Left  07/18/2014   CLINICAL DATA:  Fall forward onto the ground this morning, left leg pain  EXAM: LEFT KNEE - COMPLETE 4+ VIEW  COMPARISON:  None.  FINDINGS: Three views of the left knee submitted. There is displaced mild comminuted fracture in distal femoral metaphysis. Left knee prosthesis. The prosthesis is still located.  IMPRESSION: Displaced comminuted fracture in distal femur just above the knee prosthesis.   Electronically Signed   By: Lahoma Crocker M.D.   On: 07/18/2014 10:22    EKG: Independently reviewed. Sinus rhythm, LAD without acute changes.  Assessment/Plan Principal Problem:   Fracture, femur, distal Active Problems:   TOBACCO ABUSE   Essential hypertension   COPD (chronic obstructive pulmonary disease) with chronic bronchitis   CAD- OM2 DES 06/20/14   GERD (gastroesophageal reflux disease)   Hyperlipidemia   1. Left distal femoral fracture: orthopedics have consulted and plan for retrograde intramedullary fixation of her fracture on 07/19/14. N.p.o.  after midnight tonight. Discussed with Dr. Marlene Bast to continue Plavix. Patient has extensive cardiac history including recent cardiac cath and PCI and hence will request cardiology for preop clearance. 2. Uncontrolled type II DM/IDDM: Continue Lantus at slightly reduced dose. Patient on high dose NovoLog sliding scale at home. Will place on moderate NovoLog SSI and mealtime NovoLog 6 units 3 times a day. Monitor closely. 3. Essential Hypertension: Controlled. Continue lisinopril and metoprolol. 4. COPD/chronic respiratory failure on nightly oxygen: Stable. Continue oxygen. 5. GERD: Continue PPI 6. CAD, status post recent PCI with DES to OM 2 and prior stenting of the RCA: Denies chest pain but complains of occasional chest pressure which she attributes to her COPD. Continue aspirin, Plavix, statins, beta blockers. Cardiology consulted. 7. Lung cancer: Said to be in remission. Outpatient followup     Code Status: Full  Family Communication: Discussed with mother and granddaughter at bedside.  Disposition Plan: To be determined post op -?SNF   Time spent: 9 minutes  Namya Voges, MD, FACP, FHM. [facility] Pager [phone]  If 7PM-7AM, please contact night-coverage [email or web address] Password [email or web address] 07/18/2014, 1:21 PM

## 2014-07-18 NOTE — ED Notes (Addendum)
Pt from home via [facility] with c/o left knee pain after it "gave out" causeing pt to "twist around and sit down on the ground."  Pt reports being unable to bend her knee.  Hx of total knee replacement of same knee.  Pt in NAD, A&O.

## 2014-07-18 NOTE — Consult Note (Signed)
Reason for Consult:Left knee pain Referring Physician: Dr. [name] is an 68 y.o. female who presented to the emergency department today, status post a twisting injury where she fell and was unable to bear weight on the left leg. The patient was then transported to the [facility] Emergency department. The patient was diagnosed with a distal femur fracture on the left, above her previous total knee replacement. Presently, the patient rates her pain as severe.  She describes pain as a sharp stabbing sensation located about the left knee. Also c/o LBP.   Past Medical History  Diagnosis Date  . Asthma   . Cholelithiasis   . Colon polyps   . Coronary artery disease     The pt had stenting of the midsegment of the right coronary artery in 2005 with the Cypher stent. The last catheterization demonstrated an EF of 60%. The right coronary artery stent was patent. The left  main was normal. Circumflex had luminal irregularities. The LAD had luminal irregularities.   . Diabetes mellitus     Type II  . GERD (gastroesophageal reflux disease)   . Hyperlipidemia   . Tobacco abuse   . Bronchitis   . Arthritis   . COPD (chronic obstructive pulmonary disease)     USES O2 AT NIGHT + PRN IN DAYTIME3L  . Emphysema   . H/O hiatal hernia   . Hypertension   . Fibromyalgia   . Lung cancer 2010    Adenocarcinoma, right lung, node positive    Past Surgical History  Procedure Laterality Date  . Appendectomy  1970  . Gallbladder surgery  1989  . Abdominal hysterectomy  1972  . Tonsillectomy  1966  . Carpal tunnel release    . Replacement total knee  2001    left  . Neck surgery  2005  . Pneumonectomy      Right Partial, lung cancer confirmed, nod positive  . Right upper lobectomy with lymph node dissection  10/12/2008    Dr Arlyce Dice  . Coronary angioplasty  2005  . Portacath placement  12/12/2007  . Port-a-cath removal  05/12/2012    Procedure: REMOVAL PORT-A-CATH;  Surgeon: Melrose Nakayama, MD;  Location: [place];  Service: Thoracic;  Laterality: Left;  [name] Eye surgery      implants 02/18/12 (Left) 02/25/12 (right eye)  . Rotator cuff repair      RIGHT SHOULDER  2 YRS  . Orif humerus fracture Left 12/14/2012    Procedure: OPEN REDUCTION INTERNAL FIXATION (ORIF) LEFT PROXIMAL HUMERUS FRACTURE;  Surgeon: Mcarthur Rossetti, MD;  Location: [place];  Service: Orthopedics;  Laterality: Left;  . Abdominal hysterectomy    . Joint replacement      Family History  Problem Relation Age of Onset  . Diabetes Mother   . Diabetes Father   . CAD Father 84  . Arthritis    . Lung disease    . Cancer    . Asthma      Social History:  reports that she quit smoking about 4 weeks ago. Her smoking use included Cigarettes. She has a 67.5 pack-year smoking history. She does not have any smokeless tobacco history on file. She reports that she does not drink alcohol or use illicit drugs.  Allergies:  Allergies  Allergen Reactions  . Celebrex [Celecoxib] Palpitations and Other (See Comments)    Chest pain, tachycardia, diaphoresis  . Niacin Rash  . Varenicline Tartrate Rash    CHANTIX  Medications: I have reviewed the patient's current medications.  Results for orders placed during the hospital encounter of 07/18/14 (from the past 48 hour(s))  CBC WITH DIFFERENTIAL     Status: Abnormal   Collection Time    07/18/14 10:24 AM      Result Value Ref Range   WBC 9.0  4.0 - 10.5 K/uL   RBC 5.31 (*) 3.87 - 5.11 MIL/uL   Hemoglobin 14.4  12.0 - 15.0 g/dL   HCT 44.0  36.0 - 46.0 %   MCV 82.9  78.0 - 100.0 fL   MCH 27.1  26.0 - 34.0 pg   MCHC 32.7  30.0 - 36.0 g/dL   RDW 14.6  11.5 - 15.5 %   Platelets 136 (*) 150 - 400 K/uL   Neutrophils Relative % 74  43 - 77 %   Neutro Abs 6.6  1.7 - 7.7 K/uL   Lymphocytes Relative 18  12 - 46 %   Lymphs Abs 1.6  0.7 - 4.0 K/uL   Monocytes Relative 7  3 - 12 %   Monocytes Absolute 0.6  0.1 - 1.0 K/uL   Eosinophils Relative 1  0 - 5 %    Eosinophils Absolute 0.1  0.0 - 0.7 K/uL   Basophils Relative 0  0 - 1 %   Basophils Absolute 0.0  0.0 - 0.1 K/uL  BASIC METABOLIC PANEL     Status: Abnormal   Collection Time    07/18/14 10:24 AM      Result Value Ref Range   Sodium 136 (*) 137 - 147 mEq/L   Potassium 5.9 (*) 3.7 - 5.3 mEq/L   Chloride 97  96 - 112 mEq/L   CO2 25  19 - 32 mEq/L   Glucose, Bld 317 (*) 70 - 99 mg/dL   BUN 19  6 - 23 mg/dL   Creatinine, Ser 0.80  0.50 - 1.10 mg/dL   Calcium 10.0  8.4 - 10.5 mg/dL   GFR calc non Af Amer 74 (*) >90 mL/min   GFR calc Af Amer 86 (*) >90 mL/min   Comment: (NOTE)     The eGFR has been calculated using the CKD EPI equation.     This calculation has not been validated in all clinical situations.     eGFR's persistently <90 mL/min signify possible Chronic Kidney     Disease.   Anion gap 14  5 - 15  PROTIME-INR     Status: None   Collection Time    07/18/14 10:24 AM      Result Value Ref Range   Prothrombin Time 12.2  11.6 - 15.2 seconds   INR 0.90  0.00 - 1.49    Dg Chest 1 View  07/18/2014   CLINICAL DATA:  Left leg pain post fall forward onto the ground this morning  EXAM: CHEST - 1 VIEW  COMPARISON:  06/16/2014  FINDINGS: Cardiomediastinal silhouette is stable. Again noted status post right upper lobectomy and postradiation changes in right upper medially. Chronic elevation of the right hemidiaphragm. No acute infiltrate or pulmonary edema. Metallic fixation plate noted cervical spine.  IMPRESSION: No active disease.  Stable postsurgical and postradiation changes.   Electronically Signed   By: Lahoma Crocker M.D.   On: 07/18/2014 10:20   Dg Hip Complete Left  07/18/2014   CLINICAL DATA:  Status post fall this morning  EXAM: LEFT HIP - COMPLETE 2+ VIEW  COMPARISON:  None.  FINDINGS: There is no evidence of  hip fracture or dislocation.  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  There is posterior spinal fusion at L4-5 with interbody spacer.  There is peripheral vascular  atherosclerotic disease.  IMPRESSION: No acute osseous injury of the left hip.   Electronically Signed   By: Kathreen Devoid   On: 07/18/2014 10:20   Dg Femur Left  07/18/2014   CLINICAL DATA:  Fall.  Pain in upper leg.  Initial evaluation.  EXAM: LEFT FEMUR - 2 VIEW  COMPARISON:  None.  FINDINGS: Vascular calcification consistent with peripheral vascular disease noted. Angulated fracture of the distal femur with comminution is noted. The fracture is just above and may involve the femoral component of the patient's total left knee replacement. Degenerative changes left hip.  IMPRESSION: Angulated comminuted fracture of the distal left femoral metaphysis. The fracture is just above and may involve the femoral component of total knee replacement P   Electronically Signed   By: Marcello Moores  Register   On: 07/18/2014 10:20   Dg Knee Complete 4 Views Left  07/18/2014   CLINICAL DATA:  Fall forward onto the ground this morning, left leg pain  EXAM: LEFT KNEE - COMPLETE 4+ VIEW  COMPARISON:  None.  FINDINGS: Three views of the left knee submitted. There is displaced mild comminuted fracture in distal femoral metaphysis. Left knee prosthesis. The prosthesis is still located.  IMPRESSION: Displaced comminuted fracture in distal femur just above the knee prosthesis.   Electronically Signed   By: Lahoma Crocker M.D.   On: 07/18/2014 10:22    Review of Systems  Constitutional: Negative.   Eyes: Negative.   Cardiovascular: Negative.   Gastrointestinal: Negative.   Endo/Heme/Allergies: Negative.    Blood pressure 131/80, pulse 110, temperature 98.8 F (37.1 C), temperature source Oral, resp. rate 16, height _0  (1.575 m), weight 95.709 kg (211 lb), SpO2 99.00%. Physical Exam  Constitutional: She appears well-developed and well-nourished.  HENT:  Head: Normocephalic and atraumatic.  Neck: Normal range of motion.  Respiratory: Effort normal.  GI: Soft.  Musculoskeletal:  Pain is noted with minimal ROM of left  knee. Cap refill < 2 sec at left great toe. + diffuse swelling noted in region of left knee. Skin intact. + DF/PF left foot.    Assessment/Plan: Comminuted left distal femur fracture above TKA  Case was discussed with Dr. Venora Maples, and Dr. Berenice Primas.  The present plan is to admit the patient medicine, given her extensive medical history.  She'll be made n.p.o. After midnight tonight.  Dr. Berenice Primas is planning on performing a retrograde intramedullary fixation of her fracture.  This was discussed in some detail with the patient and her family.  Additional details of the surgery will be discussed with the patient by Dr. Berenice Primas tomorrow.  She'll be maintained in a knee immobilizer until surgery.  [name] 07/18/2014, 11:57 AM

## 2014-07-18 NOTE — ED Notes (Signed)
Pt given ice chips

## 2014-07-18 NOTE — ED Provider Notes (Addendum)
CSN: [id number]     Arrival date & time 07/18/14  0737 History   First MD Initiated Contact with Patient 07/18/14 [phone]     Chief Complaint  Patient presents with  . Knee Pain      HPI Patient reports severe pain in her left knee.  She states that her leg gave out on her causing her to fall and twist.  She reports severe pain in that left knee.  She has a history of total left knee.  She states she was standing on the front porch when this occurred.  She denies head injury.  No neck pain.  No weakness of arms or legs.  She can wiggle the toes on her left foot.  She can feel her left foot.  She also does report some pain up in the left hip and in her left groin.  Her pain is severe in severity at this time.  Her pain is worsened by palpation and movement of her left knee.   Past Medical History  Diagnosis Date  . Asthma   . Cholelithiasis   . Colon polyps   . Coronary artery disease     The pt had stenting of the midsegment of the right coronary artery in 2005 with the Cypher stent. The last catheterization demonstrated an EF of 60%. The right coronary artery stent was patent. The left  main was normal. Circumflex had luminal irregularities. The LAD had luminal irregularities.   . Diabetes mellitus     Type II  . GERD (gastroesophageal reflux disease)   . Hyperlipidemia   . Tobacco abuse   . Bronchitis   . Arthritis   . COPD (chronic obstructive pulmonary disease)     USES O2 AT NIGHT + PRN IN DAYTIME3L  . Emphysema   . H/O hiatal hernia   . Hypertension   . Fibromyalgia   . Lung cancer 2010    Adenocarcinoma, right lung, node positive   Past Surgical History  Procedure Laterality Date  . Appendectomy  1970  . Gallbladder surgery  1989  . Abdominal hysterectomy  1972  . Tonsillectomy  1966  . Carpal tunnel release    . Replacement total knee  2001    left  . Neck surgery  2005  . Pneumonectomy      Right Partial, lung cancer confirmed, nod positive  . Right upper  lobectomy with lymph node dissection  10/12/2008    Dr Arlyce Dice  . Coronary angioplasty  2005  . Portacath placement  12/12/2007  . Port-a-cath removal  05/12/2012    Procedure: REMOVAL PORT-A-CATH;  Surgeon: Melrose Nakayama, MD;  Location: [facility];  Service: Thoracic;  Laterality: Left;  [name] Eye surgery      implants 02/18/12 (Left) 02/25/12 (right eye)  . Rotator cuff repair      RIGHT SHOULDER  2 YRS  . Orif humerus fracture Left 12/14/2012    Procedure: OPEN REDUCTION INTERNAL FIXATION (ORIF) LEFT PROXIMAL HUMERUS FRACTURE;  Surgeon: Mcarthur Rossetti, MD;  Location: [facility];  Service: Orthopedics;  Laterality: Left;  . Abdominal hysterectomy    . Joint replacement     Family History  Problem Relation Age of Onset  . Diabetes Mother   . Diabetes Father   . CAD Father 75  . Arthritis    . Lung disease    . Cancer    . Asthma     History  Substance Use Topics  . Smoking status: Former Smoker --  1.50 packs/day for 45 years    Types: Cigarettes    Quit date: 06/19/2014  . Smokeless tobacco: Not on file     Comment: Put the patch on today.    . Alcohol Use: No   OB History   Grav Para Term Preterm Abortions TAB SAB Ect Mult Living                 Review of Systems  All other systems reviewed and are negative.     Allergies  Celebrex; Niacin; and Varenicline tartrate  Home Medications   Prior to Admission medications   Medication Sig Start Date End Date Taking? Authorizing Provider  albuterol (PROVENTIL) (2.5 MG/3ML) 0.083% nebulizer solution Take 2.5 mg by nebulization every 6 (six) hours as needed (bronchitis).   Yes Historical Provider, MD  aspirin EC 81 MG EC tablet Take 1 tablet (81 mg total) by mouth daily. 06/21/14  Yes Luke K Kilroy, PA-C  atorvastatin (LIPITOR) 40 MG tablet Take 1 tablet (40 mg total) by mouth daily at 6 PM. 06/21/14  Yes Erlene Quan, PA-C  calcium carbonate (TUMS - DOSED IN MG ELEMENTAL CALCIUM) 500 MG chewable tablet Chew 3 tablets by mouth  daily as needed for indigestion or heartburn.   Yes Historical Provider, MD  Cholecalciferol (VITAMIN D) 1000 UNITS capsule Take 1,000 Units by mouth daily.    Yes Historical Provider, MD  clopidogrel (PLAVIX) 75 MG tablet Take 1 tablet (75 mg total) by mouth daily with breakfast. 06/21/14  Yes Erlene Quan, PA-C  esomeprazole (NEXIUM) 40 MG capsule Take 40 mg by mouth as needed (for heartburm/ acid reflux).    Yes Historical Provider, MD  HYDROcodone-acetaminophen (NORCO/VICODIN) 5-325 MG per tablet Take 1 tablet by mouth every 6 (six) hours as needed for moderate pain.   Yes Historical Provider, MD  ibuprofen (ADVIL,MOTRIN) 200 MG tablet Take 600 mg by mouth every 4 (four) hours as needed (pain).    Yes Historical Provider, MD  insulin aspart (NOVOLOG) 100 UNIT/ML injection Inject 10-25 Units into the skin 3 (three) times daily before meals. Units given depends on her CBG readings. < 200 gives 10 units, > 200 gives 20 units, 20-25 units at supper depending on CBG reading   Yes Historical Provider, MD  Insulin Detemir (LEVEMIR FLEXTOUCH) 100 UNIT/ML Pen Inject 45 Units into the skin at bedtime.   Yes Historical Provider, MD  Ipratropium-Albuterol (COMBIVENT RESPIMAT) 20-100 MCG/ACT AERS respimat Inhale 1 puff into the lungs 2 (two) times daily as needed for wheezing.   Yes Historical Provider, MD  lisinopril (PRINIVIL,ZESTRIL) 10 MG tablet Take 10 mg by mouth daily.    Yes Historical Provider, MD  metoprolol tartrate (LOPRESSOR) 25 MG tablet Take 12.5-25 mg by mouth 2 (two) times daily. 25 mg in the am & 12.5 mg in the pm   Yes Historical Provider, MD  nicotine (NICODERM CQ - DOSED IN MG/24 HOURS) 21 mg/24hr patch Place 21 mg onto the skin daily.   Yes Historical Provider, MD  nitroGLYCERIN (NITROSTAT) 0.4 MG SL tablet Place 1 tablet (0.4 mg total) under the tongue every 5 (five) minutes x 3 doses as needed for chest pain. 06/21/14  Yes Luke K Kilroy, PA-C  Polyethyl Glycol-Propyl Glycol (SYSTANE)  0.4-0.3 % SOLN Place 1 drop into both eyes 2 (two) times daily.    Yes Historical Provider, MD  pregabalin (LYRICA) 50 MG capsule Take 50 mg by mouth 2 (two) times daily.   Yes Historical Provider, MD  ACCU-CHEK AVIVA PLUS test strip  05/29/14   Historical Provider, MD   BP 131/80  Pulse 110  Temp(Src) 98.8 F (37.1 C) (Oral)  Resp 16  Ht 5\' 2"  (1.575 m)  Wt 211 lb (95.709 kg)  BMI 38.58 kg/m2  SpO2 99% Physical Exam  Nursing note and vitals reviewed. Constitutional: She is oriented to person, place, and time. She appears well-developed and well-nourished. No distress.  HENT:  Head: Normocephalic and atraumatic.  Eyes: EOM are normal.  Neck: Normal range of motion.  C-spine nontender.  Cardiovascular: Normal rate, regular rhythm and normal heart sounds.   Pulmonary/Chest: Effort normal and breath sounds normal.  Abdominal: Soft. She exhibits no distension. There is no tenderness.  Musculoskeletal: Normal range of motion.  Normal PT and DP pulse in left foot.  Tenderness and swelling around the distal left femur.  Limited range of motion of left knee.  Pain with range of motion of left hip.  No obvious deformity of left hip palpable.  Neurological: She is alert and oriented to person, place, and time.  Skin: Skin is warm and dry.  Psychiatric: She has a normal mood and affect. Judgment normal.    ED Course  Procedures (including critical care time) Labs Review Labs Reviewed  CBC WITH DIFFERENTIAL  BASIC METABOLIC PANEL  PROTIME-INR    Imaging Review Dg Chest 1 View  07/18/2014   CLINICAL DATA:  Left leg pain post fall forward onto the ground this morning  EXAM: CHEST - 1 VIEW  COMPARISON:  06/16/2014  FINDINGS: Cardiomediastinal silhouette is stable. Again noted status post right upper lobectomy and postradiation changes in right upper medially. Chronic elevation of the right hemidiaphragm. No acute infiltrate or pulmonary edema. Metallic fixation plate noted cervical spine.   IMPRESSION: No active disease.  Stable postsurgical and postradiation changes.   Electronically Signed   By: Lahoma Crocker M.D.   On: 07/18/2014 10:20   Dg Hip Complete Left  07/18/2014   CLINICAL DATA:  Status post fall this morning  EXAM: LEFT HIP - COMPLETE 2+ VIEW  COMPARISON:  None.  FINDINGS: There is no evidence of hip fracture or dislocation.  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  There is posterior spinal fusion at L4-5 with interbody spacer.  There is peripheral vascular atherosclerotic disease.  IMPRESSION: No acute osseous injury of the left hip.   Electronically Signed   By: Kathreen Devoid   On: 07/18/2014 10:20   Dg Femur Left  07/18/2014   CLINICAL DATA:  Fall.  Pain in upper leg.  Initial evaluation.  EXAM: LEFT FEMUR - 2 VIEW  COMPARISON:  None.  FINDINGS: Vascular calcification consistent with peripheral vascular disease noted. Angulated fracture of the distal femur with comminution is noted. The fracture is just above and may involve the femoral component of the patient's total left knee replacement. Degenerative changes left hip.  IMPRESSION: Angulated comminuted fracture of the distal left femoral metaphysis. The fracture is just above and may involve the femoral component of total knee replacement P   Electronically Signed   By: Marcello Moores  Register   On: 07/18/2014 10:20   Dg Knee Complete 4 Views Left  07/18/2014   CLINICAL DATA:  Fall forward onto the ground this morning, left leg pain  EXAM: LEFT KNEE - COMPLETE 4+ VIEW  COMPARISON:  None.  FINDINGS: Three views of the left knee submitted. There is displaced mild comminuted fracture in distal femoral metaphysis. Left knee prosthesis. The prosthesis  is still located.  IMPRESSION: Displaced comminuted fracture in distal femur just above the knee prosthesis.   Electronically Signed   By: Lahoma Crocker M.D.   On: 07/18/2014 10:22  I personally reviewed the imaging tests through PACS system I reviewed available ER/hospitalization  records through the EMR    EKG Interpretation   Date/Time:  Tuesday July 18 2014 09:13:09 EDT Ventricular Rate:  72 PR Interval:  178 QRS Duration: 78 QT Interval:  374 QTC Calculation: 409 R Axis:   -30 Text Interpretation:  Sinus rhythm Left axis deviation No significant  change was found Confirmed by Dayrin Stallone  MD, Areg Bialas ([id number]) on 07/18/2014  9:54:24 AM      MDM   Final diagnoses:  Periprosthetic fracture around internal prosthetic left knee joint    Distal femur fracture around the left knee prosthesis.  Orthopedic consultation.  12:06 PM Spoke with Dr Lynann Bologna. Plan will be for surgery tomorrow. NPO after midnight.  Given her comorbidities I think the patient is best served on the hospitalist service as she has COPD, coronary artery disease, diabetes.    Hoy Morn, MD 07/18/14 [place], MD 07/18/14 [phone]

## 2014-07-18 NOTE — Progress Notes (Addendum)
Called and attempted to get report x1. Left number for RN to call me back.

## 2014-07-18 NOTE — ED Notes (Signed)
Patient transported to X-ray 

## 2014-07-18 NOTE — Progress Notes (Signed)
[name] [id number] Code Status: full   Admission Data: 07/18/2014 6:23 PM Attending Provider:  hongalgi WLK:[id number],YZJQD G, MD Consults/ Treatment Team: Treatment Team:  Rounding Lbcardiology, MD  [name] is a 68 y.o. female patient admitted from ED awake, alert - oriented  X 3 - no acute distress noted.  VSS - Blood pressure 103/56, pulse 103, temperature 98 F (36.7 C), temperature source Oral, resp. rate 25, height 5\' 2"  (1.575 m), weight 95.936 kg (211 lb 8 oz), SpO2 96.00%.    IV in place, occlusive dsg intact without redness.  Orientation to room, and floor completed with information packet given to patient/family.  Patient declined safety video at this time.  Admission INP armband ID verified with patient/family, and in place.   SR up x 2, fall assessment complete, with patient and family able to verbalize understanding of risk associated with falls, and verbalized understanding to call nsg before up out of bed.  Call light within reach, patient able to voice, and demonstrate understanding.  Skin, clean-dry- intact without evidence of bruising, or skin tears.   No evidence of skin break down noted on exam.     Will cont to eval and treat per MD orders.  Dora Simeone, Helen Hashimoto, RN 07/18/2014 6:23 PM

## 2014-07-18 NOTE — ED Notes (Signed)
Attempted iv without success blood return unable to flush. Iv therapy called

## 2014-07-18 NOTE — ED Notes (Signed)
Pt SpO2 decreased to 86% on RA.  Placed pt on 3L nasal canula.  Pt reports she sleeps with a nasal cannula 2L - 4L at night.

## 2014-07-18 NOTE — ED Notes (Signed)
Waiting on Pod C for tele room.

## 2014-07-19 ENCOUNTER — Inpatient Hospital Stay (HOSPITAL_COMMUNITY): Payer: Medicare Other

## 2014-07-19 ENCOUNTER — Encounter (HOSPITAL_COMMUNITY): Payer: Medicare Other | Admitting: Anesthesiology

## 2014-07-19 ENCOUNTER — Encounter (HOSPITAL_COMMUNITY)
Admission: EM | Disposition: A | Discharge status: Skilled Nursing Facility | Payer: Self-pay | Source: Home / Self Care | Attending: Internal Medicine

## 2014-07-19 ENCOUNTER — Inpatient Hospital Stay (HOSPITAL_COMMUNITY): Payer: Medicare Other | Admitting: Anesthesiology

## 2014-07-19 DIAGNOSIS — I10 Essential (primary) hypertension: Secondary | ICD-10-CM

## 2014-07-19 DIAGNOSIS — J441 Chronic obstructive pulmonary disease with (acute) exacerbation: Secondary | ICD-10-CM

## 2014-07-19 DIAGNOSIS — I2510 Atherosclerotic heart disease of native coronary artery without angina pectoris: Secondary | ICD-10-CM

## 2014-07-19 DIAGNOSIS — K219 Gastro-esophageal reflux disease without esophagitis: Secondary | ICD-10-CM

## 2014-07-19 HISTORY — PX: FEMUR IM NAIL: SHX1597

## 2014-07-19 LAB — GLUCOSE, CAPILLARY
GLUCOSE-CAPILLARY: 293 mg/dL — AB (ref 70–99)
GLUCOSE-CAPILLARY: 273 mg/dL — AB (ref 70–99)
GLUCOSE-CAPILLARY: 244 mg/dL — AB (ref 70–99)
Glucose-Capillary: 268 mg/dL — ABNORMAL HIGH (ref 70–99)
Glucose-Capillary: 272 mg/dL — ABNORMAL HIGH (ref 70–99)
Glucose-Capillary: 165 mg/dL — ABNORMAL HIGH (ref 70–99)
Glucose-Capillary: 236 mg/dL — ABNORMAL HIGH (ref 70–99)

## 2014-07-19 LAB — BASIC METABOLIC PANEL
ANION GAP: 16 — AB (ref 5–15)
BUN: 22 mg/dL (ref 6–23)
CALCIUM: 8.8 mg/dL (ref 8.4–10.5)
CHLORIDE: 97 meq/L (ref 96–112)
CO2: 22 mEq/L (ref 19–32)
Creatinine, Ser: 1.1 mg/dL (ref 0.50–1.10)
GFR calc non Af Amer: 50 mL/min — ABNORMAL LOW (ref >90)
GFR, EST AFRICAN AMERICAN: 58 mL/min — AB (ref >90)
Glucose, Bld: 272 mg/dL — ABNORMAL HIGH (ref 70–99)
Potassium: 4.4 mEq/L (ref 3.7–5.3)
SODIUM: 135 meq/L — AB (ref 137–147)

## 2014-07-19 LAB — CBC
HCT: 38.3 % (ref 36.0–46.0)
Hemoglobin: 12.7 g/dL (ref 12.0–15.0)
MCH: 27.7 pg (ref 26.0–34.0)
MCHC: 33.2 g/dL (ref 30.0–36.0)
MCV: 83.4 fL (ref 78.0–100.0)
Platelets: 111 10*3/uL — ABNORMAL LOW (ref 150–400)
RBC: 4.59 MIL/uL (ref 3.87–5.11)
RDW: 14.9 % (ref 11.5–15.5)
WBC: 8.1 10*3/uL (ref 4.0–10.5)

## 2014-07-19 LAB — TROPONIN I
Troponin I: <0.3 ng/mL (ref <0.30)
Troponin I: <0.3 ng/mL (ref <0.30)

## 2014-07-19 LAB — URINALYSIS, ROUTINE W REFLEX MICROSCOPIC
Bilirubin Urine: NEGATIVE
Glucose, UA: >1000 mg/dL — AB
Hgb urine dipstick: NEGATIVE
Ketones, ur: 15 mg/dL — AB
Leukocytes, UA: NEGATIVE
Nitrite: NEGATIVE
Protein, ur: NEGATIVE mg/dL
Specific Gravity, Urine: 1.035 — ABNORMAL HIGH (ref 1.005–1.030)
Urobilinogen, UA: 0.2 mg/dL (ref 0.0–1.0)
pH: 5 (ref 5.0–8.0)

## 2014-07-19 LAB — URINE MICROSCOPIC-ADD ON

## 2014-07-19 SURGERY — INSERTION, INTRAMEDULLARY ROD, FEMUR
Anesthesia: General | Site: Leg Upper | Laterality: Left

## 2014-07-19 MED ORDER — 0.9 % SODIUM CHLORIDE (POUR BTL) OPTIME
TOPICAL | Status: DC | PRN
  Administered 2014-07-19: 1000 mL

## 2014-07-19 MED ORDER — OXYCODONE-ACETAMINOPHEN 5-325 MG PO TABS
1.0000 | ORAL_TABLET | Freq: Four times a day (QID) | ORAL | Status: DC | PRN
  Administered 2014-07-21: 1 via ORAL
  Filled 2014-07-19: qty 1

## 2014-07-19 MED ORDER — INSULIN DETEMIR 100 UNIT/ML ~~LOC~~ SOLN
50.0000 [IU] | Freq: Every day | SUBCUTANEOUS | Status: DC
  Administered 2014-07-19: 40 [IU] via SUBCUTANEOUS
  Filled 2014-07-19 (×2): qty 0.5

## 2014-07-19 MED ORDER — BUPIVACAINE HCL (PF) 0.25 % IJ SOLN
INTRAMUSCULAR | Status: AC
  Filled 2014-07-19: qty 30

## 2014-07-19 MED ORDER — VANCOMYCIN HCL IN DEXTROSE 1-5 GM/200ML-% IV SOLN
1000.0000 mg | Freq: Two times a day (BID) | INTRAVENOUS | Status: AC
  Administered 2014-07-19 – 2014-07-20 (×2): 1000 mg via INTRAVENOUS
  Filled 2014-07-19 (×3): qty 200

## 2014-07-19 MED ORDER — OXYCODONE HCL 5 MG PO TABS: 5.0000 mg | ORAL_TABLET | Freq: Once | ORAL | Status: DC | PRN

## 2014-07-19 MED ORDER — ROCURONIUM BROMIDE 100 MG/10ML IV SOLN
INTRAVENOUS | Status: DC | PRN
  Administered 2014-07-19: 50 mg via INTRAVENOUS

## 2014-07-19 MED ORDER — PANTOPRAZOLE SODIUM 40 MG IV SOLR
40.0000 mg | Freq: Once | INTRAVENOUS | Status: AC
  Administered 2014-07-19: 40 mg via INTRAVENOUS
  Filled 2014-07-19: qty 40

## 2014-07-19 MED ORDER — KETOROLAC TROMETHAMINE 30 MG/ML IJ SOLN
30.0000 mg | Freq: Once | INTRAMUSCULAR | Status: AC
  Administered 2014-07-19: 30 mg via INTRAVENOUS
  Filled 2014-07-19: qty 1

## 2014-07-19 MED ORDER — ONDANSETRON HCL 4 MG/2ML IJ SOLN
INTRAMUSCULAR | Status: AC
  Filled 2014-07-19: qty 2

## 2014-07-19 MED ORDER — PROPOFOL 10 MG/ML IV BOLUS
INTRAVENOUS | Status: AC
  Filled 2014-07-19: qty 20

## 2014-07-19 MED ORDER — MIDAZOLAM HCL 2 MG/2ML IJ SOLN
INTRAMUSCULAR | Status: AC
  Filled 2014-07-19: qty 2

## 2014-07-19 MED ORDER — HYDROMORPHONE HCL 1 MG/ML IJ SOLN
INTRAMUSCULAR | Status: AC
  Administered 2014-07-19: 0.5 mg via INTRAVENOUS
  Filled 2014-07-19: qty 1

## 2014-07-19 MED ORDER — SODIUM CHLORIDE 0.9 % IV BOLUS (SEPSIS)
500.0000 mL | Freq: Once | INTRAVENOUS | Status: AC
  Administered 2014-07-19: 500 mL via INTRAVENOUS

## 2014-07-19 MED ORDER — ONDANSETRON HCL 4 MG/2ML IJ SOLN
INTRAMUSCULAR | Status: DC | PRN
  Administered 2014-07-19: 4 mg via INTRAVENOUS

## 2014-07-19 MED ORDER — ONDANSETRON HCL 4 MG/2ML IJ SOLN: 4.0000 mg | Freq: Once | INTRAMUSCULAR | Status: DC | PRN

## 2014-07-19 MED ORDER — FENTANYL CITRATE 0.05 MG/ML IJ SOLN
INTRAMUSCULAR | Status: DC | PRN
  Administered 2014-07-19 (×2): 50 ug via INTRAVENOUS
  Administered 2014-07-19: 150 ug via INTRAVENOUS

## 2014-07-19 MED ORDER — BUPIVACAINE HCL (PF) 0.25 % IJ SOLN
INTRAMUSCULAR | Status: DC | PRN
  Administered 2014-07-19: 20 mL

## 2014-07-19 MED ORDER — NEOSTIGMINE METHYLSULFATE 10 MG/10ML IV SOLN
INTRAVENOUS | Status: DC | PRN
  Administered 2014-07-19: 4 mg via INTRAVENOUS

## 2014-07-19 MED ORDER — ROCURONIUM BROMIDE 50 MG/5ML IV SOLN
INTRAVENOUS | Status: AC
  Filled 2014-07-19: qty 1

## 2014-07-19 MED ORDER — LACTATED RINGERS IV SOLN
INTRAVENOUS | Status: DC | PRN
  Administered 2014-07-19 (×2): via INTRAVENOUS

## 2014-07-19 MED ORDER — PHENYLEPHRINE HCL 10 MG/ML IJ SOLN
10.0000 mg | INTRAVENOUS | Status: DC | PRN
  Administered 2014-07-19: 50 ug/min via INTRAVENOUS

## 2014-07-19 MED ORDER — NITROGLYCERIN 0.4 MG SL SUBL
SUBLINGUAL_TABLET | SUBLINGUAL | Status: AC
  Filled 2014-07-19: qty 1

## 2014-07-19 MED ORDER — PHENYLEPHRINE 40 MCG/ML (10ML) SYRINGE FOR IV PUSH (FOR BLOOD PRESSURE SUPPORT)
PREFILLED_SYRINGE | INTRAVENOUS | Status: AC
  Filled 2014-07-19: qty 10

## 2014-07-19 MED ORDER — CEFAZOLIN SODIUM-DEXTROSE 2-3 GM-% IV SOLR
INTRAVENOUS | Status: AC
  Filled 2014-07-19: qty 50

## 2014-07-19 MED ORDER — LIDOCAINE HCL (CARDIAC) 20 MG/ML IV SOLN
INTRAVENOUS | Status: DC | PRN
  Administered 2014-07-19: 75 mg via INTRAVENOUS

## 2014-07-19 MED ORDER — CEFAZOLIN SODIUM-DEXTROSE 2-3 GM-% IV SOLR
INTRAVENOUS | Status: DC | PRN
  Administered 2014-07-19: 2 g via INTRAVENOUS

## 2014-07-19 MED ORDER — ALBUTEROL SULFATE HFA 108 (90 BASE) MCG/ACT IN AERS
INHALATION_SPRAY | RESPIRATORY_TRACT | Status: DC | PRN
  Administered 2014-07-19 (×2): 4 via RESPIRATORY_TRACT

## 2014-07-19 MED ORDER — POLYETHYLENE GLYCOL 3350 17 G PO PACK
17.0000 g | PACK | Freq: Every day | ORAL | Status: DC
  Administered 2014-07-20 – 2014-07-21 (×2): 17 g via ORAL
  Filled 2014-07-19 (×3): qty 1

## 2014-07-19 MED ORDER — PROPOFOL 10 MG/ML IV BOLUS
INTRAVENOUS | Status: DC | PRN
  Administered 2014-07-19: 160 mg via INTRAVENOUS

## 2014-07-19 MED ORDER — MORPHINE SULFATE 2 MG/ML IJ SOLN
2.0000 mg | Freq: Once | INTRAMUSCULAR | Status: AC
  Administered 2014-07-19: 2 mg via INTRAVENOUS
  Filled 2014-07-19: qty 1

## 2014-07-19 MED ORDER — OXYCODONE HCL 5 MG/5ML PO SOLN: 5.0000 mg | Freq: Once | ORAL | Status: DC | PRN

## 2014-07-19 MED ORDER — SUCCINYLCHOLINE CHLORIDE 20 MG/ML IJ SOLN
INTRAMUSCULAR | Status: AC
  Filled 2014-07-19: qty 1

## 2014-07-19 MED ORDER — EPHEDRINE SULFATE 50 MG/ML IJ SOLN
INTRAMUSCULAR | Status: DC | PRN
  Administered 2014-07-19: 10 mg via INTRAVENOUS

## 2014-07-19 MED ORDER — GLYCOPYRROLATE 0.2 MG/ML IJ SOLN
INTRAMUSCULAR | Status: DC | PRN
  Administered 2014-07-19: 0.6 mg via INTRAVENOUS

## 2014-07-19 MED ORDER — PHENYLEPHRINE HCL 10 MG/ML IJ SOLN
INTRAMUSCULAR | Status: DC | PRN
  Administered 2014-07-19: 120 ug via INTRAVENOUS
  Administered 2014-07-19 (×2): 80 ug via INTRAVENOUS

## 2014-07-19 MED ORDER — HYDROMORPHONE HCL 1 MG/ML IJ SOLN
0.2500 mg | INTRAMUSCULAR | Status: DC | PRN
  Administered 2014-07-19 (×4): 0.5 mg via INTRAVENOUS

## 2014-07-19 MED ORDER — SODIUM CHLORIDE 0.9 % IV BOLUS (SEPSIS)
250.0000 mL | Freq: Once | INTRAVENOUS | Status: AC
  Administered 2014-07-19: 250 mL via INTRAVENOUS

## 2014-07-19 MED ORDER — FENTANYL CITRATE 0.05 MG/ML IJ SOLN
INTRAMUSCULAR | Status: AC
  Filled 2014-07-19: qty 5

## 2014-07-19 SURGICAL SUPPLY — 66 items
BANDAGE ELASTIC 4 VELCRO ST LF (GAUZE/BANDAGES/DRESSINGS) ×2 IMPLANT
BANDAGE ESMARK 6X9 LF (GAUZE/BANDAGES/DRESSINGS) IMPLANT
BIT DRILL CALIBRATED 4.3MMX365 (DRILL) IMPLANT
BIT DRILL CROWE PNT TWST 4.5MM (DRILL) IMPLANT
BNDG CMPR 9X6 STRL LF SNTH (GAUZE/BANDAGES/DRESSINGS) ×1
BNDG COHESIVE 6X5 TAN STRL LF (GAUZE/BANDAGES/DRESSINGS) ×2 IMPLANT
BNDG ESMARK 6X9 LF (GAUZE/BANDAGES/DRESSINGS) ×3
BNDG GAUZE ELAST 4 BULKY (GAUZE/BANDAGES/DRESSINGS) ×2 IMPLANT
COVER PERINEAL POST (MISCELLANEOUS) ×3 IMPLANT
COVER SURGICAL LIGHT HANDLE (MISCELLANEOUS) ×4 IMPLANT
CUFF TOURNIQUET SINGLE 34IN LL (TOURNIQUET CUFF) ×2 IMPLANT
DRAPE C-ARMOR (DRAPES) ×2 IMPLANT
DRAPE EXTREMITY TIBURON (DRAPES) ×2 IMPLANT
DRAPE PROXIMA HALF (DRAPES) ×2 IMPLANT
DRAPE STERI IOBAN 125X83 (DRAPES) ×3 IMPLANT
DRAPE U-SHAPE 47X51 STRL (DRAPES) ×4 IMPLANT
DRILL CALIBRATED 4.3MMX365 (DRILL) ×3
DRILL CROWE POINT TWIST 4.5MM (DRILL) ×3
DRSG ADAPTIC 3X8 NADH LF (GAUZE/BANDAGES/DRESSINGS) ×2 IMPLANT
DRSG MEPILEX BORDER 4X4 (GAUZE/BANDAGES/DRESSINGS) ×1 IMPLANT
DRSG MEPILEX BORDER 4X8 (GAUZE/BANDAGES/DRESSINGS) ×1 IMPLANT
DURAPREP 26ML APPLICATOR (WOUND CARE) ×3 IMPLANT
ELECT CAUTERY BLADE 6.4 (BLADE) ×3 IMPLANT
ELECT REM PT RETURN 9FT ADLT (ELECTROSURGICAL) ×3
ELECTRODE REM PT RTRN 9FT ADLT (ELECTROSURGICAL) ×1 IMPLANT
EVACUATOR 1/8 PVC DRAIN (DRAIN) IMPLANT
GAUZE XEROFORM 5X9 LF (GAUZE/BANDAGES/DRESSINGS) ×1 IMPLANT
GLOVE BIO SURGEON STRL SZ7 (GLOVE) ×2 IMPLANT
GLOVE BIOGEL PI IND STRL 7.0 (GLOVE) IMPLANT
GLOVE BIOGEL PI IND STRL 7.5 (GLOVE) IMPLANT
GLOVE BIOGEL PI IND STRL 8 (GLOVE) ×2 IMPLANT
GLOVE BIOGEL PI INDICATOR 7.0 (GLOVE) ×4
GLOVE BIOGEL PI INDICATOR 7.5 (GLOVE) ×2
GLOVE BIOGEL PI INDICATOR 8 (GLOVE) ×4
GLOVE ECLIPSE 7.5 STRL STRAW (GLOVE) ×10 IMPLANT
GLOVE SURG SS PI 7.0 STRL IVOR (GLOVE) ×4 IMPLANT
GOWN STRL REUS W/ TWL LRG LVL3 (GOWN DISPOSABLE) ×1 IMPLANT
GOWN STRL REUS W/ TWL XL LVL3 (GOWN DISPOSABLE) ×2 IMPLANT
GOWN STRL REUS W/TWL LRG LVL3 (GOWN DISPOSABLE) ×3
GOWN STRL REUS W/TWL XL LVL3 (GOWN DISPOSABLE) ×6
GUIDEWIRE BEAD TIP (WIRE) ×2 IMPLANT
KIT BASIN OR (CUSTOM PROCEDURE TRAY) ×3 IMPLANT
KIT ROOM TURNOVER OR (KITS) ×3 IMPLANT
LINER BOOT UNIVERSAL DISP (MISCELLANEOUS) ×3 IMPLANT
NAIL FEM RETRO 13.5X320 (Nail) ×2 IMPLANT
NS IRRIG 1000ML POUR BTL (IV SOLUTION) ×3 IMPLANT
PACK GENERAL/GYN (CUSTOM PROCEDURE TRAY) ×3 IMPLANT
PAD ABD 8X10 STRL (GAUZE/BANDAGES/DRESSINGS) ×4 IMPLANT
PAD ARMBOARD 7.5X6 YLW CONV (MISCELLANEOUS) ×6 IMPLANT
PIN GUIDE 3.2 903003004 (MISCELLANEOUS) ×2 IMPLANT
SCREW CORT TI DBL LEAD 5X30 (Screw) ×2 IMPLANT
SCREW CORT TI DBL LEAD 5X32 (Screw) ×2 IMPLANT
SCREW CORT TI DBL LEAD 5X60 (Screw) ×2 IMPLANT
SCREW CORT TI DBL LEAD 5X70 (Screw) ×2 IMPLANT
SCREW CORT TI DBL LEAD 5X80 (Screw) ×2 IMPLANT
SCREW CORT TI DBL LEAD 5X85 (Screw) ×2 IMPLANT
SPONGE GAUZE 4X4 12PLY STER LF (GAUZE/BANDAGES/DRESSINGS) ×2 IMPLANT
STAPLER VISISTAT 35W (STAPLE) ×5 IMPLANT
STOCKINETTE IMPERVIOUS LG (DRAPES) ×2 IMPLANT
SUT VIC AB 0 CTB1 27 (SUTURE) ×4 IMPLANT
SUT VIC AB 1 CTB1 27 (SUTURE) ×5 IMPLANT
SUT VIC AB 2-0 CTB1 (SUTURE) ×3 IMPLANT
TOWEL OR 17X24 6PK STRL BLUE (TOWEL DISPOSABLE) ×4 IMPLANT
TOWEL OR 17X26 10 PK STRL BLUE (TOWEL DISPOSABLE) ×3 IMPLANT
TRAY FOLEY CATH 14FRSI W/METER (CATHETERS) ×2 IMPLANT
WATER STERILE IRR 1000ML POUR (IV SOLUTION) ×3 IMPLANT

## 2014-07-19 NOTE — Progress Notes (Signed)
ANTIBIOTIC CONSULT NOTE - INITIAL  Pharmacy Consult for vancomycin x 1 day Indication: surgical prophylaxis, positive nasal swab  Allergies  Allergen Reactions  . Celebrex [Celecoxib] Palpitations and Other (See Comments)    Chest pain, tachycardia, diaphoresis  . Niacin Rash  . Varenicline Tartrate Rash    CHANTIX    Patient Measurements: Height: 5\' 2"  (157.5 cm) Weight: 211 lb 8 oz (95.936 kg) IBW/kg (Calculated) : 50.1   Vital Signs: Temp: 98.6 F (37 C) (10/14 1830) Temp Source: Oral (10/14 1830) BP: 124/46 mmHg (10/14 1830) Pulse Rate: 115 (10/14 1830) Intake/Output from previous day: 10/13 0701 - 10/14 0700 In: 500 [IV Piggyback:500] Out: 250 [Urine:250] Intake/Output from this shift: Total I/O In: 1540 [I.V.:1000; Other:40; IV Piggyback:500] Out: 1485 [Urine:1185; Blood:300]  Labs:  Recent Labs  07/18/14 1024 07/19/14 0550  WBC 9.0 8.1  HGB 14.4 12.7  PLT 136* 111*  CREATININE 0.80 1.10   Estimated Creatinine Clearance: 52.9 ml/min (by C-G formula based on Cr of 1.1). No results found for this basename: VANCOTROUGH, Corlis Leak, VANCORANDOM, GENTTROUGH, GENTPEAK, GENTRANDOM, TOBRATROUGH, TOBRAPEAK, TOBRARND, AMIKACINPEAK, AMIKACINTROU, AMIKACIN,  in the last 72 hours   Microbiology: Recent Results (from the past 720 hour(s))  SURGICAL PCR SCREEN     Status: Abnormal   Collection Time    07/18/14  6:36 PM      Result Value Ref Range Status   MRSA, PCR POSITIVE (*) NEGATIVE Final   Staphylococcus aureus POSITIVE (*) NEGATIVE Final   Comment:            The Xpert SA Assay (FDA     approved for NASAL specimens     in patients over 21 years of age),     is one component of     a comprehensive surveillance     program.  Test performance has     been validated by [employer] for patients greater     than or equal to 2 year old.     It is not intended     to diagnose infection nor to     guide or monitor treatment.    Medical History: Past  Medical History  Diagnosis Date  . Asthma   . Cholelithiasis   . Colon polyps   . Coronary artery disease     The pt had stenting of the midsegment of the right coronary artery in 2005 with the Cypher stent. The last catheterization demonstrated an EF of 60%. The right coronary artery stent was patent. The left  main was normal. Circumflex had luminal irregularities. The LAD had luminal irregularities.   . Diabetes mellitus     Type II  . GERD (gastroesophageal reflux disease)   . Hyperlipidemia   . Tobacco abuse   . Arthritis   . COPD (chronic obstructive pulmonary disease)     USES O2 AT NIGHT + PRN IN DAYTIME3L  . Emphysema   . H/O hiatal hernia   . Hypertension   . Fibromyalgia   . Lung cancer 2010    Adenocarcinoma, right lung, node positive  . On home oxygen therapy     "2-4L at night and prn" (07/18/2104)  . Chronic bronchitis     "get it q yr" (07/18/2014)    Medications:  Prescriptions prior to admission  Medication Sig Dispense Refill  . albuterol (PROVENTIL) (2.5 MG/3ML) 0.083% nebulizer solution Take 2.5 mg by nebulization every 6 (six) hours as needed (bronchitis).      [name]  aspirin EC 81 MG EC tablet Take 1 tablet (81 mg total) by mouth daily.      [name] atorvastatin (LIPITOR) 40 MG tablet Take 1 tablet (40 mg total) by mouth daily at 6 PM.  30 tablet  11  . calcium carbonate (TUMS - DOSED IN MG ELEMENTAL CALCIUM) 500 MG chewable tablet Chew 3 tablets by mouth daily as needed for indigestion or heartburn.      . Cholecalciferol (VITAMIN D) 1000 UNITS capsule Take 1,000 Units by mouth daily.       . clopidogrel (PLAVIX) 75 MG tablet Take 1 tablet (75 mg total) by mouth daily with breakfast.  30 tablet  11  . esomeprazole (NEXIUM) 40 MG capsule Take 40 mg by mouth as needed (for heartburm/ acid reflux).       [name] HYDROcodone-acetaminophen (NORCO/VICODIN) 5-325 MG per tablet Take 1 tablet by mouth every 6 (six) hours as needed for moderate pain.      [name] ibuprofen (ADVIL,MOTRIN)  200 MG tablet Take 600 mg by mouth every 4 (four) hours as needed (pain).       . insulin aspart (NOVOLOG) 100 UNIT/ML injection Inject 10-25 Units into the skin 3 (three) times daily before meals. Units given depends on her CBG readings. < 200 gives 10 units, > 200 gives 20 units, 20-25 units at supper depending on CBG reading      . Insulin Detemir (LEVEMIR FLEXTOUCH) 100 UNIT/ML Pen Inject 45 Units into the skin at bedtime.      . Ipratropium-Albuterol (COMBIVENT RESPIMAT) 20-100 MCG/ACT AERS respimat Inhale 1 puff into the lungs 2 (two) times daily as needed for wheezing.      [name] lisinopril (PRINIVIL,ZESTRIL) 10 MG tablet Take 10 mg by mouth daily.       . metoprolol tartrate (LOPRESSOR) 25 MG tablet Take 12.5-25 mg by mouth 2 (two) times daily. 25 mg in the am & 12.5 mg in the pm      . nicotine (NICODERM CQ - DOSED IN MG/24 HOURS) 21 mg/24hr patch Place 21 mg onto the skin daily.      . nitroGLYCERIN (NITROSTAT) 0.4 MG SL tablet Place 1 tablet (0.4 mg total) under the tongue every 5 (five) minutes x 3 doses as needed for chest pain.  25 tablet  2  . Polyethyl Glycol-Propyl Glycol (SYSTANE) 0.4-0.3 % SOLN Place 1 drop into both eyes 2 (two) times daily.       . pregabalin (LYRICA) 50 MG capsule Take 50 mg by mouth 2 (two) times daily.      [name] ACCU-CHEK AVIVA PLUS test strip        Assessment: 68 yo F s/p IM nail for L femur fx.  Pharmacy consulted to dose vancomycin x 1 day for surgical prophylaxis and positive nasal swab for MRSA.  Wt 95.9 kg.  She was given Ancef 2 gm preop at 1348.  Anesthesia end time 1612.   WBC 8.1, AF; Creat 1.1,   Goal of Therapy:  Vancomycin trough level 10-15 mcg/ml  Plan:  Vancomycin 1 gm IV q12 x 2 doses to cover x 24 hours post op.  Eudelia Bunch, Pharm.D. [phone] 07/19/2014 6:44 PM

## 2014-07-19 NOTE — Anesthesia Postprocedure Evaluation (Signed)
  Anesthesia Post-op Note  Patient: [name]  Procedure(s) Performed: Procedure(s): INTRAMEDULLARY (IM) NAIL FEMORAL (Left)  Patient Location: PACU  Anesthesia Type: General   Level of Consciousness: awake, alert  and oriented  Airway and Oxygen Therapy: Patient Spontanous Breathing  Post-op Pain: mild  Post-op Assessment: Post-op Vital signs reviewed  Post-op Vital Signs: Reviewed  Last Vitals:  Filed Vitals:   07/19/14 1605  BP: 150/65  Pulse: 106  Temp: 36.6 C  Resp: 16    Complications: No apparent anesthesia complications

## 2014-07-19 NOTE — Transfer of Care (Signed)
Immediate Anesthesia Transfer of Care Note  Patient: [name]  Procedure(s) Performed: Procedure(s): INTRAMEDULLARY (IM) NAIL FEMORAL (Left)  Patient Location: PACU  Anesthesia Type:General  Level of Consciousness: awake and alert   Airway & Oxygen Therapy: Patient Spontanous Breathing and Patient connected to nasal cannula oxygen  Post-op Assessment: Report given to PACU RN, Post -op Vital signs reviewed and stable and Patient moving all extremities  Post vital signs: Reviewed and stable  Complications: No apparent anesthesia complications

## 2014-07-19 NOTE — Progress Notes (Signed)
Report given to short stay RN

## 2014-07-19 NOTE — Progress Notes (Signed)
K. SchorrNp in to assessed pt. Still with intermittent pressure lt. Chest below the breast, with some wheezing lower base of the lungs resp. In and tx. was given. Pt. Stated tightness was lessen but still comes and go intermittently. breathing a little better per patient. Felt nauseated as well  And was given a dose of zofran 4mg . IV with relief. Awaiting for blood work results and K. SchorrNP will notifiy cardiology this morning. Family updated this morning. Will continue to monitor pt.

## 2014-07-19 NOTE — Progress Notes (Signed)
Event: Notified by RN at approx 0530 that pt w/ c/o feeling "whoozy". Automatic SBP noted to be 70's. Requested manual BP which was 90/50) and EKG. NP to bedside. Subjective: Pt describes feeling "whoozy" and sweaty after which she c/o chest "pressure" that she also described as "heavy", "tight" and "burning". Reported pain under (L) breast area and non-radiating. Pain is associated w/ nausea. She did not report relief w/ SL NTG. Pt concerned about her "blockages in my heart".  Objective: [name] was admitted 07/18/14 after a fall at home during which she sustained a (L) distal femur fx. She has a significant h/o CAD, s/p recent PCI with DES and prior stenting of the RCA: Denied CP on admission but did report occasional chest pressure which she attributed to her COPD. At bedside pt noted lying quietly in NAD. She is oriented to person and place. Skin cool and dry. BBS w/ exp wheezes R>L. 12-lead EKG normal. Initial BP's hypotensive (SBP 70-80's) but when more properly fitting BP cuff applied SBP in 120's. Stat troponin drawn. Pt given IV Zofran, IV Protonix and albuterol neb. At the time of my departure pt appeared to be feeling somewhat better.  Assessment/Plan: 1. Chest pain: Unclear etiology at this time though given pt's hx worrisome for ACS. Reflux and respiratory souces possible as well. EKG w/o acute changes. Cycle troponin's. Will await result of first troponin and defer further changes in plan of care to rounding MD. Discussed pt w/ Dr Hal Hope who also reviewed EKG and is in agreement w/ plan.  Jeryl Columbia, NP-C [facility] Pager [phone]

## 2014-07-19 NOTE — Progress Notes (Signed)
Duplicate, please see Dr. Kyla Balzarine note on same day

## 2014-07-19 NOTE — Anesthesia Preprocedure Evaluation (Signed)
Anesthesia Evaluation  Patient identified by MRN, date of birth, ID band Patient awake    Reviewed: Allergy & Precautions, H&P , NPO status , Patient's Chart, lab work & pertinent test results  Airway Mallampati: I TM Distance: >3 FB Neck ROM: Full    Dental  (+) Dental Advisory Given, Edentulous Upper, Edentulous Lower   Pulmonary COPD COPD inhaler, former smoker,  breath sounds clear to auscultation        Cardiovascular hypertension, Pt. on medications + CAD Rhythm:Regular Rate:Normal     Neuro/Psych    GI/Hepatic GERD-  Medicated and Controlled,  Endo/Other  diabetes, Well Controlled, Type 2, Insulin Dependent, Oral Hypoglycemic AgentsMorbid obesity  Renal/GU      Musculoskeletal   Abdominal   Peds  Hematology   Anesthesia Other Findings   Reproductive/Obstetrics                           Anesthesia Physical Anesthesia Plan  ASA: III  Anesthesia Plan: General   Post-op Pain Management:    Induction: Intravenous  Airway Management Planned: Oral ETT  Additional Equipment:   Intra-op Plan:   Post-operative Plan: Extubation in OR  Informed Consent: I have reviewed the patients History and Physical, chart, labs and discussed the procedure including the risks, benefits and alternatives for the proposed anesthesia with the patient or authorized representative who has indicated his/her understanding and acceptance.   Dental advisory given  Plan Discussed with: CRNA, Anesthesiologist and Surgeon  Anesthesia Plan Comments:         Anesthesia Quick Evaluation

## 2014-07-19 NOTE — Progress Notes (Deleted)
Paged provider on call K. Schorr about pt's bp of 90/50 manually. Awaiting further orders.

## 2014-07-19 NOTE — Progress Notes (Signed)
Paged provider on call. K. Schorr about pt's bp 90/50 manually. Awaiting further orders.

## 2014-07-19 NOTE — Anesthesia Procedure Notes (Signed)
Procedure Name: Intubation Date/Time: 07/19/2014 1:56 PM Performed by: Trixie Deis A Pre-anesthesia Checklist: Patient identified, Timeout performed, Emergency Drugs available, Suction available and Patient being monitored Patient Re-evaluated:Patient Re-evaluated prior to inductionOxygen Delivery Method: Circle system utilized Preoxygenation: Pre-oxygenation with 100% oxygen Intubation Type: IV induction Ventilation: Mask ventilation without difficulty and Oral airway inserted - appropriate to patient size Laryngoscope Size: Mac and 3 Grade View: Grade I Tube type: Oral Tube size: 7.0 mm Number of attempts: 1 Airway Equipment and Method: Stylet Placement Confirmation: ETT inserted through vocal cords under direct vision,  breath sounds checked- equal and bilateral and positive ETCO2 Secured at: 21 cm Tube secured with: Tape Dental Injury: Teeth and Oropharynx as per pre-operative assessment

## 2014-07-19 NOTE — Progress Notes (Signed)
Patient ID: [name], female   DOB: 08-Aug-1946, 68 y.o.   MRN: [id number]    Subjective:  No chest pain left leg hurts  BP soft   Objective:  Filed Vitals:   07/19/14 0622 07/19/14 0630 07/19/14 0806 07/19/14 0924  BP: 97/52 98/46 88/40  97/45  Pulse: 94 96 100 87  Temp:      TempSrc:      Resp:      Height:      Weight:      SpO2: 97% 97%      Intake/Output from previous day:  Intake/Output Summary (Last 24 hours) at 07/19/14 1018 Last data filed at 07/19/14 0900  Gross per 24 hour  Intake    540 ml  Output    450 ml  Net     90 ml    Physical Exam: Affect appropriate Elderly female in mild distress  HEENT: normal Neck supple with no adenopathy JVP normal no bruits no thyromegaly Lungs clear with no wheezing and good diaphragmatic motion Heart:  S1/S2 SEM  murmur, no rub, gallop or click PMI normal Abdomen: benighn, BS positve, no tenderness, no AAA no bruit.  No HSM or HJR Distal pulses intact with no bruits No edema Neuro non-focal Skin warm and dry LLE femur fracture    Lab Results: Basic Metabolic Panel:  Recent Labs  07/18/14 1024 07/18/14 1234 07/19/14 0550  NA 136*  --  135*  K 5.9* 4.7 4.4  CL 97  --  97  CO2 25  --  22  GLUCOSE 317*  --  272*  BUN 19  --  22  CREATININE 0.80  --  1.10  CALCIUM 10.0  --  8.8   CBC:  Recent Labs  07/18/14 1024 07/19/14 0550  WBC 9.0 8.1  NEUTROABS 6.6  --   HGB 14.4 12.7  HCT 44.0 38.3  MCV 82.9 83.4  PLT 136* 111*   Cardiac Enzymes:  Recent Labs  07/19/14 0550  TROPONINI <0.30    Imaging: Dg Chest 1 View  07/18/2014   CLINICAL DATA:  Left leg pain post fall forward onto the ground this morning  EXAM: CHEST - 1 VIEW  COMPARISON:  06/16/2014  FINDINGS: Cardiomediastinal silhouette is stable. Again noted status post right upper lobectomy and postradiation changes in right upper medially. Chronic elevation of the right hemidiaphragm. No acute infiltrate or pulmonary edema. Metallic  fixation plate noted cervical spine.  IMPRESSION: No active disease.  Stable postsurgical and postradiation changes.   Electronically Signed   By: Lahoma Crocker M.D.   On: 07/18/2014 10:20   Dg Hip Complete Left  07/18/2014   CLINICAL DATA:  Status post fall this morning  EXAM: LEFT HIP - COMPLETE 2+ VIEW  COMPARISON:  None.  FINDINGS: There is no evidence of hip fracture or dislocation.  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  There is posterior spinal fusion at L4-5 with interbody spacer.  There is peripheral vascular atherosclerotic disease.  IMPRESSION: No acute osseous injury of the left hip.   Electronically Signed   By: Kathreen Devoid   On: 07/18/2014 10:20   Dg Femur Left  07/18/2014   CLINICAL DATA:  Fall.  Pain in upper leg.  Initial evaluation.  EXAM: LEFT FEMUR - 2 VIEW  COMPARISON:  None.  FINDINGS: Vascular calcification consistent with peripheral vascular disease noted. Angulated fracture of the distal femur with comminution is noted. The fracture is just above and may involve  the femoral component of the patient's total left knee replacement. Degenerative changes left hip.  IMPRESSION: Angulated comminuted fracture of the distal left femoral metaphysis. The fracture is just above and may involve the femoral component of total knee replacement P   Electronically Signed   By: Marcello Moores  Register   On: 07/18/2014 10:20   Dg Knee Complete 4 Views Left  07/18/2014   CLINICAL DATA:  Fall forward onto the ground this morning, left leg pain  EXAM: LEFT KNEE - COMPLETE 4+ VIEW  COMPARISON:  None.  FINDINGS: Three views of the left knee submitted. There is displaced mild comminuted fracture in distal femoral metaphysis. Left knee prosthesis. The prosthesis is still located.  IMPRESSION: Displaced comminuted fracture in distal femur just above the knee prosthesis.   Electronically Signed   By: Lahoma Crocker M.D.   On: 07/18/2014 10:22    Cardiac Studies:  ECG:    Telemetry:  NSR no arrhythmia    Echo:   Medications:   . aspirin EC  81 mg Oral Daily  . atorvastatin  40 mg Oral q1800  . Chlorhexidine Gluconate Cloth  6 each Topical Q0600  . cholecalciferol  1,000 Units Oral Daily  . clopidogrel  75 mg Oral Daily  . heparin  5,000 Units Subcutaneous 3 times per day  . Influenza vac split quadrivalent PF  0.5 mL Intramuscular Tomorrow-1000  . insulin aspart  0-15 Units Subcutaneous TID WC  . insulin aspart  0-5 Units Subcutaneous QHS  . insulin aspart  6 Units Subcutaneous TID WC  . insulin detemir  40 Units Subcutaneous QHS  . lisinopril  10 mg Oral Daily  . metoprolol tartrate  12.5-25 mg Oral BID  . mupirocin ointment  1 application Nasal BID  . nicotine  21 mg Transdermal Daily  . pantoprazole  40 mg Oral Daily  . polyvinyl alcohol  1 drop Both Eyes BID  . pregabalin  50 mg Oral BID  . senna  1 tablet Oral BID  . sodium chloride  3 mL Intravenous Q12H       Assessment/Plan:  CAD:  Distant history of RCA stent  Circumflex stent 1 month ago See note from Dr Claiborne Billings  Last dose of plavix last night at 10:00 pm  Plan for surgery today  ? Hold Plavix for 48 hours.  Increased risk of stent thrombosis with DES a month ago BB as tolerated Hydrate for soft BP follow Hct 6 point fall since yesterday  Jenkins Rouge 07/19/2014, 10:18 AM

## 2014-07-19 NOTE — Progress Notes (Signed)
Dr. Hartford Poli made aware of patient's status over night as well as BP 88/40 manual and patient's complaints of chest pain. All other VS stable. Orders to check VS Q4. Pt has no other complaints or signs of distress.

## 2014-07-19 NOTE — Progress Notes (Addendum)
Pt. Had episode of feeling funny and felt sweaty, complained of short of breath, vitals signs taken BP was low HR in the 80's still shows sinus in the monitor. Blood sugar was checked >200. K. SchorrNP  was paged and made aware as well rapid response. NS 500cc bolus started IV per RT and orderd was placed. Manual BP was taken prior to bolus  90/50. Pt. Complained of chest pressure as well 12 lead EKG  done, rate pain 5/10 scale  After bolus  Of fluids SBP up to >90's-100's . K. ShorrNP made aware with order to give 1 NTG SL. For chest pressure, dose given. Am labs drawn including cardiac enzymes.

## 2014-07-19 NOTE — Progress Notes (Signed)
[facility] PROGRESS NOTE   CARYLON TAMBURRO BDZ:[id number] DOB: [birth year] DOA: 07/18/2014 PCP: [name], MD  HPI/Subjective: Complaining about chest pain earlier today, improved. Blood pressure soft. I'll give bolus of normal saline.  Assessment/Plan: Principal Problem:   Fracture, femur, distal Active Problems:   TOBACCO ABUSE   Essential hypertension   COPD (chronic obstructive pulmonary disease) with chronic bronchitis   CAD- OM2 DES 06/20/14   GERD (gastroesophageal reflux disease)   Hyperlipidemia    Left femoral fracture -Left femoral periprosthetic fracture. Orthopedics consulted. -Plans for intramedullary fixation today. -Patient is on heparin subcutaneously for DVT prophylaxis held this morning restart postsurgically -Control pain with narcotics.  Essential hypertension -Blood pressure soft, hold lisinopril. Continue metoprolol. -Boluses of IV fluids if needed.  CAD -Patient complained this morning about chest pain which is resolved now. -Has history of recent stent to the circumflex placed on 06/20/14. -Per cardiac catheterization at that time no other significant stenotic lesions.  Type 2 diabetes mellitus -Uncontrolled type 2 diabetes mellitus, hemoglobin A1c was 8.9 on 06/19/14. -Continued her on dose of insulin, we'll increase the Levemir does tonight.  History of lung cancer -Outpatient followup, reported to be in remission.  GERD -Continue PPI  Code Status: Full code Family Communication: Plan discussed with the patient. Disposition Plan: Remains inpatient   Consultants:  Cardiology.  Orthopedics  Procedures:    Antibiotics:  Perioperative Ancef.   Objective: Filed Vitals:   07/19/14 1028  BP: 93/42  Pulse: 98  Temp: 98.7 F (37.1 C)  Resp: 16    Intake/Output Summary (Last 24 hours) at 07/19/14 1100 Last data filed at 07/19/14 0900  Gross per 24 hour  Intake    540 ml  Output    450 ml  Net     90 ml    Filed Weights   07/18/14 0911 07/18/14 1815  Weight: 95.709 kg (211 lb) 95.936 kg (211 lb 8 oz)    Exam: General: Alert and awake, oriented x3, not in any acute distress. HEENT: anicteric sclera, pupils reactive to light and accommodation, EOMI CVS: S1-S2 clear, no murmur rubs or gallops Chest: clear to auscultation bilaterally, no wheezing, rales or rhonchi Abdomen: soft nontender, nondistended, normal bowel sounds, no organomegaly Extremities: no cyanosis, clubbing or edema noted bilaterally Neuro: Cranial nerves II-XII intact, no focal neurological deficits  Data Reviewed: Basic Metabolic Panel:  Recent Labs Lab 07/18/14 1024 07/18/14 1234 07/19/14 0550  NA 136*  --  135*  K 5.9* 4.7 4.4  CL 97  --  97  CO2 25  --  22  GLUCOSE 317*  --  272*  BUN 19  --  22  CREATININE 0.80  --  1.10  CALCIUM 10.0  --  8.8   Liver Function Tests: No results found for this basename: AST, ALT, ALKPHOS, BILITOT, PROT, ALBUMIN,  in the last 168 hours No results found for this basename: LIPASE, AMYLASE,  in the last 168 hours No results found for this basename: AMMONIA,  in the last 168 hours CBC:  Recent Labs Lab 07/18/14 1024 07/19/14 0550  WBC 9.0 8.1  NEUTROABS 6.6  --   HGB 14.4 12.7  HCT 44.0 38.3  MCV 82.9 83.4  PLT 136* 111*   Cardiac Enzymes:  Recent Labs Lab 07/19/14 0550  TROPONINI <0.30   BNP (last 3 results)  Recent Labs  06/16/14 1603  PROBNP 142.5*   CBG:  Recent Labs Lab 07/18/14 1620 07/18/14 2216 07/19/14 0436 07/19/14 0509 07/19/14  0756  GLUCAP 271* 312* 293* 268* 272*    Micro Recent Results (from the past 240 hour(s))  SURGICAL PCR SCREEN     Status: Abnormal   Collection Time    07/18/14  6:36 PM      Result Value Ref Range Status   MRSA, PCR POSITIVE (*) NEGATIVE Final   Staphylococcus aureus POSITIVE (*) NEGATIVE Final   Comment:            The Xpert SA Assay (FDA     approved for NASAL specimens     in patients over 2  years of age),     is one component of     a comprehensive surveillance     program.  Test performance has     been validated by [employer] for patients greater     than or equal to 39 year old.     It is not intended     to diagnose infection nor to     guide or monitor treatment.     Studies: Dg Chest 1 View  07/18/2014   CLINICAL DATA:  Left leg pain post fall forward onto the ground this morning  EXAM: CHEST - 1 VIEW  COMPARISON:  06/16/2014  FINDINGS: Cardiomediastinal silhouette is stable. Again noted status post right upper lobectomy and postradiation changes in right upper medially. Chronic elevation of the right hemidiaphragm. No acute infiltrate or pulmonary edema. Metallic fixation plate noted cervical spine.  IMPRESSION: No active disease.  Stable postsurgical and postradiation changes.   Electronically Signed   By: Lahoma Crocker M.D.   On: 07/18/2014 10:20   Dg Hip Complete Left  07/18/2014   CLINICAL DATA:  Status post fall this morning  EXAM: LEFT HIP - COMPLETE 2+ VIEW  COMPARISON:  None.  FINDINGS: There is no evidence of hip fracture or dislocation.  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  There is posterior spinal fusion at L4-5 with interbody spacer.  There is peripheral vascular atherosclerotic disease.  IMPRESSION: No acute osseous injury of the left hip.   Electronically Signed   By: Kathreen Devoid   On: 07/18/2014 10:20   Dg Femur Left  07/18/2014   CLINICAL DATA:  Fall.  Pain in upper leg.  Initial evaluation.  EXAM: LEFT FEMUR - 2 VIEW  COMPARISON:  None.  FINDINGS: Vascular calcification consistent with peripheral vascular disease noted. Angulated fracture of the distal femur with comminution is noted. The fracture is just above and may involve the femoral component of the patient's total left knee replacement. Degenerative changes left hip.  IMPRESSION: Angulated comminuted fracture of the distal left femoral metaphysis. The fracture is just above and may  involve the femoral component of total knee replacement P   Electronically Signed   By: Marcello Moores  Register   On: 07/18/2014 10:20   Dg Knee Complete 4 Views Left  07/18/2014   CLINICAL DATA:  Fall forward onto the ground this morning, left leg pain  EXAM: LEFT KNEE - COMPLETE 4+ VIEW  COMPARISON:  None.  FINDINGS: Three views of the left knee submitted. There is displaced mild comminuted fracture in distal femoral metaphysis. Left knee prosthesis. The prosthesis is still located.  IMPRESSION: Displaced comminuted fracture in distal femur just above the knee prosthesis.   Electronically Signed   By: Lahoma Crocker M.D.   On: 07/18/2014 10:22    Scheduled Meds: . aspirin EC  81 mg Oral Daily  .  atorvastatin  40 mg Oral q1800  . Chlorhexidine Gluconate Cloth  6 each Topical Q0600  . cholecalciferol  1,000 Units Oral Daily  . heparin  5,000 Units Subcutaneous 3 times per day  . Influenza vac split quadrivalent PF  0.5 mL Intramuscular Tomorrow-1000  . insulin aspart  0-15 Units Subcutaneous TID WC  . insulin aspart  0-5 Units Subcutaneous QHS  . insulin aspart  6 Units Subcutaneous TID WC  . insulin detemir  40 Units Subcutaneous QHS  . lisinopril  10 mg Oral Daily  . metoprolol tartrate  12.5-25 mg Oral BID  . mupirocin ointment  1 application Nasal BID  . nicotine  21 mg Transdermal Daily  . pantoprazole  40 mg Oral Daily  . polyvinyl alcohol  1 drop Both Eyes BID  . pregabalin  50 mg Oral BID  . senna  1 tablet Oral BID  . sodium chloride  3 mL Intravenous Q12H   Continuous Infusions:      Time spent: 35 minutes    [facility] A  [facility] Pager [phone] If 7PM-7AM, please contact night-coverage at [email or web address], password [facility] 07/19/2014, 11:00 AM  LOS: 1 day

## 2014-07-19 NOTE — Progress Notes (Signed)
Subjective: Painful l knee after fall.  Pt with very distal femur fracture above tkr   Objective: Vital signs in last 24 hours: Temp:  [98 F (36.7 C)-98.7 F (37.1 C)] 98.7 F (37.1 C) (10/14 1028) Pulse Rate:  [76-108] 98 (10/14 1028) Resp:  [16-25] 16 (10/14 1028) BP: (71-134)/(32-75) 93/42 mmHg (10/14 1028) SpO2:  [95 %-100 %] 99 % (10/14 1028) Weight:  [211 lb 8 oz (95.936 kg)] 211 lb 8 oz (95.936 kg) (10/13 1815)  Intake/Output from previous day: 10/13 0701 - 10/14 0700 In: 500 [IV Piggyback:500] Out: 250 [Urine:250] Intake/Output this shift: Total I/O In: 540 [Other:40; IV Piggyback:500] Out: 300 [Urine:300]   Recent Labs  07/18/14 1024 07/19/14 0550  HGB 14.4 12.7    Recent Labs  07/18/14 1024 07/19/14 0550  WBC 9.0 8.1  RBC 5.31* 4.59  HCT 44.0 38.3  PLT 136* 111*    Recent Labs  07/18/14 1024 07/18/14 1234 07/19/14 0550  NA 136*  --  135*  K 5.9* 4.7 4.4  CL 97  --  97  CO2 25  --  22  BUN 19  --  22  CREATININE 0.80  --  1.10  GLUCOSE 317*  --  272*  CALCIUM 10.0  --  8.8    Recent Labs  07/18/14 1024  INR 0.90    Neurologically intact Neurovascular intact Sensation intact distally Intact pulses distally Dorsiflexion/Plantar flexion intact Compartment soft  Assessment/Plan: 68 yo ambulator who fell and fractured above TKR put in 12+ years  Ago.  Had long talk with patient and family but feeel she neds this fixed and given cardiac issues needss to have this done while continuing plavix and will discuss even greater anti-coagulation post op given immobility. Pt and family understand risks and benefits of surgery.  They understand risk of infection, bleeding, need for further surgery, and even death from surgery or cardiac issues in peri-operative period and wish to proceed with surgery.   [name] L 07/19/2014, 12:37 PM

## 2014-07-19 NOTE — Brief Op Note (Signed)
07/18/2014 - 07/19/2014  4:07 PM  PATIENT:  [name]  68 y.o. female  PRE-OPERATIVE DIAGNOSIS:  Left distal femur fracture  POST-OPERATIVE DIAGNOSIS:  Left distal femur fracture  PROCEDURE:  Procedure(s): INTRAMEDULLARY (IM) NAIL FEMORAL (Left)  SURGEON:  Surgeon(s) and Role:    * Alta Corning, MD - Primary  PHYSICIAN ASSISTANT:   ASSISTANTS: Atiyah Bauer   ANESTHESIA:   general  EBL:  Total I/O In: 540 [Other:40; IV Piggyback:500] Out: 1235 [Urine:935; Blood:300]  BLOOD ADMINISTERED:none  DRAINS: none   LOCAL MEDICATIONS USED:  MARCAINE     SPECIMEN:  No Specimen  DISPOSITION OF SPECIMEN:  N/A  COUNTS:  YES  TOURNIQUET:   Total Tourniquet Time Documented: Thigh (Left) - 63 minutes Total: Thigh (Left) - 63 minutes   DICTATION: .Other Dictation: Dictation Number [id number]  PLAN OF CARE: Admit to inpatient   PATIENT DISPOSITION:  PACU - hemodynamically stable.   Delay start of Pharmacological VTE agent (>24hrs) due to surgical blood loss or risk of bleeding: no

## 2014-07-19 NOTE — Progress Notes (Signed)
Utilization review completed.  

## 2014-07-19 NOTE — Progress Notes (Signed)
Report received from [facility] from PACU

## 2014-07-20 ENCOUNTER — Encounter (HOSPITAL_COMMUNITY): Payer: Self-pay | Admitting: Nurse Practitioner

## 2014-07-20 DIAGNOSIS — S72402A Unspecified fracture of lower end of left femur, initial encounter for closed fracture: Secondary | ICD-10-CM

## 2014-07-20 DIAGNOSIS — J700 Acute pulmonary manifestations due to radiation: Secondary | ICD-10-CM

## 2014-07-20 DIAGNOSIS — E119 Type 2 diabetes mellitus without complications: Secondary | ICD-10-CM

## 2014-07-20 DIAGNOSIS — T8489XA Other specified complication of internal orthopedic prosthetic devices, implants and grafts, initial encounter: Secondary | ICD-10-CM | POA: Diagnosis not present

## 2014-07-20 DIAGNOSIS — Z9981 Dependence on supplemental oxygen: Secondary | ICD-10-CM

## 2014-07-20 LAB — CBC
HCT: 30.8 % — ABNORMAL LOW (ref 36.0–46.0)
Hemoglobin: 10.2 g/dL — ABNORMAL LOW (ref 12.0–15.0)
MCH: 28 pg (ref 26.0–34.0)
MCHC: 33.1 g/dL (ref 30.0–36.0)
MCV: 84.6 fL (ref 78.0–100.0)
PLATELETS: 97 10*3/uL — AB (ref 150–400)
RBC: 3.64 MIL/uL — ABNORMAL LOW (ref 3.87–5.11)
RDW: 14.9 % (ref 11.5–15.5)
WBC: 7.9 10*3/uL (ref 4.0–10.5)

## 2014-07-20 LAB — BASIC METABOLIC PANEL
ANION GAP: 11 (ref 5–15)
BUN: 15 mg/dL (ref 6–23)
CALCIUM: 7.9 mg/dL — AB (ref 8.4–10.5)
CO2: 25 mEq/L (ref 19–32)
Chloride: 101 mEq/L (ref 96–112)
Creatinine, Ser: 0.79 mg/dL (ref 0.50–1.10)
GFR calc Af Amer: >90 mL/min (ref >90)
GFR, EST NON AFRICAN AMERICAN: 84 mL/min — AB (ref >90)
Glucose, Bld: 237 mg/dL — ABNORMAL HIGH (ref 70–99)
Potassium: 5 mEq/L (ref 3.7–5.3)
SODIUM: 137 meq/L (ref 137–147)

## 2014-07-20 LAB — TROPONIN I: Troponin I: <0.3 ng/mL (ref <0.30)

## 2014-07-20 LAB — GLUCOSE, CAPILLARY
GLUCOSE-CAPILLARY: 150 mg/dL — AB (ref 70–99)
GLUCOSE-CAPILLARY: 217 mg/dL — AB (ref 70–99)
Glucose-Capillary: 200 mg/dL — ABNORMAL HIGH (ref 70–99)
Glucose-Capillary: 305 mg/dL — ABNORMAL HIGH (ref 70–99)
Glucose-Capillary: 196 mg/dL — ABNORMAL HIGH (ref 70–99)
Glucose-Capillary: 206 mg/dL — ABNORMAL HIGH (ref 70–99)
Glucose-Capillary: 188 mg/dL — ABNORMAL HIGH (ref 70–99)
Glucose-Capillary: 138 mg/dL — ABNORMAL HIGH (ref 70–99)

## 2014-07-20 MED ORDER — METOPROLOL TARTRATE 12.5 MG HALF TABLET
12.5000 mg | ORAL_TABLET | Freq: Every day | ORAL | Status: DC
  Filled 2014-07-20 (×2): qty 1

## 2014-07-20 MED ORDER — SODIUM CHLORIDE 0.9 % IV BOLUS (SEPSIS)
500.0000 mL | Freq: Once | INTRAVENOUS | Status: AC
  Administered 2014-07-20: 500 mL via INTRAVENOUS

## 2014-07-20 MED ORDER — SODIUM CHLORIDE 0.9 % IV BOLUS (SEPSIS)
250.0000 mL | Freq: Once | INTRAVENOUS | Status: AC
  Administered 2014-07-20: 250 mL via INTRAVENOUS

## 2014-07-20 MED ORDER — METOPROLOL TARTRATE 25 MG PO TABS
25.0000 mg | ORAL_TABLET | Freq: Every day | ORAL | Status: DC
  Administered 2014-07-21: 25 mg via ORAL
  Filled 2014-07-20 (×2): qty 1

## 2014-07-20 MED ORDER — WARFARIN SODIUM 5 MG PO TABS
5.0000 mg | ORAL_TABLET | Freq: Once | ORAL | Status: AC
  Administered 2014-07-20: 5 mg via ORAL
  Filled 2014-07-20: qty 1

## 2014-07-20 MED ORDER — TRAMADOL HCL 50 MG PO TABS
50.0000 mg | ORAL_TABLET | Freq: Once | ORAL | Status: AC
  Administered 2014-07-20: 50 mg via ORAL
  Filled 2014-07-20: qty 1

## 2014-07-20 MED ORDER — METOPROLOL TARTRATE 12.5 MG HALF TABLET: 12.5000 mg | ORAL_TABLET | Freq: Two times a day (BID) | ORAL | Status: DC

## 2014-07-20 MED ORDER — WARFARIN - PHARMACIST DOSING INPATIENT
Freq: Every day | Status: DC
  Administered 2014-07-20: 18:00:00

## 2014-07-20 MED ORDER — CLOPIDOGREL BISULFATE 75 MG PO TABS
75.0000 mg | ORAL_TABLET | Freq: Every day | ORAL | Status: DC
  Administered 2014-07-20 – 2014-07-21 (×2): 75 mg via ORAL
  Filled 2014-07-20 (×2): qty 1

## 2014-07-20 MED ORDER — WARFARIN VIDEO
Freq: Once | Status: AC
  Administered 2014-07-20: 19:00:00

## 2014-07-20 MED ORDER — PATIENT'S GUIDE TO USING COUMADIN BOOK
Freq: Once | Status: AC
  Administered 2014-07-20: 15:00:00
  Filled 2014-07-20: qty 1

## 2014-07-20 NOTE — Progress Notes (Signed)
Rapid Response and [facility] notified of pt hypotension. Seva Chancy LPN

## 2014-07-20 NOTE — Progress Notes (Signed)
[facility] notified of hypotension new orders received and requested that foley be left to measure strict I&O's Arthor Captain LPN

## 2014-07-20 NOTE — Significant Event (Signed)
Rapid Response Event Note Called by primary RN to see pt for hypotension Overview: Time Called: 0129 Arrival Time: 0131 Event Type: Hypotension  Initial Focused Assessment: Pt's mentation is at baseline.  BP 72/30 manual.  Pt received a total of dilaudid 2mg  IV in PACU 1644-1700, one percocet at 1844, Morphine 2mg  IV at 2300, Lyrica 50mg  po at 2300, and toradol.  Pt's BP was stable while in the OR but pt was also on Neo for a brief time.  BP stable in PACU but began to trend down upon arrival to floor.  Tracing trend of pain medication follows the trend on decreasing BP.  NS 500 cc bolus already given.  Orders rcvd for another NS 500cc bolus.  Interventions: NS 500cc bolus  Event Summary: Name of Physician Notified: Lamar Blinks, NP at [facility]    at          [facility], [name]

## 2014-07-20 NOTE — Op Note (Signed)
NAMEHONG, TIMM                ACCOUNT NO.:  [id number]  MEDICAL RECORD NO.:  [id number]  LOCATION:  [id number]                        FACILITY:  [facility]  PHYSICIAN:  Alta Corning, M.D.   DATE OF BIRTH:  04/02/1946  DATE OF PROCEDURE:  07/19/2014 DATE OF DISCHARGE:                              OPERATIVE REPORT   PREOPERATIVE DIAGNOSIS:  Supracondylar femur fracture above a total knee replacement.  POSTOPERATIVE DIAGNOSIS:  Supracondylar femur fracture above a total knee replacement.  PROCEDURE:  Open reduction and internal fixation of supracondylar femur fracture above the total knee with intramedullary rod with 3 cross screws locked in the distal fragment and 2 screws locked in the proximal fragment.  SURGEON:  Alta Corning, M.D.  ASSISTANT:  Gary Fleet, P.A.  ANESTHESIA:  General.  BRIEF HISTORY:  Ms. Krolikowski is a 68 year old female with history of having had a left total knee done greater than 12 years ago.  She recently had a fall and suffered a periprosthetic fracture.  We evaluated her and felt her to be a candidate for intramedullary rodding, and she was brought to the operating room this procedure.  DESCRIPTION OF PROCEDURE:  The patient was brought to the operating room, and after adequate anesthesia was obtained with general anesthetic, the patient was placed supine on the operating table.  Left leg was prepped and draped in usual sterile fashion.  Following this, the leg was exsanguinated.  Blood pressure tourniquet was inflated 300 mmHg and a sterile tourniquet had been used.  Following this, an incision was made over the patellar tendon at subcutaneous tissue, dissected down to the level of the patellar tendon and a central portion of the patellar tendon was exploited to allow access into the knee.  At this point, a manipulative closed reduction was undertaken and a guidewire was placed through the box portion of the Scorpio knee and an introducer was used  and a guidewire was advanced up the femur.  We sequentially reamed to a level of 15 and 13 mm rod by 32 which was the measured portion, was advanced across the leg.  Anatomic reduction was confirmed, and following this, the screws were placed in the proximal of the 2 distal holes medial to lateral and then in the crisscross holes. We then locked down by the set screw and the rod.  Once that was done, it was clear we are not going to be able to get to most distal that was in line with the PEG.  At that point, the shaver was removed.  The knee was copiously and thoroughly irrigated.  Proximally, we put 2 locking screws freehand into the proximal portion of the rod.  At this point, we put the knee through a range of motion.  Excellent range of motion and stability was achieved.  We ranged it under fluoro and no issues with the range of motion at the knee.  At this point, the wound was again irrigated and suctioned dry.  The patellar tendon was closed with 1 Vicryl interrupted and the paratenon and skin with 2-0 Vicryl and then staples in the skin.  The small poke holes for the crossing screws and proximal  screws were closed with the staple. At this point, the patient was taken to the recovery room and was noted to be in satisfactory condition. Estimated loss for procedure was 300 mL.  Most of this was hematoma which had been contained within the knee.     Alta Corning, M.D.     Corliss Skains  D:  07/19/2014  T:  07/20/2014  Job:  [id number]

## 2014-07-20 NOTE — Progress Notes (Signed)
[facility] was notified concerning pt hypotension and pain management new orders received and monitored. Arthor Captain LPN

## 2014-07-20 NOTE — Clinical Social Work Psychosocial (Signed)
Clinical Social Work Department BRIEF PSYCHOSOCIAL ASSESSMENT 07/20/2014  Patient:  [name], [name]     Account Number:  [id number]     Admit date:  07/18/2014  Clinical Social Worker:  Lovey Newcomer  Date/Time:  07/20/2014 03:00 PM  Referred by:  Physician  Date Referred:  07/20/2014 Referred for  SNF Placement   Other Referral:   NA   Interview type:  Patient Other interview type:   Patient alert and oriented.    PSYCHOSOCIAL DATA Living Status:  FAMILY Admitted from facility:   Level of care:   Primary support name:  Lorriane Shire Primary support relationship to patient:  CHILD, ADULT Degree of support available:   Support is good.    CURRENT CONCERNS Current Concerns  Post-Acute Placement   Other Concerns:   NA    SOCIAL WORK ASSESSMENT / PLAN CSW met with the patient at bedside to complete assessment. Patent states that she lives with her mother, daughter, and grandson, who are all strong supports for her. Patient agrees that she needs SNF placement for short term rehab but she states that she is, "not excited about it." Patient reports that she would like to go to [facility] at discharge and requests a private room. CSW explained SNF search/placement process and answered questions. Patient states she will return home with [place] if [place] offer her a private room.   Assessment/plan status:  Psychosocial Support/Ongoing Assessment of Needs Other assessment/ plan:   Complete Fl2, Fax, PASRR   Information/referral to community resources:   CSW contact information and SNF list given.    PATIENT'S/FAMILY'S RESPONSE TO PLAN OF CARE: Patient was calm and engaged in assessment. Patient plans to DC to SNF when stable. CSW will assist.       Liz Beach MSW, [place], [place], [phone]

## 2014-07-20 NOTE — Clinical Social Work Placement (Addendum)
Clinical Social Work Department CLINICAL SOCIAL WORK PLACEMENT NOTE 07/20/2014  Patient:  [name], [name]  Account Number:  [id number] Admit date:  07/18/2014  Clinical Social Worker:  Lovey Newcomer  Date/time:  07/20/2014 04:25 PM  Clinical Social Work is seeking post-discharge placement for this patient at the following level of care:   SKILLED NURSING   (*CSW will update this form in Epic as items are completed)   07/20/2014  Patient/family provided with [place] Department of Clinical Social Work's list of facilities offering this level of care within the geographic area requested by the patient (or if unable, by the patient's family).  07/20/2014  Patient/family informed of their freedom to choose among providers that offer the needed level of care, that participate in Medicare, Medicaid or managed care program needed by the patient, have an available bed and are willing to accept the patient.  07/20/2014  Patient/family informed of [facility]' ownership interest in [facility], as well as of the fact that they are under no obligation to receive care at this facility.  PASARR submitted to EDS on 07/20/2014 PASARR number received on 07/20/2014  FL2 transmitted to all facilities in geographic area requested by pt/family on  07/20/2014 FL2 transmitted to all facilities within larger geographic area on   Patient informed that his/her managed care company has contracts with or will negotiate with  certain facilities, including the following:     Patient/family informed of bed offers received:  07/20/2014 Patient chooses bed at [facility] Physician recommends and patient chooses bed at    Patient to be transferred to [facility] on   Patient to be transferred to facility by Ambulance Patient and family notified of transfer on 07/21/14 Name of family member notified:  Family at bedside  The following physician request were entered in  Epic:   Additional Comments:  Per MD patient ready for DC to [facility]. RN, patient, patient's family, and facility notified of DC. RN given number for report. DC packet on chart. [facility] transport requested for patient. CSW signing off.   Liz Beach MSW, [place], [place], [phone]

## 2014-07-20 NOTE — Progress Notes (Signed)
Subjective: 1 Day Post-Op Procedure(s) (LRB): INTRAMEDULLARY (IM) NAIL FEMORAL (Left) Patient reports pain as mild. Not out of bed yet.  No complaints of shortness of breath or chest pain.  Objective: Vital signs in last 24 hours: Temp:  [97.8 F (36.6 C)-98.8 F (37.1 C)] 97.8 F (36.6 C) (10/15 0430) Pulse Rate:  [87-120] 96 (10/15 0430) Resp:  [12-25] 16 (10/15 0430) BP: (70-150)/(25-65) 90/50 mmHg (10/15 0616) SpO2:  [93 %-100 %] 96 % (10/15 0430) Weight:  [95.528 kg (210 lb 9.6 oz)] 95.528 kg (210 lb 9.6 oz) (10/15 0430)  Intake/Output from previous day: 10/14 0701 - 10/15 0700 In: 9326 [P.O.:240; I.V.:1000; IV Piggyback:500] Out: 2785 [Urine:2485; Blood:300] Intake/Output this shift:     Recent Labs  07/18/14 1024 07/19/14 0550 07/20/14 0235  HGB 14.4 12.7 10.2*    Recent Labs  07/19/14 0550 07/20/14 0235  WBC 8.1 7.9  RBC 4.59 3.64*  HCT 38.3 30.8*  PLT 111* 97*    Recent Labs  07/19/14 0550 07/20/14 0235  NA 135* 137  K 4.4 5.0  CL 97 101  CO2 22 25  BUN 22 15  CREATININE 1.10 0.79  GLUCOSE 272* 237*  CALCIUM 8.8 7.9*    Recent Labs  07/18/14 1024  INR 0.90   Left leg exam: Knee immobilizer intact.  Calf soft and nontender.  Good ankle range of motion.  Distal pulses 2+.  Normal sensation.  Assessment/Plan: 1 Day Post-Op Procedure(s) (LRB): INTRAMEDULLARY (IM) NAIL FEMORAL (Left) Plan: Up with physical therapy for touchdown weightbearing on left with knee immobilizer. Per Dr. Johnsie Cancel will start low-dose Coumadin for DVT prophylaxis today.also continue on Plavix. She will need skilled nursing facility.   Brieana Shimmin G 07/20/2014, 8:54 AM

## 2014-07-20 NOTE — Progress Notes (Signed)
Patient Name: [name] Date of Encounter: 07/20/2014    Principal Problem:   Fracture, femur, distal Active Problems:   Type II diabetes mellitus   Essential hypertension   COPD (chronic obstructive pulmonary disease) with chronic bronchitis   CAD- OM2 DES 06/20/14   TOBACCO ABUSE   GERD (gastroesophageal reflux disease)   Hyperlipidemia   SUBJECTIVE  S/P ORIF L femur yesterday.  Pain treated in PACU with dilaudid and later with toradol and morphine last PM.  Developed hypotension following 2mg  MSO4 @ 2303 last night in setting of already soft BP (97/36).  Pressure dropped to 70/30 and she required IVF.  In that setting she developed sscp/tightness.  ECG not done.  C/P eventually resolved.  Feels ok this morning.  No chest pain or sob.  Of note, asa/plavix held yesterday morning and not reordered post-op.  CURRENT MEDS . aspirin EC  81 mg Oral Daily  . atorvastatin  40 mg Oral q1800  . Chlorhexidine Gluconate Cloth  6 each Topical Q0600  . cholecalciferol  1,000 Units Oral Daily  . Influenza vac split quadrivalent PF  0.5 mL Intramuscular Tomorrow-1000  . insulin aspart  0-15 Units Subcutaneous TID WC  . insulin aspart  6 Units Subcutaneous TID WC  . insulin detemir  50 Units Subcutaneous QHS  . metoprolol tartrate  12.5 mg Oral QHS  . metoprolol tartrate  25 mg Oral Daily  . mupirocin ointment  1 application Nasal BID  . nicotine  21 mg Transdermal Daily  . pantoprazole  40 mg Oral Daily  . polyethylene glycol  17 g Oral Daily  . polyvinyl alcohol  1 drop Both Eyes BID  . pregabalin  50 mg Oral BID  . senna  1 tablet Oral BID  . vancomycin  1,000 mg Intravenous Q12H    OBJECTIVE  Filed Vitals:   07/20/14 0420 07/20/14 0423 07/20/14 0430 07/20/14 0616  BP: 76/39 72/40 79/36  90/50  Pulse: 90  96   Temp:   97.8 F (36.6 C)   TempSrc:   Oral   Resp:   16   Height:      Weight:   210 lb 9.6 oz (95.528 kg)   SpO2:   96%     Intake/Output Summary (Last 24  hours) at 07/20/14 0923 Last data filed at 07/20/14 0517  Gross per 24 hour  Intake   1740 ml  Output   2585 ml  Net   -845 ml   Filed Weights   07/18/14 0911 07/18/14 1815 07/20/14 0430  Weight: 211 lb (95.709 kg) 211 lb 8 oz (95.936 kg) 210 lb 9.6 oz (95.528 kg)    PHYSICAL EXAM  General: Pleasant, NAD. Neuro: Alert and oriented X 3. Moves all extremities spontaneously. Psych: Normal affect. HEENT:  Normal  Neck: Supple without bruits or JVD. Lungs:  Resp regular and unlabored, CTA. Heart: RRR, tachy, no s3, s4, or murmurs. Abdomen: firm, non-tender, non-distended, BS + x 4.  Extremities: No clubbing, cyanosis or edema. DP/PT/Radials 2+ and equal bilaterally.  Accessory Clinical Findings  CBC  Recent Labs  07/18/14 1024 07/19/14 0550 07/20/14 0235  WBC 9.0 8.1 7.9  NEUTROABS 6.6  --   --   HGB 14.4 12.7 10.2*  HCT 44.0 38.3 30.8*  MCV 82.9 83.4 84.6  PLT 136* 111* 97*   Basic Metabolic Panel  Recent Labs  07/19/14 0550 07/20/14 0235  NA 135* 137  K 4.4 5.0  CL 97 101  CO2  22 25  GLUCOSE 272* 237*  BUN 22 15  CREATININE 1.10 0.79  CALCIUM 8.8 7.9*   Cardiac Enzymes  Recent Labs  07/19/14 0550 07/19/14 1158  TROPONINI <0.30 <0.30   TELE  rsr 90's to ST low 100's.  ECG  Pending   ASSESSMENT AND PLAN  1.  L Femur Fx: S/P ORIF yesterday.  Developed hypotension in setting of pain mgmt.  H/o labile HTN @ home.  Will have to follow closely and likely hold BB.  Low dose coumadin planned for DVT prophylaxis.  2.  [place]/CAD:  Pt developed c/p in setting of the hypotension last night.  Will obtain ECG and troponin this morning as this occurred while asa/plavix on hold.  Will try to cont bb as BP allows.  Resume plavix.  Cont statin.  3.  Labile HTN/hypotn:  hypotn more pronounced in the setting of pain mgmt.  Pain better this AM.  Strict hold parameters for BB therapy.  4.  HL:  Cont statin.  LDL 86 in Sept.  5.  Normocytic Anemia/thrombocytopenia:   H/H drifting down post-op.  Follow.  Plts 97K this morning. Follow on plavix.  Signed, Murray Hodgkins NP  Patient examined chart reviewed. No pain and feels fine now.  Discussed anticoagulation issues with Dr Berenice Primas Last night.  Only missed one dose of plavix yesterday last dose Tuesday 10:00pm  Resumed  Coumadin for DVT prophylaxis  Ok to hold asa for a month if needed while on coumadin  Check P2Y to make sure plavix working  Discussed with patient and daughters  Jenkins Rouge

## 2014-07-20 NOTE — Evaluation (Signed)
Physical Therapy Evaluation Patient Details Name: [name] MRN: [id number] DOB: [birth year] Today's Date: 07/20/2014   History of Present Illness  Patient is a 68 y/o female s/p ORIF Left femur 10/14 secondary to fall and left periprosthetic distal femur fx. Developed hypotension last night in setting of already soft BP (97/36).  Pressure dropped to 70/30 and she required IVF.  In that setting pt developed chest pain. Rapid response called. PMH of asthma, CAD, DM, HLD, COPD, HTN and lung ca.     Clinical Impression  Patient presents with functional limitations due to deficits listed in PT problem list (see below). Pt limited with mobility secondary to surgery and WB restrictions (TDWB LLE). Highly motivated to participate in therapy. Tolerated standing and short distance ambulation today with Min A for balance and safety. BP appropriately increased with activity however longer rest breaks required due to elevated HR with minimal activity. Reports of light headedness noted with changes in position that resolved with time. Compliant with WB status. Pt would benefit from skilled PT and ST SNF to improve transfers, gait and overall safe mobility so pt can maximize independence and return to PLOF.    Follow Up Recommendations SNF;Supervision/Assistance - 24 hour    Equipment Recommendations  None recommended by PT    Recommendations for Other Services OT consult     Precautions / Restrictions Precautions Precautions: Fall Precaution Comments: soft BP. Monitor. Required Braces or Orthoses: Knee Immobilizer - Left Knee Immobilizer - Left: Other (comment) (not clearly stated.) Restrictions Weight Bearing Restrictions: Yes LLE Weight Bearing: Touchdown weight bearing      Mobility  Bed Mobility Overal bed mobility: Needs Assistance Bed Mobility: Supine to Sit     Supine to sit: Mod assist;HOB elevated     General bed mobility comments: Use of rails. VC's for technique and assist  with trunk and mobilizing LLE to EOB.  Transfers Overall transfer level: Needs assistance Equipment used: Rolling walker (2 wheeled) Transfers: Sit to/from Stand Sit to Stand: Mod assist         General transfer comment: Mod A to rise and lift hips and for steadying. VC's for technique and hand placement as pt TD WB LLE. Mild lightheadedness upon standing.  Ambulation/Gait Ambulation/Gait assistance: Min assist Ambulation Distance (Feet): 4 Feet Assistive device: Rolling walker (2 wheeled) Gait Pattern/deviations: Step-to pattern;Trunk flexed;Decreased stance time - left;Decreased step length - right;Decreased weight shift to left;Shuffle Gait velocity: decreased   General Gait Details: VC for upright posture, RW management and for increased WB through BUEs to decrease WB through LLE during stance phase. Compliant with TDWB with Min A for balance.   Stairs            Wheelchair Mobility    Modified Rankin (Stroke Patients Only)       Balance Overall balance assessment: Needs assistance   Sitting balance-Leahy Scale: Good Sitting balance - Comments: Able to weightshift without LOB.      Standing balance-Leahy Scale: Poor Standing balance comment: Requires UE support on RW during static and dynamic standing due to weakness and WB restrictions.                             Pertinent Vitals/Pain Pain Assessment: No/denies pain    Home Living Family/patient expects to be discharged to:: Skilled nursing facility Living Arrangements: Children;Parent;Other relatives               Additional Comments: Pt  lives with mother, daughter and grandchildren. Ramp to enter.    Prior Function           Comments: Pt reports (I) with ADLs but daughter performs IADLs.      Hand Dominance   Dominant Hand: Right    Extremity/Trunk Assessment   Upper Extremity Assessment: Overall WFL for tasks assessed           Lower Extremity Assessment:  Generalized weakness;LLE deficits/detail;RLE deficits/detail RLE Deficits / Details: Impaired light touch sensation distal to knee and into foot - premorbid. Strength WFL. LLE Deficits / Details: Ankle AROM WFL. Other joints not assessed due to [place].     Communication      Cognition Arousal/Alertness: Awake/alert Behavior During Therapy: WFL for tasks assessed/performed Overall Cognitive Status: Within Functional Limits for tasks assessed                      General Comments General comments (skin integrity, edema, etc.): Surgical site not visible due to KI. Foley catheter donned. Pt on 3L 02 [place] (on at home during night and PRN during the day). Vitals in supine BP:94/46, HR 106 bpm Sa02 95% on 3L 02 [place]. Sitting BP 108/45 HR 121 bpm., Sa02 95%. Post transfer BP: 117/58 HR120 bpm Sa02 97% on 3L 02 [place].    Exercises        Assessment/Plan    PT Assessment Patient needs continued PT services  PT Diagnosis Abnormality of gait;Generalized weakness   PT Problem List Decreased strength;Decreased skin integrity;Decreased mobility;Decreased balance;Decreased activity tolerance;Impaired sensation;Decreased range of motion;Cardiopulmonary status limiting activity  PT Treatment Interventions Balance training;Gait training;Patient/family education;Functional mobility training;Therapeutic activities;Therapeutic exercise   PT Goals (Current goals can be found in the Care Plan section) Acute Rehab PT Goals Patient Stated Goal: to get moving better PT Goal Formulation: With patient Time For Goal Achievement: 08/03/14 Potential to Achieve Goals: Good    Frequency Min 2X/week   Barriers to discharge        Co-evaluation               End of Session Equipment Utilized During Treatment: Gait belt;Oxygen;Left knee immobilizer Activity Tolerance: Patient tolerated treatment well Patient left: in chair;with call bell/phone within reach;with family/visitor present;with nursing/sitter in  room Nurse Communication: Mobility status;Precautions;Weight bearing status         Time: 5974-1638 PT Time Calculation (min): 33 min   Charges:   PT Evaluation $Initial PT Evaluation Tier I: 1 Procedure PT Treatments $Therapeutic Activity: 8-22 mins   PT G CodesCandy Sledge A 07/20/2014, 12:04 PM Candy Sledge, [place], DPT [phone]

## 2014-07-20 NOTE — Progress Notes (Signed)
[facility] PROGRESS NOTE   [name] FBP:[id number] DOB: Oct 22, 1945 DOA: 07/18/2014 PCP: [name], MD  HPI/Subjective: Denies any complaints this morning. Still continues to have low blood pressure. Her night dose of Levemir was given earlier today morning. Hold or give low dose tonight.  Assessment/Plan: Principal Problem:   Fracture, femur, distal Active Problems:   Type II diabetes mellitus   TOBACCO ABUSE   Essential hypertension   COPD (chronic obstructive pulmonary disease) with chronic bronchitis   CAD- OM2 DES 06/20/14   GERD (gastroesophageal reflux disease)   Hyperlipidemia    Left femoral fracture -Left femoral periprosthetic fracture. Orthopedics consulted. -Plans for intramedullary fixation today. -Patient is on heparin subcutaneously for DVT prophylaxis held this morning restart postsurgically -Control pain with narcotics.  Hypotension -Patient has history of hypertension she is on lisinopril and metoprolol, currently both on hold because of low blood pressure -Boluses of IV fluids if needed. Likely low blood pressure is secondary to pain medications.  CAD -Patient complained this morning about chest pain which is resolved now. -Has history of recent stent to the circumflex placed on 06/20/14. -Per cardiac catheterization at that time no other significant stenotic lesions.  Type 2 diabetes mellitus -Uncontrolled type 2 diabetes mellitus, hemoglobin A1c was 8.9 on 06/19/14. -Continued her on dose of insulin, we'll increase the Levemir does tonight. -Levemir does increase to 50, patient got only 4 units last night, 50 units was given Prevacid day instead of at night. -Check CBG every 2 hours, if patient blood sugar holding up she'll have low dose of Levemir if not hold Levemir tonight.  History of lung cancer -Outpatient followup, reported to be in remission.  GERD -Continue PPI  Code Status: Full code Family Communication: Plan  discussed with the patient. Disposition Plan: Remains inpatient   Consultants:  Cardiology.  Orthopedics  Procedures:    Antibiotics:  Perioperative Ancef.   Objective: Filed Vitals:   07/20/14 1017  BP: 91/37  Pulse: 103  Temp: 98.2 F (36.8 C)  Resp: 16    Intake/Output Summary (Last 24 hours) at 07/20/14 1216 Last data filed at 07/20/14 1020  Gross per 24 hour  Intake   1598 ml  Output   2885 ml  Net  -1287 ml   Filed Weights   07/18/14 0911 07/18/14 1815 07/20/14 0430  Weight: 95.709 kg (211 lb) 95.936 kg (211 lb 8 oz) 95.528 kg (210 lb 9.6 oz)    Exam: General: Alert and awake, oriented x3, not in any acute distress. HEENT: anicteric sclera, pupils reactive to light and accommodation, EOMI CVS: S1-S2 clear, no murmur rubs or gallops Chest: clear to auscultation bilaterally, no wheezing, rales or rhonchi Abdomen: soft nontender, nondistended, normal bowel sounds, no organomegaly Extremities: no cyanosis, clubbing or edema noted bilaterally Neuro: Cranial nerves II-XII intact, no focal neurological deficits  Data Reviewed: Basic Metabolic Panel:  Recent Labs Lab 07/18/14 1024 07/18/14 1234 07/19/14 0550 07/20/14 0235  NA 136*  --  135* 137  K 5.9* 4.7 4.4 5.0  CL 97  --  97 101  CO2 25  --  22 25  GLUCOSE 317*  --  272* 237*  BUN 19  --  22 15  CREATININE 0.80  --  1.10 0.79  CALCIUM 10.0  --  8.8 7.9*   Liver Function Tests: No results found for this basename: AST, ALT, ALKPHOS, BILITOT, PROT, ALBUMIN,  in the last 168 hours No results found for this basename: LIPASE, AMYLASE,  in  the last 168 hours No results found for this basename: AMMONIA,  in the last 168 hours CBC:  Recent Labs Lab 07/18/14 1024 07/19/14 0550 07/20/14 0235  WBC 9.0 8.1 7.9  NEUTROABS 6.6  --   --   HGB 14.4 12.7 10.2*  HCT 44.0 38.3 30.8*  MCV 82.9 83.4 84.6  PLT 136* 111* 97*   Cardiac Enzymes:  Recent Labs Lab 07/19/14 0550 07/19/14 1158    TROPONINI <0.30 <0.30   BNP (last 3 results)  Recent Labs  06/16/14 1603  PROBNP 142.5*   CBG:  Recent Labs Lab 07/19/14 1614 07/19/14 1829 07/19/14 2220 07/20/14 0817 07/20/14 1200  GLUCAP 273* 236* 244* 200* 305*    Micro Recent Results (from the past 240 hour(s))  SURGICAL PCR SCREEN     Status: Abnormal   Collection Time    07/18/14  6:36 PM      Result Value Ref Range Status   MRSA, PCR POSITIVE (*) NEGATIVE Final   Staphylococcus aureus POSITIVE (*) NEGATIVE Final   Comment:            The Xpert SA Assay (FDA     approved for NASAL specimens     in patients over 52 years of age),     is one component of     a comprehensive surveillance     program.  Test performance has     been validated by [employer] for patients greater     than or equal to 63 year old.     It is not intended     to diagnose infection nor to     guide or monitor treatment.     Studies: Dg Femur Left  07/19/2014   CLINICAL DATA:  ORIF of distal left femoral fracture.  EXAM: DG C-ARM 61-120 MIN; LEFT FEMUR - 2 VIEW  COMPARISON:  Radiographs on 07/18/2014  FINDINGS: Intraoperative imaging obtained with a C-arm demonstrates placement of an intramedullary rod in the femur with additional transverse fixation screws. Alignment is improved with minimal displacement at the level of fracture.  IMPRESSION: Improved alignment following ORIF of the left femur.   Electronically Signed   By: Aletta Edouard M.D.   On: 07/19/2014 16:07   Dg C-arm 1-60 Min  07/19/2014   CLINICAL DATA:  ORIF of distal left femoral fracture.  EXAM: DG C-ARM 61-120 MIN; LEFT FEMUR - 2 VIEW  COMPARISON:  Radiographs on 07/18/2014  FINDINGS: Intraoperative imaging obtained with a C-arm demonstrates placement of an intramedullary rod in the femur with additional transverse fixation screws. Alignment is improved with minimal displacement at the level of fracture.  IMPRESSION: Improved alignment following ORIF of the left  femur.   Electronically Signed   By: Aletta Edouard M.D.   On: 07/19/2014 16:07    Scheduled Meds: . aspirin EC  81 mg Oral Daily  . atorvastatin  40 mg Oral q1800  . Chlorhexidine Gluconate Cloth  6 each Topical Q0600  . cholecalciferol  1,000 Units Oral Daily  . clopidogrel  75 mg Oral Daily  . Influenza vac split quadrivalent PF  0.5 mL Intramuscular Tomorrow-1000  . insulin aspart  0-15 Units Subcutaneous TID WC  . insulin aspart  6 Units Subcutaneous TID WC  . insulin detemir  50 Units Subcutaneous QHS  . metoprolol tartrate  12.5 mg Oral QHS  . metoprolol tartrate  25 mg Oral Daily  . mupirocin ointment  1 application Nasal BID  .  nicotine  21 mg Transdermal Daily  . pantoprazole  40 mg Oral Daily  . patient's guide to using coumadin book   Does not apply Once  . polyethylene glycol  17 g Oral Daily  . polyvinyl alcohol  1 drop Both Eyes BID  . pregabalin  50 mg Oral BID  . senna  1 tablet Oral BID  . warfarin  5 mg Oral ONCE-1800  . warfarin   Does not apply Once  . Warfarin - Pharmacist Dosing Inpatient   Does not apply q1800   Continuous Infusions:      Time spent: 35 minutes    [facility] A  [facility] Pager [phone] If 7PM-7AM, please contact night-coverage at [email or web address], password [facility] 07/20/2014, 12:16 PM  LOS: 2 days

## 2014-07-20 NOTE — Progress Notes (Signed)
ANTICOAGULATION CONSULT NOTE - Initial Consult  Pharmacy Consult for Coumadin Indication: VTE prophylaxis  Allergies  Allergen Reactions  . Celebrex [Celecoxib] Palpitations and Other (See Comments)    Chest pain, tachycardia, diaphoresis  . Niacin Rash  . Varenicline Tartrate Rash    CHANTIX    Patient Measurements: Height: 5\' 2"  (157.5 cm) Weight: 210 lb 9.6 oz (95.528 kg) IBW/kg (Calculated) : 50.1  Vital Signs: Temp: 98.2 F (36.8 C) (10/15 1017) Temp Source: Oral (10/15 1017) BP: 91/37 mmHg (10/15 1017) Pulse Rate: 103 (10/15 1017)  Labs:  Recent Labs  07/18/14 1024 07/19/14 0550 07/19/14 1158 07/20/14 0235  HGB 14.4 12.7  --  10.2*  HCT 44.0 38.3  --  30.8*  PLT 136* 111*  --  97*  LABPROT 12.2  --   --   --   INR 0.90  --   --   --   CREATININE 0.80 1.10  --  0.79  TROPONINI  --  <0.30 <0.30  --     Estimated Creatinine Clearance: 72.6 ml/min (by C-G formula based on Cr of 0.79).   Medical History: Past Medical History  Diagnosis Date  . Asthma   . Cholelithiasis   . Colon polyps   . Coronary artery disease     a. 2005 Cath/PCI: mRCA-> [place] DES;  b. 06/2014 Cath/PCI: EF 60%. LM nl, LAD min irregs, D1 40, LCX 20p, OM2 95 (2.5x14 Resolute DES), RCA 30p, 30 ISR, 20d, PDA/PLA nl.  . Type II diabetes mellitus   . GERD (gastroesophageal reflux disease)   . Hyperlipidemia   . Tobacco abuse   . Arthritis   . COPD (chronic obstructive pulmonary disease)     USES O2 AT NIGHT + PRN IN DAYTIME3L  . Emphysema   . H/O hiatal hernia   . Hypertension   . Fibromyalgia   . Lung cancer 2010    Adenocarcinoma, right lung, node positive  . On home oxygen therapy     "2-4L at night and prn" (07/18/2104)  . Chronic bronchitis     Assessment: 68 yo F s/p IM nail for L femur fx. Pharmacy consulted to start coumadin for VTE prophylaxis, with lower goal (1.5-2 per ortho request). Her baseline INR is 0.9, hgb 10.2, plt 97K down from 136 on 10/13.   Goal of  Therapy:  INR 1.5-2.0 Monitor platelets by anticoagulation protocol: Yes   Plan:  - Coumadin 5mg  po x 1 - Daily PT/INR - Watch pltc closely and monitor s/sx of bleeding.  Maryanna Shape, PharmD, BCPS  Clinical Pharmacist  Pager: [phone]   07/20/2014,10:30 AM

## 2014-07-20 NOTE — Plan of Care (Signed)
Problem: Phase I Progression Outcomes Goal: Initial discharge plan identified Outcome: Completed/Met Date Met:  07/20/14 PT recommends ST SNF placement

## 2014-07-21 ENCOUNTER — Inpatient Hospital Stay
Admission: RE | Admit: 2014-07-21 | Discharge: 2014-08-03 | Disposition: A | Discharge status: Home / Self Care | Payer: Medicare Other | Source: Ambulatory Visit | Attending: Internal Medicine | Admitting: Internal Medicine

## 2014-07-21 LAB — CBC
HEMATOCRIT: 30.3 % — AB (ref 36.0–46.0)
Hemoglobin: 9.8 g/dL — ABNORMAL LOW (ref 12.0–15.0)
MCH: 27.8 pg (ref 26.0–34.0)
MCHC: 32.3 g/dL (ref 30.0–36.0)
MCV: 85.8 fL (ref 78.0–100.0)
Platelets: 96 10*3/uL — ABNORMAL LOW (ref 150–400)
RBC: 3.53 MIL/uL — AB (ref 3.87–5.11)
RDW: 15.1 % (ref 11.5–15.5)
WBC: 6.7 10*3/uL (ref 4.0–10.5)

## 2014-07-21 LAB — BASIC METABOLIC PANEL
Anion gap: 8 (ref 5–15)
BUN: 9 mg/dL (ref 6–23)
CO2: 27 mEq/L (ref 19–32)
CREATININE: 0.78 mg/dL (ref 0.50–1.10)
Calcium: 8.3 mg/dL — ABNORMAL LOW (ref 8.4–10.5)
Chloride: 101 mEq/L (ref 96–112)
GFR calc Af Amer: >90 mL/min (ref >90)
GFR calc non Af Amer: 84 mL/min — ABNORMAL LOW (ref >90)
GLUCOSE: 175 mg/dL — AB (ref 70–99)
POTASSIUM: 4.8 meq/L (ref 3.7–5.3)
Sodium: 136 mEq/L — ABNORMAL LOW (ref 137–147)

## 2014-07-21 LAB — GLUCOSE, CAPILLARY
GLUCOSE-CAPILLARY: 170 mg/dL — AB (ref 70–99)
Glucose-Capillary: 169 mg/dL — ABNORMAL HIGH (ref 70–99)
Glucose-Capillary: 204 mg/dL — ABNORMAL HIGH (ref 70–99)
Glucose-Capillary: 204 mg/dL — ABNORMAL HIGH (ref 70–99)

## 2014-07-21 LAB — PLATELET INHIBITION P2Y12: PLATELET FUNCTION P2Y12: 208 [PRU] (ref 194–418)

## 2014-07-21 LAB — PROTIME-INR
INR: 1.13 (ref 0.00–1.49)
Prothrombin Time: 14.6 seconds (ref 11.6–15.2)

## 2014-07-21 IMAGING — CR DG ELBOW 2V*L*
2 series · 2 of 2 positions shown · non-contrast
Comparison: None

CLINICAL DATA: Pain.  Fall.

LEFT ELBOW - 2 VIEW

[view not recorded (1 of 2)]
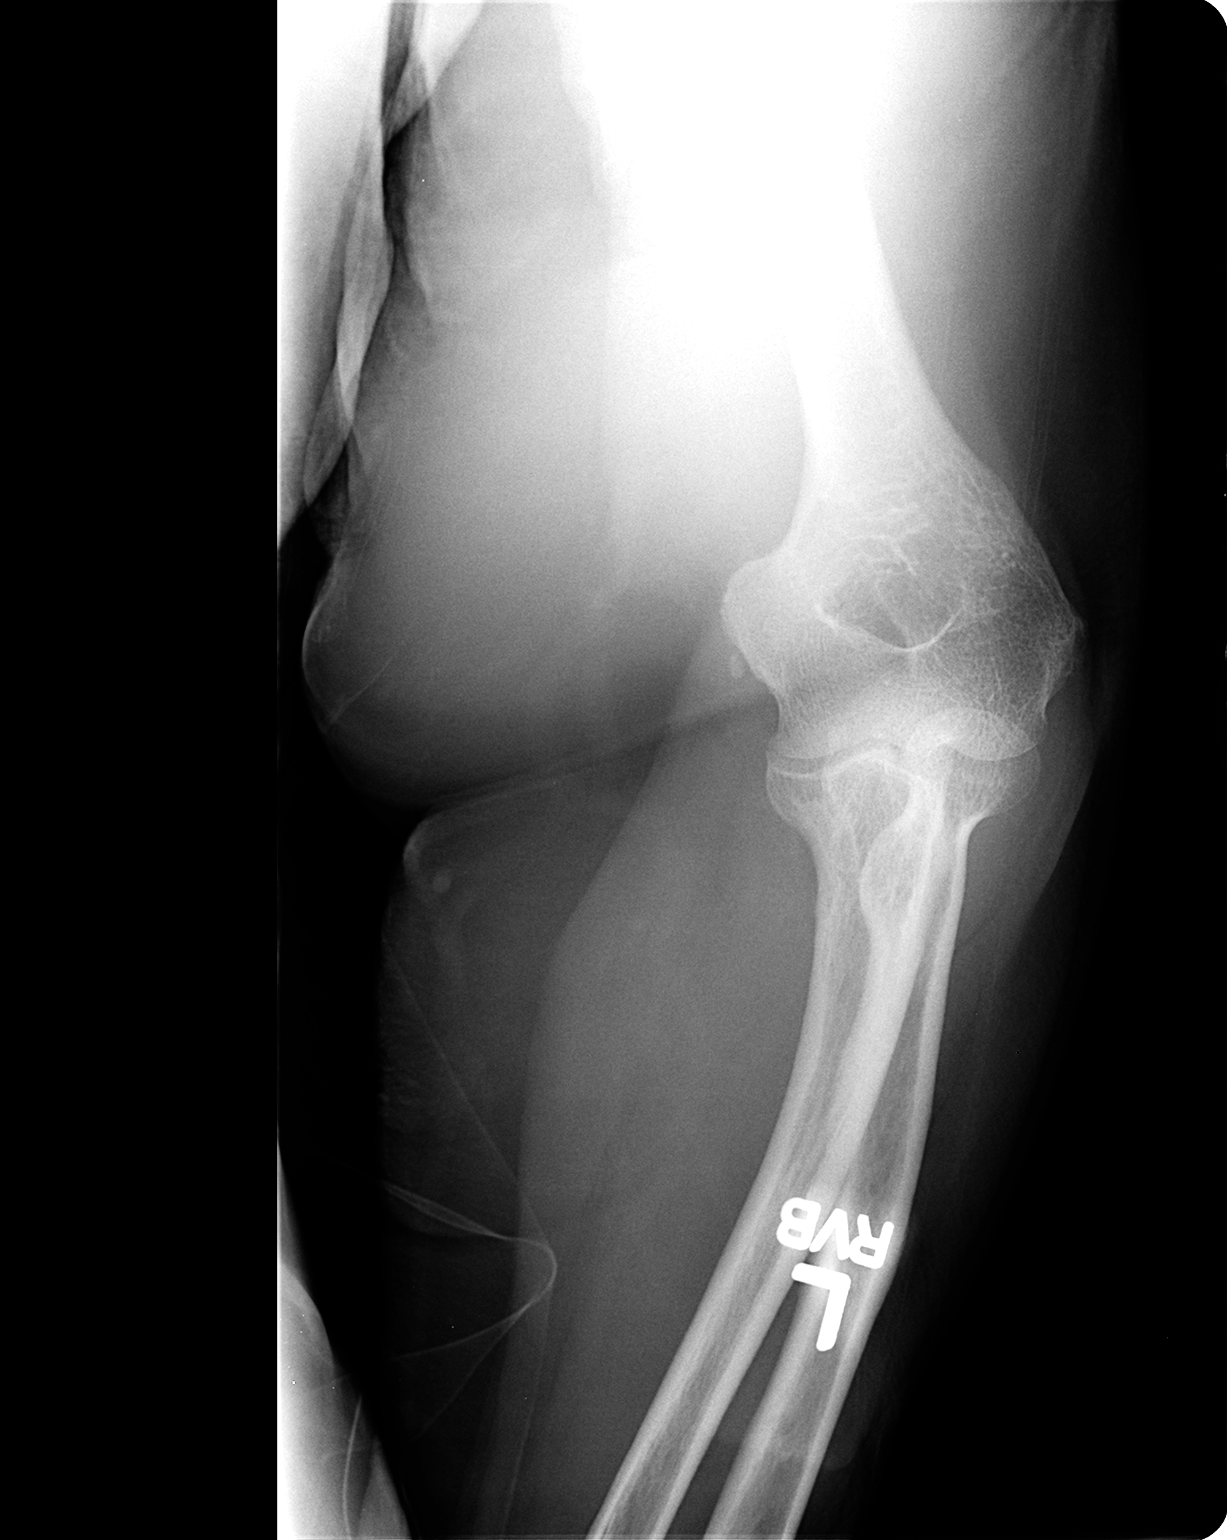

[view not recorded (2 of 2)]
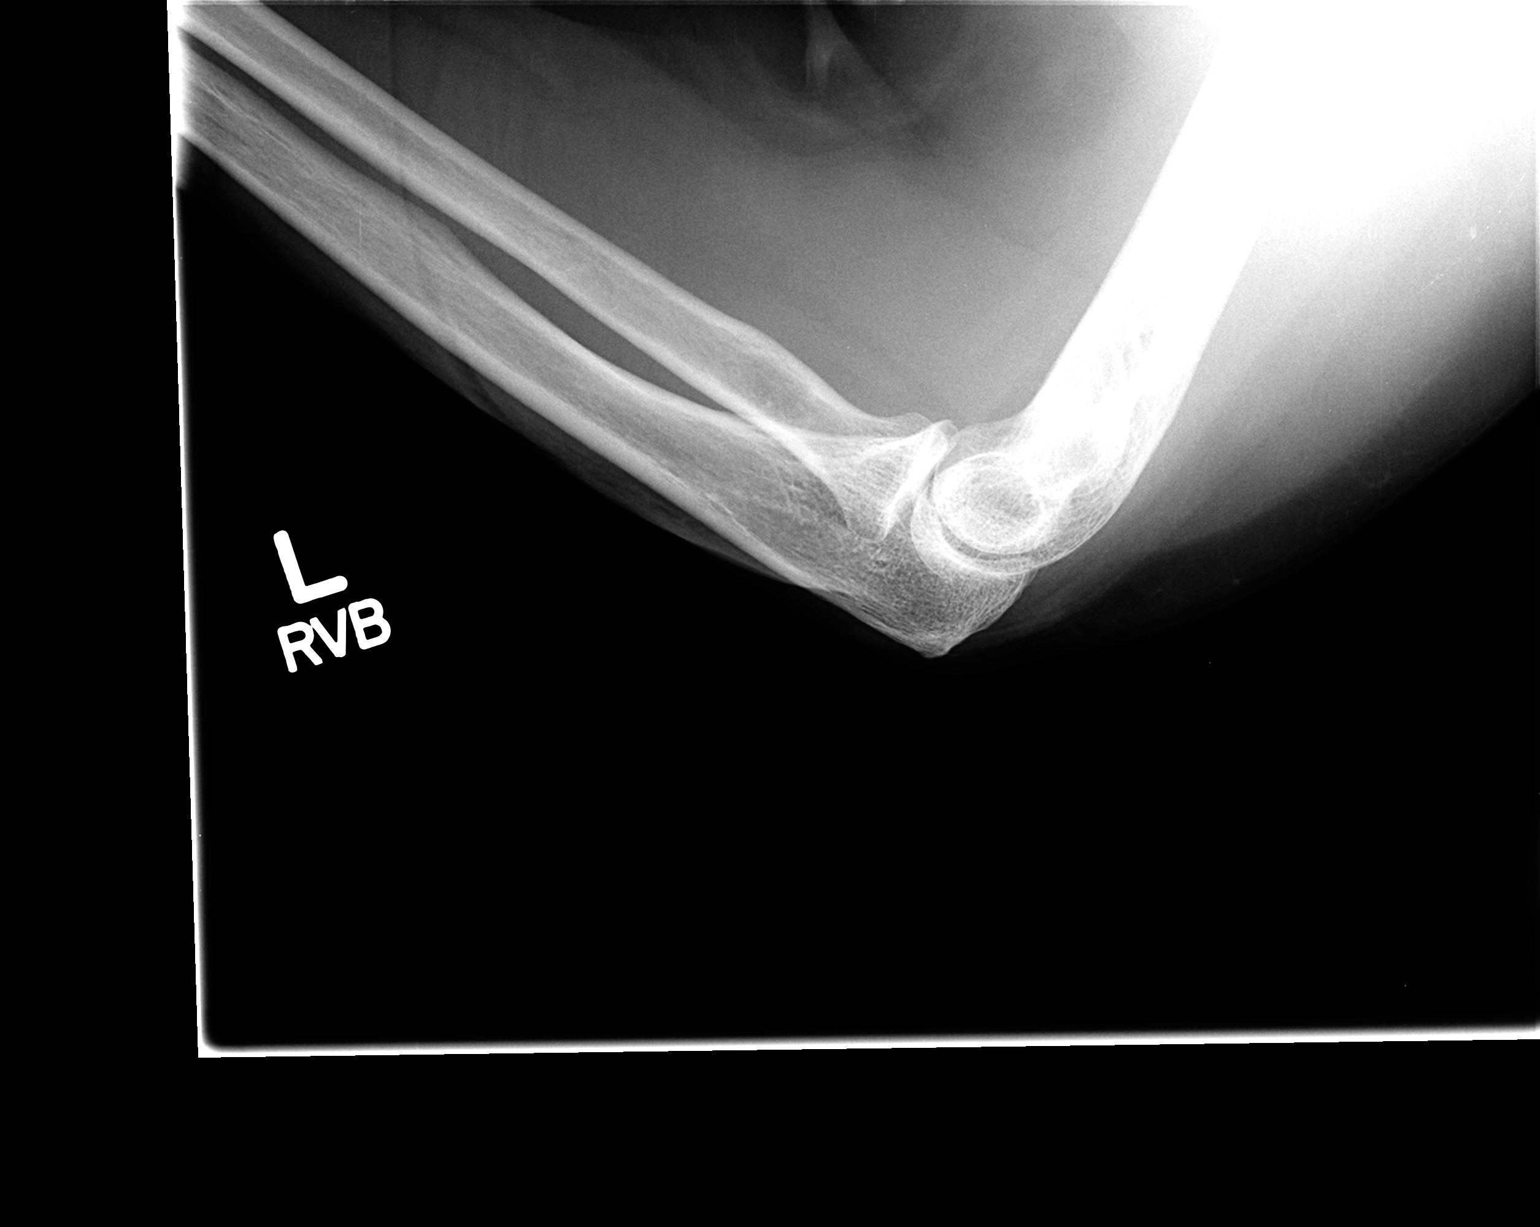

[2 of 2 positions shown; findings below may reference images not displayed]

FINDINGS: Two-view exam with atypical positioning does not show
any evidence of fracture or dislocation.
IMPRESSION: Negative atypically positioned two-view exam

## 2014-07-21 IMAGING — CR DG SHOULDER 2+V*L*
3 series · 3 of 3 positions shown · non-contrast
Comparison: None.

CLINICAL DATA: Severe left shoulder pain following a fall today.

LEFT SHOULDER - 2+ VIEW

[view not recorded (1 of 3)]
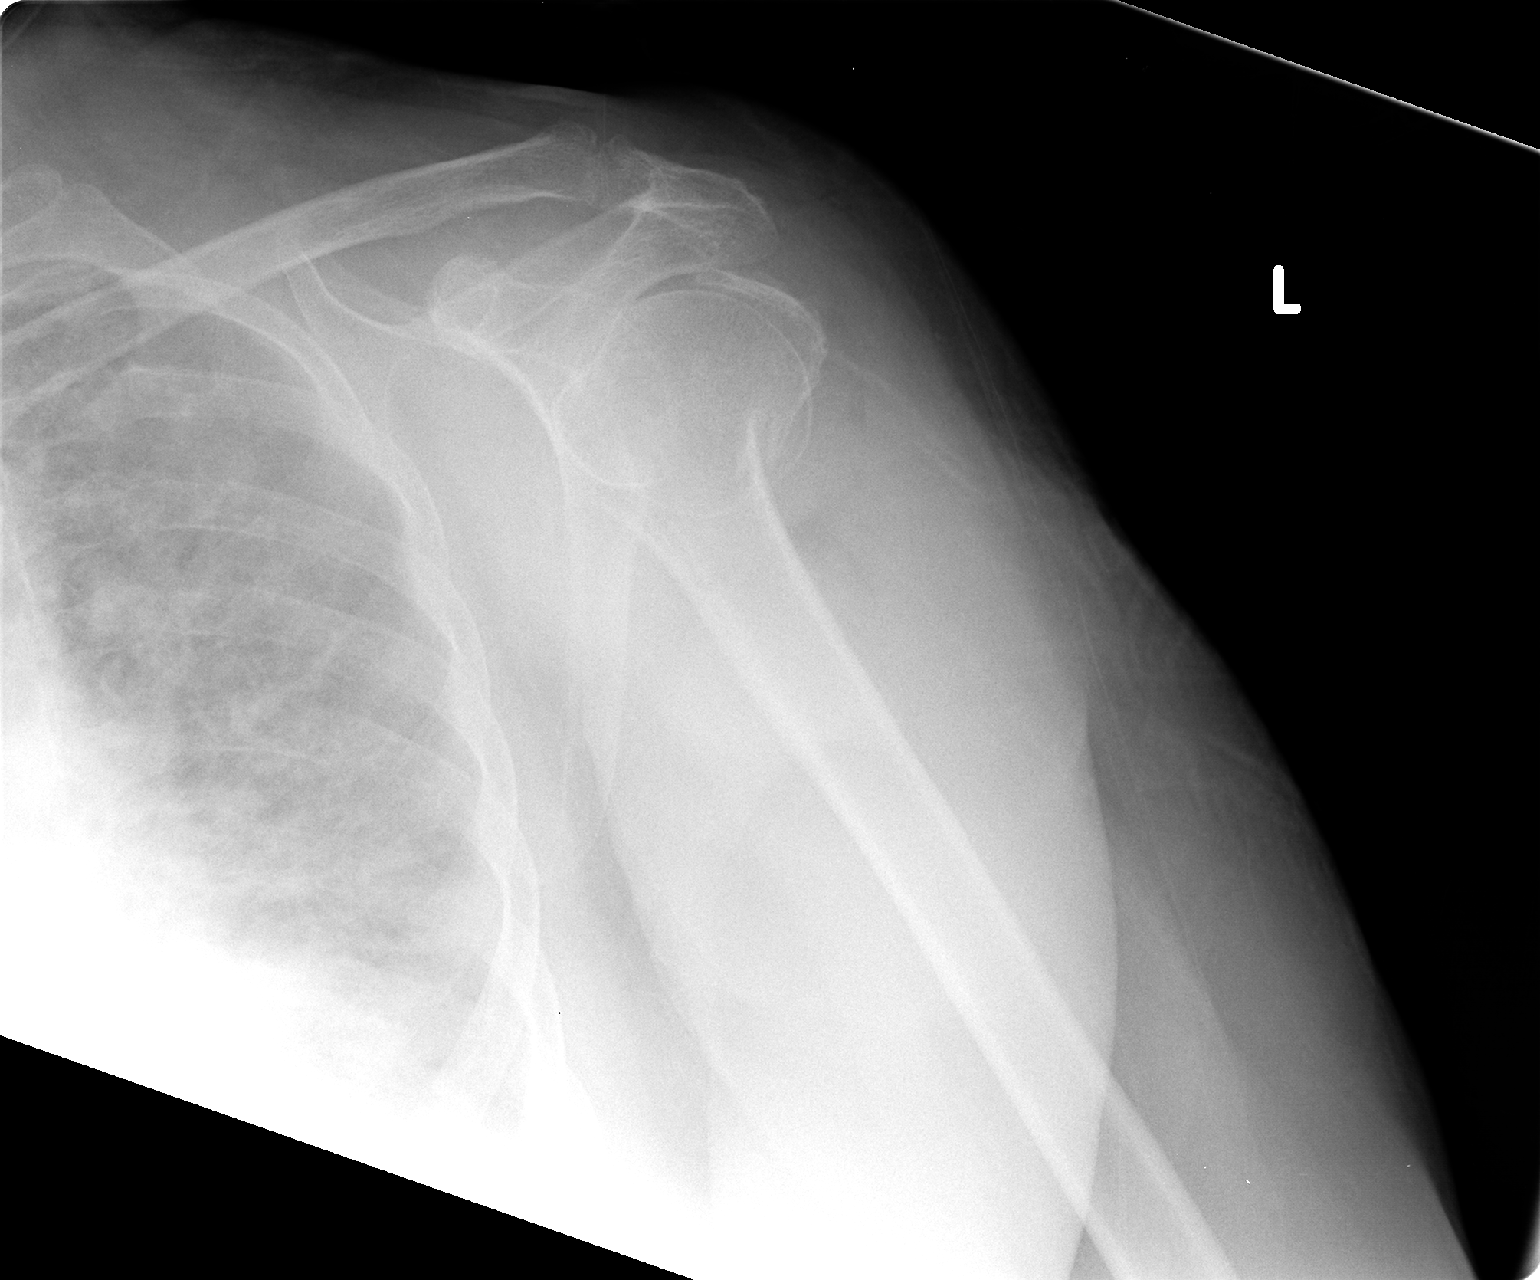

[view not recorded (2 of 3)]
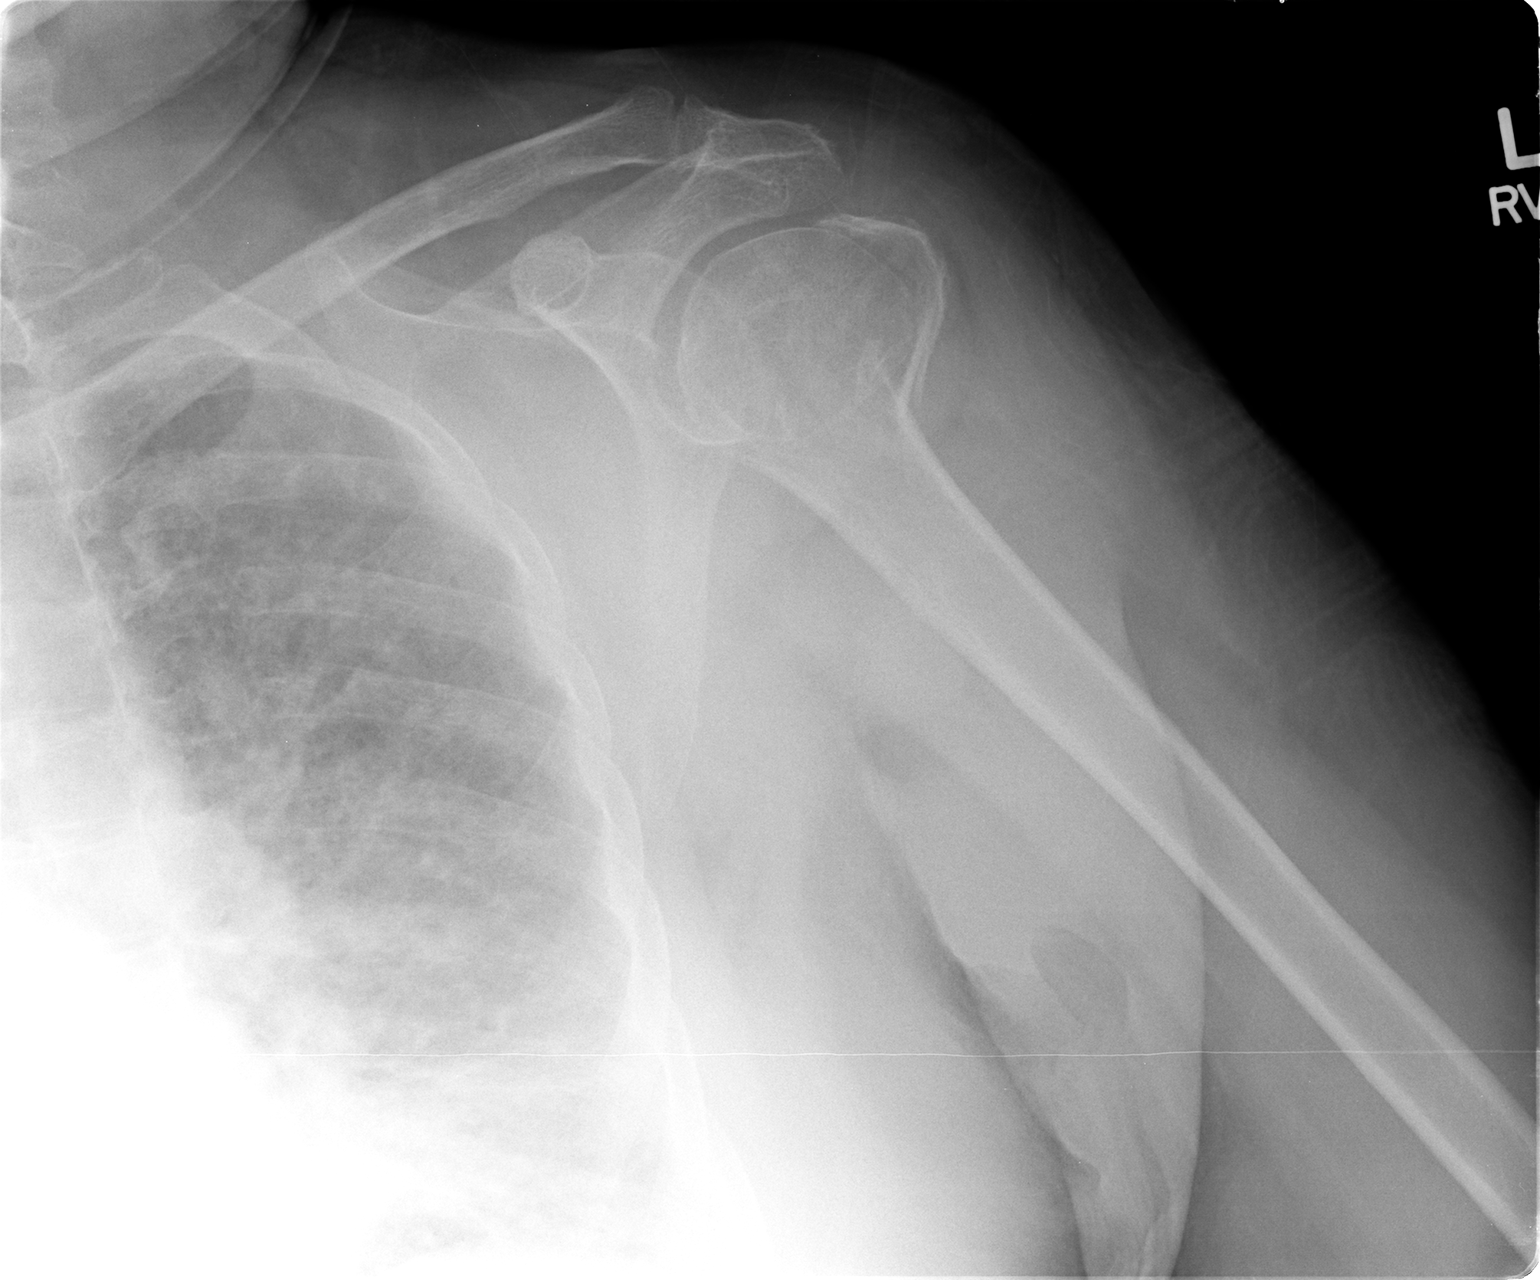

[view not recorded (3 of 3)]
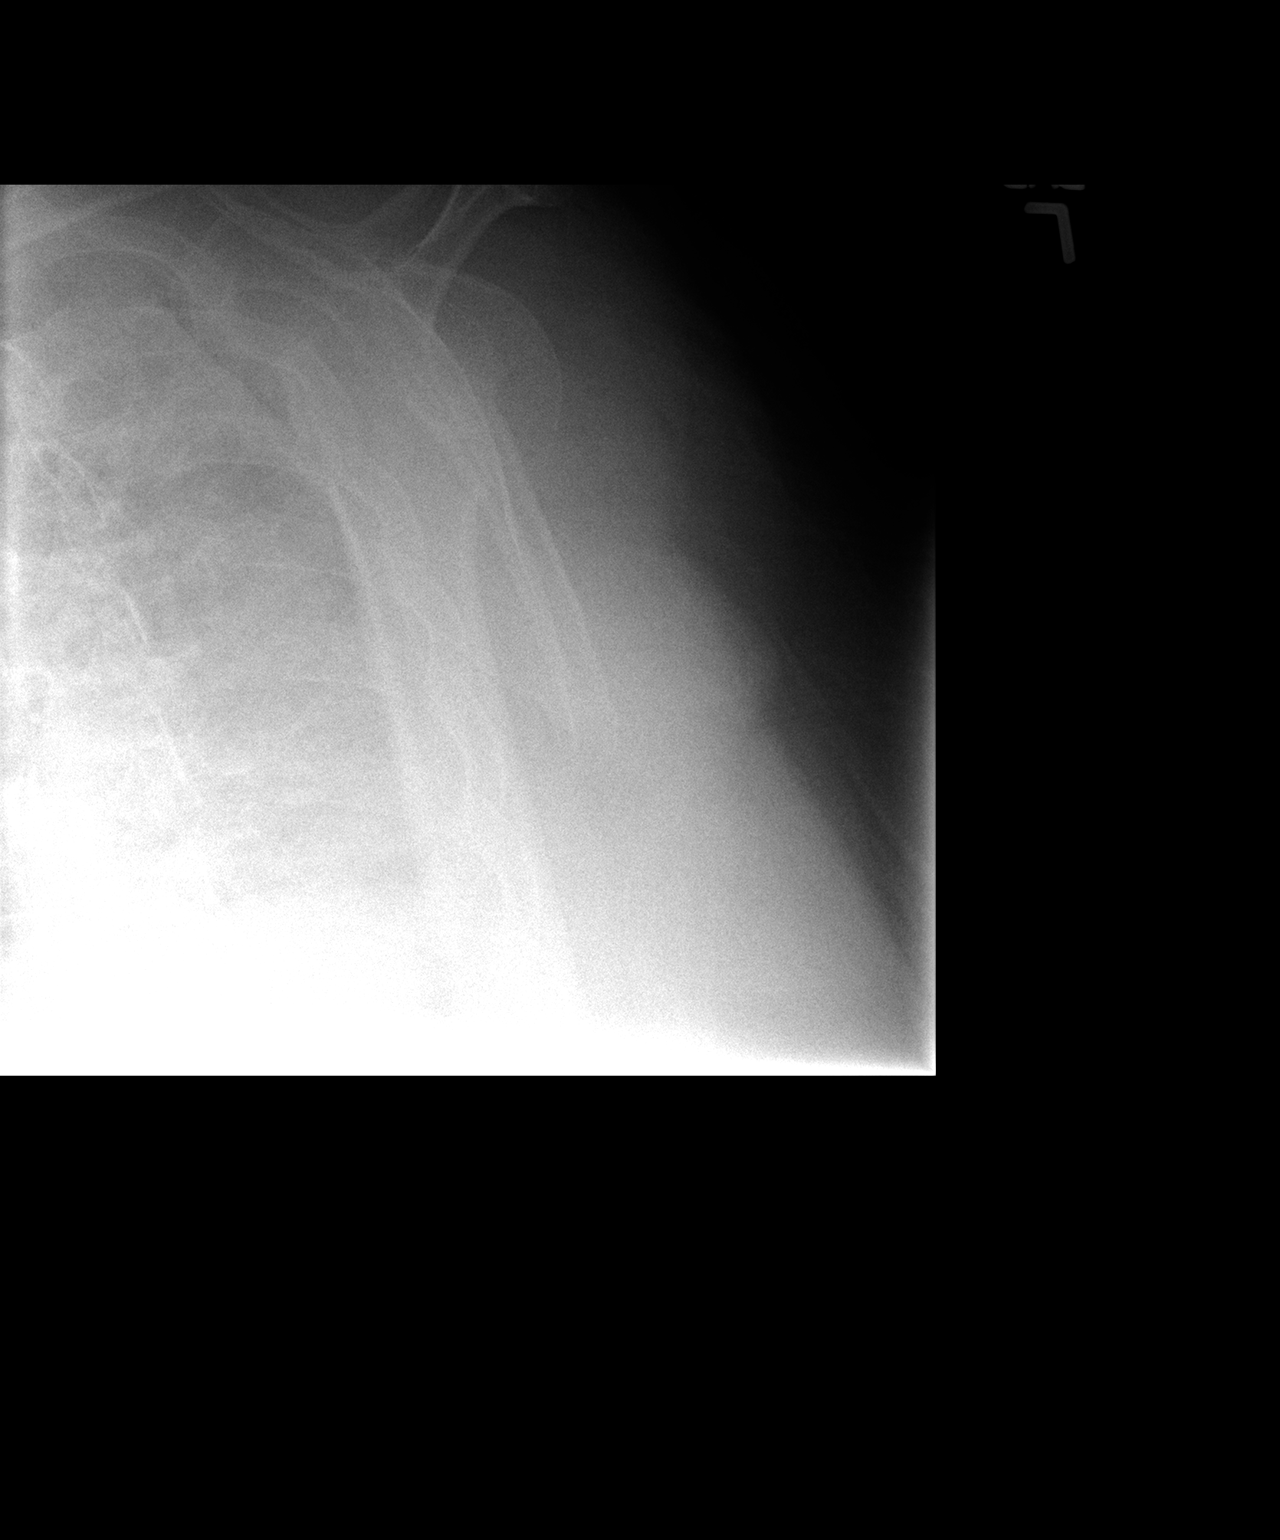

[3 of 3 positions shown; findings below may reference images not displayed]

FINDINGS: Mildly impacted and comminuted left humeral neck and
greater tuberosity fracture.  Mild anterior/medial displacement and
lateral/posterior angulation of the distal fragment.
IMPRESSION: Comminuted humeral neck and greater tuberosity fracture, as
described above.

## 2014-07-21 MED ORDER — INSULIN DETEMIR 100 UNIT/ML ~~LOC~~ SOLN
40.0000 [IU] | Freq: Two times a day (BID) | SUBCUTANEOUS | Status: DC
  Administered 2014-07-21: 40 [IU] via SUBCUTANEOUS
  Filled 2014-07-21 (×2): qty 0.4

## 2014-07-21 MED ORDER — WARFARIN SODIUM 5 MG PO TABS
5.0000 mg | ORAL_TABLET | Freq: Once | ORAL | Status: DC
  Filled 2014-07-21: qty 1

## 2014-07-21 MED ORDER — INSULIN DETEMIR 100 UNIT/ML FLEXPEN: 40.0000 [IU] | PEN_INJECTOR | Freq: Two times a day (BID) | SUBCUTANEOUS | Status: DC

## 2014-07-21 MED ORDER — PATIENT'S GUIDE TO USING COUMADIN BOOK
Freq: Once | Status: DC
  Filled 2014-07-21: qty 1

## 2014-07-21 MED ORDER — ENOXAPARIN SODIUM 40 MG/0.4ML ~~LOC~~ SOLN
40.0000 mg | SUBCUTANEOUS | Status: DC
Start: 2014-07-21 — End: 2014-09-11

## 2014-07-21 MED ORDER — LISINOPRIL 5 MG PO TABS: 5.0000 mg | ORAL_TABLET | Freq: Every day | ORAL | Status: DC

## 2014-07-21 MED ORDER — WARFARIN VIDEO: Freq: Once | Status: DC

## 2014-07-21 MED ORDER — HYDROCODONE-ACETAMINOPHEN 5-325 MG PO TABS: 1.0000 | ORAL_TABLET | Freq: Four times a day (QID) | ORAL | Status: DC | PRN

## 2014-07-21 NOTE — Progress Notes (Signed)
[name] to be D/C'd [facility] per MD order.  Discussed with the patient and all questions fully answered.    Medication List         ACCU-CHEK AVIVA PLUS test strip  Generic drug:  glucose blood     albuterol (2.5 MG/3ML) 0.083% nebulizer solution  Commonly known as:  PROVENTIL  Take 2.5 mg by nebulization every 6 (six) hours as needed (bronchitis).     aspirin 81 MG EC tablet  Take 1 tablet (81 mg total) by mouth daily.     atorvastatin 40 MG tablet  Commonly known as:  LIPITOR  Take 1 tablet (40 mg total) by mouth daily at 6 PM.     calcium carbonate 500 MG chewable tablet  Commonly known as:  TUMS - dosed in mg elemental calcium  Chew 3 tablets by mouth daily as needed for indigestion or heartburn.     clopidogrel 75 MG tablet  Commonly known as:  PLAVIX  Take 1 tablet (75 mg total) by mouth daily with breakfast.     COMBIVENT RESPIMAT 20-100 MCG/ACT Aers respimat  Generic drug:  Ipratropium-Albuterol  Inhale 1 puff into the lungs 2 (two) times daily as needed for wheezing.     enoxaparin 40 MG/0.4ML injection  Commonly known as:  LOVENOX  Inject 0.4 mLs (40 mg total) into the skin daily.     esomeprazole 40 MG capsule  Commonly known as:  NEXIUM  Take 40 mg by mouth as needed (for heartburm/ acid reflux).     HYDROcodone-acetaminophen 5-325 MG per tablet  Commonly known as:  NORCO/VICODIN  Take 1-2 tablets by mouth every 6 (six) hours as needed for moderate pain.     ibuprofen 200 MG tablet  Commonly known as:  ADVIL,MOTRIN  Take 600 mg by mouth every 4 (four) hours as needed (pain).     insulin aspart 100 UNIT/ML injection  Commonly known as:  novoLOG  Inject 10-25 Units into the skin 3 (three) times daily before meals. Units given depends on her CBG readings. < 200 gives 10 units, > 200 gives 20 units, 20-25 units at supper depending on CBG reading     Insulin Detemir 100 UNIT/ML Pen  Commonly known as:  LEVEMIR FLEXTOUCH  Inject 40 Units into the  skin 2 (two) times daily.     lisinopril 5 MG tablet  Commonly known as:  PRINIVIL,ZESTRIL  Take 1 tablet (5 mg total) by mouth daily.     metoprolol tartrate 25 MG tablet  Commonly known as:  LOPRESSOR  Take 12.5-25 mg by mouth 2 (two) times daily. 25 mg in the am & 12.5 mg in the pm     nicotine 21 mg/24hr patch  Commonly known as:  NICODERM CQ - dosed in mg/24 hours  Place 21 mg onto the skin daily.     nitroGLYCERIN 0.4 MG SL tablet  Commonly known as:  NITROSTAT  Place 1 tablet (0.4 mg total) under the tongue every 5 (five) minutes x 3 doses as needed for chest pain.     pregabalin 50 MG capsule  Commonly known as:  LYRICA  Take 50 mg by mouth 2 (two) times daily.     SYSTANE 0.4-0.3 % Soln  Generic drug:  Polyethyl Glycol-Propyl Glycol  Place 1 drop into both eyes 2 (two) times daily.     Vitamin D 1000 UNITS capsule  Take 1,000 Units by mouth daily.        VVS. Incision  dressing clean dry and intact.  IV catheter discontinued intact. Site without signs and symptoms of complications. Dressing and pressure applied.  Patient escorted via EMS.    Delman Cheadle 07/21/2014 3:40 PM

## 2014-07-21 NOTE — Progress Notes (Signed)
Verbal order from Orthopedic PA to removed surgical dressing and place mepilex foam over upper thigh incision, place gauze on incision around knee then abd pad and wrap with ace wrap.

## 2014-07-21 NOTE — Progress Notes (Signed)
Patient Name: [name] Date of Encounter: 07/21/2014     Principal Problem:   Fracture, femur, distal Active Problems:   Type II diabetes mellitus   Essential hypertension   COPD (chronic obstructive pulmonary disease) with chronic bronchitis   CAD- OM2 DES 06/20/14   TOBACCO ABUSE   GERD (gastroesophageal reflux disease)   Hyperlipidemia    SUBJECTIVE  No chest pain or sob yesterday/last night.  Troponin yesterday morning was nl after c/p the night before.  ECG not done?  L hip pain slightly worse this AM.  Lower leg/foot feels a little numb/swollen.  CURRENT MEDS . aspirin EC  81 mg Oral Daily  . atorvastatin  40 mg Oral q1800  . Chlorhexidine Gluconate Cloth  6 each Topical Q0600  . cholecalciferol  1,000 Units Oral Daily  . clopidogrel  75 mg Oral Daily  . insulin aspart  0-15 Units Subcutaneous TID WC  . insulin aspart  6 Units Subcutaneous TID WC  . insulin detemir  40 Units Subcutaneous BID  . metoprolol tartrate  12.5 mg Oral QHS  . metoprolol tartrate  25 mg Oral Daily  . mupirocin ointment  1 application Nasal BID  . nicotine  21 mg Transdermal Daily  . pantoprazole  40 mg Oral Daily  . polyethylene glycol  17 g Oral Daily  . polyvinyl alcohol  1 drop Both Eyes BID  . pregabalin  50 mg Oral BID  . senna  1 tablet Oral BID  . warfarin  5 mg Oral ONCE-1800  . Warfarin - Pharmacist Dosing Inpatient   Does not apply q1800    OBJECTIVE  Filed Vitals:   07/20/14 2209 07/20/14 2339 07/21/14 0250 07/21/14 0621  BP:  124/51 141/61 117/56  Pulse:  106 104 102  Temp: 98.7 F (37.1 C) 99.4 F (37.4 C)  98.6 F (37 C)  TempSrc:  Oral  Oral  Resp:  16  16  Height:      Weight:    210 lb 3.2 oz (95.346 kg)  SpO2:  94%  96%    Intake/Output Summary (Last 24 hours) at 07/21/14 0837 Last data filed at 07/21/14 0624  Gross per 24 hour  Intake    838 ml  Output   2900 ml  Net  -2062 ml   Filed Weights   07/18/14 1815 07/20/14 0430 07/21/14 0621    Weight: 211 lb 8 oz (95.936 kg) 210 lb 9.6 oz (95.528 kg) 210 lb 3.2 oz (95.346 kg)    PHYSICAL EXAM  General: Pleasant, NAD. Neuro: Alert and oriented X 3. Moves all extremities spontaneously. Psych: Normal affect. HEENT:  Normal  Neck: Supple without bruits or JVD. Lungs:  Resp regular and unlabored, crackles left base.  otw CTA. Heart: RRR no s3, s4, or murmurs. Abdomen: Soft, non-tender, non-distended, BS + x 4.  Extremities: No clubbing, cyanosis.  Trace edema of the left foot. DP/PT/Radials 2+ and equal bilaterally.  Accessory Clinical Findings  CBC  Recent Labs  07/18/14 1024  07/20/14 0235 07/21/14 0440  WBC 9.0  < > 7.9 6.7  NEUTROABS 6.6  --   --   --   HGB 14.4  < > 10.2* 9.8*  HCT 44.0  < > 30.8* 30.3*  MCV 82.9  < > 84.6 85.8  PLT 136*  < > 97* 96*  < > = values in this interval not displayed. Basic Metabolic Panel  Recent Labs  07/20/14 0235 07/21/14 0440  NA 137 136*  K 5.0 4.8  CL 101 101  CO2 25 27  GLUCOSE 237* 175*  BUN 15 9  CREATININE 0.79 0.78  CALCIUM 7.9* 8.3*   Cardiac Enzymes  Recent Labs  07/19/14 0550 07/19/14 1158 07/20/14 1125  TROPONINI <0.30 <0.30 <0.30   TELE  rsr   ASSESSMENT AND PLAN  1. L Femur Fx: S/P ORIF 10/14.  Leg hurts this AM.  Few crackles on exam.  Encouraged incentive spirometry.  Pain mgmt per ortho/IM.  2. USA/CAD: No further c/p since episode in early AM on 10/15.  Trop yesterday morning (~ 8 hrs after pain) was nl.  ECG ordered by not done.  Cont plavix, statin.  Did not get BB last night - reason given: HR/BP.  No vitals recorded at time it was due.  Surrounding vitals stable.  HR is modestly elevated this AM.  Due for BB @ 10 AM.  Ordered P2Y yesterday results not available yet   3. Labile HTN/hypotn: BP stable overnight.  Resume BB as ordered.  4. HL: Cont statin. LDL 86 in Sept.   5. Normocytic Anemia/thrombocytopenia: H/H down but stable.  Plts 96K this morning. Follow on plavix.  Jenkins Rouge

## 2014-07-21 NOTE — Progress Notes (Signed)
Subjective: 2 Days Post-Op Procedure(s) (LRB): INTRAMEDULLARY (IM) NAIL FEMORAL (Left) Patient reports pain as mild.  Progressing with PT  Objective: Vital signs in last 24 hours: Temp:  [98.4 F (36.9 C)-99.4 F (37.4 C)] 99 F (37.2 C) (10/16 0927) Pulse Rate:  [102-117] 105 (10/16 0927) Resp:  [16-18] 16 (10/16 0621) BP: (98-141)/(48-69) 110/69 mmHg (10/16 0927) SpO2:  [94 %-100 %] 96 % (10/16 0621) Weight:  [95.346 kg (210 lb 3.2 oz)] 95.346 kg (210 lb 3.2 oz) (10/16 0621)  Intake/Output from previous day: 10/15 0701 - 10/16 0700 In: 838 [P.O.:838] Out: 2900 [Urine:2900] Intake/Output this shift: Total I/O In: 340 [P.O.:340] Out: 400 [Urine:400]   Recent Labs  07/19/14 0550 07/20/14 0235 07/21/14 0440  HGB 12.7 10.2* 9.8*    Recent Labs  07/20/14 0235 07/21/14 0440  WBC 7.9 6.7  RBC 3.64* 3.53*  HCT 30.8* 30.3*  PLT 97* 96*    Recent Labs  07/20/14 0235 07/21/14 0440  NA 137 136*  K 5.0 4.8  CL 101 101  CO2 25 27  BUN 15 9  CREATININE 0.79 0.78  GLUCOSE 237* 175*  CALCIUM 7.9* 8.3*    Recent Labs  07/21/14 0440  INR 1.13  Left leg exam:  Neurovascular intact Sensation intact distally Intact pulses distally Dorsiflexion/Plantar flexion intact Incision: dressing C/D/I Compartment soft  Assessment/Plan: 2 Days Post-Op Procedure(s) (LRB): INTRAMEDULLARY (IM) NAIL FEMORAL (Left) Plan: Dressing changed bu nursing staff. TDWB on left with knee immobilizer Lovenox x 3 weeks for DVT prophylaxis Will need dressing change of left leg every 4 days with dry dressing Discharge to SNF  Dakoda Laventure G 07/21/2014, 12:48 PM

## 2014-07-21 NOTE — Care Management Note (Addendum)
    Page 1 of 1   07/21/2014     5:11:05 PM CARE MANAGEMENT NOTE 07/21/2014  Patient:  [name], [name]   Account Number:  [id number]  Date Initiated:  07/21/2014  Documentation initiated by:  Tomi Bamberger  Subjective/Objective Assessment:   dx fx above total knee, left  admit- from home.     Action/Plan:   Anticipated DC Date:  07/21/2014   Anticipated DC Plan:  SKILLED NURSING FACILITY  In-house referral  Clinical Social Worker      DC Planning Services  CM consult      Choice offered to / List presented to:             Status of service:  Completed, signed off Medicare Important Message given?  YES (If response is "NO", the following Medicare IM given date fields will be blank) Date Medicare IM given:  07/21/2014 Medicare IM given by:  Tomi Bamberger Date Additional Medicare IM given:   Additional Medicare IM given by:    Discharge Disposition:  [facility]  Per UR Regulation:  Reviewed for med. necessity/level of care/duration of stay  If discussed at [place] of Stay Meetings, dates discussed:    Comments:  07/21/14 [place], BSN [phone] pt dc to snf, CSW following.

## 2014-07-21 NOTE — Discharge Summary (Signed)
Physician Discharge Summary  [name] VQM:[id number] DOB: 1946/05/22 DOA: 07/18/2014  PCP: [name], MD  Admit date: 07/18/2014 Discharge date: 07/21/2014  Time spent: 40 minutes  Recommendations for Outpatient Follow-up:  1. Followup with primary care physician in one week. 2. Continue Lovenox 40 mg daily for 3 weeks for DVT prophylaxis post femoral fracture.  Discharge Diagnoses:  Principal Problem:   Fracture, femur, distal Active Problems:   Type II diabetes mellitus   TOBACCO ABUSE   Essential hypertension   COPD (chronic obstructive pulmonary disease) with chronic bronchitis   CAD- OM2 DES 06/20/14   GERD (gastroesophageal reflux disease)   Hyperlipidemia   Discharge Condition: Stable Weightbearing status: Touchdown weightbearing Diet recommendation: Carbohydrate modified diet  Filed Weights   07/18/14 1815 07/20/14 0430 07/21/14 0621  Weight: 95.936 kg (211 lb 8 oz) 95.528 kg (210 lb 9.6 oz) 95.346 kg (210 lb 3.2 oz)    History of present illness:  [name] is a 68 y.o. female with history of asthma/COPD on nightly oxygen 2-4 L per minute, CAD-PCI with DES to her OM 2 on 06/20/14 and prior stenting of RCA in 2005, recently quit smoking, type II DM/IDDM, GERD, lung cancer said to be in remission, morbid obesity, hyperlipidemia, presented to the ED following a fall at home and left knee pain. This morning, she was trying to step onto the porch when she became unsteady and fell on her knees followed immediately by excruciating left knee pain. She denied any other injuries, head injury, chest pain, palpitations, dizziness or lightheadedness. She presented to the ED where imaging confirms left periprosthetic distal femur fracture. Orthopedics has consulted and plan retrograde intramedullary fixation of her fracture for 07/19/14 and she has currently been placed on a knee immobilizer. Hospitalist admission requested.  Hospital Course:   Left femoral fracture   -Left femoral periprosthetic fracture. Orthopedics consulted.  -Patient had surgery with intramedullary nail fixation -Pain is to be controlled with narcotics. -Lovenox for DVT prophylaxis as outpatient for at least 20 days.  Hypotension  -Patient has history of hypertension she is on lisinopril and metoprolol, narcotic pain medication also is contributing. -Lisinopril held while she is in the hospital and metoprolol intermittently according to heart rate and blood pressure. -This is treated with as needed boluses of IV fluids, and discharged lisinopril decreased to 5 mg.  CAD  -Patient complained this morning about chest pain which is resolved now.  -Has history of recent stent to the circumflex placed on 06/20/14.  -Per cardiac catheterization at that time no other significant stenotic lesions.   Type 2 diabetes mellitus  -Uncontrolled type 2 diabetes mellitus, hemoglobin A1c was 8.9 on 06/19/14.  -Patient is taken 45-50 units of Levemir at night at home. -Levemir insulin increased to 40 units twice a day please follow CBGs closely.  History of lung cancer  -Outpatient followup, reported to be in remission.   GERD  -Continue PPI   Procedures:  07/19/14 Dr. Berenice Primas: Open reduction and internal fixation of supracondylar femur fracture above the total knee with intramedullary rod with 3 cross screws locked in the distal fragment and 2 screws locked in the proximal fragment.  Consultations:  Orthopedics  Discharge Exam: Filed Vitals:   07/21/14 0927  BP: 110/69  Pulse: 105  Temp: 99 F (37.2 C)  Resp:    General: Alert and awake, oriented x3, not in any acute distress. HEENT: anicteric sclera, pupils reactive to light and accommodation, EOMI CVS: S1-S2 clear, no murmur  rubs or gallops Chest: clear to auscultation bilaterally, no wheezing, rales or rhonchi Abdomen: soft nontender, nondistended, normal bowel sounds, no organomegaly Extremities: no cyanosis, clubbing or  edema noted bilaterally Neuro: Cranial nerves II-XII intact, no focal neurological deficits  Discharge Instructions You were cared for by a hospitalist during your hospital stay. If you have any questions about your discharge medications or the care you received while you were in the hospital after you are discharged, you can call the unit and asked to speak with the hospitalist on call if the hospitalist that took care of you is not available. Once you are discharged, your primary care physician will handle any further medical issues. Please note that NO REFILLS for any discharge medications will be authorized once you are discharged, as it is imperative that you return to your primary care physician (or establish a relationship with a primary care physician if you do not have one) for your aftercare needs so that they can reassess your need for medications and monitor your lab values.  Discharge Instructions   Diet Carb Modified    Complete by:  As directed      Increase activity slowly    Complete by:  As directed      Touch down weight bearing    Complete by:  As directed   Laterality:  left  Extremity:  Lower          Current Discharge Medication List    START taking these medications   Details  enoxaparin (LOVENOX) 40 MG/0.4ML injection Inject 0.4 mLs (40 mg total) into the skin daily. Qty: 0 Syringe      CONTINUE these medications which have CHANGED   Details  HYDROcodone-acetaminophen (NORCO/VICODIN) 5-325 MG per tablet Take 1-2 tablets by mouth every 6 (six) hours as needed for moderate pain. Qty: 10 tablet, Refills: 0    Insulin Detemir (LEVEMIR FLEXTOUCH) 100 UNIT/ML Pen Inject 40 Units into the skin 2 (two) times daily.    lisinopril (PRINIVIL,ZESTRIL) 5 MG tablet Take 1 tablet (5 mg total) by mouth daily.      CONTINUE these medications which have NOT CHANGED   Details  albuterol (PROVENTIL) (2.5 MG/3ML) 0.083% nebulizer solution Take 2.5 mg by nebulization every 6  (six) hours as needed (bronchitis).    aspirin EC 81 MG EC tablet Take 1 tablet (81 mg total) by mouth daily.    atorvastatin (LIPITOR) 40 MG tablet Take 1 tablet (40 mg total) by mouth daily at 6 PM. Qty: 30 tablet, Refills: 11    calcium carbonate (TUMS - DOSED IN MG ELEMENTAL CALCIUM) 500 MG chewable tablet Chew 3 tablets by mouth daily as needed for indigestion or heartburn.    Cholecalciferol (VITAMIN D) 1000 UNITS capsule Take 1,000 Units by mouth daily.     clopidogrel (PLAVIX) 75 MG tablet Take 1 tablet (75 mg total) by mouth daily with breakfast. Qty: 30 tablet, Refills: 11    esomeprazole (NEXIUM) 40 MG capsule Take 40 mg by mouth as needed (for heartburm/ acid reflux).     ibuprofen (ADVIL,MOTRIN) 200 MG tablet Take 600 mg by mouth every 4 (four) hours as needed (pain).     insulin aspart (NOVOLOG) 100 UNIT/ML injection Inject 10-25 Units into the skin 3 (three) times daily before meals. Units given depends on her CBG readings. < 200 gives 10 units, > 200 gives 20 units, 20-25 units at supper depending on CBG reading    Ipratropium-Albuterol (COMBIVENT RESPIMAT) 20-100 MCG/ACT AERS respimat Inhale  1 puff into the lungs 2 (two) times daily as needed for wheezing.    metoprolol tartrate (LOPRESSOR) 25 MG tablet Take 12.5-25 mg by mouth 2 (two) times daily. 25 mg in the am & 12.5 mg in the pm    nicotine (NICODERM CQ - DOSED IN MG/24 HOURS) 21 mg/24hr patch Place 21 mg onto the skin daily.    nitroGLYCERIN (NITROSTAT) 0.4 MG SL tablet Place 1 tablet (0.4 mg total) under the tongue every 5 (five) minutes x 3 doses as needed for chest pain. Qty: 25 tablet, Refills: 2    Polyethyl Glycol-Propyl Glycol (SYSTANE) 0.4-0.3 % SOLN Place 1 drop into both eyes 2 (two) times daily.     pregabalin (LYRICA) 50 MG capsule Take 50 mg by mouth 2 (two) times daily.    ACCU-CHEK AVIVA PLUS test strip        Allergies  Allergen Reactions  . Celebrex [Celecoxib] Palpitations and Other (See  Comments)    Chest pain, tachycardia, diaphoresis  . Niacin Rash  . Varenicline Tartrate Rash    CHANTIX   Follow-up Information   Follow up with GRAVES,JOHN L, MD. Schedule an appointment as soon as possible for a visit in 2 weeks.   Specialty:  Orthopedic Surgery   Contact information:   [place] [id number] [phone]        The results of significant diagnostics from this hospitalization (including imaging, microbiology, ancillary and laboratory) are listed below for reference.    Significant Diagnostic Studies: Dg Chest 1 View  07/18/2014   CLINICAL DATA:  Left leg pain post fall forward onto the ground this morning  EXAM: CHEST - 1 VIEW  COMPARISON:  06/16/2014  FINDINGS: Cardiomediastinal silhouette is stable. Again noted status post right upper lobectomy and postradiation changes in right upper medially. Chronic elevation of the right hemidiaphragm. No acute infiltrate or pulmonary edema. Metallic fixation plate noted cervical spine.  IMPRESSION: No active disease.  Stable postsurgical and postradiation changes.   Electronically Signed   By: Lahoma Crocker M.D.   On: 07/18/2014 10:20   Dg Hip Complete Left  07/18/2014   CLINICAL DATA:  Status post fall this morning  EXAM: LEFT HIP - COMPLETE 2+ VIEW  COMPARISON:  None.  FINDINGS: There is no evidence of hip fracture or dislocation.  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  There is posterior spinal fusion at L4-5 with interbody spacer.  There is peripheral vascular atherosclerotic disease.  IMPRESSION: No acute osseous injury of the left hip.   Electronically Signed   By: Kathreen Devoid   On: 07/18/2014 10:20   Dg Femur Left  07/19/2014   CLINICAL DATA:  ORIF of distal left femoral fracture.  EXAM: DG C-ARM 61-120 MIN; LEFT FEMUR - 2 VIEW  COMPARISON:  Radiographs on 07/18/2014  FINDINGS: Intraoperative imaging obtained with a C-arm demonstrates placement of an intramedullary rod in the femur with additional  transverse fixation screws. Alignment is improved with minimal displacement at the level of fracture.  IMPRESSION: Improved alignment following ORIF of the left femur.   Electronically Signed   By: Aletta Edouard M.D.   On: 07/19/2014 16:07   Dg Femur Left  07/18/2014   CLINICAL DATA:  Fall.  Pain in upper leg.  Initial evaluation.  EXAM: LEFT FEMUR - 2 VIEW  COMPARISON:  None.  FINDINGS: Vascular calcification consistent with peripheral vascular disease noted. Angulated fracture of the distal femur with comminution is noted. The fracture  is just above and may involve the femoral component of the patient's total left knee replacement. Degenerative changes left hip.  IMPRESSION: Angulated comminuted fracture of the distal left femoral metaphysis. The fracture is just above and may involve the femoral component of total knee replacement P   Electronically Signed   By: Marcello Moores  Register   On: 07/18/2014 10:20   Dg Knee Complete 4 Views Left  07/18/2014   CLINICAL DATA:  Fall forward onto the ground this morning, left leg pain  EXAM: LEFT KNEE - COMPLETE 4+ VIEW  COMPARISON:  None.  FINDINGS: Three views of the left knee submitted. There is displaced mild comminuted fracture in distal femoral metaphysis. Left knee prosthesis. The prosthesis is still located.  IMPRESSION: Displaced comminuted fracture in distal femur just above the knee prosthesis.   Electronically Signed   By: Lahoma Crocker M.D.   On: 07/18/2014 10:22   Dg C-arm 1-60 Min  07/19/2014   CLINICAL DATA:  ORIF of distal left femoral fracture.  EXAM: DG C-ARM 61-120 MIN; LEFT FEMUR - 2 VIEW  COMPARISON:  Radiographs on 07/18/2014  FINDINGS: Intraoperative imaging obtained with a C-arm demonstrates placement of an intramedullary rod in the femur with additional transverse fixation screws. Alignment is improved with minimal displacement at the level of fracture.  IMPRESSION: Improved alignment following ORIF of the left femur.   Electronically Signed    By: Aletta Edouard M.D.   On: 07/19/2014 16:07    Microbiology: Recent Results (from the past 240 hour(s))  SURGICAL PCR SCREEN     Status: Abnormal   Collection Time    07/18/14  6:36 PM      Result Value Ref Range Status   MRSA, PCR POSITIVE (*) NEGATIVE Final   Staphylococcus aureus POSITIVE (*) NEGATIVE Final   Comment:            The Xpert SA Assay (FDA     approved for NASAL specimens     in patients over 50 years of age),     is one component of     a comprehensive surveillance     program.  Test performance has     been validated by [employer] for patients greater     than or equal to 85 year old.     It is not intended     to diagnose infection nor to     guide or monitor treatment.     Labs: Basic Metabolic Panel:  Recent Labs Lab 07/18/14 1024 07/18/14 1234 07/19/14 0550 07/20/14 0235 07/21/14 0440  NA 136*  --  135* 137 136*  K 5.9* 4.7 4.4 5.0 4.8  CL 97  --  97 101 101  CO2 25  --  22 25 27   GLUCOSE 317*  --  272* 237* 175*  BUN 19  --  22 15 9   CREATININE 0.80  --  1.10 0.79 0.78  CALCIUM 10.0  --  8.8 7.9* 8.3*   Liver Function Tests: No results found for this basename: AST, ALT, ALKPHOS, BILITOT, PROT, ALBUMIN,  in the last 168 hours No results found for this basename: LIPASE, AMYLASE,  in the last 168 hours No results found for this basename: AMMONIA,  in the last 168 hours CBC:  Recent Labs Lab 07/18/14 1024 07/19/14 0550 07/20/14 0235 07/21/14 0440  WBC 9.0 8.1 7.9 6.7  NEUTROABS 6.6  --   --   --   HGB 14.4 12.7  10.2* 9.8*  HCT 44.0 38.3 30.8* 30.3*  MCV 82.9 83.4 84.6 85.8  PLT 136* 111* 97* 96*   Cardiac Enzymes:  Recent Labs Lab 07/19/14 0550 07/19/14 1158 07/20/14 1125  TROPONINI <0.30 <0.30 <0.30   BNP: BNP (last 3 results)  Recent Labs  06/16/14 1603  PROBNP 142.5*   CBG:  Recent Labs Lab 07/20/14 2159 07/20/14 2332 07/21/14 0244 07/21/14 0609 07/21/14 0903  GLUCAP 138* 150* 170* 169* 204*        Signed:  Blakely Gluth A  [facility] 07/21/2014, 12:19 PM

## 2014-07-21 NOTE — Progress Notes (Signed)
Physical Therapy Treatment Patient Details Name: [name] MRN: [id number] DOB: Nov 21, 1945 Today's Date: 07/21/2014    History of Present Illness Patient is a 68 y/o female s/p ORIF Left femur 10/14 secondary to fall and left periprosthetic distal femur fx. Developed hypotension last night in setting of already soft BP (97/36).  Pressure dropped to 70/30 and she required IVF.  In that setting pt developed chest pain. Rapid response called. PMH of asthma, CAD, DM, HLD, COPD, HTN and lung ca.      PT Comments    Patient progressing well with mobility. Tolerated multiple short bouts of ambulation while maintaining WB status however dyspnea present requiring longer rest breaks between bouts. Requires less assist with transfers today however continues to exhibit balance deficits affecting safe mobility. Highly motivated. Will continue to follow and progress as tolerated.   Follow Up Recommendations  SNF;Supervision/Assistance - 24 hour     Equipment Recommendations  None recommended by PT    Recommendations for Other Services       Precautions / Restrictions Precautions Precautions: Fall Required Braces or Orthoses: Knee Immobilizer - Left Knee Immobilizer - Left: On at all times Restrictions Weight Bearing Restrictions: Yes LLE Weight Bearing: Touchdown weight bearing    Mobility  Bed Mobility Overal bed mobility: Needs Assistance Bed Mobility: Supine to Sit     Supine to sit: Min assist;HOB elevated     General bed mobility comments: Use of rails. VC's for technique and assist with mobilizing LLE to EOB.  Transfers Overall transfer level: Needs assistance Equipment used: Rolling walker (2 wheeled) Transfers: Sit to/from Stand Sit to Stand: Min assist         General transfer comment: Min A to rise and for balance. VC's for technique and hand placement as pt TDWB LLE. NO dizziness today. Stood from [employer], from recliner x4.  Ambulation/Gait Ambulation/Gait  assistance: Min guard Ambulation Distance (Feet): 8 Feet (+10' +10') Assistive device: Rolling walker (2 wheeled) Gait Pattern/deviations: Step-to pattern;Trunk flexed;Decreased stance time - left;Decreased weight shift to left;Antalgic Gait velocity: decreased   General Gait Details: VC for upright posture and for increased WB through BUEs. Compliant with TDWB. Dyspnea present. 2 seated rest breaks due to SOB. Sa02 >90%, HR increased to 122 bpm.   Stairs            Wheelchair Mobility    Modified Rankin (Stroke Patients Only)       Balance Overall balance assessment: Needs assistance   Sitting balance-Leahy Scale: Good Sitting balance - Comments: Able to weightshift without LOB.    Standing balance support: During functional activity;Bilateral upper extremity supported Standing balance-Leahy Scale: Poor Standing balance comment: Requires UE support on RW for static and dynamic standing due to WB restrictions and balance.                    Cognition Arousal/Alertness: Awake/alert Behavior During Therapy: WFL for tasks assessed/performed Overall Cognitive Status: Within Functional Limits for tasks assessed                      Exercises General Exercises - Lower Extremity Ankle Circles/Pumps: Both;10 reps;Seated    General Comments General comments (skin integrity, edema, etc.): Pt now on 2L 02 [employer]. Requires longer seated rest breaks due to SOB and increased HR.      Pertinent Vitals/Pain Pain Assessment: No/denies pain    Home Living  Prior Function            PT Goals (current goals can now be found in the care plan section) Progress towards PT goals: Progressing toward goals    Frequency       PT Plan Current plan remains appropriate    Co-evaluation             End of Session Equipment Utilized During Treatment: Gait belt;Oxygen;Left knee immobilizer Activity Tolerance: Patient tolerated  treatment well Patient left: in chair;with call bell/phone within reach;with family/visitor present;with nursing/sitter in room     Time: 1015-1056 PT Time Calculation (min): 41 min  Charges:  $Gait Training: 8-22 mins $Therapeutic Activity: 23-37 mins                    G CodesCandy Sledge A Jul 26, 2014, 11:41 AM Candy Sledge, [place], DPT [phone]

## 2014-07-21 NOTE — Progress Notes (Signed)
Report given to [facility], Nurse at [facility]

## 2014-07-21 NOTE — Discharge Instructions (Signed)
TDWB on left with knee immobilizer  Information on my medicine - Coumadin   (Warfarin)  This medication education was reviewed with me or my healthcare representative as part of my discharge preparation.   Why was Coumadin prescribed for you? Coumadin was prescribed for you because you have a blood clot or a medical condition that can cause an increased risk of forming blood clots. Blood clots can cause serious health problems by blocking the flow of blood to the heart, lung, or brain. Coumadin can prevent harmful blood clots from forming. As a reminder your indication for Coumadin is:   Blood Clot Prevention After Orthopedic Surgery  What test will check on my response to Coumadin? While on Coumadin (warfarin) you will need to have an INR test regularly to ensure that your dose is keeping you in the desired range. The INR (international normalized ratio) number is calculated from the result of the laboratory test called prothrombin time (PT).  If an INR APPOINTMENT HAS NOT ALREADY BEEN MADE FOR YOU please schedule an appointment to have this lab work done by your health care provider within 7 days. Your INR goal is usually a number between:  1.5 to 2   What  do you need to  know  About  COUMADIN? Take Coumadin (warfarin) exactly as prescribed by your healthcare provider about the same time each day.  DO NOT stop taking without talking to the doctor who prescribed the medication.  Stopping without other blood clot prevention medication to take the place of Coumadin may increase your risk of developing a new clot or stroke.  Get refills before you run out.  What do you do if you miss a dose? If you miss a dose, take it as soon as you remember on the same day then continue your regularly scheduled regimen the next day.  Do not take two doses of Coumadin at the same time.  Important Safety Information A possible side effect of Coumadin (Warfarin) is an increased risk of bleeding. You should call  your healthcare provider right away if you experience any of the following:   Bleeding from an injury or your nose that does not stop.   Unusual colored urine (red or dark brown) or unusual colored stools (red or black).   Unusual bruising for unknown reasons.   A serious fall or if you hit your head (even if there is no bleeding).  Some foods or medicines interact with Coumadin (warfarin) and might alter your response to warfarin. To help avoid this:   Eat a balanced diet, maintaining a consistent amount of Vitamin K.   Notify your provider about major diet changes you plan to make.   Avoid alcohol or limit your intake to 1 drink for women and 2 drinks for men per day. (1 drink is 5 oz. wine, 12 oz. beer, or 1.5 oz. liquor.)  Make sure that ANY health care provider who prescribes medication for you knows that you are taking Coumadin (warfarin).  Also make sure the healthcare provider who is monitoring your Coumadin knows when you have started a new medication including herbals and non-prescription products.  Coumadin (Warfarin)  Major Drug Interactions  Increased Warfarin Effect Decreased Warfarin Effect  Alcohol (large quantities) Antibiotics (esp. Septra/Bactrim, Flagyl, Cipro) Amiodarone (Cordarone) Aspirin (ASA) Cimetidine (Tagamet) Megestrol (Megace) NSAIDs (ibuprofen, naproxen, etc.) Piroxicam (Feldene) Propafenone (Rythmol SR) Propranolol (Inderal) Isoniazid (INH) Posaconazole (Noxafil) Barbiturates (Phenobarbital) Carbamazepine (Tegretol) Chlordiazepoxide (Librium) Cholestyramine (Questran) Griseofulvin Oral Contraceptives Rifampin Sucralfate (Carafate) Vitamin  K   Coumadin (Warfarin) Major Herbal Interactions  Increased Warfarin Effect Decreased Warfarin Effect  Garlic Ginseng Ginkgo biloba Coenzyme Q10 Green tea St. Johns wort    Coumadin (Warfarin) FOOD Interactions  Eat a consistent number of servings per week of foods HIGH in Vitamin K (1 serving =   cup)  Collards (cooked, or boiled & drained) Kale (cooked, or boiled & drained) Mustard greens (cooked, or boiled & drained) Parsley *serving size only =  cup Spinach (cooked, or boiled & drained) Swiss chard (cooked, or boiled & drained) Turnip greens (cooked, or boiled & drained)  Eat a consistent number of servings per week of foods MEDIUM-HIGH in Vitamin K (1 serving = 1 cup)  Asparagus (cooked, or boiled & drained) Broccoli (cooked, boiled & drained, or raw & chopped) Brussel sprouts (cooked, or boiled & drained) *serving size only =  cup Lettuce, raw (green leaf, endive, romaine) Spinach, raw Turnip greens, raw & chopped   These websites have more information on Coumadin (warfarin):  FailFactory.se; VeganReport.com.au;

## 2014-07-21 NOTE — Progress Notes (Signed)
ANTICOAGULATION CONSULT NOTE - Initial Consult  Pharmacy Consult for Coumadin Indication: VTE prophylaxis  Allergies  Allergen Reactions  . Celebrex [Celecoxib] Palpitations and Other (See Comments)    Chest pain, tachycardia, diaphoresis  . Niacin Rash  . Varenicline Tartrate Rash    CHANTIX    Patient Measurements: Height: 5\' 2"  (157.5 cm) Weight: 210 lb 3.2 oz (95.346 kg) IBW/kg (Calculated) : 50.1  Vital Signs: Temp: 98.6 F (37 C) (10/16 0621) Temp Source: Oral (10/16 0621) BP: 117/56 mmHg (10/16 0621) Pulse Rate: 102 (10/16 0621)  Labs:  Recent Labs  07/18/14 1024 07/19/14 0550 07/19/14 1158 07/20/14 0235 07/20/14 1125 07/21/14 0440  HGB 14.4 12.7  --  10.2*  --  9.8*  HCT 44.0 38.3  --  30.8*  --  30.3*  PLT 136* 111*  --  97*  --  96*  LABPROT 12.2  --   --   --   --  14.6  INR 0.90  --   --   --   --  1.13  CREATININE 0.80 1.10  --  0.79  --  0.78  TROPONINI  --  <0.30 <0.30  --  <0.30  --     Estimated Creatinine Clearance: 72.5 ml/min (by C-G formula based on Cr of 0.78).   Assessment: 68 yo F s/p IM nail for L femur fx, on coumadin for VTE prophylaxis, with lower goal (1.5-2 per ortho request). INR 1.13 this morning, hgb 9.8, plt 96K down from 136 on 10/13.   Goal of Therapy:  INR 1.5-2.0 Monitor platelets by anticoagulation protocol: Yes   Plan:  - Coumadin 5mg  po x 1 - Daily PT/INR - Watch pltc closely and monitor s/sx of bleeding.  Maryanna Shape, PharmD, BCPS  Clinical Pharmacist  Pager: [phone]   07/21/2014,8:19 AM

## 2014-07-24 LAB — GLUCOSE, CAPILLARY
GLUCOSE-CAPILLARY: 305 mg/dL — AB (ref 70–99)
GLUCOSE-CAPILLARY: 213 mg/dL — AB (ref 70–99)
GLUCOSE-CAPILLARY: 224 mg/dL — AB (ref 70–99)
GLUCOSE-CAPILLARY: 222 mg/dL — AB (ref 70–99)
Glucose-Capillary: 236 mg/dL — ABNORMAL HIGH (ref 70–99)
Glucose-Capillary: 241 mg/dL — ABNORMAL HIGH (ref 70–99)
Glucose-Capillary: 242 mg/dL — ABNORMAL HIGH (ref 70–99)
Glucose-Capillary: 262 mg/dL — ABNORMAL HIGH (ref 70–99)
Glucose-Capillary: 282 mg/dL — ABNORMAL HIGH (ref 70–99)
Glucose-Capillary: 284 mg/dL — ABNORMAL HIGH (ref 70–99)
Glucose-Capillary: 255 mg/dL — ABNORMAL HIGH (ref 70–99)
Glucose-Capillary: 179 mg/dL — ABNORMAL HIGH (ref 70–99)
Glucose-Capillary: 319 mg/dL — ABNORMAL HIGH (ref 70–99)

## 2014-07-25 ENCOUNTER — Encounter (HOSPITAL_COMMUNITY): Payer: Self-pay | Admitting: Orthopedic Surgery

## 2014-07-25 ENCOUNTER — Other Ambulatory Visit: Payer: Self-pay | Admitting: *Deleted

## 2014-07-25 LAB — GLUCOSE, CAPILLARY
GLUCOSE-CAPILLARY: 230 mg/dL — AB (ref 70–99)
Glucose-Capillary: 189 mg/dL — ABNORMAL HIGH (ref 70–99)
Glucose-Capillary: 227 mg/dL — ABNORMAL HIGH (ref 70–99)
Glucose-Capillary: 205 mg/dL — ABNORMAL HIGH (ref 70–99)

## 2014-07-25 MED ORDER — HYDROCODONE-ACETAMINOPHEN 5-325 MG PO TABS: 1.0000 | ORAL_TABLET | Freq: Four times a day (QID) | ORAL | Status: DC | PRN

## 2014-07-25 NOTE — Telephone Encounter (Signed)
[facility] 

## 2014-07-26 LAB — GLUCOSE, CAPILLARY
Glucose-Capillary: 161 mg/dL — ABNORMAL HIGH (ref 70–99)
Glucose-Capillary: 214 mg/dL — ABNORMAL HIGH (ref 70–99)
Glucose-Capillary: 234 mg/dL — ABNORMAL HIGH (ref 70–99)
Glucose-Capillary: 231 mg/dL — ABNORMAL HIGH (ref 70–99)

## 2014-07-27 LAB — GLUCOSE, CAPILLARY
GLUCOSE-CAPILLARY: 226 mg/dL — AB (ref 70–99)
Glucose-Capillary: 242 mg/dL — ABNORMAL HIGH (ref 70–99)
Glucose-Capillary: 290 mg/dL — ABNORMAL HIGH (ref 70–99)
Glucose-Capillary: 288 mg/dL — ABNORMAL HIGH (ref 70–99)

## 2014-07-28 LAB — GLUCOSE, CAPILLARY
GLUCOSE-CAPILLARY: 242 mg/dL — AB (ref 70–99)
GLUCOSE-CAPILLARY: 254 mg/dL — AB (ref 70–99)
Glucose-Capillary: 227 mg/dL — ABNORMAL HIGH (ref 70–99)
Glucose-Capillary: 253 mg/dL — ABNORMAL HIGH (ref 70–99)

## 2014-07-29 LAB — GLUCOSE, CAPILLARY
Glucose-Capillary: 257 mg/dL — ABNORMAL HIGH (ref 70–99)
Glucose-Capillary: 315 mg/dL — ABNORMAL HIGH (ref 70–99)
Glucose-Capillary: 302 mg/dL — ABNORMAL HIGH (ref 70–99)
Glucose-Capillary: 263 mg/dL — ABNORMAL HIGH (ref 70–99)

## 2014-07-30 LAB — GLUCOSE, CAPILLARY
GLUCOSE-CAPILLARY: 256 mg/dL — AB (ref 70–99)
GLUCOSE-CAPILLARY: 343 mg/dL — AB (ref 70–99)
GLUCOSE-CAPILLARY: 307 mg/dL — AB (ref 70–99)
Glucose-Capillary: 242 mg/dL — ABNORMAL HIGH (ref 70–99)

## 2014-07-31 LAB — GLUCOSE, CAPILLARY
GLUCOSE-CAPILLARY: 241 mg/dL — AB (ref 70–99)
GLUCOSE-CAPILLARY: 252 mg/dL — AB (ref 70–99)
GLUCOSE-CAPILLARY: 286 mg/dL — AB (ref 70–99)
Glucose-Capillary: 245 mg/dL — ABNORMAL HIGH (ref 70–99)

## 2014-08-01 LAB — GLUCOSE, CAPILLARY
Glucose-Capillary: 209 mg/dL — ABNORMAL HIGH (ref 70–99)
Glucose-Capillary: 225 mg/dL — ABNORMAL HIGH (ref 70–99)
Glucose-Capillary: 229 mg/dL — ABNORMAL HIGH (ref 70–99)

## 2014-08-02 LAB — GLUCOSE, CAPILLARY
GLUCOSE-CAPILLARY: 293 mg/dL — AB (ref 70–99)
Glucose-Capillary: 316 mg/dL — ABNORMAL HIGH (ref 70–99)
Glucose-Capillary: 232 mg/dL — ABNORMAL HIGH (ref 70–99)
Glucose-Capillary: 296 mg/dL — ABNORMAL HIGH (ref 70–99)
Glucose-Capillary: 277 mg/dL — ABNORMAL HIGH (ref 70–99)

## 2014-08-03 LAB — GLUCOSE, CAPILLARY: Glucose-Capillary: 244 mg/dL — ABNORMAL HIGH (ref 70–99)

## 2014-09-11 ENCOUNTER — Ambulatory Visit (INDEPENDENT_AMBULATORY_CARE_PROVIDER_SITE_OTHER): Payer: Medicare Other | Admitting: Cardiology

## 2014-09-11 ENCOUNTER — Encounter: Payer: Self-pay | Admitting: Cardiology

## 2014-09-11 VITALS — BP 122/69 | HR 71 | Ht 61.0 in | Wt 198.0 lb

## 2014-09-11 DIAGNOSIS — I2510 Atherosclerotic heart disease of native coronary artery without angina pectoris: Secondary | ICD-10-CM

## 2014-09-11 DIAGNOSIS — I10 Essential (primary) hypertension: Secondary | ICD-10-CM

## 2014-09-11 NOTE — Progress Notes (Signed)
HPI The patient presents for followup of her known coronary disease. I saw her in September and admitted her with unstable angina. At that time she had a DES stent to the  circumflex acute marginal and OM 2 branch. Since then she fell and had a femur fracture which had to be repaired. She's only had one episode of chest discomfort since that time. She denies any chest pressure currently, neck or arm discomfort. She's had no palpitations, presyncope or syncope. He's had no PND or orthopnea. She has had no weight gain or edema.  Hospital records from her injury and stent were reviewed.    Allergies  Allergen Reactions  . Celebrex [Celecoxib] Palpitations and Other (See Comments)    Chest pain, tachycardia, diaphoresis  . Niacin Rash  . Varenicline Tartrate Rash    CHANTIX    Current Outpatient Prescriptions  Medication Sig Dispense Refill  . ACCU-CHEK AVIVA PLUS test strip     . albuterol (PROVENTIL) (2.5 MG/3ML) 0.083% nebulizer solution Take 2.5 mg by nebulization every 6 (six) hours as needed (bronchitis).    [name] aspirin EC 81 MG EC tablet Take 1 tablet (81 mg total) by mouth daily. (Patient taking differently: Take 81 mg by mouth 2 (two) times daily at 8 am and 10 pm. )    . atorvastatin (LIPITOR) 40 MG tablet Take 1 tablet (40 mg total) by mouth daily at 6 PM. 30 tablet 11  . azithromycin (ZITHROMAX) 250 MG tablet Take 250 mg by mouth once.   0  . B-D INS SYR ULTRAFINE 1CC/30G 30G X 1/2" 1 ML MISC   0  . calcium carbonate (TUMS - DOSED IN MG ELEMENTAL CALCIUM) 500 MG chewable tablet Chew 3 tablets by mouth daily as needed for indigestion or heartburn.    . Cholecalciferol (VITAMIN D) 1000 UNITS capsule Take 1,000 Units by mouth daily.     . clopidogrel (PLAVIX) 75 MG tablet Take 1 tablet (75 mg total) by mouth daily with breakfast. 30 tablet 11  . esomeprazole (NEXIUM) 40 MG capsule Take 40 mg by mouth as needed (for heartburm/ acid reflux).     [name] HYDROcodone-acetaminophen  (NORCO/VICODIN) 5-325 MG per tablet Take 1-2 tablets by mouth every 6 (six) hours as needed for moderate pain. 240 tablet 0  . ibuprofen (ADVIL,MOTRIN) 200 MG tablet Take 600 mg by mouth every 4 (four) hours as needed (pain).     . insulin aspart (NOVOLOG) 100 UNIT/ML injection Inject 10-25 Units into the skin 3 (three) times daily before meals. Units given depends on her CBG readings. < 200 gives 10 units, > 200 gives 20 units, 20-25 units at supper depending on CBG reading    . Insulin Detemir (LEVEMIR FLEXTOUCH) 100 UNIT/ML Pen Inject 40 Units into the skin 2 (two) times daily.    . Ipratropium-Albuterol (COMBIVENT RESPIMAT) 20-100 MCG/ACT AERS respimat Inhale 1 puff into the lungs 2 (two) times daily as needed for wheezing.    [name] lisinopril (PRINIVIL,ZESTRIL) 10 MG tablet   0  . metoprolol tartrate (LOPRESSOR) 25 MG tablet Take 12.5-25 mg by mouth 2 (two) times daily. 25 mg in the am & 12.5 mg in the pm    . nicotine (NICODERM CQ - DOSED IN MG/24 HOURS) 21 mg/24hr patch Place 21 mg onto the skin daily.    . nitroGLYCERIN (NITROSTAT) 0.4 MG SL tablet Place 1 tablet (0.4 mg total) under the tongue every 5 (five) minutes x 3 doses as needed for chest pain. 25  tablet 2  . Polyethyl Glycol-Propyl Glycol (SYSTANE) 0.4-0.3 % SOLN Place 1 drop into both eyes 2 (two) times daily.     . pregabalin (LYRICA) 50 MG capsule Take 50 mg by mouth 2 (two) times daily.    . solifenacin (VESICARE) 10 MG tablet Take 10 mg by mouth daily.     No current facility-administered medications for this visit.    Past Medical History  Diagnosis Date  . Asthma   . Cholelithiasis   . Colon polyps   . Coronary artery disease     a. 2005 Cath/PCI: mRCA-> Cypher DES;  b. 06/2014 Cath/PCI: EF 60%. LM nl, LAD min irregs, D1 40, LCX 20p, OM2 95 (2.5x14 Resolute DES), RCA 30p, 30 ISR, 20d, PDA/PLA nl.  . Type II diabetes mellitus   . GERD (gastroesophageal reflux disease)   . Hyperlipidemia   . Tobacco abuse   . Arthritis     . COPD (chronic obstructive pulmonary disease)     USES O2 AT NIGHT + PRN IN DAYTIME3L  . Emphysema   . H/O hiatal hernia   . Hypertension   . Fibromyalgia   . Lung cancer 2010    Adenocarcinoma, right lung, node positive  . On home oxygen therapy     "2-4L at night and prn" (07/18/2104)  . Chronic bronchitis     Past Surgical History  Procedure Laterality Date  . Appendectomy  1970  . Gallbladder surgery  1989  . Abdominal hysterectomy  1972  . Tonsillectomy  1966  . Carpal tunnel release    . Replacement total knee  2001    left  . Neck surgery  2005  . Pneumonectomy      Right Partial, lung cancer confirmed, nod positive  . Right upper lobectomy with lymph node dissection  10/12/2008    Dr Arlyce Dice  . Coronary angioplasty  2005  . Portacath placement  12/12/2007  . Port-a-cath removal  05/12/2012    Procedure: REMOVAL PORT-A-CATH;  Surgeon: Melrose Nakayama, MD;  Location: [facility];  Service: Thoracic;  Laterality: Left;  [name] Eye surgery      implants 02/18/12 (Left) 02/25/12 (right eye)  . Rotator cuff repair      RIGHT SHOULDER  2 YRS  . Orif humerus fracture Left 12/14/2012    Procedure: OPEN REDUCTION INTERNAL FIXATION (ORIF) LEFT PROXIMAL HUMERUS FRACTURE;  Surgeon: Mcarthur Rossetti, MD;  Location: [facility];  Service: Orthopedics;  Laterality: Left;  . Abdominal hysterectomy    . Joint replacement    . Femur im nail Left 07/19/2014    Procedure: INTRAMEDULLARY (IM) NAIL FEMORAL;  Surgeon: Alta Corning, MD;  Location: [facility];  Service: Orthopedics;  Laterality: Left;    ROS:  As stated in the HPI and negative for all other systems.  PHYSICAL EXAM BP 122/69 mmHg  Pulse 71  Ht 5\' 1"  (1.549 m)  Wt 198 lb (89.812 kg)  BMI 37.43 kg/m2 PHYSICAL EXAM GEN:  No distress NECK:  No jugular venous distention at 90 degrees, waveform within normal limits, carotid upstroke brisk and symmetric, no bruits, no thyromegaly LYMPHATICS:  No cervical adenopathy LUNGS:  Clear to  auscultation bilaterally BACK:  No CVA tenderness CHEST:  Unremarkable HEART:  S1 and S2 within normal limits, no S3, no S4, no clicks, no rubs, no murmurs ABD:  Positive bowel sounds normal in frequency in pitch, no bruits, no rebound, no guarding, unable to assess midline mass or bruit with the patient seated.  EXT:  2 plus pulses throughout, moderate edema in thighs, no cyanosis no clubbing, right left leg in a soft cast.  SKIN:  No rashes no nodules NEURO:  Cranial nerves II through XII grossly intact, motor grossly intact throughout PSYCH:  Cognitively intact, oriented to person place and time   ASSESSMENT AND PLAN  CAD:  The patient has no new sypmtoms.  No further cardiovascular testing is indicated.  We will continue with aggressive risk reduction and meds as listed.  TOBACCO ABUSE:  She is still not smoking. She is tapering down on her nicotine patches.  I am very proud of her.  COPD:  She should continue with her previous medications.  Of note she did have a chest x-ray on the 11th and there were no acute abnormalities.  DM:    She will follow up with [name], MD   Lab Results  Component Value Date   HGBA1C 8.9* 06/19/2014    DYSLIPIDEMIA:   She will remain on the meds as listed.    Lab Results  Component Value Date   CHOL 194 06/20/2014   TRIG 340* 06/20/2014   HDL 40 06/20/2014   LDLCALC 86 06/20/2014

## 2014-09-11 NOTE — Patient Instructions (Signed)
Your physician recommends that you schedule a follow-up appointment in: 6 months with Dr. Hochrein  

## 2014-09-14 ENCOUNTER — Encounter (HOSPITAL_COMMUNITY): Payer: Self-pay | Admitting: Cardiovascular Disease

## 2014-12-04 ENCOUNTER — Ambulatory Visit (HOSPITAL_COMMUNITY)
Admission: RE | Admit: 2014-12-04 | Discharge: 2014-12-04 | Disposition: A | Discharge status: Home / Self Care | Payer: Medicare Other | Source: Ambulatory Visit | Attending: Family Medicine | Admitting: Family Medicine

## 2014-12-04 ENCOUNTER — Other Ambulatory Visit (HOSPITAL_COMMUNITY): Payer: Self-pay | Admitting: Family Medicine

## 2014-12-04 DIAGNOSIS — R1030 Lower abdominal pain, unspecified: Secondary | ICD-10-CM | POA: Insufficient documentation

## 2014-12-04 DIAGNOSIS — R319 Hematuria, unspecified: Secondary | ICD-10-CM

## 2015-01-17 ENCOUNTER — Telehealth: Payer: Self-pay | Admitting: Cardiology

## 2015-01-17 NOTE — Telephone Encounter (Signed)
Incoming call from Dr. Maryjean Ka, re: patient med compliance issue  Pt has been off Plavix x ~2-3 months. He had seen her for unrelated concern, referred to his clinic by orthopedic physician for pain mgmt issues.  She had reported noncompliance w/ plavix d/t SE's (facial swelling, unspec lethargy), during routine med questionaire.  He was concerned - esp when pt verbalized hx w/ stent placement Sept 2015. Dr. Maryjean Ka wanted to see if we would recommend alternative med to start her on.  Conferred w/ Dr. Debara Pickett - he recommended Effient 10mg  daily, see in followup w/ PA (non-urgent) to see if pt tolerating new med, address any other issues, etc   Returned Dr. Maryjean Ka call, he stated understanding, RB&V script, will write for pt for a 30 day supply. I informed we do keep samples available on-hand if pt needs these. He affirmed.  Will send staff message to scheduling to put on PA schedule; pt knows to anticipate a call.

## 2015-01-29 ENCOUNTER — Other Ambulatory Visit: Payer: Self-pay | Admitting: Orthopedic Surgery

## 2015-01-29 DIAGNOSIS — S72452A Displaced supracondylar fracture without intracondylar extension of lower end of left femur, initial encounter for closed fracture: Secondary | ICD-10-CM

## 2015-01-30 ENCOUNTER — Ambulatory Visit
Admission: RE | Admit: 2015-01-30 | Discharge: 2015-01-30 | Disposition: A | Discharge status: Home / Self Care | Payer: Medicare Other | Source: Ambulatory Visit | Attending: Orthopedic Surgery | Admitting: Orthopedic Surgery

## 2015-01-30 DIAGNOSIS — S72452A Displaced supracondylar fracture without intracondylar extension of lower end of left femur, initial encounter for closed fracture: Secondary | ICD-10-CM

## 2015-02-02 ENCOUNTER — Ambulatory Visit (INDEPENDENT_AMBULATORY_CARE_PROVIDER_SITE_OTHER): Payer: Medicare Other | Admitting: Cardiology

## 2015-02-02 ENCOUNTER — Encounter: Payer: Self-pay | Admitting: Cardiology

## 2015-02-02 VITALS — BP 138/70 | HR 91 | Ht 61.0 in | Wt 219.8 lb

## 2015-02-02 DIAGNOSIS — I2510 Atherosclerotic heart disease of native coronary artery without angina pectoris: Secondary | ICD-10-CM

## 2015-02-02 NOTE — Progress Notes (Signed)
HPI The patient presents for followup of her known coronary disease. Last September she was admitted with unstable angina. At that time she had a DES stent to the  circumflex acute marginal and OM 2 branch.  After that she fell and had a femur fracture which had to be repaired. This has apparently had some difficulty healing and she's going to have some other surgery on her hip soon. She also has back problems and her physician wants to put in a nerve stimulator. She's not had any cardiac complaints. She did briefly stop her Plavix because of some facial swelling. We talked to her provider and try to put her on Effient but she couldn't afford this. She then went back to her Plavix taking it at night and she is doing okay with it. She denies any cardiovascular symptoms. She's not having any chest pressure, neck or arm discomfort. She's had no weight gain or edema.  Allergies  Allergen Reactions  . Percocet [Oxycodone-Acetaminophen] Shortness Of Breath  . Celebrex [Celecoxib] Palpitations and Other (See Comments)    Chest pain, tachycardia, diaphoresis  . Niacin Rash  . Varenicline Tartrate Rash    CHANTIX    Current Outpatient Prescriptions  Medication Sig Dispense Refill  . ACCU-CHEK AVIVA PLUS test strip     . albuterol (PROVENTIL) (2.5 MG/3ML) 0.083% nebulizer solution Take 2.5 mg by nebulization every 6 (six) hours as needed (bronchitis).    [name] aspirin EC 81 MG EC tablet Take 1 tablet (81 mg total) by mouth daily. (Patient taking differently: Take 81 mg by mouth 2 (two) times daily at 8 am and 10 pm. )    . atorvastatin (LIPITOR) 40 MG tablet Take 1 tablet (40 mg total) by mouth daily at 6 PM. 30 tablet 11  . B-D INS SYR ULTRAFINE 1CC/30G 30G X 1/2" 1 ML MISC   0  . calcium carbonate (TUMS - DOSED IN MG ELEMENTAL CALCIUM) 500 MG chewable tablet Chew 3 tablets by mouth daily as needed for indigestion or heartburn.    . Cholecalciferol (VITAMIN D) 1000 UNITS capsule Take 1,000 Units by mouth  daily.     . clopidogrel (PLAVIX) 75 MG tablet Take 1 tablet (75 mg total) by mouth daily with breakfast. 30 tablet 11  . esomeprazole (NEXIUM) 40 MG capsule Take 40 mg by mouth as needed (for heartburm/ acid reflux).     [name] HYDROcodone-acetaminophen (NORCO/VICODIN) 5-325 MG per tablet Take 1-2 tablets by mouth every 6 (six) hours as needed for moderate pain. 240 tablet 0  . ibuprofen (ADVIL,MOTRIN) 200 MG tablet Take 600 mg by mouth every 4 (four) hours as needed (pain).     . insulin aspart (NOVOLOG) 100 UNIT/ML injection Inject 10-25 Units into the skin 3 (three) times daily before meals. Units given depends on her CBG readings. < 200 gives 10 units, > 200 gives 20 units, 20-25 units at supper depending on CBG reading    . Insulin Detemir (LEVEMIR FLEXTOUCH) 100 UNIT/ML Pen Inject 40 Units into the skin 2 (two) times daily. (Patient taking differently: Inject 40 Units into the skin 2 (two) times daily. 45 units in the morning and 40 at night.)    . Ipratropium-Albuterol (COMBIVENT RESPIMAT) 20-100 MCG/ACT AERS respimat Inhale 1 puff into the lungs 2 (two) times daily as needed for wheezing.    [name] lisinopril (PRINIVIL,ZESTRIL) 10 MG tablet Take 10 mg by mouth daily.   0  . metoprolol tartrate (LOPRESSOR) 25 MG tablet Take 12.5-25 mg  by mouth 2 (two) times daily. 25 mg in the am & 12.5 mg in the pm    . nicotine (NICODERM CQ - DOSED IN MG/24 HOURS) 21 mg/24hr patch Place 21 mg onto the skin daily.    . nitroGLYCERIN (NITROSTAT) 0.4 MG SL tablet Place 1 tablet (0.4 mg total) under the tongue every 5 (five) minutes x 3 doses as needed for chest pain. 25 tablet 2  . Polyethyl Glycol-Propyl Glycol (SYSTANE) 0.4-0.3 % SOLN Place 1 drop into both eyes 2 (two) times daily.     . pregabalin (LYRICA) 50 MG capsule Take 50 mg by mouth 2 (two) times daily.     No current facility-administered medications for this visit.    Past Medical History  Diagnosis Date  . Asthma   . Cholelithiasis   . Colon polyps     . Coronary artery disease     a. 2005 Cath/PCI: mRCA-> Cypher DES;  b. 06/2014 Cath/PCI: EF 60%. LM nl, LAD min irregs, D1 40, LCX 20p, OM2 95 (2.5x14 Resolute DES), RCA 30p, 30 ISR, 20d, PDA/PLA nl.  . Type II diabetes mellitus   . GERD (gastroesophageal reflux disease)   . Hyperlipidemia   . Tobacco abuse   . Arthritis   . COPD (chronic obstructive pulmonary disease)     USES O2 AT NIGHT + PRN IN DAYTIME3L  . Emphysema   . H/O hiatal hernia   . Hypertension   . Fibromyalgia   . Lung cancer 2010    Adenocarcinoma, right lung, node positive  . On home oxygen therapy     "2-4L at night and prn" (07/18/2104)  . Chronic bronchitis     Past Surgical History  Procedure Laterality Date  . Appendectomy  1970  . Gallbladder surgery  1989  . Abdominal hysterectomy  1972  . Tonsillectomy  1966  . Carpal tunnel release    . Replacement total knee  2001    left  . Neck surgery  2005  . Pneumonectomy      Right Partial, lung cancer confirmed, nod positive  . Right upper lobectomy with lymph node dissection  10/12/2008    Dr Arlyce Dice  . Coronary angioplasty  2005  . Portacath placement  12/12/2007  . Port-a-cath removal  05/12/2012    Procedure: REMOVAL PORT-A-CATH;  Surgeon: Melrose Nakayama, MD;  Location: [facility];  Service: Thoracic;  Laterality: Left;  [name] Eye surgery      implants 02/18/12 (Left) 02/25/12 (right eye)  . Rotator cuff repair      RIGHT SHOULDER  2 YRS  . Orif humerus fracture Left 12/14/2012    Procedure: OPEN REDUCTION INTERNAL FIXATION (ORIF) LEFT PROXIMAL HUMERUS FRACTURE;  Surgeon: Mcarthur Rossetti, MD;  Location: [facility];  Service: Orthopedics;  Laterality: Left;  . Abdominal hysterectomy    . Joint replacement    . Femur im nail Left 07/19/2014    Procedure: INTRAMEDULLARY (IM) NAIL FEMORAL;  Surgeon: Alta Corning, MD;  Location: [facility];  Service: Orthopedics;  Laterality: Left;  . Left heart catheterization with coronary angiogram N/A 06/20/2014    Procedure:  LEFT HEART CATHETERIZATION WITH CORONARY ANGIOGRAM;  Surgeon: Troy Sine, MD;  Location: [facility] CATH LAB;  Service: Cardiovascular;  Laterality: N/A;    ROS:  As stated in the HPI and negative for all other systems.  PHYSICAL EXAM BP 138/70 mmHg  Pulse 91  Ht '5\' 1"'$  (1.549 m)  Wt 219 lb 12.8 oz (99.701 kg)  BMI 41.55 kg/m2 GENERAL:  Well appearing HEENT:  Pupils equal round and reactive, fundi not visualized, oral mucosa unremarkable NECK:  No jugular venous distention, waveform within normal limits, carotid upstroke brisk and symmetric, no bruits, no thyromegaly LYMPHATICS:  No cervical, inguinal adenopathy LUNGS:  Clear to auscultation bilaterally BACK:  No CVA tenderness CHEST:  Unremarkable HEART:  PMI not displaced or sustained,S1 and S2 within normal limits, no S3, no S4, no clicks, no rubs, no murmurs ABD:  Flat, positive bowel sounds normal in frequency in pitch, no bruits, no rebound, no guarding, no midline pulsatile mass, no hepatomegaly, no splenomegaly EXT:  2 plus pulses throughout, no edema, no cyanosis no clubbing SKIN:  No rashes no nodules NEURO:  Cranial nerves II through XII grossly intact, motor grossly intact throughout PSYCH:  Cognitively intact, oriented to person place and time  EKG:   Sinus rhythm, rate 91, left axis deviation, left anterior fascicular block, no acute ST-T wave changes.  02/02/2015   ASSESSMENT AND PLAN  CAD:  The patient has no new sypmtoms.  No further cardiovascular testing is indicated.  We will continue with aggressive risk reduction and meds as listed.  I would like her to continue on dual antiplatelet therapy for one year. She is however okay for the hip surgery from a cardiovascular standpoint.  TOBACCO ABUSE:  She started smoking again. She is is going to try the patches again and she understands the need to quit smoking.  DM:    I asked her to follow up with [name], MD as her A1c apparently is still above  8.  DYSLIPIDEMIA:   She will remain on the meds as listed.

## 2015-02-02 NOTE — Patient Instructions (Signed)
Dr.Hochrein wants you to follow-up in: [place] will receive a reminder letter in the mail two months in advance. If you don't receive a letter, please call our office to schedule the follow-up appointment.

## 2015-02-05 NOTE — Addendum Note (Signed)
Addended byChauncy Lean. on: 02/05/2015 01:52 PM   Modules accepted: Orders

## 2015-03-28 ENCOUNTER — Other Ambulatory Visit: Payer: Self-pay | Admitting: Cardiology

## 2015-04-02 ENCOUNTER — Other Ambulatory Visit: Payer: Self-pay

## 2015-04-17 DIAGNOSIS — M25562 Pain in left knee: Secondary | ICD-10-CM | POA: Diagnosis not present

## 2015-04-20 ENCOUNTER — Telehealth: Payer: Self-pay | Admitting: Cardiology

## 2015-04-20 NOTE — Telephone Encounter (Signed)
Returning call from yesterday,she does not know who called her.

## 2015-04-20 NOTE — Telephone Encounter (Signed)
Spoke with patient Informed no documentation of call placed yesterday. Patient states her mother took the message originally She states she is having some issues with her knee and it may be orthopedics that called her.

## 2015-04-21 DIAGNOSIS — M25562 Pain in left knee: Secondary | ICD-10-CM | POA: Diagnosis not present

## 2015-04-23 ENCOUNTER — Other Ambulatory Visit: Payer: Self-pay | Admitting: Orthopedic Surgery

## 2015-04-24 DIAGNOSIS — M533 Sacrococcygeal disorders, not elsewhere classified: Secondary | ICD-10-CM | POA: Diagnosis not present

## 2015-04-26 ENCOUNTER — Telehealth: Payer: Self-pay | Admitting: *Deleted

## 2015-04-26 NOTE — Telephone Encounter (Signed)
Spoke with patient regarding postponing surgery. She states she is in so much pain and her leg is still broken and the bolts in her leg are broken. She states she can hardly walk and has to walk with walker. Patient reports the pain pills are not working well for her. Patient states Dr. Mayer Camel told her she has to have this done soon.  Patient states that she "doesn't even have a life." Patient states she has pre-op eval on 7/25 '@1pm'$   Patient was explained the reasoning why MD wanted to postpone until mid-September. Patient was explained risks of stopping dual antiplatelet therapy and the risk on in-stent restenosis. She wishes to come off the medications knowing this risk.   Will route to MD for advice

## 2015-04-26 NOTE — Telephone Encounter (Signed)
OK.  She can have it and hold the Plavix as it is medically necessary.

## 2015-04-26 NOTE — Telephone Encounter (Signed)
Per April 2016 MD note  "CAD: The patient has no new sypmtoms. No further cardiovascular testing is indicated. We will continue with aggressive risk reduction and meds as listed. I would like her to continue on dual antiplatelet therapy for one year. She is however okay for the hip surgery from a cardiovascular standpoint."  1 year post stent is 06/21/2015  Is patient OK to hold aspirin and plavix for knee surgery if necessary - although not denoted on medical clearance form, and if so, how long?

## 2015-04-26 NOTE — Telephone Encounter (Signed)
1. Type of surgery: left total knee revision   2. Date of surgery: 05/09/2015 @ 12:30pm  3. Surgeon: Dr. Frederik Pear [facility])  4. Medications that need to be held & how long: none specified  >> patient takes aspirin & plavix  5. Fax and/or Phone: (fax) [phone]   >> Attn: Tammi Sou - surgery & patient coordinator (direct line: [phone])

## 2015-04-26 NOTE — Telephone Encounter (Signed)
I would want the surgery to be after mid Sept.  One year after stent.  She could then hold her Plavix

## 2015-04-26 NOTE — Telephone Encounter (Signed)
OK for surgery.  Please see the note from April.  No change.

## 2015-04-26 NOTE — Telephone Encounter (Signed)
Clearance was fax via Epic to [facility] and Dr Mayer Camel

## 2015-04-27 NOTE — Telephone Encounter (Signed)
Patient notified that clearance was sent.

## 2015-04-30 ENCOUNTER — Ambulatory Visit (HOSPITAL_COMMUNITY)
Admission: RE | Admit: 2015-04-30 | Discharge: 2015-04-30 | Disposition: A | Discharge status: Home / Self Care | Payer: Medicare Other | Source: Ambulatory Visit | Attending: Orthopedic Surgery | Admitting: Orthopedic Surgery

## 2015-04-30 ENCOUNTER — Encounter (HOSPITAL_COMMUNITY)
Admission: RE | Admit: 2015-04-30 | Discharge: 2015-04-30 | Disposition: A | Discharge status: Home / Self Care | Payer: Medicare Other | Source: Ambulatory Visit | Attending: Orthopedic Surgery | Admitting: Orthopedic Surgery

## 2015-04-30 ENCOUNTER — Encounter (HOSPITAL_COMMUNITY): Payer: Self-pay | Admitting: Vascular Surgery

## 2015-04-30 ENCOUNTER — Encounter (HOSPITAL_COMMUNITY): Payer: Self-pay

## 2015-04-30 DIAGNOSIS — Z01818 Encounter for other preprocedural examination: Secondary | ICD-10-CM

## 2015-04-30 DIAGNOSIS — Z01812 Encounter for preprocedural laboratory examination: Secondary | ICD-10-CM | POA: Diagnosis not present

## 2015-04-30 DIAGNOSIS — J984 Other disorders of lung: Secondary | ICD-10-CM | POA: Diagnosis not present

## 2015-04-30 DIAGNOSIS — J986 Disorders of diaphragm: Secondary | ICD-10-CM | POA: Diagnosis not present

## 2015-04-30 DIAGNOSIS — Z01811 Encounter for preprocedural respiratory examination: Secondary | ICD-10-CM | POA: Diagnosis not present

## 2015-04-30 HISTORY — DX: Dependence on supplemental oxygen: Z99.81

## 2015-04-30 HISTORY — DX: Family history of other disabilities and chronic diseases leading to disablement, not elsewhere classified: Z82.8

## 2015-04-30 HISTORY — DX: Reserved for concepts with insufficient information to code with codable children: IMO0002

## 2015-04-30 LAB — URINALYSIS, ROUTINE W REFLEX MICROSCOPIC
Bilirubin Urine: NEGATIVE
Glucose, UA: >1000 mg/dL — AB
HGB URINE DIPSTICK: NEGATIVE
Ketones, ur: NEGATIVE mg/dL
Nitrite: POSITIVE — AB
PH: 7 (ref 5.0–8.0)
PROTEIN: NEGATIVE mg/dL
Specific Gravity, Urine: 1.024 (ref 1.005–1.030)
Urobilinogen, UA: 0.2 mg/dL (ref 0.0–1.0)

## 2015-04-30 LAB — CBC WITH DIFFERENTIAL/PLATELET
Basophils Absolute: 0 10*3/uL (ref 0.0–0.1)
Basophils Relative: 0 % (ref 0–1)
Eosinophils Absolute: 0.2 10*3/uL (ref 0.0–0.7)
Eosinophils Relative: 2 % (ref 0–5)
HCT: 48.1 % — ABNORMAL HIGH (ref 36.0–46.0)
Hemoglobin: 15.7 g/dL — ABNORMAL HIGH (ref 12.0–15.0)
LYMPHS ABS: 2.6 10*3/uL (ref 0.7–4.0)
LYMPHS PCT: 24 % (ref 12–46)
MCH: 26.7 pg (ref 26.0–34.0)
MCHC: 32.6 g/dL (ref 30.0–36.0)
MCV: 81.7 fL (ref 78.0–100.0)
Monocytes Absolute: 0.8 10*3/uL (ref 0.1–1.0)
Monocytes Relative: 7 % (ref 3–12)
NEUTROS ABS: 7.4 10*3/uL (ref 1.7–7.7)
Neutrophils Relative %: 67 % (ref 43–77)
PLATELETS: 158 10*3/uL (ref 150–400)
RBC: 5.89 MIL/uL — ABNORMAL HIGH (ref 3.87–5.11)
RDW: 15.8 % — AB (ref 11.5–15.5)
WBC: 11 10*3/uL — ABNORMAL HIGH (ref 4.0–10.5)

## 2015-04-30 LAB — PROTIME-INR
INR: 0.97 (ref 0.00–1.49)
PROTHROMBIN TIME: 13.1 s (ref 11.6–15.2)

## 2015-04-30 LAB — BASIC METABOLIC PANEL
Anion gap: 8 (ref 5–15)
BUN: 16 mg/dL (ref 6–20)
CO2: 26 mmol/L (ref 22–32)
CREATININE: 0.71 mg/dL (ref 0.44–1.00)
Calcium: 9.4 mg/dL (ref 8.9–10.3)
Chloride: 103 mmol/L (ref 101–111)
GFR calc non Af Amer: >60 mL/min (ref >60)
Glucose, Bld: 180 mg/dL — ABNORMAL HIGH (ref 65–99)
POTASSIUM: 4.5 mmol/L (ref 3.5–5.1)
Sodium: 137 mmol/L (ref 135–145)

## 2015-04-30 LAB — TYPE AND SCREEN
ABO/RH(D): B POS
ANTIBODY SCREEN: NEGATIVE

## 2015-04-30 LAB — URINE MICROSCOPIC-ADD ON

## 2015-04-30 LAB — GLUCOSE, CAPILLARY: Glucose-Capillary: 199 mg/dL — ABNORMAL HIGH (ref 65–99)

## 2015-04-30 LAB — APTT: aPTT: 28 seconds (ref 24–37)

## 2015-04-30 LAB — SURGICAL PCR SCREEN
MRSA, PCR: POSITIVE — AB
Staphylococcus aureus: POSITIVE — AB

## 2015-04-30 NOTE — Progress Notes (Signed)
   04/30/15 1338  OBSTRUCTIVE SLEEP APNEA  Have you ever been diagnosed with sleep apnea through a sleep study? No  Do you snore loudly (loud enough to be heard through closed doors)?  0  Do you often feel tired, fatigued, or sleepy during the daytime? 1  Has anyone observed you stop breathing during your sleep? 0  Do you have, or are you being treated for high blood pressure? 1  BMI more than 35 kg/m2? 1  Age over 69 years old? 1  Neck circumference greater than 40 cm/16 inches? 1  Gender: 0

## 2015-04-30 NOTE — Progress Notes (Addendum)
PCP: Dr. Delphina Cahill  Pt. Thinks she has the beginning of a sinus infection, stated she will follow up with dr. [name]  And also will check with him about her insulin the day prior to surgery. States her fasting blood sugars are 190-200's and through out the day run 197-230. She states if her sugars get less than 190 she sometimes get shaky.  Instructed her not to take any diabetic meds. The morning of surgery and to call short stay if any problems with her blood sugar that morning either high or low.  Pt. States Dr. Mayer Camel told her to stop plavix 1 week prior to surgery. Left message with his scheduler on instructions about the aspirin. No one has instructed her so far. Told pt. To contact his office if she has not heard from them by tomorrow afternoon.  Cardiologist: Dr, Gennaro Africa

## 2015-04-30 NOTE — Progress Notes (Signed)
Mupirocin Ointment Rx called into [facility] in [place] for positive PCR of MRSA and Staph. Notified pt and she voiced understanding.

## 2015-04-30 NOTE — Pre-Procedure Instructions (Signed)
    [name]  04/30/2015      [name], [name] Phone: [phone] Fax: [phone]  [name], [name] Phone: [phone] Fax: [phone]    Your procedure is scheduled on Wednesday, aug 3  Report to [facility] Main Entrance "A" at 10:45 A.M.  Call this number if you have problems the morning of surgery:  [phone]               Any questions call pre-admission [phone]   Remember:  Do not eat food or drink liquids after midnight.  Take these medicines the morning of surgery with A SIP OF WATER: albuterol nebulizer if needed, nexium if needed,hydrocodone if needed, combivent if needed (bring to hospital), metoprolol, nitroglycerine if needed, systane eye drops, lyrica             Stop plavix per Dr. Mayer Camel orders. (pt. States 1 week prior to surgery)   Do not wear jewelry, make-up or nail polish.  Do not wear lotions, powders, or perfumes.  You may wear deodorant.  Do not shave 48 hours prior to surgery.  Men may shave face and neck.  Do not bring valuables to the hospital.  [facility] is not responsible for any belongings or valuables.  Contacts, dentures or bridgework may not be worn into surgery.  Leave your suitcase in the car.  After surgery it may be brought to your room.  For patients admitted to the hospital, discharge time will be determined by your treatment team.  Patients discharged the day of surgery will not be allowed to drive home.   Name and phone number of your driver:    Special instructions:    Please read over the following fact sheets that you were given. Pain Booklet, Coughing and Deep Breathing, Blood Transfusion Information, Total Joint Packet, MRSA Information and Surgical Site Infection Prevention

## 2015-05-01 DIAGNOSIS — J069 Acute upper respiratory infection, unspecified: Secondary | ICD-10-CM | POA: Diagnosis not present

## 2015-05-01 LAB — HEMOGLOBIN A1C
HEMOGLOBIN A1C: 9.7 % — AB (ref 4.8–5.6)
MEAN PLASMA GLUCOSE: 232 mg/dL

## 2015-05-01 NOTE — Progress Notes (Signed)
Anesthesia Chart Review:  Pt is 69 year old female scheduled for L total knee arthroplasty with revision components, remove [place]oenix nail on 05/09/2015 with Dr. Mayer Camel.   Cardiologist is Dr. Percival Spanish, last office visit 02/02/2015.   PMH includes: CAD (a. 2005 Cath/PCI: mRCA-> Cypher DES; b. 06/2014 Cath/PCI: EF 60%. LM nl, LAD min irregs, D1 40, LCX 20p, OM2 95 (2.5x14 Resolute DES), RCA 30p, 30 ISR, 20d, PDA/PLA nl), HTN, DM, COPD (uses 2-4L O2 at night and prn in daytime), emphysema, hyperlipidemia, lung cancer. Current smoker. BMI 40. S/p L intramedullary femoral nail 07/19/14. S/p L4-5 posterior lumbar fusion 07/27/13. S/p ORIF L proximal humerus fracture 12/14/12. S/p port-a-cath removal 05/12/12.   Medications include: albuterol, ASA, lipitor, plavix, novolog, levemir, combivent, lisinopril, metoprolol, lyrica.   Preoperative labs reviewed.  Glucose 180, HgbA1c 9.7. Urinalysis shows UTI.   Chest x-ray 04/30/2015 reviewed.  -Asymmetric elevation of the R hemi diaphragm is stable -R suprahilar density is also stable and compatible with previously documented radiation fibrosis.  -Stable exam. No new or progressive findings.   EKG 02/02/2015: sinus rhythm. LAD. LAFB. No acute ST-T wave changes.   Pt has cardiac clearance from Dr. Percival Spanish in [place] telephone encounter dated 04/26/2015. Although pt is not yet 1 year out from DES to [place], pt's surgery is apparently medically necessary and she has permission to stop plavix prior to surgery understanding increased risk of in-stent restenosis. Pt reports Dr. Mayer Camel instructed her to stop plavix 1 week prior to surgery.   Called Kasey in Dr. Damita Dunnings office to notify her of elevated hgbA1c and UTI.   If Dr. Mayer Camel considers her diabetes under sufficient control, I anticipate pt will proceed as scheduled.   Willeen Cass, FNP-BC [facility] Short Stay Surgical Center/Anesthesiology Phone: [phone] 05/01/2015 2:57 PM

## 2015-05-03 ENCOUNTER — Telehealth: Payer: Self-pay | Admitting: *Deleted

## 2015-05-03 NOTE — Telephone Encounter (Signed)
Received faxed from [place] wanted to know if it ok for patient to hold ASA, Dr Percival Spanish not in office took faxed to [facility] our Pharmacist who make a note stated it's ok for pt to hold asa for 5 days prior to procedure, faxed was send back to [employer].

## 2015-05-07 ENCOUNTER — Other Ambulatory Visit: Payer: Self-pay | Admitting: Orthopedic Surgery

## 2015-05-10 DIAGNOSIS — M25562 Pain in left knee: Secondary | ICD-10-CM | POA: Diagnosis not present

## 2015-05-10 DIAGNOSIS — S72402K Unspecified fracture of lower end of left femur, subsequent encounter for closed fracture with nonunion: Secondary | ICD-10-CM | POA: Diagnosis not present

## 2015-05-30 DIAGNOSIS — I10 Essential (primary) hypertension: Secondary | ICD-10-CM | POA: Diagnosis not present

## 2015-05-30 DIAGNOSIS — E785 Hyperlipidemia, unspecified: Secondary | ICD-10-CM | POA: Diagnosis not present

## 2015-05-30 DIAGNOSIS — E1165 Type 2 diabetes mellitus with hyperglycemia: Secondary | ICD-10-CM | POA: Diagnosis not present

## 2015-06-01 DIAGNOSIS — E785 Hyperlipidemia, unspecified: Secondary | ICD-10-CM | POA: Diagnosis not present

## 2015-06-01 DIAGNOSIS — F17200 Nicotine dependence, unspecified, uncomplicated: Secondary | ICD-10-CM | POA: Diagnosis not present

## 2015-06-01 DIAGNOSIS — I10 Essential (primary) hypertension: Secondary | ICD-10-CM | POA: Diagnosis not present

## 2015-06-01 DIAGNOSIS — E1165 Type 2 diabetes mellitus with hyperglycemia: Secondary | ICD-10-CM | POA: Diagnosis not present

## 2015-06-01 DIAGNOSIS — J449 Chronic obstructive pulmonary disease, unspecified: Secondary | ICD-10-CM | POA: Diagnosis not present

## 2015-06-07 ENCOUNTER — Encounter (HOSPITAL_COMMUNITY): Payer: Self-pay

## 2015-06-07 ENCOUNTER — Other Ambulatory Visit: Payer: Self-pay | Admitting: Orthopedic Surgery

## 2015-06-07 ENCOUNTER — Encounter (HOSPITAL_COMMUNITY)
Admission: RE | Admit: 2015-06-07 | Discharge: 2015-06-07 | Disposition: A | Discharge status: Home / Self Care | Payer: Medicare Other | Source: Ambulatory Visit | Attending: Orthopedic Surgery | Admitting: Orthopedic Surgery

## 2015-06-07 DIAGNOSIS — Z0183 Encounter for blood typing: Secondary | ICD-10-CM | POA: Diagnosis not present

## 2015-06-07 DIAGNOSIS — Z01812 Encounter for preprocedural laboratory examination: Secondary | ICD-10-CM | POA: Insufficient documentation

## 2015-06-07 LAB — URINALYSIS, ROUTINE W REFLEX MICROSCOPIC
BILIRUBIN URINE: NEGATIVE
Glucose, UA: 250 mg/dL — AB
HGB URINE DIPSTICK: NEGATIVE
KETONES UR: NEGATIVE mg/dL
Leukocytes, UA: NEGATIVE
Nitrite: NEGATIVE
PH: 5.5 (ref 5.0–8.0)
Protein, ur: NEGATIVE mg/dL
SPECIFIC GRAVITY, URINE: 1.011 (ref 1.005–1.030)
UROBILINOGEN UA: 0.2 mg/dL (ref 0.0–1.0)

## 2015-06-07 LAB — CBC WITH DIFFERENTIAL/PLATELET
BASOS ABS: 0 10*3/uL (ref 0.0–0.1)
Basophils Relative: 1 % (ref 0–1)
EOS PCT: 2 % (ref 0–5)
Eosinophils Absolute: 0.1 10*3/uL (ref 0.0–0.7)
HCT: 45.5 % (ref 36.0–46.0)
HEMOGLOBIN: 15 g/dL (ref 12.0–15.0)
LYMPHS ABS: 2.1 10*3/uL (ref 0.7–4.0)
LYMPHS PCT: 31 % (ref 12–46)
MCH: 27.2 pg (ref 26.0–34.0)
MCHC: 33 g/dL (ref 30.0–36.0)
MCV: 82.6 fL (ref 78.0–100.0)
Monocytes Absolute: 0.5 10*3/uL (ref 0.1–1.0)
Monocytes Relative: 7 % (ref 3–12)
NEUTROS ABS: 4.1 10*3/uL (ref 1.7–7.7)
NEUTROS PCT: 60 % (ref 43–77)
PLATELETS: 142 10*3/uL — AB (ref 150–400)
RBC: 5.51 MIL/uL — AB (ref 3.87–5.11)
RDW: 16.3 % — ABNORMAL HIGH (ref 11.5–15.5)
WBC: 6.9 10*3/uL (ref 4.0–10.5)

## 2015-06-07 LAB — TYPE AND SCREEN
ABO/RH(D): B POS
ANTIBODY SCREEN: NEGATIVE

## 2015-06-07 LAB — BASIC METABOLIC PANEL
ANION GAP: 8 (ref 5–15)
BUN: 11 mg/dL (ref 6–20)
CHLORIDE: 107 mmol/L (ref 101–111)
CO2: 23 mmol/L (ref 22–32)
Calcium: 9.2 mg/dL (ref 8.9–10.3)
Creatinine, Ser: 0.78 mg/dL (ref 0.44–1.00)
Glucose, Bld: 147 mg/dL — ABNORMAL HIGH (ref 65–99)
POTASSIUM: 3.9 mmol/L (ref 3.5–5.1)
SODIUM: 138 mmol/L (ref 135–145)

## 2015-06-07 LAB — CARBOXYHEMOGLOBIN
Carboxyhemoglobin: 6.1 % (ref 0.5–1.5)
Methemoglobin: 0.8 % (ref 0.0–1.5)
O2 Saturation: 54 %
Total hemoglobin: 15 g/dL (ref 12.0–16.0)

## 2015-06-07 LAB — GLUCOSE, CAPILLARY: Glucose-Capillary: 148 mg/dL — ABNORMAL HIGH (ref 65–99)

## 2015-06-07 LAB — APTT: APTT: 28 s (ref 24–37)

## 2015-06-07 LAB — PROTIME-INR
INR: 1.03 (ref 0.00–1.49)
Prothrombin Time: 13.7 seconds (ref 11.6–15.2)

## 2015-06-07 NOTE — Progress Notes (Signed)
Lab called with Panic level for carboxyhemoglobin 6.1.  Message left for [facility] @ Dr. Damita Dunnings office.

## 2015-06-07 NOTE — Progress Notes (Signed)
Dr. Damita Dunnings office notified that we need pre-op orders.

## 2015-06-07 NOTE — Pre-Procedure Instructions (Signed)
    [name]  06/07/2015      [facility] [place], [place] [address] [place] [address] Phone: [phone] Fax: [phone]  [facility] [place], [place] - [address] [place] [address] Phone: [phone] Fax: [phone]    Your procedure is scheduled on 06-20-2015    Wednesday   Report to [facility] Admitting at 10:45 A.M.    Call this number if you have problems the morning of surgery:  [phone]   Remember:  Do not eat food or drink liquids after midnight.   Take these medicines the morning of surgery with A SIP OF WATER Levimer 25 units,Albuter inhaler if needed,pain medication ,metoprolol,systane eye drops, nexium,lyrica,spirvia inhaler              Stop plavix  7 days prior to surgery   Do not wear jewelry, make-up or nail polish.  Do not wear lotions, powders, or perfumes.  You may noy wear deodorant.  Do not shave 48 hours prior to surgery.    Do not bring valuables to the hospital.  [facility] is not responsible for any belongings or valuables.  Contacts, dentures or bridgework may not be worn into surgery.  Leave your suitcase in the car.  After surgery it may be brought to your room.  For patients admitted to the hospital, discharge time will be determined by your treatment team.  Patients discharged the day of surgery will not be allowed to drive home.

## 2015-06-08 LAB — HEMOGLOBIN A1C
Hgb A1c MFr Bld: 8.9 % — ABNORMAL HIGH (ref 4.8–5.6)
MEAN PLASMA GLUCOSE: 209 mg/dL

## 2015-06-18 DIAGNOSIS — M25562 Pain in left knee: Secondary | ICD-10-CM | POA: Diagnosis not present

## 2015-06-20 ENCOUNTER — Encounter (HOSPITAL_COMMUNITY): Admission: RE | Payer: Self-pay | Source: Ambulatory Visit

## 2015-06-20 ENCOUNTER — Inpatient Hospital Stay (HOSPITAL_COMMUNITY): Admission: RE | Admit: 2015-06-20 | Payer: Medicare Other | Source: Ambulatory Visit | Admitting: Orthopedic Surgery

## 2015-06-20 SURGERY — TOTAL KNEE ARTHROPLASTY WITH REVISION COMPONENTS
Anesthesia: General | Site: Knee | Laterality: Left

## 2015-06-26 ENCOUNTER — Other Ambulatory Visit: Payer: Self-pay | Admitting: Cardiology

## 2015-06-26 NOTE — Telephone Encounter (Signed)
Rx request sent to pharmacy.  

## 2015-06-29 ENCOUNTER — Other Ambulatory Visit (HOSPITAL_COMMUNITY): Payer: Self-pay | Admitting: Orthopaedic Surgery

## 2015-07-03 ENCOUNTER — Other Ambulatory Visit (HOSPITAL_COMMUNITY): Payer: Self-pay | Admitting: Orthopaedic Surgery

## 2015-07-10 ENCOUNTER — Other Ambulatory Visit: Payer: Self-pay | Admitting: Cardiology

## 2015-07-10 NOTE — Telephone Encounter (Signed)
Rx request sent to pharmacy.  

## 2015-07-12 ENCOUNTER — Encounter (HOSPITAL_COMMUNITY)
Admission: RE | Admit: 2015-07-12 | Discharge: 2015-07-12 | Disposition: A | Discharge status: Home / Self Care | Payer: Medicare Other | Source: Ambulatory Visit | Attending: Orthopaedic Surgery | Admitting: Orthopaedic Surgery

## 2015-07-12 ENCOUNTER — Encounter (HOSPITAL_COMMUNITY): Payer: Self-pay

## 2015-07-12 DIAGNOSIS — Z0183 Encounter for blood typing: Secondary | ICD-10-CM | POA: Diagnosis not present

## 2015-07-12 DIAGNOSIS — Z01812 Encounter for preprocedural laboratory examination: Secondary | ICD-10-CM | POA: Insufficient documentation

## 2015-07-12 DIAGNOSIS — Z01818 Encounter for other preprocedural examination: Secondary | ICD-10-CM | POA: Insufficient documentation

## 2015-07-12 HISTORY — DX: Stress incontinence (female) (male): N39.3

## 2015-07-12 HISTORY — DX: Peripheral vascular disease, unspecified: I73.9

## 2015-07-12 LAB — BASIC METABOLIC PANEL
ANION GAP: 8 (ref 5–15)
BUN: 12 mg/dL (ref 6–20)
CHLORIDE: 110 mmol/L (ref 101–111)
CO2: 23 mmol/L (ref 22–32)
Calcium: 9.1 mg/dL (ref 8.9–10.3)
Creatinine, Ser: 0.58 mg/dL (ref 0.44–1.00)
GFR calc Af Amer: >60 mL/min (ref >60)
GFR calc non Af Amer: >60 mL/min (ref >60)
GLUCOSE: 104 mg/dL — AB (ref 65–99)
POTASSIUM: 3.9 mmol/L (ref 3.5–5.1)
Sodium: 141 mmol/L (ref 135–145)

## 2015-07-12 LAB — SURGICAL PCR SCREEN
MRSA, PCR: NEGATIVE
Staphylococcus aureus: NEGATIVE

## 2015-07-12 LAB — CBC
HEMATOCRIT: 43.1 % (ref 36.0–46.0)
HEMOGLOBIN: 13.8 g/dL (ref 12.0–15.0)
MCH: 26.5 pg (ref 26.0–34.0)
MCHC: 32 g/dL (ref 30.0–36.0)
MCV: 82.9 fL (ref 78.0–100.0)
Platelets: 137 10*3/uL — ABNORMAL LOW (ref 150–400)
RBC: 5.2 MIL/uL — ABNORMAL HIGH (ref 3.87–5.11)
RDW: 15.7 % — AB (ref 11.5–15.5)
WBC: 6.5 10*3/uL (ref 4.0–10.5)

## 2015-07-12 LAB — APTT: aPTT: 29 seconds (ref 24–37)

## 2015-07-12 LAB — PROTIME-INR
INR: 1.03 (ref 0.00–1.49)
Prothrombin Time: 13.7 seconds (ref 11.6–15.2)

## 2015-07-12 LAB — ABO/RH: ABO/RH(D): B POS

## 2015-07-12 NOTE — Progress Notes (Signed)
02/02/15- LOV - cardiology in [facility]  EKG-02/02/2015- [facility]  Cath- 06/20/14- [facility]  CXR- 04/30/2015 [facility]

## 2015-07-12 NOTE — Patient Instructions (Addendum)
[name]  07/12/2015   Your procedure is scheduled on:   Report to [facility] Main  Entrance take [facility]  elevators to 3rd floor to  [place] at AM.  Call this number if you have problems the morning of surgery [phone]   Remember: ONLY 1 PERSON MAY GO WITH YOU TO SHORT STAY TO GET  READY MORNING OF [place].  Do not eat food or drink liquids :After Midnight. Eat a good healthy snack prior to bedtime.                 Take 1/2 of evening dose of Insulin nite before surgery.   Take these medicines the morning of surgery with A SIP OF WATER:   Nexium, Hydrocodone if needed, Metoprolol ( Lopressor), combivent if needed and bring, Lyrica, Spiriva  DO NOT TAKE ANY DIABETIC MEDICATIONS DAY OF YOUR SURGERY                               You may not have any metal on your body including hair pins and              piercings  Do not wear jewelry, make-up, lotions, powders or perfumes, deodorant             Do not wear nail polish.  Do not shave  48 hours prior to surgery.                Do not bring valuables to the hospital. [place].  Contacts, dentures or bridgework may not be worn into surgery.  Leave suitcase in the car. After surgery it may be brought to your room.      Special Instructions: coughing and deep breathing exercises, leg exercises               Please read over the following fact sheets you were given: _____________________________________________________________________             [facility] - Preparing for Surgery Before surgery, you can play an important role.  Because skin is not sterile, your skin needs to be as free of germs as possible.  You can reduce the number of germs on your skin by washing with CHG (chlorahexidine gluconate) soap before surgery.  CHG is an antiseptic cleaner which kills germs and bonds with the skin to continue killing germs even after  washing. Please DO NOT use if you have an allergy to CHG or antibacterial soaps.  If your skin becomes reddened/irritated stop using the CHG and inform your nurse when you arrive at Short Stay. Do not shave (including legs and underarms) for at least 48 hours prior to the first CHG shower.  You may shave your face/neck. Please follow these instructions carefully:  1.  Shower with CHG Soap the night before surgery and the  morning of Surgery.  2.  If you choose to wash your hair, wash your hair first as usual with your  normal  shampoo.  3.  After you shampoo, rinse your hair and body thoroughly to remove the  shampoo.  4.  Use CHG as you would any other liquid soap.  You can apply chg directly  to the skin and wash                       Gently with a scrungie or clean washcloth.  5.  Apply the CHG Soap to your body ONLY FROM THE NECK DOWN.   Do not use on face/ open                           Wound or open sores. Avoid contact with eyes, ears mouth and genitals (private parts).                       Wash face,  Genitals (private parts) with your normal soap.             6.  Wash thoroughly, paying special attention to the area where your surgery  will be performed.  7.  Thoroughly rinse your body with warm water from the neck down.  8.  DO NOT shower/wash with your normal soap after using and rinsing off  the CHG Soap.                9.  Pat yourself dry with a clean towel.            10.  Wear clean pajamas.            11.  Place clean sheets on your bed the night of your first shower and do not  sleep with pets. Day of Surgery : Do not apply any lotions/deodorants the morning of surgery.  Please wear clean clothes to the hospital/surgery center.  FAILURE TO FOLLOW THESE INSTRUCTIONS MAY RESULT IN THE CANCELLATION OF YOUR SURGERY PATIENT SIGNATURE_________________________________  NURSE  SIGNATURE__________________________________  ________________________________________________________________________  WHAT IS A BLOOD TRANSFUSION? Blood Transfusion Information  A transfusion is the replacement of blood or some of its parts. Blood is made up of multiple cells which provide different functions.  Red blood cells carry oxygen and are used for blood loss replacement.  White blood cells fight against infection.  Platelets control bleeding.  Plasma helps clot blood.  Other blood products are available for specialized needs, such as hemophilia or other clotting disorders. BEFORE THE TRANSFUSION  Who gives blood for transfusions?   Healthy volunteers who are fully evaluated to make sure their blood is safe. This is blood bank blood. Transfusion therapy is the safest it has ever been in the practice of medicine. Before blood is taken from a donor, a complete history is taken to make sure that person has no history of diseases nor engages in risky social behavior (examples are intravenous drug use or sexual activity with multiple partners). The donor's travel history is screened to minimize risk of transmitting infections, such as malaria. The donated blood is tested for signs of infectious diseases, such as HIV and hepatitis. The blood is then tested to be sure it is compatible with you in order to minimize the chance of a transfusion reaction. If you or a relative donates blood, this is often done in anticipation of surgery and is not appropriate for emergency situations. It takes many days to process the donated blood. RISKS AND COMPLICATIONS Although transfusion therapy is very safe and saves many lives, the main dangers of transfusion include:   Getting an infectious disease.  Developing a transfusion reaction. This  is an allergic reaction to something in the blood you were given. Every precaution is taken to prevent this. The decision to have a blood transfusion has been  considered carefully by your caregiver before blood is given. Blood is not given unless the benefits outweigh the risks. AFTER THE TRANSFUSION  Right after receiving a blood transfusion, you will usually feel much better and more energetic. This is especially true if your red blood cells have gotten low (anemic). The transfusion raises the level of the red blood cells which carry oxygen, and this usually causes an energy increase.  The nurse administering the transfusion will monitor you carefully for complications. HOME CARE INSTRUCTIONS  No special instructions are needed after a transfusion. You may find your energy is better. Speak with your caregiver about any limitations on activity for underlying diseases you may have. SEEK MEDICAL CARE IF:   Your condition is not improving after your transfusion.  You develop redness or irritation at the intravenous (IV) site. SEEK IMMEDIATE MEDICAL CARE IF:  Any of the following symptoms occur over the next 12 hours:  Shaking chills.  You have a temperature by mouth above 102 F (38.9 C), not controlled by medicine.  Chest, back, or muscle pain.  People around you feel you are not acting correctly or are confused.  Shortness of breath or difficulty breathing.  Dizziness and fainting.  You get a rash or develop hives.  You have a decrease in urine output.  Your urine turns a dark color or changes to pink, red, or brown. Any of the following symptoms occur over the next 10 days:  You have a temperature by mouth above 102 F (38.9 C), not controlled by medicine.  Shortness of breath.  Weakness after normal activity.  The white part of the eye turns yellow (jaundice).  You have a decrease in the amount of urine or are urinating less often.  Your urine turns a dark color or changes to pink, red, or brown. Document Released: 09/19/2000 Document Revised: 12/15/2011 Document Reviewed: 05/08/2008 [facility] Patient Information 2014  [place], [place].  _______________________________________________________________________

## 2015-07-12 NOTE — Progress Notes (Addendum)
Spoke with patient regarding pulse reading todayof 50  at preop appointment. Patient states her pulse has been in the 50s before.  Asked patient to keep a check on pulse at home since she has O2 sat monitor and pulse monitor.  Patient will keep a check on pulse and let cardiologist office be aware if pulse is staying in the 50s.  Pulse checked at home while I had her on the phone and she reported pulse was 89.  Patient voiced no complaints while on phone. Patient did report history of " head not feeling right".. Also told patient to inform cardiologist if her " head does not feel right' and to check pulse .  Patient voiced understanding.

## 2015-07-13 ENCOUNTER — Telehealth: Payer: Self-pay

## 2015-07-13 NOTE — Telephone Encounter (Signed)
Prior auth for Atorvastatin '40mg'$  sent to [facility]

## 2015-07-19 ENCOUNTER — Inpatient Hospital Stay (HOSPITAL_COMMUNITY): Payer: Medicare Other | Admitting: Anesthesiology

## 2015-07-19 ENCOUNTER — Inpatient Hospital Stay (HOSPITAL_COMMUNITY): Payer: Medicare Other

## 2015-07-19 ENCOUNTER — Inpatient Hospital Stay (HOSPITAL_COMMUNITY)
Admission: RE | Admit: 2015-07-19 | Discharge: 2015-07-23 | DRG: 467 | Disposition: A | Discharge status: Home Health | Payer: Medicare Other | Source: Ambulatory Visit | Attending: Orthopaedic Surgery | Admitting: Orthopaedic Surgery

## 2015-07-19 ENCOUNTER — Encounter (HOSPITAL_COMMUNITY)
Admission: RE | Disposition: A | Discharge status: Home Health | Payer: Self-pay | Source: Ambulatory Visit | Attending: Orthopaedic Surgery

## 2015-07-19 ENCOUNTER — Encounter (HOSPITAL_COMMUNITY): Payer: Self-pay | Admitting: Anesthesiology

## 2015-07-19 DIAGNOSIS — Z01812 Encounter for preprocedural laboratory examination: Secondary | ICD-10-CM | POA: Diagnosis not present

## 2015-07-19 DIAGNOSIS — Z7982 Long term (current) use of aspirin: Secondary | ICD-10-CM | POA: Diagnosis not present

## 2015-07-19 DIAGNOSIS — Z7902 Long term (current) use of antithrombotics/antiplatelets: Secondary | ICD-10-CM | POA: Diagnosis not present

## 2015-07-19 DIAGNOSIS — Z9981 Dependence on supplemental oxygen: Secondary | ICD-10-CM | POA: Diagnosis not present

## 2015-07-19 DIAGNOSIS — M1990 Unspecified osteoarthritis, unspecified site: Secondary | ICD-10-CM | POA: Diagnosis present

## 2015-07-19 DIAGNOSIS — D62 Acute posthemorrhagic anemia: Secondary | ICD-10-CM | POA: Diagnosis not present

## 2015-07-19 DIAGNOSIS — Z85118 Personal history of other malignant neoplasm of bronchus and lung: Secondary | ICD-10-CM

## 2015-07-19 DIAGNOSIS — E785 Hyperlipidemia, unspecified: Secondary | ICD-10-CM | POA: Diagnosis not present

## 2015-07-19 DIAGNOSIS — Z79899 Other long term (current) drug therapy: Secondary | ICD-10-CM | POA: Diagnosis not present

## 2015-07-19 DIAGNOSIS — M9712XA Periprosthetic fracture around internal prosthetic left knee joint, initial encounter: Secondary | ICD-10-CM | POA: Diagnosis not present

## 2015-07-19 DIAGNOSIS — Z9181 History of falling: Secondary | ICD-10-CM

## 2015-07-19 DIAGNOSIS — Z9889 Other specified postprocedural states: Secondary | ICD-10-CM | POA: Diagnosis not present

## 2015-07-19 DIAGNOSIS — I10 Essential (primary) hypertension: Secondary | ICD-10-CM | POA: Diagnosis not present

## 2015-07-19 DIAGNOSIS — J45909 Unspecified asthma, uncomplicated: Secondary | ICD-10-CM | POA: Diagnosis present

## 2015-07-19 DIAGNOSIS — Z833 Family history of diabetes mellitus: Secondary | ICD-10-CM | POA: Diagnosis not present

## 2015-07-19 DIAGNOSIS — Z86010 Personal history of colonic polyps: Secondary | ICD-10-CM

## 2015-07-19 DIAGNOSIS — J449 Chronic obstructive pulmonary disease, unspecified: Secondary | ICD-10-CM | POA: Diagnosis present

## 2015-07-19 DIAGNOSIS — I2510 Atherosclerotic heart disease of native coronary artery without angina pectoris: Secondary | ICD-10-CM | POA: Diagnosis present

## 2015-07-19 DIAGNOSIS — Z885 Allergy status to narcotic agent status: Secondary | ICD-10-CM

## 2015-07-19 DIAGNOSIS — M9702XA Periprosthetic fracture around internal prosthetic left hip joint, initial encounter: Secondary | ICD-10-CM | POA: Diagnosis not present

## 2015-07-19 DIAGNOSIS — Z791 Long term (current) use of non-steroidal anti-inflammatories (NSAID): Secondary | ICD-10-CM | POA: Diagnosis not present

## 2015-07-19 DIAGNOSIS — Z888 Allergy status to other drugs, medicaments and biological substances status: Secondary | ICD-10-CM | POA: Diagnosis not present

## 2015-07-19 DIAGNOSIS — E119 Type 2 diabetes mellitus without complications: Secondary | ICD-10-CM | POA: Diagnosis not present

## 2015-07-19 DIAGNOSIS — T84013A Broken internal left knee prosthesis, initial encounter: Secondary | ICD-10-CM | POA: Diagnosis not present

## 2015-07-19 DIAGNOSIS — S72402K Unspecified fracture of lower end of left femur, subsequent encounter for closed fracture with nonunion: Secondary | ICD-10-CM | POA: Diagnosis not present

## 2015-07-19 DIAGNOSIS — Z96652 Presence of left artificial knee joint: Secondary | ICD-10-CM | POA: Diagnosis not present

## 2015-07-19 DIAGNOSIS — F17210 Nicotine dependence, cigarettes, uncomplicated: Secondary | ICD-10-CM | POA: Diagnosis not present

## 2015-07-19 DIAGNOSIS — Z8249 Family history of ischemic heart disease and other diseases of the circulatory system: Secondary | ICD-10-CM | POA: Diagnosis not present

## 2015-07-19 DIAGNOSIS — N393 Stress incontinence (female) (male): Secondary | ICD-10-CM | POA: Diagnosis not present

## 2015-07-19 DIAGNOSIS — M797 Fibromyalgia: Secondary | ICD-10-CM | POA: Diagnosis not present

## 2015-07-19 DIAGNOSIS — Z825 Family history of asthma and other chronic lower respiratory diseases: Secondary | ICD-10-CM

## 2015-07-19 DIAGNOSIS — K219 Gastro-esophageal reflux disease without esophagitis: Secondary | ICD-10-CM | POA: Diagnosis present

## 2015-07-19 DIAGNOSIS — Z969 Presence of functional implant, unspecified: Secondary | ICD-10-CM | POA: Diagnosis not present

## 2015-07-19 DIAGNOSIS — M25552 Pain in left hip: Secondary | ICD-10-CM | POA: Diagnosis present

## 2015-07-19 DIAGNOSIS — Z794 Long term (current) use of insulin: Secondary | ICD-10-CM | POA: Diagnosis not present

## 2015-07-19 DIAGNOSIS — Z90710 Acquired absence of both cervix and uterus: Secondary | ICD-10-CM

## 2015-07-19 DIAGNOSIS — Z978 Presence of other specified devices: Secondary | ICD-10-CM | POA: Diagnosis not present

## 2015-07-19 DIAGNOSIS — Z96659 Presence of unspecified artificial knee joint: Secondary | ICD-10-CM

## 2015-07-19 HISTORY — PX: TOTAL KNEE REVISION: SHX996

## 2015-07-19 HISTORY — PX: HARDWARE REMOVAL: SHX979

## 2015-07-19 LAB — GLUCOSE, CAPILLARY
GLUCOSE-CAPILLARY: 254 mg/dL — AB (ref 65–99)
GLUCOSE-CAPILLARY: 247 mg/dL — AB (ref 65–99)
GLUCOSE-CAPILLARY: 389 mg/dL — AB (ref 65–99)
Glucose-Capillary: 215 mg/dL — ABNORMAL HIGH (ref 65–99)
Glucose-Capillary: 257 mg/dL — ABNORMAL HIGH (ref 65–99)
Glucose-Capillary: 286 mg/dL — ABNORMAL HIGH (ref 65–99)
Glucose-Capillary: 280 mg/dL — ABNORMAL HIGH (ref 65–99)

## 2015-07-19 LAB — TYPE AND SCREEN
ABO/RH(D): B POS
ANTIBODY SCREEN: NEGATIVE

## 2015-07-19 SURGERY — TOTAL KNEE REVISION
Anesthesia: General | Site: Knee | Laterality: Left

## 2015-07-19 MED ORDER — MIDAZOLAM HCL 5 MG/5ML IJ SOLN
INTRAMUSCULAR | Status: DC | PRN
  Administered 2015-07-19: 2 mg via INTRAVENOUS

## 2015-07-19 MED ORDER — FENTANYL CITRATE (PF) 100 MCG/2ML IJ SOLN
INTRAMUSCULAR | Status: DC | PRN
  Administered 2015-07-19: 50 ug via INTRAVENOUS
  Administered 2015-07-19: 100 ug via INTRAVENOUS
  Administered 2015-07-19 (×2): 50 ug via INTRAVENOUS

## 2015-07-19 MED ORDER — ALBUTEROL SULFATE (2.5 MG/3ML) 0.083% IN NEBU
INHALATION_SOLUTION | RESPIRATORY_TRACT | Status: AC
  Filled 2015-07-19: qty 3

## 2015-07-19 MED ORDER — HYDROMORPHONE HCL 1 MG/ML IJ SOLN
INTRAMUSCULAR | Status: DC | PRN
Start: 2015-07-19 — End: 2015-07-19
  Administered 2015-07-19 (×2): .5 mg via INTRAVENOUS
  Administered 2015-07-19: 1 mg via INTRAVENOUS

## 2015-07-19 MED ORDER — INSULIN ASPART 100 UNIT/ML ~~LOC~~ SOLN
SUBCUTANEOUS | Status: AC
  Administered 2015-07-19: 11 [IU] via SUBCUTANEOUS
  Filled 2015-07-19: qty 1

## 2015-07-19 MED ORDER — TRANEXAMIC ACID 1000 MG/10ML IV SOLN
1000.0000 mg | INTRAVENOUS | Status: AC
  Administered 2015-07-19: 1000 mg via INTRAVENOUS
  Filled 2015-07-19: qty 10

## 2015-07-19 MED ORDER — PANTOPRAZOLE SODIUM 40 MG PO TBEC
80.0000 mg | DELAYED_RELEASE_TABLET | Freq: Every day | ORAL | Status: DC
  Administered 2015-07-20 – 2015-07-23 (×4): 80 mg via ORAL
  Filled 2015-07-19 (×5): qty 2

## 2015-07-19 MED ORDER — MIDAZOLAM HCL 2 MG/2ML IJ SOLN
INTRAMUSCULAR | Status: AC
  Filled 2015-07-19: qty 4

## 2015-07-19 MED ORDER — INSULIN ASPART 100 UNIT/ML ~~LOC~~ SOLN
SUBCUTANEOUS | Status: AC
  Filled 2015-07-19: qty 1

## 2015-07-19 MED ORDER — INSULIN ASPART 100 UNIT/ML ~~LOC~~ SOLN
0.0000 [IU] | Freq: Three times a day (TID) | SUBCUTANEOUS | Status: DC
  Administered 2015-07-19: 11 [IU] via SUBCUTANEOUS
  Administered 2015-07-19: 20 [IU] via SUBCUTANEOUS
  Administered 2015-07-20: 7 [IU] via SUBCUTANEOUS
  Administered 2015-07-20: 3 [IU] via SUBCUTANEOUS
  Administered 2015-07-20: 4 [IU] via SUBCUTANEOUS
  Administered 2015-07-21 (×2): 7 [IU] via SUBCUTANEOUS
  Administered 2015-07-23: 4 [IU] via SUBCUTANEOUS
  Administered 2015-07-23 (×2): 7 [IU] via SUBCUTANEOUS

## 2015-07-19 MED ORDER — ASPIRIN EC 81 MG PO TBEC
81.0000 mg | DELAYED_RELEASE_TABLET | Freq: Two times a day (BID) | ORAL | Status: DC
  Administered 2015-07-19 – 2015-07-23 (×8): 81 mg via ORAL
  Filled 2015-07-19 (×12): qty 1

## 2015-07-19 MED ORDER — PROMETHAZINE HCL 25 MG/ML IJ SOLN
6.2500 mg | INTRAMUSCULAR | Status: AC | PRN
  Administered 2015-07-19 (×2): 6.25 mg via INTRAVENOUS

## 2015-07-19 MED ORDER — CLOPIDOGREL BISULFATE 75 MG PO TABS
75.0000 mg | ORAL_TABLET | Freq: Every day | ORAL | Status: DC
  Administered 2015-07-20 – 2015-07-23 (×4): 75 mg via ORAL
  Filled 2015-07-19 (×6): qty 1

## 2015-07-19 MED ORDER — PROPOFOL 10 MG/ML IV BOLUS
INTRAVENOUS | Status: AC
  Filled 2015-07-19: qty 20

## 2015-07-19 MED ORDER — HYDROCODONE-ACETAMINOPHEN 10-325 MG PO TABS
1.0000 | ORAL_TABLET | ORAL | Status: DC | PRN
  Administered 2015-07-19: 1 via ORAL
  Administered 2015-07-19 – 2015-07-20 (×6): 2 via ORAL
  Administered 2015-07-21: 1 via ORAL
  Administered 2015-07-21: 2 via ORAL
  Administered 2015-07-21 (×2): 1 via ORAL
  Administered 2015-07-22: 2 via ORAL
  Administered 2015-07-22 – 2015-07-23 (×6): 1 via ORAL
  Filled 2015-07-19 (×6): qty 1
  Filled 2015-07-19: qty 2
  Filled 2015-07-19: qty 1
  Filled 2015-07-19: qty 2
  Filled 2015-07-19 (×2): qty 1
  Filled 2015-07-19: qty 2
  Filled 2015-07-19: qty 1
  Filled 2015-07-19: qty 2
  Filled 2015-07-19: qty 1
  Filled 2015-07-19 (×2): qty 2
  Filled 2015-07-19: qty 1
  Filled 2015-07-19 (×2): qty 2

## 2015-07-19 MED ORDER — ROCURONIUM BROMIDE 100 MG/10ML IV SOLN
INTRAVENOUS | Status: AC
Start: 2015-07-19 — End: 2015-07-19
  Filled 2015-07-19: qty 1

## 2015-07-19 MED ORDER — RACEPINEPHRINE HCL 2.25 % IN NEBU
0.5000 mL | INHALATION_SOLUTION | Freq: Once | RESPIRATORY_TRACT | Status: DC
  Filled 2015-07-19: qty 0.5

## 2015-07-19 MED ORDER — CEFAZOLIN SODIUM-DEXTROSE 2-3 GM-% IV SOLR
2.0000 g | Freq: Four times a day (QID) | INTRAVENOUS | Status: AC
  Administered 2015-07-19 – 2015-07-20 (×2): 2 g via INTRAVENOUS
  Filled 2015-07-19 (×2): qty 50

## 2015-07-19 MED ORDER — CEFAZOLIN SODIUM-DEXTROSE 2-3 GM-% IV SOLR
2.0000 g | INTRAVENOUS | Status: AC
  Administered 2015-07-19 (×3): 2 g via INTRAVENOUS

## 2015-07-19 MED ORDER — METFORMIN HCL 500 MG PO TABS
500.0000 mg | ORAL_TABLET | Freq: Two times a day (BID) | ORAL | Status: DC
  Administered 2015-07-19 – 2015-07-23 (×7): 500 mg via ORAL
  Filled 2015-07-19 (×10): qty 1

## 2015-07-19 MED ORDER — SODIUM CHLORIDE 0.9 % IR SOLN
Status: DC | PRN
  Administered 2015-07-19: 3000 mL

## 2015-07-19 MED ORDER — PROMETHAZINE HCL 25 MG/ML IJ SOLN
INTRAMUSCULAR | Status: AC
  Filled 2015-07-19: qty 1

## 2015-07-19 MED ORDER — HYDROMORPHONE HCL 1 MG/ML IJ SOLN: 0.2500 mg | INTRAMUSCULAR | Status: DC | PRN

## 2015-07-19 MED ORDER — NEOSTIGMINE METHYLSULFATE 10 MG/10ML IV SOLN
INTRAVENOUS | Status: DC | PRN
  Administered 2015-07-19: 2 mg via INTRAVENOUS

## 2015-07-19 MED ORDER — VITAMIN D 1000 UNITS PO TABS
1000.0000 [IU] | ORAL_TABLET | Freq: Every day | ORAL | Status: DC
  Administered 2015-07-19 – 2015-07-23 (×5): 1000 [IU] via ORAL
  Filled 2015-07-19 (×5): qty 1

## 2015-07-19 MED ORDER — ALBUTEROL SULFATE (2.5 MG/3ML) 0.083% IN NEBU
2.5000 mg | INHALATION_SOLUTION | Freq: Once | RESPIRATORY_TRACT | Status: AC
  Administered 2015-07-19: 2.5 mg via RESPIRATORY_TRACT

## 2015-07-19 MED ORDER — NICOTINE 21 MG/24HR TD PT24
21.0000 mg | MEDICATED_PATCH | Freq: Every day | TRANSDERMAL | Status: DC
  Administered 2015-07-20 – 2015-07-23 (×4): 21 mg via TRANSDERMAL
  Filled 2015-07-19 (×4): qty 1

## 2015-07-19 MED ORDER — NEOSTIGMINE METHYLSULFATE 10 MG/10ML IV SOLN
INTRAVENOUS | Status: AC
Start: 2015-07-19 — End: 2015-07-19
  Filled 2015-07-19: qty 1

## 2015-07-19 MED ORDER — PROPOFOL 10 MG/ML IV BOLUS
INTRAVENOUS | Status: DC | PRN
  Administered 2015-07-19: 50 mg via INTRAVENOUS
  Administered 2015-07-19: 150 mg via INTRAVENOUS

## 2015-07-19 MED ORDER — GLYCOPYRROLATE 0.2 MG/ML IJ SOLN
INTRAMUSCULAR | Status: DC | PRN
  Administered 2015-07-19: .3 mg via INTRAVENOUS

## 2015-07-19 MED ORDER — LIDOCAINE HCL (CARDIAC) 20 MG/ML IV SOLN
INTRAVENOUS | Status: DC | PRN
  Administered 2015-07-19: 75 mg via INTRAVENOUS

## 2015-07-19 MED ORDER — PREGABALIN 50 MG PO CAPS
50.0000 mg | ORAL_CAPSULE | Freq: Every day | ORAL | Status: DC
  Administered 2015-07-19 – 2015-07-22 (×4): 50 mg via ORAL
  Filled 2015-07-19 (×4): qty 1

## 2015-07-19 MED ORDER — PREGABALIN 75 MG PO CAPS
75.0000 mg | ORAL_CAPSULE | Freq: Every day | ORAL | Status: DC
  Administered 2015-07-20 – 2015-07-23 (×4): 75 mg via ORAL
  Filled 2015-07-19 (×4): qty 1

## 2015-07-19 MED ORDER — ONDANSETRON HCL 4 MG/2ML IJ SOLN
INTRAMUSCULAR | Status: DC | PRN
  Administered 2015-07-19: 4 mg via INTRAVENOUS

## 2015-07-19 MED ORDER — DIPHENHYDRAMINE HCL 12.5 MG/5ML PO ELIX: 12.5000 mg | ORAL_SOLUTION | ORAL | Status: DC | PRN

## 2015-07-19 MED ORDER — IPRATROPIUM-ALBUTEROL 20-100 MCG/ACT IN AERS: 1.0000 | INHALATION_SPRAY | Freq: Two times a day (BID) | RESPIRATORY_TRACT | Status: DC | PRN

## 2015-07-19 MED ORDER — IPRATROPIUM-ALBUTEROL 0.5-2.5 (3) MG/3ML IN SOLN
3.0000 mL | Freq: Two times a day (BID) | RESPIRATORY_TRACT | Status: DC | PRN
  Administered 2015-07-23: 3 mL via RESPIRATORY_TRACT
  Filled 2015-07-19: qty 3

## 2015-07-19 MED ORDER — FENTANYL CITRATE (PF) 250 MCG/5ML IJ SOLN
INTRAMUSCULAR | Status: AC
  Filled 2015-07-19: qty 25

## 2015-07-19 MED ORDER — EPHEDRINE SULFATE 50 MG/ML IJ SOLN
INTRAMUSCULAR | Status: DC | PRN
  Administered 2015-07-19 (×3): 10 mg via INTRAVENOUS

## 2015-07-19 MED ORDER — PRAVASTATIN SODIUM 40 MG PO TABS
40.0000 mg | ORAL_TABLET | Freq: Every day | ORAL | Status: DC
  Administered 2015-07-19 – 2015-07-22 (×4): 40 mg via ORAL
  Filled 2015-07-19 (×5): qty 1

## 2015-07-19 MED ORDER — INSULIN DETEMIR 100 UNIT/ML ~~LOC~~ SOLN
25.0000 [IU] | Freq: Two times a day (BID) | SUBCUTANEOUS | Status: DC
  Administered 2015-07-19 – 2015-07-23 (×7): 25 [IU] via SUBCUTANEOUS
  Filled 2015-07-19 (×9): qty 0.25

## 2015-07-19 MED ORDER — METOCLOPRAMIDE HCL 5 MG PO TABS
5.0000 mg | ORAL_TABLET | Freq: Three times a day (TID) | ORAL | Status: DC | PRN
  Filled 2015-07-19: qty 2

## 2015-07-19 MED ORDER — MENTHOL 3 MG MT LOZG: 1.0000 | LOZENGE | OROMUCOSAL | Status: DC | PRN

## 2015-07-19 MED ORDER — CEFAZOLIN SODIUM-DEXTROSE 2-3 GM-% IV SOLR
INTRAVENOUS | Status: AC
  Filled 2015-07-19: qty 50

## 2015-07-19 MED ORDER — SODIUM CHLORIDE 0.9 % IJ SOLN
INTRAMUSCULAR | Status: AC
  Filled 2015-07-19: qty 10

## 2015-07-19 MED ORDER — IPRATROPIUM-ALBUTEROL 0.5-2.5 (3) MG/3ML IN SOLN
3.0000 mL | Freq: Four times a day (QID) | RESPIRATORY_TRACT | Status: DC
  Filled 2015-07-19 (×8): qty 3

## 2015-07-19 MED ORDER — TIOTROPIUM BROMIDE MONOHYDRATE 18 MCG IN CAPS
18.0000 ug | ORAL_CAPSULE | Freq: Every day | RESPIRATORY_TRACT | Status: DC
  Administered 2015-07-20 – 2015-07-23 (×4): 18 ug via RESPIRATORY_TRACT
  Filled 2015-07-19: qty 5

## 2015-07-19 MED ORDER — 0.9 % SODIUM CHLORIDE (POUR BTL) OPTIME
TOPICAL | Status: DC | PRN
  Administered 2015-07-19: 1000 mL

## 2015-07-19 MED ORDER — ROCURONIUM BROMIDE 100 MG/10ML IV SOLN
INTRAVENOUS | Status: DC | PRN
  Administered 2015-07-19: 50 mg via INTRAVENOUS
  Administered 2015-07-19: 10 mg via INTRAVENOUS
  Administered 2015-07-19: 20 mg via INTRAVENOUS

## 2015-07-19 MED ORDER — EPHEDRINE SULFATE 50 MG/ML IJ SOLN
INTRAMUSCULAR | Status: AC
  Filled 2015-07-19: qty 1

## 2015-07-19 MED ORDER — METOCLOPRAMIDE HCL 5 MG/ML IJ SOLN: 5.0000 mg | Freq: Three times a day (TID) | INTRAMUSCULAR | Status: DC | PRN

## 2015-07-19 MED ORDER — KETOROLAC TROMETHAMINE 15 MG/ML IJ SOLN
7.5000 mg | Freq: Four times a day (QID) | INTRAMUSCULAR | Status: AC | PRN
  Administered 2015-07-19: 7.5 mg via INTRAVENOUS
  Filled 2015-07-19: qty 1

## 2015-07-19 MED ORDER — METHOCARBAMOL 500 MG PO TABS
500.0000 mg | ORAL_TABLET | Freq: Four times a day (QID) | ORAL | Status: DC | PRN
  Administered 2015-07-20 (×2): 500 mg via ORAL
  Filled 2015-07-19 (×2): qty 1

## 2015-07-19 MED ORDER — FUROSEMIDE 20 MG PO TABS: 20.0000 mg | ORAL_TABLET | Freq: Every day | ORAL | Status: DC | PRN

## 2015-07-19 MED ORDER — GLYCOPYRROLATE 0.2 MG/ML IJ SOLN
INTRAMUSCULAR | Status: AC
Start: 2015-07-19 — End: 2015-07-19
  Filled 2015-07-19: qty 3

## 2015-07-19 MED ORDER — TIOTROPIUM BROMIDE MONOHYDRATE 2.5 MCG/ACT IN AERS: 2.0000 | INHALATION_SPRAY | Freq: Every day | RESPIRATORY_TRACT | Status: DC

## 2015-07-19 MED ORDER — METHOCARBAMOL 1000 MG/10ML IJ SOLN
500.0000 mg | Freq: Four times a day (QID) | INTRAVENOUS | Status: DC | PRN
  Administered 2015-07-19 – 2015-07-20 (×3): 500 mg via INTRAVENOUS
  Filled 2015-07-19 (×5): qty 5

## 2015-07-19 MED ORDER — HYDROMORPHONE HCL 1 MG/ML IJ SOLN
1.0000 mg | INTRAMUSCULAR | Status: DC | PRN
  Administered 2015-07-19: 1 mg via INTRAVENOUS
  Filled 2015-07-19: qty 1

## 2015-07-19 MED ORDER — SODIUM CHLORIDE 0.9 % IV SOLN
INTRAVENOUS | Status: DC
  Administered 2015-07-19 – 2015-07-20 (×2): via INTRAVENOUS

## 2015-07-19 MED ORDER — HYDROMORPHONE HCL 1 MG/ML IJ SOLN
INTRAMUSCULAR | Status: AC
  Filled 2015-07-19: qty 1

## 2015-07-19 MED ORDER — PHENOL 1.4 % MT LIQD: 1.0000 | OROMUCOSAL | Status: DC | PRN

## 2015-07-19 MED ORDER — ACETAMINOPHEN 650 MG RE SUPP: 650.0000 mg | Freq: Four times a day (QID) | RECTAL | Status: DC | PRN

## 2015-07-19 MED ORDER — DEXAMETHASONE SODIUM PHOSPHATE 10 MG/ML IJ SOLN
INTRAMUSCULAR | Status: AC
  Filled 2015-07-19: qty 1

## 2015-07-19 MED ORDER — LACTATED RINGERS IV SOLN
INTRAVENOUS | Status: DC
  Administered 2015-07-19 (×3): via INTRAVENOUS

## 2015-07-19 MED ORDER — INSULIN ASPART 100 UNIT/ML ~~LOC~~ SOLN
0.0000 [IU] | Freq: Every day | SUBCUTANEOUS | Status: DC
  Administered 2015-07-19: 3 [IU] via SUBCUTANEOUS
  Administered 2015-07-21: 2 [IU] via SUBCUTANEOUS

## 2015-07-19 MED ORDER — NITROGLYCERIN 0.4 MG SL SUBL: 0.4000 mg | SUBLINGUAL_TABLET | SUBLINGUAL | Status: DC | PRN

## 2015-07-19 MED ORDER — HYDROMORPHONE HCL 2 MG/ML IJ SOLN
INTRAMUSCULAR | Status: AC
  Filled 2015-07-19: qty 1

## 2015-07-19 MED ORDER — METOPROLOL TARTRATE 25 MG PO TABS
25.0000 mg | ORAL_TABLET | Freq: Two times a day (BID) | ORAL | Status: DC
  Administered 2015-07-20 – 2015-07-23 (×5): 25 mg via ORAL
  Filled 2015-07-19 (×9): qty 1

## 2015-07-19 MED ORDER — ONDANSETRON HCL 4 MG/2ML IJ SOLN
4.0000 mg | Freq: Four times a day (QID) | INTRAMUSCULAR | Status: DC | PRN
  Administered 2015-07-21: 4 mg via INTRAVENOUS
  Filled 2015-07-19: qty 2

## 2015-07-19 MED ORDER — ALUM & MAG HYDROXIDE-SIMETH 200-200-20 MG/5ML PO SUSP
30.0000 mL | ORAL | Status: DC | PRN
  Administered 2015-07-20: 30 mL via ORAL
  Filled 2015-07-19: qty 30

## 2015-07-19 MED ORDER — POLYVINYL ALCOHOL 1.4 % OP SOLN
1.0000 [drp] | Freq: Two times a day (BID) | OPHTHALMIC | Status: DC
  Administered 2015-07-20 – 2015-07-23 (×6): 1 [drp] via OPHTHALMIC
  Filled 2015-07-19: qty 15

## 2015-07-19 MED ORDER — ACETAMINOPHEN 325 MG PO TABS
650.0000 mg | ORAL_TABLET | Freq: Four times a day (QID) | ORAL | Status: DC | PRN
Start: 2015-07-19 — End: 2015-07-23
  Administered 2015-07-22: 650 mg via ORAL
  Filled 2015-07-19: qty 2

## 2015-07-19 MED ORDER — ONDANSETRON HCL 4 MG PO TABS
4.0000 mg | ORAL_TABLET | Freq: Four times a day (QID) | ORAL | Status: DC | PRN
  Administered 2015-07-22: 4 mg via ORAL
  Filled 2015-07-19: qty 1

## 2015-07-19 MED ORDER — LIDOCAINE HCL (CARDIAC) 20 MG/ML IV SOLN
INTRAVENOUS | Status: AC
  Filled 2015-07-19: qty 5

## 2015-07-19 MED ORDER — INSULIN ASPART 100 UNIT/ML ~~LOC~~ SOLN
15.0000 [IU] | Freq: Three times a day (TID) | SUBCUTANEOUS | Status: DC
  Administered 2015-07-20: 15 [IU] via SUBCUTANEOUS
  Administered 2015-07-20: 20 [IU] via SUBCUTANEOUS
  Administered 2015-07-20 – 2015-07-22 (×3): 15 [IU] via SUBCUTANEOUS

## 2015-07-19 SURGICAL SUPPLY — 84 items
APL SKNCLS STERI-STRIP NONHPOA (GAUZE/BANDAGES/DRESSINGS)
BAG SPEC THK2 15X12 ZIP CLS (MISCELLANEOUS) ×1
BAG ZIPLOCK 12X15 (MISCELLANEOUS) ×3 IMPLANT
BANDAGE ELASTIC 6 VELCRO ST LF (GAUZE/BANDAGES/DRESSINGS) ×3 IMPLANT
BANDAGE ESMARK 6X9 LF (GAUZE/BANDAGES/DRESSINGS) ×1 IMPLANT
BASEPLATE TIBIAL SZ4 LEFT KNEE (Plate) ×2 IMPLANT
BENZOIN TINCTURE PRP APPL 2/3 (GAUZE/BANDAGES/DRESSINGS) IMPLANT
BIT DRILL CALIBRATED 4.3MMX365 (DRILL) IMPLANT
BLADE SAG 18X100X1.27 (BLADE) ×3 IMPLANT
BNDG CMPR 9X6 STRL LF SNTH (GAUZE/BANDAGES/DRESSINGS) ×1
BNDG ESMARK 6X9 LF (GAUZE/BANDAGES/DRESSINGS) ×3
BONE CHIP PRESERV 30CC PCAN30 (Bone Implant) ×3 IMPLANT
BSPLAT TIB 4 CMNT REV F TPR KN (Plate) ×1 IMPLANT
CEMENT BONE 1-PACK (Cement) ×6 IMPLANT
CEMENT RESTRICTOR DEPUY SZ 3 (Cement) ×2 IMPLANT
CLOSURE WOUND 1/2 X4 (GAUZE/BANDAGES/DRESSINGS)
COMP FEMORAL CONST SZ5 LT KNEE (Knees) ×3 IMPLANT
COMPONENT FEMRL CONST SZ5LT KN (Knees) IMPLANT
CUFF TOURN SGL QUICK 34 (TOURNIQUET CUFF) ×3
CUFF TRNQT CYL 34X4X40X1 (TOURNIQUET CUFF) ×1 IMPLANT
DRAPE C-ARM 42X120 X-RAY (DRAPES) ×2 IMPLANT
DRAPE EXTREMITY T 121X128X90 (DRAPE) ×3 IMPLANT
DRAPE POUCH INSTRU U-SHP 10X18 (DRAPES) ×3 IMPLANT
DRAPE STERI IOBAN 125X83 (DRAPES) ×3 IMPLANT
DRAPE U-SHAPE 47X51 STRL (DRAPES) ×3 IMPLANT
DRILL CALIBRATED 4.3MMX365 (DRILL) ×3
DRSG PAD ABDOMINAL 8X10 ST (GAUZE/BANDAGES/DRESSINGS) ×3 IMPLANT
DURAPREP 26ML APPLICATOR (WOUND CARE) ×3 IMPLANT
ELECT REM PT RETURN 9FT ADLT (ELECTROSURGICAL) ×3
ELECTRODE REM PT RTRN 9FT ADLT (ELECTROSURGICAL) ×1 IMPLANT
EVACUATOR 1/8 PVC DRAIN (DRAIN) IMPLANT
FACESHIELD WRAPAROUND (MASK) ×15 IMPLANT
FACESHIELD WRAPAROUND OR TEAM (MASK) ×5 IMPLANT
GAUZE SPONGE 4X4 12PLY STRL (GAUZE/BANDAGES/DRESSINGS) ×3 IMPLANT
GAUZE XEROFORM 5X9 LF (GAUZE/BANDAGES/DRESSINGS) ×2 IMPLANT
GLOVE BIO SURGEON STRL SZ7.5 (GLOVE) ×3 IMPLANT
GLOVE BIOGEL PI IND STRL 8 (GLOVE) ×2 IMPLANT
GLOVE BIOGEL PI INDICATOR 8 (GLOVE) ×4
GLOVE ECLIPSE 8.0 STRL XLNG CF (GLOVE) ×3 IMPLANT
GOWN STRL REUS W/TWL XL LVL3 (GOWN DISPOSABLE) ×6 IMPLANT
GRAFT BNE CANC CHIPS 1-8 30CC (Bone Implant) IMPLANT
HANDPIECE INTERPULSE COAX TIP (DISPOSABLE) ×3
IMMOBILIZER KNEE 20 (SOFTGOODS) ×5 IMPLANT
IMMOBILIZER KNEE 20 THIGH 36 (SOFTGOODS) ×1 IMPLANT
INSERT SIZE 3-4 15MM KNEE (Insert) ×2 IMPLANT
KIT BASIN OR (CUSTOM PROCEDURE TRAY) ×3 IMPLANT
LEGION DISTAL SIZE 5 5MM KNEE (Orthopedic Implant) ×2 IMPLANT
MANIFOLD NEPTUNE II (INSTRUMENTS) ×2 IMPLANT
NS IRRIG 1000ML POUR BTL (IV SOLUTION) ×3 IMPLANT
PACK TOTAL JOINT (CUSTOM PROCEDURE TRAY) ×3 IMPLANT
PAD ABD 8X10 STRL (GAUZE/BANDAGES/DRESSINGS) ×2 IMPLANT
PADDING CAST COTTON 6X4 STRL (CAST SUPPLIES) ×4 IMPLANT
PATELLA 32MM (Knees) ×2 IMPLANT
PEN SKIN MARKING BROAD (MISCELLANEOUS) ×3 IMPLANT
PIN TROCAR 3 INCH (PIN) ×2 IMPLANT
PIN TROCAR 5 (PIN) ×2 IMPLANT
POSITIONER SURGICAL ARM (MISCELLANEOUS) ×3 IMPLANT
SET HNDPC FAN SPRY TIP SCT (DISPOSABLE) ×1 IMPLANT
SET PAD KNEE POSITIONER (MISCELLANEOUS) ×3 IMPLANT
SPONGE LAP 18X18 X RAY DECT (DISPOSABLE) ×4 IMPLANT
STAPLER VISISTAT 35W (STAPLE) ×2 IMPLANT
STEM CEMENT LEGION 14X160 KNEE (Orthopedic Implant) ×2 IMPLANT
STEM CEMENTED KNEE (Knees) ×2 IMPLANT
STRIP CLOSURE SKIN 1/2X4 (GAUZE/BANDAGES/DRESSINGS) IMPLANT
SUCTION FRAZIER 12FR DISP (SUCTIONS) ×3 IMPLANT
SUT MNCRL AB 4-0 PS2 18 (SUTURE) IMPLANT
SUT VIC AB 0 CT1 36 (SUTURE) ×5 IMPLANT
SUT VIC AB 1 CT1 27 (SUTURE) ×3
SUT VIC AB 1 CT1 27XBRD ANTBC (SUTURE) ×1 IMPLANT
SUT VIC AB 1 CT1 36 (SUTURE) ×8 IMPLANT
SUT VIC AB 2-0 CT1 27 (SUTURE) ×6
SUT VIC AB 2-0 CT1 TAPERPNT 27 (SUTURE) ×2 IMPLANT
SWAB COLLECTION DEVICE MRSA (MISCELLANEOUS) IMPLANT
TOWEL OR 17X26 10 PK STRL BLUE (TOWEL DISPOSABLE) ×6 IMPLANT
TOWEL OR NON WOVEN STRL DISP B (DISPOSABLE) IMPLANT
TOWER CARTRIDGE SMART MIX (DISPOSABLE) IMPLANT
TRAY FOLEY W/METER SILVER 14FR (SET/KITS/TRAYS/PACK) ×3 IMPLANT
TRAY FOLEY W/METER SILVER 16FR (SET/KITS/TRAYS/PACK) ×3 IMPLANT
TUBE ANAEROBIC SPECIMEN COL (MISCELLANEOUS) IMPLANT
TUBE KAMVAC SUCTION (TUBING) IMPLANT
WATER STERILE IRR 1500ML POUR (IV SOLUTION) ×3 IMPLANT
WEDGE SCREW-ON  5MMX5MM KNEE (Orthopedic Implant) ×2 IMPLANT
WRAP KNEE MAXI GEL POST OP (GAUZE/BANDAGES/DRESSINGS) ×2 IMPLANT
YANKAUER SUCT BULB TIP 10FT TU (MISCELLANEOUS) ×3 IMPLANT

## 2015-07-19 NOTE — Progress Notes (Signed)
Inpatient Diabetes Program Recommendations  AACE/ADA: New Consensus Statement on Inpatient Glycemic Control (2015)  Target Ranges:  Prepandial:   less than 140 mg/dL      Peak postprandial:   less than 180 mg/dL (1-2 hours)      Critically ill patients:  140 - 180 mg/dL   Review of Glycemic Control  Diabetes history: DM2 Outpatient Diabetes medications: Levemir 50 units in am and 40 units QHS Current orders for Inpatient glycemic control: Levemir 50 in am and 40 in pm, metformin 500 mg bid, Novolog resistant tidwc and hs + 15-25 units tidwc for meal coverage  69 yo female with the history of a left periprosthetic femur fracture admitted for left total knee replacement. Checks blood sugars at home and states rarely has hypoglycemia. Last Levemir 20 units 10/12 pm (1/2 home dose prior to surgery this am)  Results for [name], [name] (MRN [id number]) as of 07/19/2015 17:37  Ref. Range 07/19/2015 10:06 07/19/2015 11:48 07/19/2015 11:50 07/19/2015 14:02 07/19/2015 16:45  Glucose-Capillary Latest Ref Range: 65-99 mg/dL 254 (H) 257 (H) 247 (H) 389 (H) 286 (H)   Needs basal insulin. MD ordered 1/2 home dose Levemir.  Inpatient Diabetes Program Recommendations:   Insulin - Basal: Levemir 25 units in am and 20 units QHS Correction (SSI): Novolog resistant tidwc and hs Insulin - Meal Coverage: Per home dose when pt is eating well Oral Agents: metformin 500 mg bid HgbA1C: 8.1% - needs tighter control Diet: When advanced, CHO mod med   Will follow while inpatient. Discussed with RN. Thank you. Lorenda Peck, RD, LDN, CDE Inpatient Diabetes Coordinator [phone]

## 2015-07-19 NOTE — Transfer of Care (Signed)
Immediate Anesthesia Transfer of Care Note  Patient: [name]  Procedure(s) Performed: Procedure(s): Removal Intramedullary Rod and Screws Left Knee/Femur, Revision arthroplasty Left knee (Left) HARDWARE REMOVAL (Left)  Patient Location: PACU  Anesthesia Type:General  Level of Consciousness: awake, alert  and oriented  Airway & Oxygen Therapy: Patient Spontanous Breathing and Patient connected to face mask oxygen  Post-op Assessment: Report given to RN and Post -op Vital signs reviewed and stable  Post vital signs: Reviewed and stable  Last Vitals:  Filed Vitals:   07/19/15 1334  BP: 207/91  Pulse: 110  Temp:   Resp: 23    Complications: No apparent anesthesia complications

## 2015-07-19 NOTE — Anesthesia Preprocedure Evaluation (Addendum)
Anesthesia Evaluation  Patient identified by MRN, date of birth, ID band Patient awake    Reviewed: Allergy & Precautions, NPO status , Patient's Chart, lab work & pertinent test results  Airway Mallampati: II  TM Distance: >3 FB Neck ROM: Full    Dental no notable dental hx.    Pulmonary COPD,  oxygen dependent, Current Smoker,  PNEUMONECTOMY    + decreased breath sounds+ wheezing      Cardiovascular hypertension, + CAD and + Cardiac Stents  Normal cardiovascular exam Rhythm:Regular Rate:Normal     Neuro/Psych negative neurological ROS  negative psych ROS   GI/Hepatic negative GI ROS, Neg liver ROS,   Endo/Other  diabetesMorbid obesity  Renal/GU negative Renal ROS  negative genitourinary   Musculoskeletal negative musculoskeletal ROS (+)   Abdominal (+) + obese,   Peds negative pediatric ROS (+)  Hematology negative hematology ROS (+)   Anesthesia Other Findings   Reproductive/Obstetrics negative OB ROS                            Anesthesia Physical Anesthesia Plan  ASA: IV  Anesthesia Plan: General   Post-op Pain Management:    Induction: Intravenous  Airway Management Planned: Oral ETT  Additional Equipment:   Intra-op Plan:   Post-operative Plan: Possible Post-op intubation/ventilation  Informed Consent: I have reviewed the patients History and Physical, chart, labs and discussed the procedure including the risks, benefits and alternatives for the proposed anesthesia with the patient or authorized representative who has indicated his/her understanding and acceptance.   Dental advisory given  Plan Discussed with: CRNA and Surgeon  Anesthesia Plan Comments: (preop nebulizer )       Anesthesia Quick Evaluation

## 2015-07-19 NOTE — H&P (Signed)
[name] is an 69 y.o. female.   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  The knee was replaced many years ago by another MD in town.  She then sustained a mechanical fall a year ago and another MD treated the fracture appropriately with an IM rod.  However, she has been unable to heal her fracture.  The nail was even dynamized earlier this year with the proximal interlocks removed.  She still continues to have pain and radiographic evidence of a non-union.  She is a diabetic and a smoker which does contribute to her lack of healing and this has been discussed with her.  Past Medical History  Diagnosis Date  . Asthma   . Cholelithiasis   . Colon polyps   . Type II diabetes mellitus ([place])   . GERD (gastroesophageal reflux disease)   . Hyperlipidemia   . Tobacco abuse   . Arthritis   . COPD (chronic obstructive pulmonary disease) ([facility])     USES O2 AT NIGHT + PRN IN DAYTIME3L  . Emphysema   . H/O hiatal hernia   . Hypertension   . Fibromyalgia   . Lung cancer ([place]) 2010    Adenocarcinoma, right lung, node positive  . On home oxygen therapy     "2-4L at night and prn" (07/18/2104)  . Chronic bronchitis ([place])   . FH: chemotherapy 2010     had 4 times  . Radiation 6/10    28 times right lung, and 5 treatments to sternum  . Oxygen dependent     at night  3liters  . Coronary artery disease     a. 2005 Cath/PCI: mRCA-> Cypher DES;  b. 06/2014 Cath/PCI: EF 60%. LM nl, LAD min irregs, D1 40, LCX 20p, OM2 95 (2.5x14 Resolute DES), RCA 30p, 30 ISR, 20d, PDA/PLA nl.  . Heart murmur   . Peripheral vascular disease ([place])   . Shortness of breath dyspnea     on exertion  . Pneumonia     hx of   . Stress incontinence   . Complication of anesthesia     Difficulty waking up at times from anesthesia    Past Surgical History  Procedure Laterality Date   . Appendectomy  1970  . Gallbladder surgery  1989  . Abdominal hysterectomy  1972  . Tonsillectomy  1966  . Carpal tunnel release    . Replacement total knee  2001    left  . Neck surgery  2005  . Pneumonectomy      Right Partial, lung cancer confirmed, nod positive  . Right upper lobectomy with lymph node dissection  10/12/2008    Dr Arlyce Dice  . Coronary angioplasty  2005  . Portacath placement  12/12/2007  . Port-a-cath removal  05/12/2012    Procedure: REMOVAL PORT-A-CATH;  Surgeon: Melrose Nakayama, MD;  Location: [place];  Service: Thoracic;  Laterality: Left;  [name] Eye surgery      implants 02/18/12 (Left) 02/25/12 (right eye)  . Rotator cuff repair      RIGHT SHOULDER  2 YRS  . Orif humerus fracture Left 12/14/2012    Procedure: OPEN REDUCTION INTERNAL FIXATION (ORIF) LEFT PROXIMAL HUMERUS FRACTURE;  Surgeon: Mcarthur Rossetti, MD;  Location: [place];  Service: Orthopedics;  Laterality: Left;  . Abdominal hysterectomy    . Joint replacement    .  Femur im nail Left 07/19/2014    Procedure: INTRAMEDULLARY (IM) NAIL FEMORAL;  Surgeon: Alta Corning, MD;  Location: [place];  Service: Orthopedics;  Laterality: Left;  . Left heart catheterization with coronary angiogram N/A 06/20/2014    Procedure: LEFT HEART CATHETERIZATION WITH CORONARY ANGIOGRAM;  Surgeon: Troy Sine, MD;  Location: [facility] CATH LAB;  Service: Cardiovascular;  Laterality: N/A;  . Back surgery  2007  . Arm fracture Left 3/14  . Femur fracture surgery Left     10/15  . Refractive surgery Bilateral 2/14    Family History  Problem Relation Age of Onset  . Diabetes Mother   . Heart failure Mother   . Diabetes Father   . CAD Father 37  . Arthritis    . Lung disease    . Cancer    . Asthma     Social History:  reports that she has been smoking Cigarettes.  She has a 53 pack-year smoking history. She has never used smokeless tobacco. She reports that she does not drink alcohol or use illicit drugs.  Allergies:   Allergies  Allergen Reactions  . Percocet [Oxycodone-Acetaminophen] Shortness Of Breath  . Celebrex [Celecoxib] Palpitations and Other (See Comments)    Chest pain, tachycardia, diaphoresis  . Niacin Rash  . Varenicline Tartrate Rash and Other (See Comments)    CHANTIX    Medications Prior to Admission  Medication Sig Dispense Refill  . acetaminophen (TYLENOL) 500 MG tablet Take 1,000 mg by mouth every 6 (six) hours as needed.    [name] aspirin EC 81 MG EC tablet Take 1 tablet (81 mg total) by mouth daily. (Patient taking differently: Take 81 mg by mouth 2 (two) times daily at 8 am and 10 pm. )    . atorvastatin (LIPITOR) 40 MG tablet TAKE ONE TABLET BY MOUTH ONCE DAILY AT  6:00  PM 30 tablet 0  . calcium carbonate (TUMS - DOSED IN MG ELEMENTAL CALCIUM) 500 MG chewable tablet Chew 3 tablets by mouth daily as needed for indigestion or heartburn.    . Cholecalciferol (VITAMIN D) 1000 UNITS capsule Take 1,000 Units by mouth daily.     . clopidogrel (PLAVIX) 75 MG tablet Take 1 tablet (75 mg total) by mouth daily with breakfast. 30 tablet 11  . esomeprazole (NEXIUM) 40 MG capsule Take 40 mg by mouth as needed (for heartburm/ acid reflux).     . furosemide (LASIX) 20 MG tablet Take 20 mg by mouth daily as needed for edema.    [name] HYDROcodone-acetaminophen (NORCO/VICODIN) 5-325 MG per tablet Take 1-2 tablets by mouth every 6 (six) hours as needed for moderate pain. 240 tablet 0  . ibuprofen (ADVIL,MOTRIN) 200 MG tablet Take 600 mg by mouth every 4 (four) hours as needed (pain).     . insulin aspart (NOVOLOG) 100 UNIT/ML injection Inject 15-25 Units into the skin 3 (three) times daily before meals. Units given depends on her CBG readings. < 200 gives 15 units, > 200 gives 20 units, 20-25 units at supper depending on CBG reading    . Insulin Detemir (LEVEMIR FLEXTOUCH) 100 UNIT/ML Pen Inject 40 Units into the skin 2 (two) times daily. (Patient taking differently: Inject 40-50 Units into the skin 2 (two)  times daily. Inject 50 units subque in the morning and inject 40 units at night)    . Ipratropium-Albuterol (COMBIVENT RESPIMAT) 20-100 MCG/ACT AERS respimat Inhale 1 puff into the lungs 2 (two) times daily as needed for wheezing.    [name]  lisinopril (PRINIVIL,ZESTRIL) 10 MG tablet TAKE ONE TABLET BY MOUTH TWICE DAILY (Patient taking differently: TAKE 10 MG BY MOUTH DAILY) 60 tablet 6  . lovastatin (MEVACOR) 40 MG tablet Take 40 mg by mouth daily after supper.    . metFORMIN (GLUCOPHAGE) 500 MG tablet Take 500 mg by mouth 2 (two) times daily with a meal.     . metoprolol tartrate (LOPRESSOR) 25 MG tablet TAKE ONE TABLET BY MOUTH TWICE DAILY (Patient taking differently: TAKE ONE TABLET BY MOUTH IN THE MORNING AND 1/2 TABLET AT NIGHT) 60 tablet 0  . nicotine (NICODERM CQ - DOSED IN MG/24 HOURS) 21 mg/24hr patch Place 21 mg onto the skin daily.    . OXYGEN Inhale 2-4 L into the lungs at bedtime as needed.    Vladimir Faster Glycol-Propyl Glycol (SYSTANE) 0.4-0.3 % SOLN Place 1 drop into both eyes 2 (two) times daily.     . pregabalin (LYRICA) 50 MG capsule Take 50 mg by mouth at bedtime.    . pregabalin (LYRICA) 75 MG capsule Take 75 mg by mouth every morning.    . Tiotropium Bromide Monohydrate (SPIRIVA RESPIMAT) 2.5 MCG/ACT AERS Inhale 2 puffs into the lungs daily.    . nitroGLYCERIN (NITROSTAT) 0.4 MG SL tablet Place 1 tablet (0.4 mg total) under the tongue every 5 (five) minutes x 3 doses as needed for chest pain. 25 tablet 2    Results for orders placed or performed during the hospital encounter of 07/19/15 (from the past 48 hour(s))  Glucose, capillary     Status: Abnormal   Collection Time: 07/19/15  7:21 AM  Result Value Ref Range   Glucose-Capillary 215 (H) 65 - 99 mg/dL   Comment 1 Notify RN    No results found.  Review of Systems  Musculoskeletal: Positive for joint pain and falls.  All other systems reviewed and are negative.   Blood pressure 164/72, pulse 84, temperature 97.7 F (36.5  C), temperature source Oral, resp. rate 18, SpO2 96 %. Physical Exam  Constitutional: She is oriented to person, place, and time. She appears well-developed and well-nourished.  HENT:  Head: Normocephalic and atraumatic.  Eyes: EOM are normal. Pupils are equal, round, and reactive to light.  Neck: Normal range of motion. Neck supple.  Cardiovascular: Normal rate and regular rhythm.   Respiratory: Effort normal and breath sounds normal.  GI: Soft. Bowel sounds are normal.  Musculoskeletal:       Left knee: She exhibits swelling and bony tenderness. Tenderness found.  Neurological: She is alert and oriented to person, place, and time.  Skin: Skin is warm and dry.  Psychiatric: She has a normal mood and affect.     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 1)  Will proceed to the OR today for removal of the IM rod from her left knee and then revising the total knee to a long-stem revision knee and possibly supplimentation with plate and screws.  She fully understands the risks of blood loss, nerve injury, infection, and non-union as well as DVT.  Informed consent is obtained.  Janyia Guion Y 07/19/2015, 8:52 AM

## 2015-07-19 NOTE — Brief Op Note (Signed)
07/19/2015  1:02 PM  PATIENT:  [name]  69 y.o. 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 arthroplasty, Retained intramedullary rod and screws left femur/knee  POST-OPERATIVE DIAGNOSIS:  non-union left distal femur periprosthetic fracture around total  PROCEDURE:  Procedure(s): Removal Intramedullary Rod and Screws Left Knee/Femur, Revision arthroplasty Left knee (Left) HARDWARE REMOVAL (Left)  SURGEON:  Surgeon(s) and Role:    * Mcarthur Rossetti, MD - Primary  PHYSICIAN ASSISTANT: Benita Stabile, PA-C  ANESTHESIA:   general  EBL:  Total I/O In: 1000 [I.V.:1000] Out: 1200 [Urine:800; Blood:400]  BLOOD ADMINISTERED:none  DRAINS: none   LOCAL MEDICATIONS USED:  NONE  SPECIMEN:  No Specimen  DISPOSITION OF SPECIMEN:  N/A  COUNTS:  YES  TOURNIQUET:   Total Tourniquet Time Documented: Thigh (Left) - 119 minutes Total: Thigh (Left) - 119 minutes   DICTATION: .Other Dictation: Dictation Number [id number]  PLAN OF CARE: Admit to inpatient   PATIENT DISPOSITION:  PACU - hemodynamically stable.   Delay start of Pharmacological VTE agent (>24hrs) due to surgical blood loss or risk of bleeding: no

## 2015-07-19 NOTE — Anesthesia Procedure Notes (Addendum)
Procedure Name: Intubation Date/Time: 07/19/2015 9:31 AM Performed by: Chyrel Masson Pre-anesthesia Checklist: Patient identified, Emergency Drugs available, Suction available, Patient being monitored and Timeout performed Patient Re-evaluated:Patient Re-evaluated prior to inductionOxygen Delivery Method: Circle system utilized and Simple face mask Preoxygenation: Pre-oxygenation with 100% oxygen Intubation Type: IV induction Ventilation: Mask ventilation without difficulty Laryngoscope Size: 3 and Mac Grade View: Grade I Tube type: Oral Tube size: 7.5 mm Number of attempts: 1 Airway Equipment and Method: Stylet Placement Confirmation: ETT inserted through vocal cords under direct vision,  positive ETCO2 and breath sounds checked- equal and bilateral Secured at: 21 cm Tube secured with: Tape Dental Injury: Teeth and Oropharynx as per pre-operative assessment

## 2015-07-20 LAB — BASIC METABOLIC PANEL
ANION GAP: 5 (ref 5–15)
BUN: 17 mg/dL (ref 6–20)
CHLORIDE: 107 mmol/L (ref 101–111)
CO2: 26 mmol/L (ref 22–32)
CREATININE: 0.81 mg/dL (ref 0.44–1.00)
Calcium: 8.3 mg/dL — ABNORMAL LOW (ref 8.9–10.3)
GFR calc non Af Amer: >60 mL/min (ref >60)
Glucose, Bld: 265 mg/dL — ABNORMAL HIGH (ref 65–99)
Potassium: 4.2 mmol/L (ref 3.5–5.1)
SODIUM: 138 mmol/L (ref 135–145)

## 2015-07-20 LAB — CBC
HEMATOCRIT: 32.4 % — AB (ref 36.0–46.0)
HEMOGLOBIN: 10.5 g/dL — AB (ref 12.0–15.0)
MCH: 27.3 pg (ref 26.0–34.0)
MCHC: 32.4 g/dL (ref 30.0–36.0)
MCV: 84.2 fL (ref 78.0–100.0)
Platelets: 147 10*3/uL — ABNORMAL LOW (ref 150–400)
RBC: 3.85 MIL/uL — AB (ref 3.87–5.11)
RDW: 15.9 % — ABNORMAL HIGH (ref 11.5–15.5)
WBC: 12 10*3/uL — AB (ref 4.0–10.5)

## 2015-07-20 LAB — GLUCOSE, CAPILLARY
GLUCOSE-CAPILLARY: 247 mg/dL — AB (ref 65–99)
GLUCOSE-CAPILLARY: 173 mg/dL — AB (ref 65–99)
GLUCOSE-CAPILLARY: 139 mg/dL — AB (ref 65–99)
Glucose-Capillary: 119 mg/dL — ABNORMAL HIGH (ref 65–99)

## 2015-07-20 NOTE — Anesthesia Postprocedure Evaluation (Signed)
  Anesthesia Post-op Note  Patient: [name]  Procedure(s) Performed: Procedure(s) (LRB): Removal Intramedullary Rod and Screws Left Knee/Femur, Revision arthroplasty Left knee (Left) HARDWARE REMOVAL (Left)  Patient Location: PACU  Anesthesia Type: General  Level of Consciousness: awake and alert   Airway and Oxygen Therapy: Patient Spontanous Breathing  Post-op Pain: mild  Post-op Assessment: Post-op Vital signs reviewed, Patient's Cardiovascular Status Stable, Respiratory Function Stable, Patent Airway and No signs of Nausea or vomiting  Last Vitals:  Filed Vitals:   07/19/15 2124  BP: 125/50  Pulse: 113  Temp: 36.7 C  Resp: 16    Post-op Vital Signs: stable   Complications: No apparent anesthesia complications

## 2015-07-20 NOTE — Op Note (Signed)
[name], [name] NO.:  [id number]  MEDICAL RECORD NO.:  [id number]  LOCATION:  81                         FACILITY:  [facility]  PHYSICIAN:  Lind Guest. 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  PROCEDURE: 1. Removal of IM nail and screws, left femur. 2. Revision arthroplasty of left total knee to a revision knee system.  IMPLANTS:  Smith and Nephew left size 5 Oxinium constrained femur size with a 14 x 160 mm cemented stem, size 4 tibial revision base plate with a 10 x 010 mm cemented stem with a size 15 mm constrained polyethylene insert and a 32 mm patellar button.  SURGEON:  Lind Guest. Ninfa Linden, M.D.  ASSISTANT:  Rita Emery, PA-C  ANESTHESIA:  General.  ANTIBIOTICS:  2 g of IV Ancef.  BLOOD LOSS:  400 mL.  COMPLICATIONS:  None.  INDICATIONS:  Ms. [name] is a 69 year old, insulin-dependent diabetic, who is a smoker as well and 1 year ago, she sustained a left distal femur periprosthetic fracture.  Her knee replacement was placed about 12 years earlier.  Another orthopedic surgeon in the town appropriately took her to the operating room, and due to her sickly state and nature, placed a retrograde intramedullary nail through the femoral notch.  He locked this with 2 screws proximally and 3 screws distally.  Over the next several months, she continued to have pain and evidence radiographically of a nonunion and this was confirmed on CT scan.  He then removed the 2 proximal interlocks to hopefully get some dynamization of the  fracture.  Her diabetes continues to be under not great control and she smokes and it was eventually recommended that she undergo revision if she could get her health together.  She has not been able to do that, but now her pain is quite debilitating and the nonunion is quite evident.  She wished to proceed with surgery in light of the heightened risk of acute blood loss anemia, nerve and vessel injury, continued fracture, nonunion, and mainly infection of DVT.  The goals and risks of surgery were explained to her in great detail and she did wish to proceed.  DESCRIPTION OF PROCEDURE:  After informed consent was obtained, appropriate right knee was marked.  She was brought to the operating room, placed supine on the operating table.  General anesthesia was then obtained.  A nonsterile tourniquet was placed high up around her upper thigh on the left side and her left leg from the thigh to ankle was prepped and draped with DuraPrep and sterile drapes including sterile stockinette with the bed raised.  A DeMayo leg holder was utilized. Time-out was called.  She was identified as correct patient, correct left leg.  I then used an Esmarch to wrap the leg and tourniquet was inflated to 300 mm of pressure.  We then made a direct midline incision over the patella and carried this proximally and distally.  We dissected down the knee joint and carried out our medial parapatellar arthrotomy and definitely found an effusion, but no evidence of infection at all. It was therefore an obvious fracture nonunion.  We were able to remove most of the distal interlocks although 2 had broken off into the femur. We got all the screws out and then we were able to remove the IM nail. She definitely had an obvious nonunion of the fracture, it was well significant wear from the IM nail on her tibia post of the polyethylene liner and her patella.  The tibial component was loose and we knew we needed to proceed with  a revision arthroplasty and it was difficult as it would be.  We were able to remove all previous trial components. Once we were able to do this, we then reamed the canals, the femur, and the tibia to support the cemented stems.  Then we did her freshening cuts on the femur and the tibia.  Once we were able to do this, we chose our implants, a size 5 femur with a long stem and a size 4 tibia with a long stem.  We then trialed up to 15 mm thickness constrained polyethylene inserts and we felt that the knee was stable and surprisingly the fracture appeared to interdigitate and stabilize as well.  We then removed all trial components and irrigated the knee with normal saline solution using pulsatile lavage.  We took down as much of the fracture site as we could, we made sure we cemented the real [name] and Nephew size 4 revision tibial base plate with a 10 x 094 mm cemented stem, and then we went to the femur.  We placed our size 5 Oxinium constrained femur with a 14 x 160 stem as well as augmented wedges.  We then placed the real 15 mm polyethylene insert.  We put cement again around the stem in the canal and once the cement had hardened, we cemented the patellar button, it appeared the fracture was stable.  I thought about placing a lateral plate, but it did appear that with the intramedullary endosteal reaming that hopefully it would help with maintaining union.  I did take a little bit of fracture and placed some cancellous chips in it over the anterior femur.  We then closed the arthrotomy with interrupted #1 Vicryl suture followed by 0-Vicryl in the deep tissue, 2-0 Vicryl in the subcutaneous tissue, staples on the skin, and Xeroform with a well-padded sterile dressings applied.  She was then awakened, extubated, and taken to the recovery room in stable condition. All final counts were correct.  There were no complications noted.  Of note, Rita Emery, PA-C assisted during the entire  case and his assistance was crucial for facilitating all aspects of this case.     Lind Guest. Ninfa Linden, M.D.     CYB/MEDQ  D:  07/19/2015  T:  07/20/2015  Job:  [id number]

## 2015-07-20 NOTE — Progress Notes (Signed)
CSW received referral for New SNF.  CSW reviewed chart and noted that PT recommending Home Health PT.   No social work needs identified.  CSW signing off.   Alison Murray, MSW, LCSW Clinical Social Work Coverage for [employer], [place]

## 2015-07-20 NOTE — Care Management Note (Signed)
Case Management Note  Patient Details  Name: [name] MRN: [id number] Date of Birth: 30-Apr-1946  Subjective/Objective:       Removal of IM nail and screws, left femur, Revision arthroplasty of left total knee to a revision knee system             Action/Plan: Discharge planning, spoke with patient at bedside. Patient is requesting [facility] for [facility] services. Patient also commented that she had O2 at home that she uses at night. She recently had the equipment removed from her home because she had spoken with her [facility] [employer] rep who was going to help her get O2 through [facility]. Contacted [facility] for referral and assistance with O2. Patient has all DME, good family support.   Expected Discharge Date:                  Expected Discharge Plan:  [place]  In-House Referral:  NA  Discharge planning Services  CM Consult  Post Acute Care Choice:  Home Health Choice offered to:  Patient  DME Arranged:  Oxygen DME Agency:  [place]:  PT [facility] Agency:  [place]  Status of Service:  Completed, signed off  Medicare Important Message Given:    Date Medicare IM Given:    Medicare IM give by:    Date Additional Medicare IM Given:    Additional Medicare Important Message give by:     If discussed at [place] of Stay Meetings, dates discussed:    Additional Comments:  Guadalupe Maple, RN 07/20/2015, 2:35 PM

## 2015-07-20 NOTE — Evaluation (Addendum)
Occupational Therapy Evaluation Patient Details Name: [name] MRN: [id number] DOB: October 09, 1945 Today's Date: 07/20/2015    History of Present Illness Pt s/p revision L TKR with removal L femur IM nail following nonunion.  Pt wtih hx COPD, Lung Ca, CAD, PVD, partial R pnuemectomy and back surgery   Clinical Impression   This 69 year old female was admitted for the above.  She has 24/7 assistance at home, but currently needs mod +2 A for sit to stand for safety due to dizziness.  Will follow in acute setting with min guard level goals for toileting/standing grooming tasks.    Follow Up Recommendations  Supervision/Assistance - 24 hour    Equipment Recommendations  None recommended by OT    Recommendations for Other Services       Precautions / Restrictions Precautions Precautions: Fall Precaution Comments: soft BP. Monitor. Required Braces or Orthoses: Knee Immobilizer - Left Knee Immobilizer - Left: On at all times (until clarified by MD) Restrictions Weight Bearing Restrictions: Yes LLE Weight Bearing: Partial weight bearing LLE Partial Weight Bearing Percentage or Pounds: 50%      Mobility Bed Mobility Overal bed mobility: Needs Assistance Bed Mobility: Supine to Sit      Sit to supine: Mod assist   General bed mobility comments: Use of rails. VC's for technique and assist with mobilizing LLE to EOB.  Transfers Overall transfer level: Needs assistance Equipment used: Rolling walker (2 wheeled) Transfers: Sit to/from Stand Sit to Stand: Mod assist;+2 physical assistance;+2 safety/equipment;From elevated surface         General transfer comment: assist to rise and stabilize; cues for UE/LE placement    Balance     Sitting balance-Leahy Scale: Good       Standing balance-Leahy Scale: Poor                              ADL Overall ADL's : Needs assistance/impaired             Lower Body Bathing: Moderate assistance;+2 for  physical assistance;Sit to/from stand       Lower Body Dressing: Maximal assistance;+2 for physical assistance;Sit to/from stand                 General ADL Comments: pt is able to perform UB adls. She has assistance at home.  When asking about shower stall, she holds onto a bar and windowsill--she doesn't feel like she will be safe to back in.  Recommended she do a "dry-run" transfer with HHPT to ensure safety     Vision     Perception     Praxis      Pertinent Vitals/Pain Pain Assessment: 0-10 Pain Score: 8  Pain Location: L knee Pain Descriptors / Indicators: Aching Pain Intervention(s): Limited activity within patient's tolerance;Monitored during session;Premedicated before session;Repositioned;Ice applied     Hand Dominance Right   Extremity/Trunk Assessment Upper Extremity Assessment Upper Extremity Assessment: Overall WFL for tasks assessed      Cervical / Trunk Assessment Cervical / Trunk Assessment: Kyphotic   Communication Communication Communication: No difficulties   Cognition Arousal/Alertness: Awake/alert Behavior During Therapy: WFL for tasks assessed/performed Overall Cognitive Status: Within Functional Limits for tasks assessed                     General Comments       Exercises      Shoulder Instructions      Home  Living Family/patient expects to be discharged to:: Private residence Living Arrangements: Children Available Help at Discharge: Family Type of Home: House Home Access: [place]: One level     Bathroom Shower/Tub: Occupational psychologist: Rafael Capo: Shower seat;Bedside commode;Walker - 2 wheels;Wheelchair - manual   Additional Comments: Pt lives with mother, daughter and grandchildren. Ramp to enter.      Prior Functioning/Environment Level of Independence: Independent with assistive device(s);Needs assistance  Gait / Transfers Assistance Needed: Ltd  ambulation with RW ADL's / Homemaking Assistance Needed: Family assist        OT Diagnosis: Generalized weakness;Acute pain   OT Problem List: Decreased strength;Decreased activity tolerance;Pain;Decreased knowledge of use of DME or AE   OT Treatment/Interventions: Self-care/ADL training;DME and/or AE instruction;Therapeutic activities    OT Goals(Current goals can be found in the care plan section) Acute Rehab OT Goals Patient Stated Goal: to get moving better OT Goal Formulation: With patient Time For Goal Achievement: 07/27/15 Potential to Achieve Goals: Good ADL Goals Pt Will Perform Grooming: with supervision;standing Pt Will Transfer to Toilet: with min guard assist;ambulating;bedside commode Pt Will Perform Toileting - Clothing Manipulation and hygiene: with min guard assist;sit to/from stand  OT Frequency: Min 2X/week   Barriers to D/C:            Co-evaluation              End of Session    Activity Tolerance: Patient limited by fatigue Patient left: in bed;with call bell/phone within reach;with family/visitor present   Time: 2103-1281 OT Time Calculation (min): 23 min Charges:  OT General Charges $OT Visit: 1 Procedure OT Evaluation $Initial OT Evaluation Tier I: 1 Procedure G-Codes:    Cheryn Lundquist 2015-08-03, 2:09 PM Lesle Chris, OTR/L [phone] 08-03-2015

## 2015-07-20 NOTE — Evaluation (Signed)
Physical Therapy Evaluation Patient Details Name: [name] MRN: [id number] DOB: 09-11-1946 Today's Date: 07/20/2015   History of Present Illness  Pt s/p revision L TKR with removal L femur IM nail following nonunion.  Pt wtih hx COPD, Lung Ca, CAD, PVD, partial R pnuemectomy and back surgery  Clinical Impression  Pt admitted as above and presenting with functional mobility limitations 2* decreased L LE strength/ROM, post op pain, PWB status, and SOB with exertion.  Pt should progress to dc home with 24/7 family assist and HHPT follow up.    Follow Up Recommendations Home health PT    Equipment Recommendations  None recommended by PT    Recommendations for Other Services OT consult     Precautions / Restrictions Precautions Precautions: Fall Precaution Comments: soft BP. Monitor. Required Braces or Orthoses: Knee Immobilizer - Left Knee Immobilizer - Left: On at all times Restrictions Weight Bearing Restrictions: Yes LLE Weight Bearing: Partial weight bearing LLE Partial Weight Bearing Percentage or Pounds: 50%      Mobility  Bed Mobility Overal bed mobility: Needs Assistance Bed Mobility: Supine to Sit     Supine to sit: Min assist;Mod assist;HOB elevated     General bed mobility comments: Use of rails. VC's for technique and assist with mobilizing LLE to EOB.  Transfers Overall transfer level: Needs assistance Equipment used: Rolling walker (2 wheeled) Transfers: Sit to/from Stand Sit to Stand: Mod assist;+2 physical assistance;+2 safety/equipment;From elevated surface         General transfer comment: Min A to rise and for balance. VC's for technique and hand placement as pt PWB  Ambulation/Gait Ambulation/Gait assistance: Mod assist;+2 physical assistance;+2 safety/equipment Ambulation Distance (Feet): 6 Feet Assistive device: Rolling walker (2 wheeled) Gait Pattern/deviations: Step-to pattern;Decreased step length - right;Decreased step length -  left;Shuffle;Trunk flexed Gait velocity: decreased Gait velocity interpretation: Below normal speed for age/gender General Gait Details: Cues for sequence, posture, PWB and position from RW.  Pt ltd by c/o diziness - BP 106/51  Stairs            Wheelchair Mobility    Modified Rankin (Stroke Patients Only)       Balance     Sitting balance-Leahy Scale: Good       Standing balance-Leahy Scale: Poor                               Pertinent Vitals/Pain Pain Assessment: 0-10 Pain Score: 8  Pain Location: L knee Pain Descriptors / Indicators: Aching;Sore Pain Intervention(s): Limited activity within patient's tolerance;Monitored during session;Premedicated before session;Ice applied    Home Living Family/patient expects to be discharged to:: Private residence Living Arrangements: Children Available Help at Discharge: Family Type of Home: House Home Access: [place]: [place]: Environmental consultant - 2 wheels;Wheelchair - manual Additional Comments: Pt lives with mother, daughter and grandchildren. Ramp to enter.    Prior Function Level of Independence: Independent with assistive device(s);Needs assistance   Gait / Transfers Assistance Needed: Ltd ambulation with RW  ADL's / Homemaking Assistance Needed: Family assist        Hand Dominance   Dominant Hand: Right    Extremity/Trunk Assessment               Lower Extremity Assessment: LLE deficits/detail;Generalized weakness;RLE deficits/detail RLE Deficits / Details: Impaired light touch sensation distal to knee and into foot - premorbid. Strength WFL. LLE Deficits /  Details: Ankle AROM WFL. Other joints not assessed due to [place].  Cervical / Trunk Assessment: Kyphotic  Communication   Communication: No difficulties  Cognition Arousal/Alertness: Awake/alert Behavior During Therapy: WFL for tasks assessed/performed Overall Cognitive Status: Within Functional  Limits for tasks assessed                      General Comments      Exercises Total Joint Exercises Ankle Circles/Pumps: AROM;Both;15 reps;Supine      Assessment/Plan    PT Assessment Patient needs continued PT services  PT Diagnosis Difficulty walking   PT Problem List Decreased strength;Decreased range of motion;Decreased activity tolerance;Decreased mobility;Decreased balance;Decreased knowledge of use of DME;Obesity;Pain;Decreased knowledge of precautions  PT Treatment Interventions DME instruction;Gait training;Functional mobility training;Therapeutic activities;Therapeutic exercise;Balance training;Patient/family education   PT Goals (Current goals can be found in the Care Plan section) Acute Rehab PT Goals Patient Stated Goal: to get moving better PT Goal Formulation: With patient Time For Goal Achievement: 08/03/14 Potential to Achieve Goals: Good    Frequency 7X/week   Barriers to discharge        Co-evaluation               End of Session Equipment Utilized During Treatment: Gait belt;Oxygen;Left knee immobilizer Activity Tolerance: Patient limited by fatigue Patient left: in chair;with call bell/phone within reach Nurse Communication: Mobility status;Weight bearing status         Time: 0828-0902 PT Time Calculation (min) (ACUTE ONLY): 34 min   Charges:   PT Evaluation $Initial PT Evaluation Tier I: 1 Procedure PT Treatments $Gait Training: 8-22 mins   PT G Codes:        Mivaan Corbitt 07-27-15, 12:33 PM

## 2015-07-20 NOTE — Progress Notes (Signed)
Subjective: 1 Day Post-Op Procedure(s) (LRB): Removal Intramedullary Rod and Screws Left Knee/Femur, Revision arthroplasty Left knee (Left) HARDWARE REMOVAL (Left) Patient reports pain as moderate.    Objective: Vital signs in last 24 hours: Temp:  [97.8 F (36.6 C)-98.8 F (37.1 C)] 98.8 F (37.1 C) (10/14 0521) Pulse Rate:  [103-113] 103 (10/14 0918) Resp:  [10-23] 20 (10/14 0918) BP: (97-207)/(45-91) 97/45 mmHg (10/14 0521) SpO2:  [91 %-98 %] 91 % (10/14 0918) Weight:  [102.513 kg (226 lb)] 102.513 kg (226 lb) (10/13 1443)  Intake/Output from previous day: 10/13 0701 - 10/14 0700 In: 3090 [P.O.:240; I.V.:2600; IV Piggyback:250] Out: 2425 [Urine:2025; Blood:400] Intake/Output this shift: Total I/O In: 240 [P.O.:240] Out: -    Recent Labs  07/20/15 0510  HGB 10.5*    Recent Labs  07/20/15 0510  WBC 12.0*  RBC 3.85*  HCT 32.4*  PLT 147*    Recent Labs  07/20/15 0510  NA 138  K 4.2  CL 107  CO2 26  BUN 17  CREATININE 0.81  GLUCOSE 265*  CALCIUM 8.3*   No results for input(s): LABPT, INR in the last 72 hours.   Left leg Intact pulses distally Dorsiflexion/Plantar flexion intact Incision: dressing C/D/I Compartment soft  Assessment/Plan: 1 Day Post-Op Procedure(s) (LRB): Removal Intramedullary Rod and Screws Left Knee/Femur, Revision arthroplasty Left knee (Left) HARDWARE REMOVAL (Left) Up with therapy  Smoking cessation discussed with patient  Erskine Emery 07/20/2015, 9:56 AM

## 2015-07-20 NOTE — Progress Notes (Signed)
Have told patient several times today that the foley catheter needs to removed, and she kept saying "not right now". I just told patient that I had to remove the foley before I left because that was what the doctor wanted. She refused to let me remove it, saying she had urgency and there was no way she could keep getting up through the night. I told the patient there were other options such as the [facility] or a bedpan, and she said that I could take it out in the morning. I passed this info along to the night nurse who will encourage the patient to let us remove it tonight to comply with the order.

## 2015-07-20 NOTE — Progress Notes (Signed)
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  Pt wtih hx COPD, Lung Ca, CAD, PVD, partial R pnuemectomy and back surgery    PT Comments    Marked improvement in activity tolerance vs this am.    Follow Up Recommendations  Home health PT     Equipment Recommendations  None recommended by PT    Recommendations for Other Services OT consult     Precautions / Restrictions Precautions Precautions: Fall Precaution Comments: soft BP. Monitor. Required Braces or Orthoses: Knee Immobilizer - Left Knee Immobilizer - Left: On at all times Restrictions Weight Bearing Restrictions: Yes LLE Weight Bearing: Partial weight bearing LLE Partial Weight Bearing Percentage or Pounds: 50%    Mobility  Bed Mobility Overal bed mobility: Needs Assistance Bed Mobility: Supine to Sit;Sit to Supine     Supine to sit: Min assist Sit to supine: Min assist;Mod assist   General bed mobility comments: Use of rails. VC's for technique and assist with mobilizing LLE  Transfers Overall transfer level: Needs assistance Equipment used: Rolling walker (2 wheeled) Transfers: Sit to/from Stand Sit to Stand: Min assist;Mod assist         General transfer comment: assist to rise and stabilize; cues for UE/LE placement  Ambulation/Gait Ambulation/Gait assistance: Min assist;+2 safety/equipment Ambulation Distance (Feet): 40 Feet Assistive device: Rolling walker (2 wheeled) Gait Pattern/deviations: Step-to pattern;Decreased step length - right;Decreased step length - left;Shuffle;Trunk flexed Gait velocity: decreased   General Gait Details: Cues for sequence, posture, PWB and position from RW.  Pt ltd by c/o diziness - BP 106/51   Stairs            Wheelchair Mobility    Modified Rankin (Stroke Patients Only)        Balance                                    Cognition Arousal/Alertness: Awake/alert Behavior During Therapy: WFL for tasks assessed/performed Overall Cognitive Status: Within Functional Limits for tasks assessed                      Exercises      General Comments        Pertinent Vitals/Pain Pain Assessment: 0-10 Pain Score: 6  Pain Location: L knee Pain Descriptors / Indicators: Aching;Sore Pain Intervention(s): Limited activity within patient's tolerance;Monitored during session;Premedicated before session (Pt declines ice pack)    Home Living Family/patient expects to be discharged to:: Private residence Living Arrangements: Children Available Help at Discharge: Family Type of Home: House       Home Equipment: Shower seat;Bedside commode;Walker - 2 wheels;Wheelchair - manual      Prior Function Level of Independence: Independent with assistive device(s);Needs assistance  Gait / Transfers Assistance Needed: Ltd ambulation with RW ADL's / Homemaking Assistance Needed: Family assist     PT Goals (current goals can now be found in the care plan section) Acute Rehab PT Goals Patient Stated Goal: to get moving better PT Goal Formulation: With patient Time For Goal Achievement: 08/03/14 Potential to Achieve Goals: Good Progress towards PT goals: Progressing toward goals    Frequency  7X/week    PT Plan Current plan remains appropriate    Co-evaluation  End of Session Equipment Utilized During Treatment: Gait belt;Oxygen;Left knee immobilizer Activity Tolerance: Patient limited by fatigue Patient left: in bed;with call bell/phone within reach;with family/visitor present     Time: 1447-1510 PT Time Calculation (min) (ACUTE ONLY): 23 min  Charges:  $Gait Training: 23-37 mins                    G Codes:      [name] 08/09/2015, 4:34 PM

## 2015-07-20 NOTE — Progress Notes (Signed)
  Results for DINORA, HEMM (MRN [id number]) as of 07/20/2015 08:48  Ref. Range 07/19/2015 11:50 07/19/2015 14:02 07/19/2015 16:45 07/19/2015 21:40 07/20/2015 07:41  Glucose-Capillary Latest Ref Range: 65-99 mg/dL 247 (H) 389 (H) 286 (H) 280 (H) 247 (H)  Results for LUCCA, GREGGS (MRN [id number]) as of 07/20/2015 08:48  Ref. Range 07/20/2015 05:10  Glucose Latest Ref Range: 65-99 mg/dL 265 (H)   Pt eating 100%. Increase Lantus to home dose of 50 units in am and 40 units QHS Add Novolog 12 units tidwc for meal coverage if pt eats > 50% meal.  Will continue to follow. Thank you. Lorenda Peck, RD, LDN, CDE Inpatient Diabetes Coordinator [phone]

## 2015-07-21 LAB — CBC
HEMATOCRIT: 28.9 % — AB (ref 36.0–46.0)
HEMOGLOBIN: 9 g/dL — AB (ref 12.0–15.0)
MCH: 26.4 pg (ref 26.0–34.0)
MCHC: 31.1 g/dL (ref 30.0–36.0)
MCV: 84.8 fL (ref 78.0–100.0)
Platelets: 113 10*3/uL — ABNORMAL LOW (ref 150–400)
RBC: 3.41 MIL/uL — AB (ref 3.87–5.11)
RDW: 15.9 % — ABNORMAL HIGH (ref 11.5–15.5)
WBC: 8.4 10*3/uL (ref 4.0–10.5)

## 2015-07-21 LAB — GLUCOSE, CAPILLARY
GLUCOSE-CAPILLARY: 201 mg/dL — AB (ref 65–99)
GLUCOSE-CAPILLARY: 239 mg/dL — AB (ref 65–99)
Glucose-Capillary: 218 mg/dL — ABNORMAL HIGH (ref 65–99)

## 2015-07-21 NOTE — Progress Notes (Signed)
PT Cancellation Note  Patient Details Name: [name] MRN: [id number] DOB: 11-13-1945   Cancelled Treatment:     PT pm session deferred at pt request 2* fatigue.  Will follow in am.   Dayzha Pogosyan 07/21/2015, 4:07 PM

## 2015-07-21 NOTE — Progress Notes (Signed)
Subjective: 2 Days Post-Op Procedure(s) (LRB): Removal Intramedullary Rod and Screws Left Knee/Femur, Revision arthroplasty Left knee (Left) HARDWARE REMOVAL (Left) Patient reports pain as moderate.  Acute blood loss anemia from surgery - contiuing to monitor urine output and vitals.  Objective: Vital signs in last 24 hours: Temp:  [98 F (36.7 C)-98.6 F (37 C)] 98.2 F (36.8 C) (10/15 0455) Pulse Rate:  [91-105] 95 (10/15 0739) Resp:  [16-18] 18 (10/15 0739) BP: (93-112)/(43-68) 112/50 mmHg (10/15 0455) SpO2:  [89 %-94 %] 93 % (10/15 0739)  Intake/Output from previous day: 10/14 0701 - 10/15 0700 In: 2525 [P.O.:720; I.V.:1805] Out: 1575 [Urine:1575] Intake/Output this shift:     Recent Labs  07/20/15 0510 07/21/15 0430  HGB 10.5* 9.0*    Recent Labs  07/20/15 0510 07/21/15 0430  WBC 12.0* 8.4  RBC 3.85* 3.41*  HCT 32.4* 28.9*  PLT 147* 113*    Recent Labs  07/20/15 0510  NA 138  K 4.2  CL 107  CO2 26  BUN 17  CREATININE 0.81  GLUCOSE 265*  CALCIUM 8.3*   No results for input(s): LABPT, INR in the last 72 hours.  Sensation intact distally Intact pulses distally Dorsiflexion/Plantar flexion intact Incision: scant drainage No cellulitis present Compartment soft  Assessment/Plan: 2 Days Post-Op Procedure(s) (LRB): Removal Intramedullary Rod and Screws Left Knee/Femur, Revision arthroplasty Left knee (Left) HARDWARE REMOVAL (Left) Up with therapy - 50% weight only left leg and only active knee motion by patient  Mcarthur Rossetti 07/21/2015, 9:54 AM

## 2015-07-21 NOTE — Progress Notes (Signed)
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  Pt wtih hx COPD, Lung Ca, CAD, PVD, partial R pnuemectomy and back surgery    PT Comments    Pt continues cooperative and motivated but ltd by increasing nausea and dizziness with attempts at mobility.  Follow Up Recommendations  Home health PT     Equipment Recommendations  None recommended by PT    Recommendations for Other Services OT consult     Precautions / Restrictions Precautions Precautions: Fall Required Braces or Orthoses: Knee Immobilizer - Left Knee Immobilizer - Left: On at all times Restrictions Weight Bearing Restrictions: Yes LLE Weight Bearing: Partial weight bearing LLE Partial Weight Bearing Percentage or Pounds: 50 Other Position/Activity Restrictions: No PT intervention for ROM per Dr Ninfa Linden.  Pt allowed to move to tolerance    Mobility  Bed Mobility Overal bed mobility: Needs Assistance Bed Mobility: Sit to Supine       Sit to supine: Min assist;Mod assist   General bed mobility comments: Use of rails. VC's for technique and assist with mobilizing LLE  Transfers Overall transfer level: Needs assistance Equipment used: Rolling walker (2 wheeled) Transfers: Sit to/from Stand Sit to Stand: Min assist;Mod assist;+2 physical assistance         General transfer comment: assist to rise and stabilize; cues for UE/LE placement  Ambulation/Gait Ambulation/Gait assistance: Min assist;Mod assist;+2 physical assistance;+2 safety/equipment Ambulation Distance (Feet): 5 Feet Assistive device: Rolling walker (2 wheeled) Gait Pattern/deviations: Step-to pattern;Decreased step length - right;Decreased step length - left;Shuffle;Trunk flexed Gait velocity: decreased   General Gait Details: Cues for sequence, posture, PWB and  position from RW.  Pt ltd by nausea and dizziness with movement   Stairs            Wheelchair Mobility    Modified Rankin (Stroke Patients Only)       Balance     Sitting balance-Leahy Scale: Good       Standing balance-Leahy Scale: Poor                      Cognition Arousal/Alertness: Awake/alert Behavior During Therapy: WFL for tasks assessed/performed Overall Cognitive Status: Within Functional Limits for tasks assessed                      Exercises      General Comments        Pertinent Vitals/Pain Pain Assessment: 0-10 Pain Score: 6  Pain Location: L knee Pain Descriptors / Indicators: Aching;Sore Pain Intervention(s): Limited activity within patient's tolerance;Monitored during session;Premedicated before session;Ice applied    Home Living                      Prior Function            PT Goals (current goals can now be found in the care plan section) Acute Rehab PT Goals Patient Stated Goal: to get moving better PT Goal Formulation: With patient Time For Goal Achievement: 08/03/14 Potential to Achieve Goals: Good Progress towards PT goals: Not progressing toward goals - comment (ltd by nausea)    Frequency  7X/week    PT Plan Current plan remains appropriate    Co-evaluation             End of Session Equipment Utilized  During Treatment: Gait belt;Oxygen;Left knee immobilizer Activity Tolerance: Patient limited by fatigue Patient left: in bed;with call bell/phone within reach;with family/visitor present     Time: 5974-1638 PT Time Calculation (min) (ACUTE ONLY): 17 min  Charges:  $Gait Training: 8-22 mins                    G Codes:      [name] August 17, 2015, 12:26 PM

## 2015-07-22 LAB — CBC
HCT: 26.5 % — ABNORMAL LOW (ref 36.0–46.0)
Hemoglobin: 8.5 g/dL — ABNORMAL LOW (ref 12.0–15.0)
MCH: 27.8 pg (ref 26.0–34.0)
MCHC: 32.1 g/dL (ref 30.0–36.0)
MCV: 86.6 fL (ref 78.0–100.0)
Platelets: 119 10*3/uL — ABNORMAL LOW (ref 150–400)
RBC: 3.06 MIL/uL — ABNORMAL LOW (ref 3.87–5.11)
RDW: 15.9 % — ABNORMAL HIGH (ref 11.5–15.5)
WBC: 7 10*3/uL (ref 4.0–10.5)

## 2015-07-22 LAB — GLUCOSE, CAPILLARY
GLUCOSE-CAPILLARY: 208 mg/dL — AB (ref 65–99)
GLUCOSE-CAPILLARY: 102 mg/dL — AB (ref 65–99)
GLUCOSE-CAPILLARY: 158 mg/dL — AB (ref 65–99)
Glucose-Capillary: 184 mg/dL — ABNORMAL HIGH (ref 65–99)
Glucose-Capillary: 138 mg/dL — ABNORMAL HIGH (ref 65–99)
Glucose-Capillary: 90 mg/dL (ref 65–99)
Glucose-Capillary: 98 mg/dL (ref 65–99)

## 2015-07-22 NOTE — Progress Notes (Signed)
Patient ID: [name], female   DOB: 1946-05-04, 69 y.o.   MRN: [id number] Did have a mechanical fall late last evening when getting into bed.  Left knee and leg are stable on exam.  No xrays needed.  Can continue PT at 50% weight only on left knee.  I spoke with her about SNF short=term, but she stresses that she can go home with family support.  Will discharge tomorrow.

## 2015-07-22 NOTE — Care Management Note (Signed)
Case Management Note  Patient Details  Name: [name] MRN: [id number] Date of Birth: May 06, 1946  Subjective/Objective:      Revision arthroplasty of left total knee to a revision knee system               Action/Plan: 07/21/2015 NCM spoke to pt. Pt requesting [facility] for HH. Message left for attending for oxygen orders. Pt will need qualifying sat for oxygen to be covered by insurance. NCM made unit RN aware. Waiting final recommendations for home.   Please see previous NCM notes.   Expected Discharge Date:  07/23/2015              Expected Discharge Plan:  [place]  In-House Referral:  NA  Discharge planning Services  CM Consult  Post Acute Care Choice:  Home Health Choice offered to:  Patient  DME Arranged:  Oxygen DME Agency:  [place]:  PT [facility] Agency:  [place]  Status of Service:  Completed, signed off  Medicare Important Message Given:    Date Medicare IM Given:    Medicare IM give by:    Date Additional Medicare IM Given:    Additional Medicare Important Message give by:     If discussed at [place] of Stay Meetings, dates discussed:    Additional Comments:  Erenest Rasher, RN 07/22/2015, 5:37 PM

## 2015-07-22 NOTE — Progress Notes (Signed)
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  Pt wtih hx COPD, Lung Ca, CAD, PVD, partial R pnuemectomy and back surgery    PT Comments    Pt cooperative this pm and willing to attempt OOB despite increased anxiety s/p fall.  Pt ltd by fatigue and c/o dizziness with activity - SaO2 maintained 90 - 94% on 3L O2.  Follow Up Recommendations  Home health PT     Equipment Recommendations  None recommended by PT    Recommendations for Other Services OT consult     Precautions / Restrictions Precautions Precautions: Fall Precaution Comments: AROM by pt only - NO ROM intervention by PT at this time Required Braces or Orthoses: Knee Immobilizer - Left Knee Immobilizer - Left: On at all times Restrictions Weight Bearing Restrictions: Yes LLE Weight Bearing: Partial weight bearing LLE Partial Weight Bearing Percentage or Pounds: 50    Mobility  Bed Mobility Overal bed mobility: Needs Assistance Bed Mobility: Supine to Sit;Sit to Supine     Supine to sit: Min assist;Mod assist Sit to supine: Min assist;Mod assist   General bed mobility comments: Use of rails. VC's for technique and assist with mobilizing LLE  Transfers Overall transfer level: Needs assistance Equipment used: Rolling walker (2 wheeled) Transfers: Sit to/from Stand Sit to Stand: Min assist;Mod assist;+2 physical assistance Stand pivot transfers: Min assist;Mod assist;+2 safety/equipment       General transfer comment: assist to rise and stabilize; cues for UE/LE placement  Ambulation/Gait Ambulation/Gait assistance: Min assist;Mod assist;+2 safety/equipment Ambulation Distance (Feet): 6 Feet Assistive device: Rolling walker (2 wheeled) Gait Pattern/deviations: Step-to pattern;Decreased step length - right;Decreased step length -  left;Shuffle;Trunk flexed Gait velocity: decreased   General Gait Details: Cues for sequence, posture, PWB and position from RW.  Pt ltd by nausea and dizziness with movement   Stairs            Wheelchair Mobility    Modified Rankin (Stroke Patients Only)       Balance     Sitting balance-Leahy Scale: Good       Standing balance-Leahy Scale: Poor                      Cognition Arousal/Alertness: Awake/alert Behavior During Therapy: WFL for tasks assessed/performed Overall Cognitive Status: Within Functional Limits for tasks assessed                      Exercises Total Joint Exercises Ankle Circles/Pumps: AROM;Both;15 reps;Supine    General Comments        Pertinent Vitals/Pain Pain Assessment: 0-10 Pain Score: 5  Pain Location: L knee Pain Descriptors / Indicators: Aching;Sore Pain Intervention(s): Limited activity within patient's tolerance;Monitored during session;Premedicated before session;Ice applied    Home Living                      Prior Function            PT Goals (current goals can now be found in the care plan section) Acute Rehab PT Goals Patient Stated Goal: home PT Goal Formulation: With patient Time For Goal Achievement: 08/03/14 Potential to Achieve Goals: Good Progress towards PT goals: Progressing toward goals    Frequency  7X/week    PT Plan Current plan remains appropriate  Co-evaluation             End of Session Equipment Utilized During Treatment: Gait belt;Oxygen;Left knee immobilizer Activity Tolerance: Patient limited by fatigue;Other (comment) (c/o dizziness) Patient left: in bed;with call bell/phone within reach;with family/visitor present     Time: 9093-1121 PT Time Calculation (min) (ACUTE ONLY): 34 min  Charges:  $Gait Training: 8-22 mins $Therapeutic Activity: 8-22 mins                    G Codes:      Kaidin Boehle 2015-08-01, 4:04 PM

## 2015-07-22 NOTE — Progress Notes (Signed)
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  Pt wtih hx COPD, Lung Ca, CAD, PVD, partial R pnuemectomy and back surgery   OT comments  Patient did not wish to attempt EOB/OOB activities during treatment session today. Verbal education provided on ADLs for home. Plan to practice BSC/toileting task tomorrow prior to discharge.  Follow Up Recommendations  Supervision/Assistance - 24 hour    Equipment Recommendations  None recommended by OT    Recommendations for Other Services      Precautions / Restrictions Precautions Precautions: Fall Required Braces or Orthoses: Knee Immobilizer - Left Knee Immobilizer - Left: On at all times Restrictions Weight Bearing Restrictions: Yes LLE Weight Bearing: Partial weight bearing LLE Partial Weight Bearing Percentage or Pounds: 50       Mobility Bed Mobility                  Transfers                      Balance                                   ADL                                         General ADL Comments: Chart review indicates patient fell last night with NT while doing BSC transfer. Patient reports that since then, she has been having increased pain L LE. She has been using the bedpan since the fall. Patient declines working on [facility] transfers or EOB/OOB activities at this time due to pain. She also reports her BP has been low, even this morning. Educated patient on family assistance with ADLs. Patient reports that her two daughters and grandson can assist with bathing and dressing. Educated patient that they will also need to assist with toileting tasks. Patient reports she has BSC and bedpan at home for use. Will follow up with patient tomorrow prior to discharge to practice [facility] transfer/toileting task.       Vision                     Perception     Praxis      Cognition   Behavior During Therapy: WFL for tasks assessed/performed Overall Cognitive Status: Within Functional Limits for tasks assessed                       Extremity/Trunk Assessment               Exercises     Shoulder Instructions       General Comments      Pertinent Vitals/ Pain       Pain Assessment: Faces Faces Pain Scale: Hurts even more Pain Location: L knee Pain Descriptors / Indicators: Aching;Sore Pain Intervention(s): Limited activity within patient's tolerance  Home Living                                          Prior Functioning/Environment  Frequency Min 2X/week     Progress Toward Goals  OT Goals(current goals can now be found in the care plan section)  Progress towards OT goals: Progressing toward goals  Acute Rehab OT Goals Patient Stated Goal: to go home tomorrow  Plan Discharge plan remains appropriate    Co-evaluation                 End of Session     Activity Tolerance Patient limited by pain   Patient Left in bed;with call bell/phone within reach;with bed alarm set   Nurse Communication          Time: 0955-1005 OT Time Calculation (min): 10 min  Charges: OT General Charges $OT Visit: 1 Procedure OT Treatments $Self Care/Home Management : 8-22 mins  Tavis Kring A 07/22/2015, 10:13 AM

## 2015-07-22 NOTE — Progress Notes (Addendum)
   07/21/15 1950  What Happened  Was fall witnessed? Yes  Who witnessed fall? April Juarez NT  Patients activity before fall other (comment);bathroom-assisted (using bsc positioned next to bed)  Point of contact hip/leg  Was patient injured? Yes  Follow Up  MD notified Zollie Beckers MD  Time MD notified 2100  Family notified Yes-comment  Time family notified 2045  Additional tests No  Simple treatment Dressing  Progress note created (see row info) Yes  Adult Fall Risk Assessment  Risk Factor Category (scoring not indicated) High fall risk per protocol (document High fall risk)  Age 69  Fall History: Fall within 6 months prior to admission 0  Elimination; Bowel and/or Urine Incontinence 0  Elimination; Bowel and/or Urine Urgency/Frequency 0  Medications: includes PCA/Opiates, Anti-convulsants, Anti-hypertensives, Diuretics, Hypnotics, Laxatives, Sedatives, and Psychotropics 5  Patient Care Equipment 2  Mobility-Assistance 2  Mobility-Gait 2  Mobility-Sensory Deficit 0  Cognition-Awareness 0  Cognition-Impulsiveness 0  Cognition-Limitations 0  Total Score 12  Patient's Fall Risk Moderate Fall Risk (6-13 points)  Adult Fall Risk Interventions  Required Bundle Interventions *See Row Information* (no info filled in on last assessment at 1400)  Additional Interventions (no info completed on last assessment at 1400)  Fall with Injury Screening  Risk For Fall Injury- See Row Information  (no info completed on last assessment at 1400.)  Intervention(s) for 2 or more risk criteria identified (no info completed on last assessment at 1400.)  Vitals  Temp 99 F (37.2 C)  Temp Source Oral  BP (!) 150/57 mmHg  BP Location Right Arm  BP Method Automatic  Patient Position (if appropriate) Lying  Pulse Rate (!) 137  Pulse Rate Source Dinamap  Resp (!) 24  Oxygen Therapy  SpO2 97 %  O2 Device Nasal Cannula  O2 Flow Rate (L/min) 3 L/min   At 1950 this RN answered call bell at  nursing station and the NT April Juarez said "I need help now". Upon entering patients room, patient was sitting on floor next to bed with NT April Juarez kneeling beside her.  NT said patient had gotten up from bedside commode and was trying to reposition herself closer to the head of the bed when she "lost her balance and fell sideways off the bed".  Patient c/o pain to surgical left knee, knee immobilized was on, site assessed with no obvious signs of injury.  Vital signs taken, see above entry.  Patient was returned to bed using the maxi move. A skin tear to left elbow was noted with no other visible signs of injury, site cleaned and thin film placed. Unit director, MD and patient's daughter Lorriane Shire notified.  Will continue to monitor patient closely overnight.  Christen Bame RN

## 2015-07-23 ENCOUNTER — Encounter (HOSPITAL_COMMUNITY): Payer: Self-pay | Admitting: Orthopaedic Surgery

## 2015-07-23 LAB — GLUCOSE, CAPILLARY
GLUCOSE-CAPILLARY: 198 mg/dL — AB (ref 65–99)
GLUCOSE-CAPILLARY: 207 mg/dL — AB (ref 65–99)
Glucose-Capillary: 224 mg/dL — ABNORMAL HIGH (ref 65–99)

## 2015-07-23 MED ORDER — HYDROCODONE-ACETAMINOPHEN 5-325 MG PO TABS: 1.0000 | ORAL_TABLET | ORAL | Status: DC | PRN

## 2015-07-23 NOTE — Progress Notes (Signed)
Doing well overall.  Can be discharged to home today.

## 2015-07-23 NOTE — Discharge Summary (Signed)
Patient ID: [name] MRN: [id number] DOB/AGE: 11-27-1945 69 y.o.  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 Asthma   . Cholelithiasis   . Colon polyps   . Type II diabetes mellitus ([place])   . GERD (gastroesophageal reflux disease)   . Hyperlipidemia   . Tobacco abuse   . Arthritis   . COPD (chronic obstructive pulmonary disease) (H[facility])     USES O2 AT NIGHT + PRN IN DAYTIME3L  . Emphysema   . H/O hiatal hernia   . Hypertension   . Fibromyalgia   . Lung cancer ([place]) 2010    Adenocarcinoma, right lung, node positive  . On home oxygen therapy     "2-4L at night and prn" (07/18/2104)  . Chronic bronchitis ([place])   . FH: chemotherapy 2010     had 4 times  . Radiation 6/10    28 times right lung, and 5 treatments to sternum  . Oxygen dependent     at night  3liters  . Coronary artery disease     a. 2005 Cath/PCI: mRCA-> Cypher DES;  b. 06/2014 Cath/PCI: EF 60%. LM nl, LAD min irregs, D1 40, LCX 20p, OM2 95 (2.5x14 Resolute DES), RCA 30p, 30 ISR, 20d, PDA/PLA nl.  . Heart murmur   . Peripheral vascular disease ([place])   . Shortness of breath dyspnea     on exertion  . Pneumonia     hx of   . Stress incontinence   . Complication of anesthesia     Difficulty waking up at times from anesthesia    Surgeries: Procedure(s): Removal Intramedullary Rod and Screws Left Knee/Femur, Revision arthroplasty Left knee HARDWARE REMOVAL on 07/19/2015   Consultants:    Discharged Condition: Improved  Hospital Course: [name] is an 69 y.o. female who was admitted 07/19/2015 for operative treatment ofClosed fracture of distal end of left femur with nonunion. Patient has severe unremitting pain that affects sleep, daily activities,  and work/hobbies. After pre-op clearance the patient was taken to the operating room on 07/19/2015 and underwent  Procedure(s): Removal Intramedullary Rod and Screws Left Knee/Femur, Revision arthroplasty Left knee HARDWARE REMOVAL.    Patient was given perioperative antibiotics: Anti-infectives    Start     Dose/Rate Route Frequency Ordered Stop   07/19/15 2000  ceFAZolin (ANCEF) IVPB 2 g/50 mL premix     2 g 100 mL/hr over 30 Minutes Intravenous Every 6 hours 07/19/15 1453 07/20/15 0201   07/19/15 0716  ceFAZolin (ANCEF) IVPB 2 g/50 mL premix     2 g 100 mL/hr over 30 Minutes Intravenous On call to O.R. 07/19/15 0716 07/19/15 1325       Patient was given sequential compression devices, early ambulation, and chemoprophylaxis to prevent DVT.  Patient benefited maximally from hospital stay and there were no complications.    Recent vital signs: Patient Vitals for the past 24 hrs:  BP Temp Temp src Pulse Resp SpO2  07/23/15 0851 - - - - - 96 %  07/23/15 0624 (!) 125/56 mmHg 99.7 F (37.6 C) Oral 94 16 93 %  07/23/15 0001 - - - - - 95 %  07/22/15 2149 (!) 115/52 mmHg 99.4 F (37.4 C) Oral (!) 101 16 96 %  07/22/15 1812 - 99.6 F (37.6 C) - - - -  07/22/15 1400 (!) 107/47 mmHg 98.4 F (36.9 C) Oral 78 16 96 %     Recent laboratory studies:  Recent Labs  07/21/15 0430 07/22/15 0534  WBC 8.4 7.0  HGB 9.0* 8.5*  HCT 28.9* 26.5*  PLT 113* 119*     Discharge Medications:     Medication List    STOP taking these medications        ibuprofen 200 MG tablet  Commonly known as:  ADVIL,MOTRIN      TAKE these medications        acetaminophen 500 MG tablet  Commonly known as:  TYLENOL  Take 1,000 mg by mouth every 6 (six) hours as needed.     aspirin 81 MG EC tablet  Take 1 tablet (81 mg total) by mouth daily.     atorvastatin 40 MG tablet  Commonly known as:  LIPITOR  TAKE ONE TABLET BY MOUTH ONCE DAILY AT  6:00  PM     calcium carbonate 500 MG chewable tablet   Commonly known as:  TUMS - dosed in mg elemental calcium  Chew 3 tablets by mouth daily as needed for indigestion or heartburn.     clopidogrel 75 MG tablet  Commonly known as:  PLAVIX  Take 1 tablet (75 mg total) by mouth daily with breakfast.     COMBIVENT RESPIMAT 20-100 MCG/ACT Aers respimat  Generic drug:  Ipratropium-Albuterol  Inhale 1 puff into the lungs 2 (two) times daily as needed for wheezing.     esomeprazole 40 MG capsule  Commonly known as:  NEXIUM  Take 40 mg by mouth as needed (for heartburm/ acid reflux).     furosemide 20 MG tablet  Commonly known as:  LASIX  Take 20 mg by mouth daily as needed for edema.     HYDROcodone-acetaminophen 5-325 MG tablet  Commonly known as:  NORCO/VICODIN  Take 1-2 tablets by mouth every 4 (four) hours as needed for moderate pain.     insulin aspart 100 UNIT/ML injection  Commonly known as:  novoLOG  Inject 15-25 Units into the skin 3 (three) times daily before meals. Units given depends on her CBG readings. < 200 gives 15 units, > 200 gives 20 units, 20-25 units at supper depending on CBG reading     Insulin Detemir 100 UNIT/ML Pen  Commonly known as:  LEVEMIR FLEXTOUCH  Inject 40 Units into the skin 2 (two) times daily.     lisinopril 10 MG tablet  Commonly known as:  PRINIVIL,ZESTRIL  TAKE ONE TABLET BY MOUTH TWICE DAILY     lovastatin 40 MG tablet  Commonly known as:  MEVACOR  Take 40 mg by mouth daily after supper.     metFORMIN 500 MG tablet  Commonly known as:  GLUCOPHAGE  Take 500 mg by mouth 2 (two) times daily with a meal.     metoprolol tartrate 25 MG tablet  Commonly known as:  LOPRESSOR  TAKE ONE TABLET BY MOUTH TWICE DAILY     nicotine 21 mg/24hr patch  Commonly known as:  NICODERM CQ - dosed in mg/24 hours  Place 21 mg onto the skin daily.     nitroGLYCERIN 0.4 MG SL tablet  Commonly known as:  NITROSTAT  Place 1 tablet (0.4 mg total) under the tongue every 5 (five) minutes x 3 doses as needed for  chest pain.     OXYGEN  Inhale 2-4 L into the  lungs at bedtime as needed.     pregabalin 75 MG capsule  Commonly known as:  LYRICA  Take 75 mg by mouth every morning.     LYRICA 50 MG capsule  Generic drug:  pregabalin  Take 50 mg by mouth at bedtime.     SPIRIVA RESPIMAT 2.5 MCG/ACT Aers  Generic drug:  Tiotropium Bromide Monohydrate  Inhale 2 puffs into the lungs daily.     SYSTANE 0.4-0.3 % Soln  Generic drug:  Polyethyl Glycol-Propyl Glycol  Place 1 drop into both eyes 2 (two) times daily.     Vitamin D 1000 UNITS capsule  Take 1,000 Units by mouth daily.        Diagnostic Studies: Dg Knee Left Port  07/19/2015  CLINICAL DATA:  Status post total knee replacement revision EXAM: DG C-ARM 1-60 MIN-NO REPORT; PORTABLE LEFT KNEE - 1-2 VIEW COMPARISON:  July 19, 2014 FINDINGS: Frontal and lateral views were obtained. There are femoral and tibial prosthetic components which are well seated. Note that there is an old fracture of the distal femoral diaphysis with remodeling. The prosthesis traverses this old fracture. Alignment is near anatomic in this area. No new appearing fracture. No dislocation. No appreciable joint effusion. There is soft tissue air in the joint and distal femoral regions, an expected postoperative finding. IMPRESSION: Prosthetic components appear well seated. Old fracture distal femur with the femoral prosthesis traversing this area. Alignment in this area is near anatomic. Note that there is evidence of a degree of nonunion in this area. It must be cautioned that chronic osteomyelitis can be subtle and potentially could be present but not readily visualized by radiography in this region. Electronically Signed   By: Lowella Grip III M.D.   On: 07/19/2015 14:24   Dg C-arm 1-60 Min-no Report  07/19/2015  CLINICAL DATA: SURGERY C-ARM 1-60 MINUTES Fluoroscopy was utilized by the requesting physician.  No radiographic interpretation.    Disposition: 01-Home  or Self Care      Discharge Instructions    Discharge patient    Complete by:  As directed      For home use only DME oxygen    Complete by:  As directed   Mode or (Route):  Nasal cannula  Frequency:  Continuous (stationary and portable oxygen unit needed)  Oxygen conserving device:  No  Oxygen delivery system:  Gas           Follow-up Information    Follow up with [facility].   Why:  Home Health Physical Therapy   Contact information:   [address] [place] [address] [phone]       Follow up with Mcarthur Rossetti, MD In 2 weeks.   Specialty:  Orthopedic Surgery   Contact information:   [place] [address] [phone]        Signed: Mcarthur Rossetti 07/23/2015, 9:35 AM

## 2015-07-23 NOTE — Progress Notes (Signed)
Discharge instructions given to patient and family. Bethann Punches RN

## 2015-07-23 NOTE — Progress Notes (Signed)
PHYSICAL THERAPY progress note  SATURATION QUALIFICATIONS: (This note is used to comply with regulatory documentation for home oxygen)  Patient Saturations on Room Air at Rest = 92%  Patient Saturations on Room Air while Ambulating = 84%  Patient Saturations on 2 Liters of oxygen while Ambulating = 91%  Please briefly explain why patient needs home oxygen: pt required 2 lts supplemental oxygen to achieve thera[peutic level

## 2015-07-23 NOTE — Progress Notes (Signed)
Occupational Therapy Treatment Patient Details Name: [name] MRN: [id number] DOB: Aug 19, 1946 Today's Date: 07/23/2015    History of present illness Pt s/p revision L TKR with removal L femur IM nail following nonunion.  Pt wtih hx COPD, Lung Ca, CAD, PVD, partial R pnuemectomy and back surgery   OT comments  Family will A pt as needed  Follow Up Recommendations  Home health OT    Equipment Recommendations  None recommended by OT       Precautions / Restrictions Precautions Precautions: Fall Precaution Comments: AROM by pt only - NO ROM intervention by PT at this time Required Braces or Orthoses: Knee Immobilizer - Left Knee Immobilizer - Left: On at all times Restrictions Weight Bearing Restrictions: Yes LLE Weight Bearing: Partial weight bearing LLE Partial Weight Bearing Percentage or Pounds: 50       Mobility Bed Mobility Overal bed mobility: Needs Assistance Bed Mobility: Supine to Sit;Sit to Supine     Supine to sit: Min assist;Mod assist Sit to supine: Min assist;Mod assist   General bed mobility comments: bridging for clothing management- S- using R leg to push up  Transfers      did not perform. Pt performed dressing bed level focusing on strategies for LB dressing                    ADL Overall ADL's : Needs assistance/impaired         Upper Body Bathing: Set up;Sitting   Lower Body Bathing: Bed level;Moderate assistance Lower Body Bathing Details (indicate cue type and reason): educated pt to don pants in bed as knee has to be straight. used R leg to bridge bottom up for clothing management.  Daugther will A pt dress              Toileting- Clothing Manipulation and Hygiene: Maximal assistance;Sit to/from stand Toileting - Clothing Manipulation Details (indicate cue type and reason): daughter will a as needed        General ADL Comments: family will a with all ADL activity . Pt  and family understand PWB as well as keeping  leg straight during ADL activity                Cognition   Behavior During Therapy: WFL for tasks assessed/performed Overall Cognitive Status: Within Functional Limits for tasks assessed                               General Comments      Pertinent Vitals/ Pain       Pain Score: 5  Pain Location: L knee  Pain Descriptors / Indicators: Sore Pain Intervention(s): Limited activity within patient's tolerance;Monitored during session         Frequency Min 2X/week     Progress Toward Goals  OT Goals(current goals can now be found in the care plan section)  Progress towards OT goals: Progressing toward goals     Plan Discharge plan remains appropriate    Co-evaluation                 End of Session     Activity Tolerance Patient tolerated treatment well   Patient Left in bed;with call bell/phone within reach;with bed alarm set   Nurse Communication Mobility status        Time: 1950-9326 OT Time Calculation (min): 28 min  Charges: OT General Charges $OT Visit: 1 Procedure OT  Treatments $Self Care/Home Management : 23-37 mins  Danasha Melman, Thereasa Parkin 07/23/2015, 10:58 AM

## 2015-07-23 NOTE — Discharge Instructions (Signed)
Do not put your full weight on your left leg/knee. Bend your knee only as comfort allows. Use your bone stimulator twice daily. You can get your actual dressing wet in the shower daily.

## 2015-07-23 NOTE — Progress Notes (Signed)
Spoke with [employer] due to pt not receiving her O2 for transport and for home O2 from [place]. [employer] contacted myself and stated that the company stated they delivered the O2 to pt. Informed case manager that pt has not received her O2. Case manager contacted [employer] who then stated they were on their way to deliver the O2 to the pt at the hospital in her room. Pt and family updated on plan of care. Dr. Ninfa Linden also aware of situation. Pt agreeable to stay if O2 is not delivered. It O2 delivered pt to be discharged to family. Night shift nurse aware of situation as well.

## 2015-07-23 NOTE — Progress Notes (Signed)
Spoke with patient at bedside, informed her that [facility] is unable to provide O2 for her. She states she had been without O2 at home "for a while" as she had [employer] come and get her equipment. Patient states she wants to return to using [employer], requesting travel tank for d/c. Contacted James with [employer], faxed information to him, orders, sats etc. He states approximately 2h turn around for delivery. Patient and staff notified.

## 2015-07-23 NOTE — Care Management Important Message (Signed)
Important Message  Patient Details  Name: BRYNNLIE UNTERREINER MRN: [id number] Date of Birth: 13-May-1946   Medicare Important Message Given:  [facility] notification given    Camillo Flaming 07/23/2015, 1:04 [place] Message  Patient Details  Name: DREAMER CARILLO MRN: [id number] Date of Birth: 04-Nov-1945   Medicare Important Message Given:  Yes-second notification given    Camillo Flaming 07/23/2015, 1:04 PM

## 2015-07-23 NOTE — Progress Notes (Signed)
Physical Therapy Treatment Patient Details Name: [name] MRN: [id number] DOB: 1946/01/09 Today's Date: 07/23/2015    History of Present Illness Pt s/p revision L TKR with removal L femur IM nail following nonunion.  Pt wtih hx COPD, Lung Ca, CAD, PVD, partial R pnuemectomy and back surgery    PT Comments    Pt dressed and eager to D/C to home.  Assisted OOB to amb a limited distance on RA.  (see below) Pt aware to wear KI at "all times" Pt aware she is PWB until MD states otherwise Pt aware to elevate and use ICE  Follow Up Recommendations  Home health PT     Equipment Recommendations  None recommended by PT (has everything)    Recommendations for Other Services       Precautions / Restrictions Precautions Precautions: Fall Precaution Comments: AROM by pt only - NO ROM intervention by PT at this time Required Braces or Orthoses: Knee Immobilizer - Left Knee Immobilizer - Left: On at all times Restrictions Weight Bearing Restrictions: Yes LLE Weight Bearing: Partial weight bearing LLE Partial Weight Bearing Percentage or Pounds: 50%    pt aware     Mobility  Bed Mobility Overal bed mobility: Needs Assistance Bed Mobility: Supine to Sit     Supine to sit: Min assist Sit to supine: Min assist;Mod assist   General bed mobility comments: assist with L LE slowly off bed  Transfers Overall transfer level: Needs assistance Equipment used: Rolling walker (2 wheeled) Transfers: Sit to/from Stand Sit to Stand: Min assist         General transfer comment: 25% VC's on proper tech of hand placement and LR advancement  Ambulation/Gait Ambulation/Gait assistance: Min guard Ambulation Distance (Feet): 8 Feet Assistive device: Rolling walker (2 wheeled) Gait Pattern/deviations: Step-to pattern;Decreased step length - left Gait velocity: decreased   General Gait Details: increased time.  Did well achieving PWB.  Amb RA decreased to 84% with HR increaseing to 100.   Applied 2 lts O2 avg sats low 90's.     Stairs Stairs:  (pt has a ramp and a wheelchair)          Wheelchair Mobility    Modified Rankin (Stroke Patients Only)       Balance                                    Cognition Arousal/Alertness: Awake/alert Behavior During Therapy: WFL for tasks assessed/performed Overall Cognitive Status: Within Functional Limits for tasks assessed                      Exercises      General Comments        Pertinent Vitals/Pain Pain Assessment: 0-10 Pain Score: 7  Pain Location: L knee Pain Descriptors / Indicators: Sore;Grimacing Pain Intervention(s): Monitored during session;Premedicated before session;Repositioned;Ice applied    Home Living                      Prior Function            PT Goals (current goals can now be found in the care plan section) Progress towards PT goals: Progressing toward goals    Frequency  7X/week    PT Plan Current plan remains appropriate    Co-evaluation             End of Session Equipment  Utilized During Treatment: Gait belt;Oxygen;Left knee immobilizer Activity Tolerance: Patient limited by fatigue;Patient limited by pain Patient left: in chair;with call bell/phone within reach;with family/visitor present     Time: 1110-1135 PT Time Calculation (min) (ACUTE ONLY): 25 min  Charges:  $Gait Training: 8-22 mins $Therapeutic Activity: 8-22 mins                    G Codes:      Rica Koyanagi  PTA [facility]  Acute  Rehab Pager      [phone]

## 2015-07-24 ENCOUNTER — Other Ambulatory Visit: Payer: Self-pay | Admitting: Cardiology

## 2015-07-24 DIAGNOSIS — I2510 Atherosclerotic heart disease of native coronary artery without angina pectoris: Secondary | ICD-10-CM | POA: Diagnosis not present

## 2015-07-24 DIAGNOSIS — E1151 Type 2 diabetes mellitus with diabetic peripheral angiopathy without gangrene: Secondary | ICD-10-CM | POA: Diagnosis not present

## 2015-07-24 DIAGNOSIS — I10 Essential (primary) hypertension: Secondary | ICD-10-CM | POA: Diagnosis not present

## 2015-07-24 DIAGNOSIS — M9712XD Periprosthetic fracture around internal prosthetic left knee joint, subsequent encounter: Secondary | ICD-10-CM | POA: Diagnosis not present

## 2015-07-24 DIAGNOSIS — J449 Chronic obstructive pulmonary disease, unspecified: Secondary | ICD-10-CM | POA: Diagnosis not present

## 2015-07-24 DIAGNOSIS — J45909 Unspecified asthma, uncomplicated: Secondary | ICD-10-CM | POA: Diagnosis not present

## 2015-07-26 DIAGNOSIS — I10 Essential (primary) hypertension: Secondary | ICD-10-CM | POA: Diagnosis not present

## 2015-07-26 DIAGNOSIS — M9712XD Periprosthetic fracture around internal prosthetic left knee joint, subsequent encounter: Secondary | ICD-10-CM | POA: Diagnosis not present

## 2015-07-26 DIAGNOSIS — J449 Chronic obstructive pulmonary disease, unspecified: Secondary | ICD-10-CM | POA: Diagnosis not present

## 2015-07-26 DIAGNOSIS — J45909 Unspecified asthma, uncomplicated: Secondary | ICD-10-CM | POA: Diagnosis not present

## 2015-07-26 DIAGNOSIS — E1151 Type 2 diabetes mellitus with diabetic peripheral angiopathy without gangrene: Secondary | ICD-10-CM | POA: Diagnosis not present

## 2015-07-26 DIAGNOSIS — I2510 Atherosclerotic heart disease of native coronary artery without angina pectoris: Secondary | ICD-10-CM | POA: Diagnosis not present

## 2015-07-30 DIAGNOSIS — J449 Chronic obstructive pulmonary disease, unspecified: Secondary | ICD-10-CM | POA: Diagnosis not present

## 2015-07-30 DIAGNOSIS — I10 Essential (primary) hypertension: Secondary | ICD-10-CM | POA: Diagnosis not present

## 2015-07-30 DIAGNOSIS — E1151 Type 2 diabetes mellitus with diabetic peripheral angiopathy without gangrene: Secondary | ICD-10-CM | POA: Diagnosis not present

## 2015-07-30 DIAGNOSIS — J45909 Unspecified asthma, uncomplicated: Secondary | ICD-10-CM | POA: Diagnosis not present

## 2015-07-30 DIAGNOSIS — I2510 Atherosclerotic heart disease of native coronary artery without angina pectoris: Secondary | ICD-10-CM | POA: Diagnosis not present

## 2015-07-30 DIAGNOSIS — M9712XD Periprosthetic fracture around internal prosthetic left knee joint, subsequent encounter: Secondary | ICD-10-CM | POA: Diagnosis not present

## 2015-08-01 DIAGNOSIS — M25562 Pain in left knee: Secondary | ICD-10-CM | POA: Diagnosis not present

## 2015-08-02 ENCOUNTER — Telehealth: Payer: Self-pay

## 2015-08-02 DIAGNOSIS — E869 Volume depletion, unspecified: Secondary | ICD-10-CM | POA: Diagnosis not present

## 2015-08-02 DIAGNOSIS — E1151 Type 2 diabetes mellitus with diabetic peripheral angiopathy without gangrene: Secondary | ICD-10-CM | POA: Diagnosis not present

## 2015-08-02 DIAGNOSIS — R32 Unspecified urinary incontinence: Secondary | ICD-10-CM | POA: Diagnosis not present

## 2015-08-02 DIAGNOSIS — J449 Chronic obstructive pulmonary disease, unspecified: Secondary | ICD-10-CM | POA: Diagnosis not present

## 2015-08-02 DIAGNOSIS — M9712XD Periprosthetic fracture around internal prosthetic left knee joint, subsequent encounter: Secondary | ICD-10-CM | POA: Diagnosis not present

## 2015-08-02 DIAGNOSIS — J45909 Unspecified asthma, uncomplicated: Secondary | ICD-10-CM | POA: Diagnosis not present

## 2015-08-02 DIAGNOSIS — I2510 Atherosclerotic heart disease of native coronary artery without angina pectoris: Secondary | ICD-10-CM | POA: Diagnosis not present

## 2015-08-02 DIAGNOSIS — M6281 Muscle weakness (generalized): Secondary | ICD-10-CM | POA: Diagnosis not present

## 2015-08-02 DIAGNOSIS — I10 Essential (primary) hypertension: Secondary | ICD-10-CM | POA: Diagnosis not present

## 2015-08-02 NOTE — Telephone Encounter (Signed)
Atorvastatin denied per [facility]. When I called patient to inform her of this, she states, "not taking that." Somewhere and by someone, it was changed to Lovastatin. Will discontinue Atorvastatin on med list.

## 2015-08-03 DIAGNOSIS — J449 Chronic obstructive pulmonary disease, unspecified: Secondary | ICD-10-CM | POA: Diagnosis not present

## 2015-08-03 DIAGNOSIS — I2510 Atherosclerotic heart disease of native coronary artery without angina pectoris: Secondary | ICD-10-CM | POA: Diagnosis not present

## 2015-08-03 DIAGNOSIS — E1151 Type 2 diabetes mellitus with diabetic peripheral angiopathy without gangrene: Secondary | ICD-10-CM | POA: Diagnosis not present

## 2015-08-03 DIAGNOSIS — J45909 Unspecified asthma, uncomplicated: Secondary | ICD-10-CM | POA: Diagnosis not present

## 2015-08-03 DIAGNOSIS — I10 Essential (primary) hypertension: Secondary | ICD-10-CM | POA: Diagnosis not present

## 2015-08-03 DIAGNOSIS — M9712XD Periprosthetic fracture around internal prosthetic left knee joint, subsequent encounter: Secondary | ICD-10-CM | POA: Diagnosis not present

## 2015-08-07 ENCOUNTER — Other Ambulatory Visit: Payer: Self-pay | Admitting: Cardiology

## 2015-08-07 DIAGNOSIS — I10 Essential (primary) hypertension: Secondary | ICD-10-CM | POA: Diagnosis not present

## 2015-08-07 DIAGNOSIS — M9712XD Periprosthetic fracture around internal prosthetic left knee joint, subsequent encounter: Secondary | ICD-10-CM | POA: Diagnosis not present

## 2015-08-07 DIAGNOSIS — J45909 Unspecified asthma, uncomplicated: Secondary | ICD-10-CM | POA: Diagnosis not present

## 2015-08-07 DIAGNOSIS — I2510 Atherosclerotic heart disease of native coronary artery without angina pectoris: Secondary | ICD-10-CM | POA: Diagnosis not present

## 2015-08-07 DIAGNOSIS — J449 Chronic obstructive pulmonary disease, unspecified: Secondary | ICD-10-CM | POA: Diagnosis not present

## 2015-08-07 DIAGNOSIS — E1151 Type 2 diabetes mellitus with diabetic peripheral angiopathy without gangrene: Secondary | ICD-10-CM | POA: Diagnosis not present

## 2015-08-09 DIAGNOSIS — I2510 Atherosclerotic heart disease of native coronary artery without angina pectoris: Secondary | ICD-10-CM | POA: Diagnosis not present

## 2015-08-09 DIAGNOSIS — J449 Chronic obstructive pulmonary disease, unspecified: Secondary | ICD-10-CM | POA: Diagnosis not present

## 2015-08-09 DIAGNOSIS — I10 Essential (primary) hypertension: Secondary | ICD-10-CM | POA: Diagnosis not present

## 2015-08-09 DIAGNOSIS — J45909 Unspecified asthma, uncomplicated: Secondary | ICD-10-CM | POA: Diagnosis not present

## 2015-08-09 DIAGNOSIS — M9712XD Periprosthetic fracture around internal prosthetic left knee joint, subsequent encounter: Secondary | ICD-10-CM | POA: Diagnosis not present

## 2015-08-09 DIAGNOSIS — E1151 Type 2 diabetes mellitus with diabetic peripheral angiopathy without gangrene: Secondary | ICD-10-CM | POA: Diagnosis not present

## 2015-08-14 DIAGNOSIS — J449 Chronic obstructive pulmonary disease, unspecified: Secondary | ICD-10-CM | POA: Diagnosis not present

## 2015-08-14 DIAGNOSIS — E1151 Type 2 diabetes mellitus with diabetic peripheral angiopathy without gangrene: Secondary | ICD-10-CM | POA: Diagnosis not present

## 2015-08-14 DIAGNOSIS — I2510 Atherosclerotic heart disease of native coronary artery without angina pectoris: Secondary | ICD-10-CM | POA: Diagnosis not present

## 2015-08-14 DIAGNOSIS — J45909 Unspecified asthma, uncomplicated: Secondary | ICD-10-CM | POA: Diagnosis not present

## 2015-08-14 DIAGNOSIS — I10 Essential (primary) hypertension: Secondary | ICD-10-CM | POA: Diagnosis not present

## 2015-08-14 DIAGNOSIS — M9712XD Periprosthetic fracture around internal prosthetic left knee joint, subsequent encounter: Secondary | ICD-10-CM | POA: Diagnosis not present

## 2015-08-17 DIAGNOSIS — I2510 Atherosclerotic heart disease of native coronary artery without angina pectoris: Secondary | ICD-10-CM | POA: Diagnosis not present

## 2015-08-17 DIAGNOSIS — J449 Chronic obstructive pulmonary disease, unspecified: Secondary | ICD-10-CM | POA: Diagnosis not present

## 2015-08-17 DIAGNOSIS — E1151 Type 2 diabetes mellitus with diabetic peripheral angiopathy without gangrene: Secondary | ICD-10-CM | POA: Diagnosis not present

## 2015-08-17 DIAGNOSIS — J45909 Unspecified asthma, uncomplicated: Secondary | ICD-10-CM | POA: Diagnosis not present

## 2015-08-17 DIAGNOSIS — M9712XD Periprosthetic fracture around internal prosthetic left knee joint, subsequent encounter: Secondary | ICD-10-CM | POA: Diagnosis not present

## 2015-08-17 DIAGNOSIS — I10 Essential (primary) hypertension: Secondary | ICD-10-CM | POA: Diagnosis not present

## 2015-08-21 DIAGNOSIS — M9712XD Periprosthetic fracture around internal prosthetic left knee joint, subsequent encounter: Secondary | ICD-10-CM | POA: Diagnosis not present

## 2015-08-21 DIAGNOSIS — J449 Chronic obstructive pulmonary disease, unspecified: Secondary | ICD-10-CM | POA: Diagnosis not present

## 2015-08-21 DIAGNOSIS — J45909 Unspecified asthma, uncomplicated: Secondary | ICD-10-CM | POA: Diagnosis not present

## 2015-08-21 DIAGNOSIS — I10 Essential (primary) hypertension: Secondary | ICD-10-CM | POA: Diagnosis not present

## 2015-08-21 DIAGNOSIS — I2510 Atherosclerotic heart disease of native coronary artery without angina pectoris: Secondary | ICD-10-CM | POA: Diagnosis not present

## 2015-08-21 DIAGNOSIS — E1151 Type 2 diabetes mellitus with diabetic peripheral angiopathy without gangrene: Secondary | ICD-10-CM | POA: Diagnosis not present

## 2015-08-23 DIAGNOSIS — I2510 Atherosclerotic heart disease of native coronary artery without angina pectoris: Secondary | ICD-10-CM | POA: Diagnosis not present

## 2015-08-23 DIAGNOSIS — J45909 Unspecified asthma, uncomplicated: Secondary | ICD-10-CM | POA: Diagnosis not present

## 2015-08-23 DIAGNOSIS — E1151 Type 2 diabetes mellitus with diabetic peripheral angiopathy without gangrene: Secondary | ICD-10-CM | POA: Diagnosis not present

## 2015-08-23 DIAGNOSIS — J449 Chronic obstructive pulmonary disease, unspecified: Secondary | ICD-10-CM | POA: Diagnosis not present

## 2015-08-23 DIAGNOSIS — I10 Essential (primary) hypertension: Secondary | ICD-10-CM | POA: Diagnosis not present

## 2015-08-23 DIAGNOSIS — M9712XD Periprosthetic fracture around internal prosthetic left knee joint, subsequent encounter: Secondary | ICD-10-CM | POA: Diagnosis not present

## 2015-08-27 DIAGNOSIS — E1151 Type 2 diabetes mellitus with diabetic peripheral angiopathy without gangrene: Secondary | ICD-10-CM | POA: Diagnosis not present

## 2015-08-27 DIAGNOSIS — J45909 Unspecified asthma, uncomplicated: Secondary | ICD-10-CM | POA: Diagnosis not present

## 2015-08-27 DIAGNOSIS — I2510 Atherosclerotic heart disease of native coronary artery without angina pectoris: Secondary | ICD-10-CM | POA: Diagnosis not present

## 2015-08-27 DIAGNOSIS — I10 Essential (primary) hypertension: Secondary | ICD-10-CM | POA: Diagnosis not present

## 2015-08-27 DIAGNOSIS — J449 Chronic obstructive pulmonary disease, unspecified: Secondary | ICD-10-CM | POA: Diagnosis not present

## 2015-08-27 DIAGNOSIS — M9712XD Periprosthetic fracture around internal prosthetic left knee joint, subsequent encounter: Secondary | ICD-10-CM | POA: Diagnosis not present

## 2015-08-28 DIAGNOSIS — E1151 Type 2 diabetes mellitus with diabetic peripheral angiopathy without gangrene: Secondary | ICD-10-CM | POA: Diagnosis not present

## 2015-08-28 DIAGNOSIS — J45909 Unspecified asthma, uncomplicated: Secondary | ICD-10-CM | POA: Diagnosis not present

## 2015-08-28 DIAGNOSIS — I2510 Atherosclerotic heart disease of native coronary artery without angina pectoris: Secondary | ICD-10-CM | POA: Diagnosis not present

## 2015-08-28 DIAGNOSIS — J449 Chronic obstructive pulmonary disease, unspecified: Secondary | ICD-10-CM | POA: Diagnosis not present

## 2015-08-28 DIAGNOSIS — M9712XD Periprosthetic fracture around internal prosthetic left knee joint, subsequent encounter: Secondary | ICD-10-CM | POA: Diagnosis not present

## 2015-08-28 DIAGNOSIS — I10 Essential (primary) hypertension: Secondary | ICD-10-CM | POA: Diagnosis not present

## 2015-08-29 DIAGNOSIS — M25562 Pain in left knee: Secondary | ICD-10-CM | POA: Diagnosis not present

## 2015-09-02 DIAGNOSIS — E869 Volume depletion, unspecified: Secondary | ICD-10-CM | POA: Diagnosis not present

## 2015-09-02 DIAGNOSIS — J449 Chronic obstructive pulmonary disease, unspecified: Secondary | ICD-10-CM | POA: Diagnosis not present

## 2015-09-04 DIAGNOSIS — J449 Chronic obstructive pulmonary disease, unspecified: Secondary | ICD-10-CM | POA: Diagnosis not present

## 2015-09-04 DIAGNOSIS — I10 Essential (primary) hypertension: Secondary | ICD-10-CM | POA: Diagnosis not present

## 2015-09-04 DIAGNOSIS — E1151 Type 2 diabetes mellitus with diabetic peripheral angiopathy without gangrene: Secondary | ICD-10-CM | POA: Diagnosis not present

## 2015-09-04 DIAGNOSIS — M9712XD Periprosthetic fracture around internal prosthetic left knee joint, subsequent encounter: Secondary | ICD-10-CM | POA: Diagnosis not present

## 2015-09-04 DIAGNOSIS — J45909 Unspecified asthma, uncomplicated: Secondary | ICD-10-CM | POA: Diagnosis not present

## 2015-09-04 DIAGNOSIS — E1165 Type 2 diabetes mellitus with hyperglycemia: Secondary | ICD-10-CM | POA: Diagnosis not present

## 2015-09-04 DIAGNOSIS — I2510 Atherosclerotic heart disease of native coronary artery without angina pectoris: Secondary | ICD-10-CM | POA: Diagnosis not present

## 2015-09-06 DIAGNOSIS — J45909 Unspecified asthma, uncomplicated: Secondary | ICD-10-CM | POA: Diagnosis not present

## 2015-09-06 DIAGNOSIS — I2510 Atherosclerotic heart disease of native coronary artery without angina pectoris: Secondary | ICD-10-CM | POA: Diagnosis not present

## 2015-09-06 DIAGNOSIS — J449 Chronic obstructive pulmonary disease, unspecified: Secondary | ICD-10-CM | POA: Diagnosis not present

## 2015-09-06 DIAGNOSIS — E1151 Type 2 diabetes mellitus with diabetic peripheral angiopathy without gangrene: Secondary | ICD-10-CM | POA: Diagnosis not present

## 2015-09-06 DIAGNOSIS — I10 Essential (primary) hypertension: Secondary | ICD-10-CM | POA: Diagnosis not present

## 2015-09-06 DIAGNOSIS — M9712XD Periprosthetic fracture around internal prosthetic left knee joint, subsequent encounter: Secondary | ICD-10-CM | POA: Diagnosis not present

## 2015-09-07 DIAGNOSIS — I10 Essential (primary) hypertension: Secondary | ICD-10-CM | POA: Diagnosis not present

## 2015-09-07 DIAGNOSIS — K219 Gastro-esophageal reflux disease without esophagitis: Secondary | ICD-10-CM | POA: Diagnosis not present

## 2015-09-07 DIAGNOSIS — E782 Mixed hyperlipidemia: Secondary | ICD-10-CM | POA: Diagnosis not present

## 2015-09-07 DIAGNOSIS — J449 Chronic obstructive pulmonary disease, unspecified: Secondary | ICD-10-CM | POA: Diagnosis not present

## 2015-09-07 DIAGNOSIS — E1165 Type 2 diabetes mellitus with hyperglycemia: Secondary | ICD-10-CM | POA: Diagnosis not present

## 2015-09-11 DIAGNOSIS — M9712XD Periprosthetic fracture around internal prosthetic left knee joint, subsequent encounter: Secondary | ICD-10-CM | POA: Diagnosis not present

## 2015-09-11 DIAGNOSIS — J45909 Unspecified asthma, uncomplicated: Secondary | ICD-10-CM | POA: Diagnosis not present

## 2015-09-11 DIAGNOSIS — I2510 Atherosclerotic heart disease of native coronary artery without angina pectoris: Secondary | ICD-10-CM | POA: Diagnosis not present

## 2015-09-11 DIAGNOSIS — E1151 Type 2 diabetes mellitus with diabetic peripheral angiopathy without gangrene: Secondary | ICD-10-CM | POA: Diagnosis not present

## 2015-09-11 DIAGNOSIS — J449 Chronic obstructive pulmonary disease, unspecified: Secondary | ICD-10-CM | POA: Diagnosis not present

## 2015-09-11 DIAGNOSIS — I10 Essential (primary) hypertension: Secondary | ICD-10-CM | POA: Diagnosis not present

## 2015-09-22 DIAGNOSIS — J449 Chronic obstructive pulmonary disease, unspecified: Secondary | ICD-10-CM | POA: Diagnosis not present

## 2015-10-02 DIAGNOSIS — E869 Volume depletion, unspecified: Secondary | ICD-10-CM | POA: Diagnosis not present

## 2015-10-02 DIAGNOSIS — J449 Chronic obstructive pulmonary disease, unspecified: Secondary | ICD-10-CM | POA: Diagnosis not present

## 2015-10-10 ENCOUNTER — Encounter: Payer: Self-pay | Admitting: Gastroenterology

## 2015-10-10 DIAGNOSIS — M25562 Pain in left knee: Secondary | ICD-10-CM | POA: Diagnosis not present

## 2015-10-23 DIAGNOSIS — J449 Chronic obstructive pulmonary disease, unspecified: Secondary | ICD-10-CM | POA: Diagnosis not present

## 2015-11-02 ENCOUNTER — Other Ambulatory Visit: Payer: Self-pay | Admitting: Cardiology

## 2015-11-02 DIAGNOSIS — E869 Volume depletion, unspecified: Secondary | ICD-10-CM | POA: Diagnosis not present

## 2015-11-02 DIAGNOSIS — J449 Chronic obstructive pulmonary disease, unspecified: Secondary | ICD-10-CM | POA: Diagnosis not present

## 2015-11-02 NOTE — Telephone Encounter (Signed)
Rx request sent to pharmacy.  

## 2015-11-07 DIAGNOSIS — M25562 Pain in left knee: Secondary | ICD-10-CM | POA: Diagnosis not present

## 2015-11-07 DIAGNOSIS — Z969 Presence of functional implant, unspecified: Secondary | ICD-10-CM | POA: Diagnosis not present

## 2015-11-08 ENCOUNTER — Other Ambulatory Visit: Payer: Self-pay | Admitting: *Deleted

## 2015-11-08 ENCOUNTER — Emergency Department (HOSPITAL_COMMUNITY): Payer: Medicare Other

## 2015-11-08 ENCOUNTER — Encounter (HOSPITAL_COMMUNITY): Payer: Self-pay | Admitting: *Deleted

## 2015-11-08 ENCOUNTER — Emergency Department (HOSPITAL_COMMUNITY)
Admission: EM | Admit: 2015-11-08 | Discharge: 2015-11-08 | Discharge status: Against Medical Advice | Payer: Medicare Other | Source: Home / Self Care | Attending: Emergency Medicine | Admitting: Emergency Medicine

## 2015-11-08 DIAGNOSIS — T82855A Stenosis of coronary artery stent, initial encounter: Secondary | ICD-10-CM | POA: Diagnosis not present

## 2015-11-08 DIAGNOSIS — K219 Gastro-esophageal reflux disease without esophagitis: Secondary | ICD-10-CM

## 2015-11-08 DIAGNOSIS — Z7984 Long term (current) use of oral hypoglycemic drugs: Secondary | ICD-10-CM | POA: Insufficient documentation

## 2015-11-08 DIAGNOSIS — M797 Fibromyalgia: Secondary | ICD-10-CM

## 2015-11-08 DIAGNOSIS — Z9981 Dependence on supplemental oxygen: Secondary | ICD-10-CM

## 2015-11-08 DIAGNOSIS — J441 Chronic obstructive pulmonary disease with (acute) exacerbation: Secondary | ICD-10-CM | POA: Insufficient documentation

## 2015-11-08 DIAGNOSIS — Z9861 Coronary angioplasty status: Secondary | ICD-10-CM | POA: Insufficient documentation

## 2015-11-08 DIAGNOSIS — M1990 Unspecified osteoarthritis, unspecified site: Secondary | ICD-10-CM | POA: Insufficient documentation

## 2015-11-08 DIAGNOSIS — Z79899 Other long term (current) drug therapy: Secondary | ICD-10-CM | POA: Insufficient documentation

## 2015-11-08 DIAGNOSIS — Z8781 Personal history of (healed) traumatic fracture: Secondary | ICD-10-CM | POA: Insufficient documentation

## 2015-11-08 DIAGNOSIS — Z9889 Other specified postprocedural states: Secondary | ICD-10-CM | POA: Insufficient documentation

## 2015-11-08 DIAGNOSIS — E119 Type 2 diabetes mellitus without complications: Secondary | ICD-10-CM | POA: Diagnosis not present

## 2015-11-08 DIAGNOSIS — J449 Chronic obstructive pulmonary disease, unspecified: Secondary | ICD-10-CM | POA: Diagnosis not present

## 2015-11-08 DIAGNOSIS — Z86010 Personal history of colonic polyps: Secondary | ICD-10-CM

## 2015-11-08 DIAGNOSIS — Z85118 Personal history of other malignant neoplasm of bronchus and lung: Secondary | ICD-10-CM | POA: Insufficient documentation

## 2015-11-08 DIAGNOSIS — R079 Chest pain, unspecified: Secondary | ICD-10-CM | POA: Insufficient documentation

## 2015-11-08 DIAGNOSIS — K591 Functional diarrhea: Secondary | ICD-10-CM | POA: Diagnosis not present

## 2015-11-08 DIAGNOSIS — I739 Peripheral vascular disease, unspecified: Secondary | ICD-10-CM

## 2015-11-08 DIAGNOSIS — R011 Cardiac murmur, unspecified: Secondary | ICD-10-CM | POA: Insufficient documentation

## 2015-11-08 DIAGNOSIS — Z87448 Personal history of other diseases of urinary system: Secondary | ICD-10-CM | POA: Insufficient documentation

## 2015-11-08 DIAGNOSIS — R000 Tachycardia, unspecified: Secondary | ICD-10-CM

## 2015-11-08 DIAGNOSIS — I10 Essential (primary) hypertension: Secondary | ICD-10-CM | POA: Insufficient documentation

## 2015-11-08 DIAGNOSIS — F17210 Nicotine dependence, cigarettes, uncomplicated: Secondary | ICD-10-CM | POA: Insufficient documentation

## 2015-11-08 DIAGNOSIS — E785 Hyperlipidemia, unspecified: Secondary | ICD-10-CM

## 2015-11-08 DIAGNOSIS — I2510 Atherosclerotic heart disease of native coronary artery without angina pectoris: Secondary | ICD-10-CM

## 2015-11-08 DIAGNOSIS — Z794 Long term (current) use of insulin: Secondary | ICD-10-CM | POA: Insufficient documentation

## 2015-11-08 DIAGNOSIS — R531 Weakness: Secondary | ICD-10-CM

## 2015-11-08 DIAGNOSIS — R600 Localized edema: Secondary | ICD-10-CM | POA: Insufficient documentation

## 2015-11-08 DIAGNOSIS — J45909 Unspecified asthma, uncomplicated: Secondary | ICD-10-CM | POA: Diagnosis not present

## 2015-11-08 DIAGNOSIS — Z955 Presence of coronary angioplasty implant and graft: Secondary | ICD-10-CM | POA: Diagnosis not present

## 2015-11-08 DIAGNOSIS — Z7902 Long term (current) use of antithrombotics/antiplatelets: Secondary | ICD-10-CM | POA: Insufficient documentation

## 2015-11-08 DIAGNOSIS — Z96652 Presence of left artificial knee joint: Secondary | ICD-10-CM | POA: Diagnosis not present

## 2015-11-08 DIAGNOSIS — Z8701 Personal history of pneumonia (recurrent): Secondary | ICD-10-CM

## 2015-11-08 DIAGNOSIS — Z8249 Family history of ischemic heart disease and other diseases of the circulatory system: Secondary | ICD-10-CM | POA: Diagnosis not present

## 2015-11-08 DIAGNOSIS — I25110 Atherosclerotic heart disease of native coronary artery with unstable angina pectoris: Secondary | ICD-10-CM | POA: Diagnosis not present

## 2015-11-08 DIAGNOSIS — R0602 Shortness of breath: Secondary | ICD-10-CM | POA: Diagnosis not present

## 2015-11-08 DIAGNOSIS — Z9221 Personal history of antineoplastic chemotherapy: Secondary | ICD-10-CM | POA: Diagnosis not present

## 2015-11-08 DIAGNOSIS — Z833 Family history of diabetes mellitus: Secondary | ICD-10-CM | POA: Diagnosis not present

## 2015-11-08 DIAGNOSIS — R0789 Other chest pain: Secondary | ICD-10-CM | POA: Diagnosis not present

## 2015-11-08 LAB — CBC WITH DIFFERENTIAL/PLATELET
Basophils Absolute: 0 10*3/uL (ref 0.0–0.1)
Basophils Relative: 0 %
EOS ABS: 0.2 10*3/uL (ref 0.0–0.7)
EOS PCT: 3 %
HCT: 38.7 % (ref 36.0–46.0)
HEMOGLOBIN: 11.7 g/dL — AB (ref 12.0–15.0)
Lymphocytes Relative: 32 %
Lymphs Abs: 1.9 10*3/uL (ref 0.7–4.0)
MCH: 23.7 pg — ABNORMAL LOW (ref 26.0–34.0)
MCHC: 30.2 g/dL (ref 30.0–36.0)
MCV: 78.5 fL (ref 78.0–100.0)
Monocytes Absolute: 0.5 10*3/uL (ref 0.1–1.0)
Monocytes Relative: 8 %
NEUTROS PCT: 57 %
Neutro Abs: 3.3 10*3/uL (ref 1.7–7.7)
Platelets: 149 10*3/uL — ABNORMAL LOW (ref 150–400)
RBC: 4.93 MIL/uL (ref 3.87–5.11)
RDW: 15 % (ref 11.5–15.5)
WBC: 5.9 10*3/uL (ref 4.0–10.5)

## 2015-11-08 LAB — COMPREHENSIVE METABOLIC PANEL
ALBUMIN: 4 g/dL (ref 3.5–5.0)
ALK PHOS: 105 U/L (ref 38–126)
ALT: 12 U/L — AB (ref 14–54)
AST: 22 U/L (ref 15–41)
Anion gap: 11 (ref 5–15)
BILIRUBIN TOTAL: 0.3 mg/dL (ref 0.3–1.2)
BUN: 20 mg/dL (ref 6–20)
CO2: 24 mmol/L (ref 22–32)
CREATININE: 0.79 mg/dL (ref 0.44–1.00)
Calcium: 9.2 mg/dL (ref 8.9–10.3)
Chloride: 103 mmol/L (ref 101–111)
GFR calc Af Amer: >60 mL/min (ref >60)
GFR calc non Af Amer: >60 mL/min (ref >60)
Glucose, Bld: 228 mg/dL — ABNORMAL HIGH (ref 65–99)
POTASSIUM: 4.4 mmol/L (ref 3.5–5.1)
SODIUM: 138 mmol/L (ref 135–145)
TOTAL PROTEIN: 7.7 g/dL (ref 6.5–8.1)

## 2015-11-08 LAB — APTT: aPTT: 28 seconds (ref 24–37)

## 2015-11-08 LAB — PROTIME-INR
INR: 0.96 (ref 0.00–1.49)
Prothrombin Time: 13 seconds (ref 11.6–15.2)

## 2015-11-08 LAB — TROPONIN I: Troponin I: <0.03 ng/mL (ref <0.031)

## 2015-11-08 LAB — CBG MONITORING, ED: Glucose-Capillary: 191 mg/dL — ABNORMAL HIGH (ref 65–99)

## 2015-11-08 MED ORDER — ASPIRIN 81 MG PO CHEW
324.0000 mg | CHEWABLE_TABLET | Freq: Once | ORAL | Status: DC
Start: 2015-11-08 — End: 2015-11-08

## 2015-11-08 MED ORDER — LISINOPRIL 10 MG PO TABS: 10.0000 mg | ORAL_TABLET | Freq: Two times a day (BID) | ORAL | Status: DC

## 2015-11-08 MED ORDER — NITROGLYCERIN 0.4 MG SL SUBL: 0.4000 mg | SUBLINGUAL_TABLET | SUBLINGUAL | Status: DC | PRN

## 2015-11-08 MED ORDER — IOHEXOL 350 MG/ML SOLN
100.0000 mL | Freq: Once | INTRAVENOUS | Status: AC | PRN
  Administered 2015-11-08: 100 mL via INTRAVENOUS

## 2015-11-08 NOTE — ED Notes (Signed)
CBG 191. RN aware

## 2015-11-08 NOTE — ED Notes (Signed)
Pt has not eating all day, Family wanting CBG checked

## 2015-11-08 NOTE — ED Provider Notes (Signed)
CSN: [id number]     Arrival date & time 11/08/15  1549 History   First MD Initiated Contact with Patient 11/08/15 1631     Chief Complaint  Patient presents with  . Chest Pain     (Consider location/radiation/quality/duration/timing/severity/associated sxs/prior Treatment) HPI Comments:  The patient is a 70 year old female, she has a known history of cardiac disease status post stenting most recently in September 2015. She also has a history of COPD, she is on chronic oxygen 2 L by nasal cannula , sometimes she takes an increased amount of oxygen. She reports stating that over the last couple of days she has had some abnormal sensations including some nausea, some dizziness, some feeling of being off balance but today noticed that she was generally weak and started to have some chest discomfort. He is not a great historian but states that the pain is in her upper left chest, she states that it feels very similar to the way she felt prior to her last stent in 2015. She notes that she gets severely dyspneic on exertion with any kind of movement, she feels better when she is lying down flat. She does endorse having some swelling of her bilateral lower extremities and does report that she is recently in the last couple of years had some difficulty with a femoral fracture status post surgery and then a redo of a knee replacement , followed up with the orthopedist yesterday.  Patient is a 70 y.o. female presenting with chest pain. The history is provided by the patient.  Chest Pain   Past Medical History  Diagnosis Date  . Asthma   . Cholelithiasis   . Colon polyps   . Type II diabetes mellitus ([place])   . GERD (gastroesophageal reflux disease)   . Hyperlipidemia   . Tobacco abuse   . Arthritis   . COPD (chronic obstructive pulmonary disease) ([facility])     USES O2 AT NIGHT + PRN IN DAYTIME3L  . Emphysema   . H/O hiatal hernia   . Hypertension   . Fibromyalgia   . Lung cancer ([place]) 2010     Adenocarcinoma, right lung, node positive  . On home oxygen therapy     "2-4L at night and prn" (07/18/2104)  . Chronic bronchitis ([place])   . FH: chemotherapy 2010     had 4 times  . Radiation 6/10    28 times right lung, and 5 treatments to sternum  . Oxygen dependent     at night  3liters  . Coronary artery disease     a. 2005 Cath/PCI: mRCA-> Cypher DES;  b. 06/2014 Cath/PCI: EF 60%. LM nl, LAD min irregs, D1 40, LCX 20p, OM2 95 (2.5x14 Resolute DES), RCA 30p, 30 ISR, 20d, PDA/PLA nl.  . Heart murmur   . Peripheral vascular disease ([place])   . Shortness of breath dyspnea     on exertion  . Pneumonia     hx of   . Stress incontinence   . Complication of anesthesia     Difficulty waking up at times from anesthesia   Past Surgical History  Procedure Laterality Date  . Appendectomy  1970  . Gallbladder surgery  1989  . Abdominal hysterectomy  1972  . Tonsillectomy  1966  . Carpal tunnel release    . Replacement total knee  2001    left  . Neck surgery  2005  . Pneumonectomy      Right Partial, lung cancer confirmed, nod positive  .  Right upper lobectomy with lymph node dissection  10/12/2008    Dr Arlyce Dice  . Coronary angioplasty  2005  . Portacath placement  12/12/2007  . Port-a-cath removal  05/12/2012    Procedure: REMOVAL PORT-A-CATH;  Surgeon: Melrose Nakayama, MD;  Location: [facility];  Service: Thoracic;  Laterality: Left;  [name] Eye surgery      implants 02/18/12 (Left) 02/25/12 (right eye)  . Rotator cuff repair      RIGHT SHOULDER  2 YRS  . Orif humerus fracture Left 12/14/2012    Procedure: OPEN REDUCTION INTERNAL FIXATION (ORIF) LEFT PROXIMAL HUMERUS FRACTURE;  Surgeon: Mcarthur Rossetti, MD;  Location: [facility];  Service: Orthopedics;  Laterality: Left;  . Abdominal hysterectomy    . Joint replacement    . Femur im nail Left 07/19/2014    Procedure: INTRAMEDULLARY (IM) NAIL FEMORAL;  Surgeon: Alta Corning, MD;  Location: [facility];  Service: Orthopedics;  Laterality: Left;   . Left heart catheterization with coronary angiogram N/A 06/20/2014    Procedure: LEFT HEART CATHETERIZATION WITH CORONARY ANGIOGRAM;  Surgeon: Troy Sine, MD;  Location: [facility] CATH [facility];  Service: Cardiovascular;  Laterality: N/A;  . Back surgery  2007  . Arm fracture Left 3/14  . Femur fracture surgery Left     10/15  . Refractive surgery Bilateral 2/14  . Total knee revision Left 07/19/2015    Procedure: Removal Intramedullary Rod and Screws Left Knee/Femur, Revision arthroplasty Left knee;  Surgeon: Mcarthur Rossetti, MD;  Location: [facility];  Service: Orthopedics;  Laterality: Left;  . Hardware removal Left 07/19/2015    Procedure: HARDWARE REMOVAL;  Surgeon: Mcarthur Rossetti, MD;  Location: [facility];  Service: Orthopedics;  Laterality: Left;   Family History  Problem Relation Age of Onset  . Diabetes Mother   . Heart failure Mother   . Diabetes Father   . CAD Father 90  . Arthritis    . Lung disease    . Cancer    . Asthma     Social History  Substance Use Topics  . Smoking status: Current Every Day Smoker -- 1.00 packs/day for 53 years    Types: Cigarettes  . Smokeless tobacco: Never Used     Comment: Put the patch on today.    . Alcohol Use: No   OB History    No data available     Review of Systems  Cardiovascular: Positive for chest pain.  All other systems reviewed and are negative.     Allergies  Percocet; Celebrex; Niacin; and Varenicline tartrate  Home Medications   Prior to Admission medications   Medication Sig Start Date End Date Taking? Authorizing Provider  Cholecalciferol (VITAMIN D) 1000 UNITS capsule Take 1,000 Units by mouth daily.    Yes Historical Provider, MD  clopidogrel (PLAVIX) 75 MG tablet TAKE ONE TABLET BY MOUTH ONCE DAILY WITH  BREAKFAST 07/24/15  Yes Minus Breeding, MD  furosemide (LASIX) 20 MG tablet Take 20 mg by mouth daily as needed for edema.   Yes Historical Provider, MD  HYDROcodone-acetaminophen (NORCO/VICODIN)  5-325 MG tablet Take 1-2 tablets by mouth every 4 (four) hours as needed for moderate pain.   Yes Historical Provider, MD  insulin aspart (NOVOLOG) 100 UNIT/ML injection Inject 15-25 Units into the skin 3 (three) times daily before meals. Units given depends on her CBG readings. < 200 gives 15 units, > 200 gives 20 units, 20-25 units at supper depending on CBG reading   Yes Historical  Provider, MD  Insulin Detemir (LEVEMIR FLEXTOUCH) 100 UNIT/ML Pen Inject 40 Units into the skin 2 (two) times daily. Patient taking differently: Inject 40-50 Units into the skin 2 (two) times daily. Inject 50 units subque in the morning and inject 40 units at night 07/21/14  Yes Verlee Monte, MD  lovastatin (MEVACOR) 40 MG tablet Take 40 mg by mouth daily after supper.   Yes Historical Provider, MD  metFORMIN (GLUCOPHAGE) 500 MG tablet Take 500 mg by mouth 2 (two) times daily with a meal.    Yes Historical Provider, MD  metoprolol tartrate (LOPRESSOR) 25 MG tablet TAKE ONE TABLET BY MOUTH TWICE DAILY Patient taking differently: TAKE ONE TABLET BY MOUTH AND 1/2 TABLET AT NIGHT 08/08/15  Yes Luke K Kilroy, PA-C  OXYGEN Inhale 2-4 L into the lungs at bedtime as needed.   Yes Historical Provider, MD  Polyethyl Glycol-Propyl Glycol (SYSTANE) 0.4-0.3 % SOLN Place 1 drop into both eyes 2 (two) times daily.    Yes Historical Provider, MD  pregabalin (LYRICA) 50 MG capsule Take 50 mg by mouth at bedtime.   Yes Historical Provider, MD  pregabalin (LYRICA) 75 MG capsule Take 75 mg by mouth every morning.   Yes Historical Provider, MD  Tiotropium Bromide Monohydrate (SPIRIVA RESPIMAT) 2.5 MCG/ACT AERS Inhale 2 puffs into the lungs daily.   Yes Historical Provider, MD  acetaminophen (TYLENOL) 500 MG tablet Take 1,000 mg by mouth every 6 (six) hours as needed for mild pain.     Historical Provider, MD  aspirin EC 81 MG EC tablet Take 1 tablet (81 mg total) by mouth daily. Patient not taking: Reported on 11/08/2015 06/21/14   Erlene Quan, PA-C  calcium carbonate (TUMS - DOSED IN MG ELEMENTAL CALCIUM) 500 MG chewable tablet Chew 3 tablets by mouth daily as needed for indigestion or heartburn.    Historical Provider, MD  esomeprazole (NEXIUM) 40 MG capsule Take 40 mg by mouth as needed (for heartburm/ acid reflux).     Historical Provider, MD  HYDROcodone-acetaminophen (NORCO/VICODIN) 5-325 MG tablet Take 1-2 tablets by mouth every 4 (four) hours as needed for moderate pain. Patient not taking: Reported on 11/08/2015 07/23/15   Mcarthur Rossetti, MD  Ipratropium-Albuterol (COMBIVENT RESPIMAT) 20-100 MCG/ACT AERS respimat Inhale 1 puff into the lungs 2 (two) times daily as needed for wheezing.    Historical Provider, MD  lisinopril (PRINIVIL,ZESTRIL) 10 MG tablet Take 1 tablet (10 mg total) by mouth 2 (two) times daily. Please schedule appointment for refills. 11/08/15   Minus Breeding, MD  nitroGLYCERIN (NITROSTAT) 0.4 MG SL tablet Place 1 tablet (0.4 mg total) under the tongue every 5 (five) minutes x 3 doses as needed for chest pain. 06/21/14   Erlene Quan, PA-C   BP 159/78 mmHg  Pulse 93  Resp 25  Ht 5' 1.5" (1.562 m)  Wt 225 lb (102.059 kg)  BMI 41.83 kg/m2  SpO2 97% Physical Exam  Constitutional: She appears well-developed and well-nourished. No distress.  HENT:  Head: Normocephalic and atraumatic.  Mouth/Throat: Oropharynx is clear and moist. No oropharyngeal exudate.  Eyes: Conjunctivae and EOM are normal. Pupils are equal, round, and reactive to light. Right eye exhibits no discharge. Left eye exhibits no discharge. No scleral icterus.  Neck: Normal range of motion. Neck supple. No JVD present. No thyromegaly present.  Cardiovascular: Regular rhythm, normal heart sounds and intact distal pulses.  Exam reveals no gallop and no friction rub.   No murmur heard.  Tachycardia to  115  Pulmonary/Chest: She is in respiratory distress. She has wheezes. She has no rales.  Abdominal: Soft. Bowel sounds are normal. She  exhibits no distension and no mass. There is no tenderness.  Musculoskeletal: Normal range of motion. She exhibits edema. She exhibits no tenderness.  Lymphadenopathy:    She has no cervical adenopathy.  Neurological: She is alert. Coordination normal.  Skin: Skin is warm and dry. No rash noted. No erythema.  Psychiatric: She has a normal mood and affect. Her behavior is normal.  Nursing note and vitals reviewed.   ED Course  Procedures (including critical care time) Labs Review Labs Reviewed  CBC WITH DIFFERENTIAL/PLATELET - Abnormal; Notable for the following:    Hemoglobin 11.7 (*)    MCH 23.7 (*)    Platelets 149 (*)    All other components within normal limits  COMPREHENSIVE METABOLIC PANEL - Abnormal; Notable for the following:    Glucose, Bld 228 (*)    ALT 12 (*)    All other components within normal limits  CBG MONITORING, ED - Abnormal; Notable for the following:    Glucose-Capillary 191 (*)    All other components within normal limits  TROPONIN I  APTT  PROTIME-INR    Imaging Review Dg Chest 2 View  11/08/2015  CLINICAL DATA:  Generalized weakness EXAM: CHEST  2 VIEW COMPARISON:  04/30/2015 FINDINGS: Cardiac shadow is stable. Mild interstitial changes are again seen bilaterally without focal confluent infiltrate. Elevation of the right hemidiaphragm is again noted. Postsurgical changes are again seen in the proximal left humerus and cervical spine. IMPRESSION: Chronic changes without acute abnormality. Electronically Signed   By: Inez Catalina M.D.   On: 11/08/2015 16:21   Ct Angio Chest Pe W/cm &/or Wo Cm  11/08/2015  CLINICAL DATA:  Chest pain, tachycardia today. Patient has a history of lung cancer with prior right upper lobectomy post chemotherapy and radiation. EXAM: CT ANGIOGRAPHY CHEST WITH CONTRAST TECHNIQUE: Multidetector CT imaging of the chest was performed using the standard protocol during bolus administration of intravenous contrast. Multiplanar CT image  reconstructions and MIPs were obtained to evaluate the vascular anatomy. CONTRAST:  165m OMNIPAQUE IOHEXOL 350 MG/ML SOLN COMPARISON:  November 02, 2013 FINDINGS: There is no pulmonary embolus. There is atherosclerosis of the aorta without evidence of aortic dissection or aneurysm. The heart size is normal. There is no pericardial effusion. Calcification of thyroid gland is unchanged. Postoperative changes of right upper lobectomy is identified. There is postradiation change of the medial right upper to mid lung unchanged compared to prior CT of 2015. There is no pulmonary nodule or mass. No focal consolidation of the lungs are identified. There are mild ground-glass mosaic ground-glass opacity of the bilateral lower lobes unchanged calcified granuloma of left upper lobe is unchanged. There is no pleural effusion. The visualized upper abdominal structures are unremarkable. There is a hiatal hernia. Distal esophageal wall thickening is unchanged. Degenerative joint changes of the spine are identified. Patient status post prior anterior fusion of cervical spine. Review of the MIP images confirms the above findings. IMPRESSION: No pulmonary embolus. Post- operative changes of right upper lobectomy with radiation fibrosis and volume loss in the right hemi thorax stable. No evidence of metastatic disease. Mosaic pulmonary parenchymal attenuation of bilateral lung bases, chronic, unchanged compared to prior exam. Electronically Signed   By: WAbelardo DieselM.D.   On: 11/08/2015 19:48   I have personally reviewed and evaluated these images and lab results as part of my  medical decision-making.   EKG Interpretation   Date/Time:  Thursday November 08 2015 15:56:03 EST Ventricular Rate:  92 PR Interval:  198 QRS Duration: 80 QT Interval:  358 QTC Calculation: 442 R Axis:   -47 Text Interpretation:  Normal sinus rhythm Left axis deviation Intervals  normal Left anterior fasicular block since last tracing no  significant  change Confirmed by Addilynne Olheiser  MD, Mychal Durio ([id number]) on 11/08/2015 4:37:21 PM      MDM   Final diagnoses:  Chest pain, unspecified chest pain type     the patient does appear to be dyspneic, her vital signs show that she is significant hypertensive but she does state that she did not use her blood pressure medicine this morning because of her home measured mild hypotension at 660 systolic or thereabouts. I am concerned for several things, #1 the patient could have recurrent coronary artery disease with obstructive pathology, her EKG does not show any significant abnormalities compared to prior. 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 The patient also has  Dyspnea that could be related to COPD, I do not hear overt wheezing to suggest that this would be the source of her severe symptoms. She does not have any rales to suggest pulmonary edema, she has not been on her Plavix and surgery which is many months, she may have pulmonary embolism given recent surgery and swelling of the legs.   thankfully the patient's CT scan shows no signs of pulmonary embolism, lab work overall has not shown any etiology of the patient's pain, I have strongly recommended to the patient multiple times that she needs to be admitted to the hospital for further evaluation since she has not been taking her anticoagulation and has had recurrent pain similar to what she had with prior unstable angina. She has refused, she will need to leave [place], she is able to adequately tell me what would happen should she leave and have a heart attack. I believe she has the capacity to make his decisions at this time.  Noemi Chapel, MD 11/08/15 2036

## 2015-11-08 NOTE — ED Notes (Signed)
Patient ambulatory to restroom with steady gait.

## 2015-11-08 NOTE — ED Notes (Signed)
Pt sent from PCP due to hypotension, generalized weakness, chest pain

## 2015-11-08 NOTE — ED Notes (Signed)
Patient given discharge instruction, verbalized understand. IV removed, band aid applied. Patient wheelchair out of the department.  

## 2015-11-09 ENCOUNTER — Encounter: Payer: Self-pay | Admitting: Cardiology

## 2015-11-09 ENCOUNTER — Encounter (HOSPITAL_COMMUNITY)
Admission: AD | Disposition: A | Discharge status: Home / Self Care | Payer: Self-pay | Source: Ambulatory Visit | Attending: Cardiology

## 2015-11-09 ENCOUNTER — Encounter (HOSPITAL_COMMUNITY): Payer: Self-pay | Admitting: Cardiology

## 2015-11-09 ENCOUNTER — Ambulatory Visit (INDEPENDENT_AMBULATORY_CARE_PROVIDER_SITE_OTHER): Payer: Medicare Other | Admitting: Cardiology

## 2015-11-09 ENCOUNTER — Encounter (HOSPITAL_COMMUNITY): Payer: Self-pay | Admitting: General Practice

## 2015-11-09 ENCOUNTER — Inpatient Hospital Stay (HOSPITAL_COMMUNITY)
Admission: AD | Admit: 2015-11-09 | Discharge: 2015-11-10 | DRG: 247 | Disposition: A | Discharge status: Home / Self Care | Payer: Medicare Other | Source: Ambulatory Visit | Attending: Cardiology | Admitting: Cardiology

## 2015-11-09 ENCOUNTER — Telehealth: Payer: Self-pay | Admitting: Cardiology

## 2015-11-09 VITALS — BP 120/72 | HR 74 | Ht 61.5 in | Wt 221.8 lb

## 2015-11-09 DIAGNOSIS — J449 Chronic obstructive pulmonary disease, unspecified: Secondary | ICD-10-CM | POA: Diagnosis not present

## 2015-11-09 DIAGNOSIS — E119 Type 2 diabetes mellitus without complications: Secondary | ICD-10-CM

## 2015-11-09 DIAGNOSIS — K219 Gastro-esophageal reflux disease without esophagitis: Secondary | ICD-10-CM | POA: Diagnosis present

## 2015-11-09 DIAGNOSIS — E785 Hyperlipidemia, unspecified: Secondary | ICD-10-CM

## 2015-11-09 DIAGNOSIS — Y831 Surgical operation with implant of artificial internal device as the cause of abnormal reaction of the patient, or of later complication, without mention of misadventure at the time of the procedure: Secondary | ICD-10-CM | POA: Diagnosis present

## 2015-11-09 DIAGNOSIS — I10 Essential (primary) hypertension: Secondary | ICD-10-CM | POA: Diagnosis not present

## 2015-11-09 DIAGNOSIS — Z8249 Family history of ischemic heart disease and other diseases of the circulatory system: Secondary | ICD-10-CM

## 2015-11-09 DIAGNOSIS — J4489 Other specified chronic obstructive pulmonary disease: Secondary | ICD-10-CM

## 2015-11-09 DIAGNOSIS — Z794 Long term (current) use of insulin: Secondary | ICD-10-CM | POA: Diagnosis not present

## 2015-11-09 DIAGNOSIS — I25110 Atherosclerotic heart disease of native coronary artery with unstable angina pectoris: Secondary | ICD-10-CM | POA: Diagnosis not present

## 2015-11-09 DIAGNOSIS — I200 Unstable angina: Secondary | ICD-10-CM | POA: Diagnosis not present

## 2015-11-09 DIAGNOSIS — Z9221 Personal history of antineoplastic chemotherapy: Secondary | ICD-10-CM

## 2015-11-09 DIAGNOSIS — Z955 Presence of coronary angioplasty implant and graft: Secondary | ICD-10-CM

## 2015-11-09 DIAGNOSIS — R079 Chest pain, unspecified: Secondary | ICD-10-CM | POA: Diagnosis present

## 2015-11-09 DIAGNOSIS — Z9981 Dependence on supplemental oxygen: Secondary | ICD-10-CM

## 2015-11-09 DIAGNOSIS — F17210 Nicotine dependence, cigarettes, uncomplicated: Secondary | ICD-10-CM | POA: Diagnosis present

## 2015-11-09 DIAGNOSIS — F17200 Nicotine dependence, unspecified, uncomplicated: Secondary | ICD-10-CM | POA: Diagnosis not present

## 2015-11-09 DIAGNOSIS — J45909 Unspecified asthma, uncomplicated: Secondary | ICD-10-CM | POA: Diagnosis present

## 2015-11-09 DIAGNOSIS — M797 Fibromyalgia: Secondary | ICD-10-CM | POA: Diagnosis present

## 2015-11-09 DIAGNOSIS — T82855A Stenosis of coronary artery stent, initial encounter: Principal | ICD-10-CM | POA: Diagnosis present

## 2015-11-09 DIAGNOSIS — Z833 Family history of diabetes mellitus: Secondary | ICD-10-CM

## 2015-11-09 DIAGNOSIS — Z85118 Personal history of other malignant neoplasm of bronchus and lung: Secondary | ICD-10-CM

## 2015-11-09 DIAGNOSIS — Z96652 Presence of left artificial knee joint: Secondary | ICD-10-CM | POA: Diagnosis present

## 2015-11-09 DIAGNOSIS — I2510 Atherosclerotic heart disease of native coronary artery without angina pectoris: Secondary | ICD-10-CM | POA: Diagnosis not present

## 2015-11-09 DIAGNOSIS — Z6841 Body Mass Index (BMI) 40.0 and over, adult: Secondary | ICD-10-CM

## 2015-11-09 HISTORY — PX: CARDIAC CATHETERIZATION: SHX172

## 2015-11-09 LAB — POCT ACTIVATED CLOTTING TIME
Activated Clotting Time: 234 seconds
Activated Clotting Time: 379 seconds
Activated Clotting Time: 214 seconds

## 2015-11-09 LAB — CBC WITH DIFFERENTIAL/PLATELET
BASOS ABS: 0 10*3/uL (ref 0.0–0.1)
Basophils Relative: 0 %
EOS ABS: 0.2 10*3/uL (ref 0.0–0.7)
EOS PCT: 3 %
HCT: 38.5 % (ref 36.0–46.0)
HEMOGLOBIN: 12 g/dL (ref 12.0–15.0)
LYMPHS ABS: 1.7 10*3/uL (ref 0.7–4.0)
LYMPHS PCT: 29 %
MCH: 24.2 pg — AB (ref 26.0–34.0)
MCHC: 31.2 g/dL (ref 30.0–36.0)
MCV: 77.6 fL — ABNORMAL LOW (ref 78.0–100.0)
Monocytes Absolute: 0.3 10*3/uL (ref 0.1–1.0)
Monocytes Relative: 6 %
NEUTROS PCT: 62 %
Neutro Abs: 3.7 10*3/uL (ref 1.7–7.7)
PLATELETS: 155 10*3/uL (ref 150–400)
RBC: 4.96 MIL/uL (ref 3.87–5.11)
RDW: 15.2 % (ref 11.5–15.5)
WBC: 5.9 10*3/uL (ref 4.0–10.5)

## 2015-11-09 LAB — BASIC METABOLIC PANEL
ANION GAP: 10 (ref 5–15)
BUN: 12 mg/dL (ref 6–20)
CO2: 27 mmol/L (ref 22–32)
Calcium: 9.4 mg/dL (ref 8.9–10.3)
Chloride: 103 mmol/L (ref 101–111)
Creatinine, Ser: 0.82 mg/dL (ref 0.44–1.00)
GFR calc Af Amer: >60 mL/min (ref >60)
GLUCOSE: 206 mg/dL — AB (ref 65–99)
POTASSIUM: 4.5 mmol/L (ref 3.5–5.1)
Sodium: 140 mmol/L (ref 135–145)

## 2015-11-09 LAB — PROTIME-INR
INR: 0.99 (ref 0.00–1.49)
Prothrombin Time: 13.3 seconds (ref 11.6–15.2)

## 2015-11-09 LAB — TSH: TSH: 0.563 u[IU]/mL (ref 0.350–4.500)

## 2015-11-09 LAB — MAGNESIUM: Magnesium: 1.8 mg/dL (ref 1.7–2.4)

## 2015-11-09 LAB — MRSA PCR SCREENING: MRSA BY PCR: POSITIVE — AB

## 2015-11-09 LAB — OCCULT BLOOD X 1 CARD TO LAB, STOOL: FECAL OCCULT BLD: NEGATIVE

## 2015-11-09 LAB — TROPONIN I: Troponin I: 0.05 ng/mL — ABNORMAL HIGH (ref <0.031)

## 2015-11-09 LAB — GLUCOSE, CAPILLARY: Glucose-Capillary: 178 mg/dL — ABNORMAL HIGH (ref 65–99)

## 2015-11-09 LAB — BRAIN NATRIURETIC PEPTIDE: B NATRIURETIC PEPTIDE 5: 18.2 pg/mL (ref 0.0–100.0)

## 2015-11-09 SURGERY — LEFT HEART CATH AND CORONARY ANGIOGRAPHY
Anesthesia: LOCAL | Laterality: Right

## 2015-11-09 MED ORDER — SODIUM CHLORIDE 0.9 % IV SOLN: INTRAVENOUS | Status: DC

## 2015-11-09 MED ORDER — METOPROLOL TARTRATE 12.5 MG HALF TABLET
12.5000 mg | ORAL_TABLET | Freq: Every day | ORAL | Status: DC
  Administered 2015-11-09: 20:00:00 12.5 mg via ORAL
  Filled 2015-11-09: qty 1

## 2015-11-09 MED ORDER — FENTANYL CITRATE (PF) 100 MCG/2ML IJ SOLN
INTRAMUSCULAR | Status: DC | PRN
  Administered 2015-11-09 (×4): 50 ug via INTRAVENOUS

## 2015-11-09 MED ORDER — FENTANYL CITRATE (PF) 100 MCG/2ML IJ SOLN
INTRAMUSCULAR | Status: AC
  Filled 2015-11-09: qty 2

## 2015-11-09 MED ORDER — ASPIRIN 81 MG PO CHEW
81.0000 mg | CHEWABLE_TABLET | Freq: Every day | ORAL | Status: DC
  Administered 2015-11-09 – 2015-11-10 (×2): 81 mg via ORAL
  Filled 2015-11-09 (×2): qty 1

## 2015-11-09 MED ORDER — INSULIN ASPART 100 UNIT/ML ~~LOC~~ SOLN
0.0000 [IU] | Freq: Three times a day (TID) | SUBCUTANEOUS | Status: DC
  Administered 2015-11-10 (×2): 3 [IU] via SUBCUTANEOUS

## 2015-11-09 MED ORDER — ALPRAZOLAM 0.25 MG PO TABS: 0.2500 mg | ORAL_TABLET | Freq: Two times a day (BID) | ORAL | Status: DC | PRN

## 2015-11-09 MED ORDER — LISINOPRIL 10 MG PO TABS
10.0000 mg | ORAL_TABLET | Freq: Two times a day (BID) | ORAL | Status: DC
  Administered 2015-11-09 – 2015-11-10 (×2): 10 mg via ORAL
  Filled 2015-11-09 (×2): qty 1

## 2015-11-09 MED ORDER — NITROGLYCERIN 1 MG/10 ML FOR IR/CATH LAB
INTRA_ARTERIAL | Status: DC | PRN
  Administered 2015-11-09: 200 ug via INTRACORONARY

## 2015-11-09 MED ORDER — NITROGLYCERIN 2 % TD OINT
1.0000 [in_us] | TOPICAL_OINTMENT | Freq: Four times a day (QID) | TRANSDERMAL | Status: DC
  Administered 2015-11-09: 1 [in_us] via TOPICAL
  Filled 2015-11-09: qty 30

## 2015-11-09 MED ORDER — METOPROLOL TARTRATE 25 MG PO TABS
25.0000 mg | ORAL_TABLET | Freq: Every day | ORAL | Status: DC
  Administered 2015-11-10: 11:00:00 25 mg via ORAL
  Filled 2015-11-09: qty 1

## 2015-11-09 MED ORDER — LIDOCAINE HCL (PF) 1 % IJ SOLN
INTRAMUSCULAR | Status: DC | PRN
  Administered 2015-11-09: 5 mL

## 2015-11-09 MED ORDER — POLYVINYL ALCOHOL 1.4 % OP SOLN
1.0000 [drp] | Freq: Two times a day (BID) | OPHTHALMIC | Status: DC
  Administered 2015-11-09 – 2015-11-10 (×3): 1 [drp] via OPHTHALMIC
  Filled 2015-11-09 (×2): qty 15

## 2015-11-09 MED ORDER — SODIUM CHLORIDE 0.9 % WEIGHT BASED INFUSION
1.0000 mL/kg/h | INTRAVENOUS | Status: AC
  Administered 2015-11-09: 1 mL/kg/h via INTRAVENOUS

## 2015-11-09 MED ORDER — METOPROLOL TARTRATE 25 MG PO TABS: 25.0000 mg | ORAL_TABLET | Freq: Two times a day (BID) | ORAL | Status: DC

## 2015-11-09 MED ORDER — NITROGLYCERIN 1 MG/10 ML FOR IR/CATH LAB
INTRA_ARTERIAL | Status: DC | PRN
  Administered 2015-11-09: 200 ug via INTRACORONARY
  Administered 2015-11-09 (×2): 200 ug via INTRA_ARTERIAL

## 2015-11-09 MED ORDER — INSULIN ASPART 100 UNIT/ML ~~LOC~~ SOLN: 15.0000 [IU] | Freq: Three times a day (TID) | SUBCUTANEOUS | Status: DC

## 2015-11-09 MED ORDER — SODIUM CHLORIDE 0.9% FLUSH: 3.0000 mL | INTRAVENOUS | Status: DC | PRN

## 2015-11-09 MED ORDER — INSULIN DETEMIR 100 UNIT/ML ~~LOC~~ SOLN
40.0000 [IU] | Freq: Every day | SUBCUTANEOUS | Status: DC
  Administered 2015-11-10: 01:00:00 40 [IU] via SUBCUTANEOUS
  Filled 2015-11-09 (×3): qty 0.4

## 2015-11-09 MED ORDER — ASPIRIN 81 MG PO CHEW: 81.0000 mg | CHEWABLE_TABLET | ORAL | Status: DC

## 2015-11-09 MED ORDER — PREGABALIN 25 MG PO CAPS
50.0000 mg | ORAL_CAPSULE | Freq: Every day | ORAL | Status: DC
Start: 2015-11-09 — End: 2015-11-10
  Administered 2015-11-09: 50 mg via ORAL
  Filled 2015-11-09: qty 2

## 2015-11-09 MED ORDER — HEPARIN (PORCINE) IN NACL 2-0.9 UNIT/ML-% IJ SOLN
INTRAMUSCULAR | Status: AC
  Filled 2015-11-09: qty 1000

## 2015-11-09 MED ORDER — ADENOSINE (DIAGNOSTIC) 140MCG/KG/MIN
INTRAVENOUS | Status: DC | PRN
  Administered 2015-11-09: 140 ug/kg/min via INTRAVENOUS

## 2015-11-09 MED ORDER — CALCIUM CARBONATE ANTACID 500 MG PO CHEW
3.0000 | CHEWABLE_TABLET | Freq: Every day | ORAL | Status: DC | PRN
  Filled 2015-11-09: qty 3

## 2015-11-09 MED ORDER — ONDANSETRON HCL 4 MG/2ML IJ SOLN: 4.0000 mg | Freq: Four times a day (QID) | INTRAMUSCULAR | Status: DC | PRN

## 2015-11-09 MED ORDER — HEPARIN SODIUM (PORCINE) 1000 UNIT/ML IJ SOLN
INTRAMUSCULAR | Status: DC | PRN
  Administered 2015-11-09: 3000 [IU] via INTRAVENOUS
  Administered 2015-11-09: 5000 [IU] via INTRAVENOUS
  Administered 2015-11-09: 2500 [IU] via INTRAVENOUS
  Administered 2015-11-09: 2000 [IU] via INTRAVENOUS

## 2015-11-09 MED ORDER — NICOTINE 14 MG/24HR TD PT24
14.0000 mg | MEDICATED_PATCH | Freq: Every day | TRANSDERMAL | Status: DC
  Administered 2015-11-09 – 2015-11-10 (×2): 14 mg via TRANSDERMAL
  Filled 2015-11-09 (×2): qty 1

## 2015-11-09 MED ORDER — IPRATROPIUM-ALBUTEROL 0.5-2.5 (3) MG/3ML IN SOLN
3.0000 mL | RESPIRATORY_TRACT | Status: DC
Start: 2015-11-09 — End: 2015-11-10
  Administered 2015-11-09 – 2015-11-10 (×4): 3 mL via RESPIRATORY_TRACT
  Filled 2015-11-09 (×4): qty 3

## 2015-11-09 MED ORDER — ALBUTEROL SULFATE (2.5 MG/3ML) 0.083% IN NEBU: 2.5000 mg | INHALATION_SOLUTION | RESPIRATORY_TRACT | Status: DC

## 2015-11-09 MED ORDER — SODIUM CHLORIDE 0.9% FLUSH: 3.0000 mL | Freq: Two times a day (BID) | INTRAVENOUS | Status: DC

## 2015-11-09 MED ORDER — NITROGLYCERIN 0.4 MG SL SUBL: 0.4000 mg | SUBLINGUAL_TABLET | SUBLINGUAL | Status: DC | PRN

## 2015-11-09 MED ORDER — ASPIRIN 81 MG PO CHEW
324.0000 mg | CHEWABLE_TABLET | ORAL | Status: AC
  Administered 2015-11-09: 324 mg via ORAL
  Filled 2015-11-09: qty 4

## 2015-11-09 MED ORDER — IPRATROPIUM BROMIDE 0.02 % IN SOLN: 0.5000 mg | RESPIRATORY_TRACT | Status: DC

## 2015-11-09 MED ORDER — ADENOSINE 12 MG/4ML IV SOLN
16.0000 mL | INTRAVENOUS | Status: DC
  Filled 2015-11-09: qty 16

## 2015-11-09 MED ORDER — NITROGLYCERIN IN D5W 200-5 MCG/ML-% IV SOLN
15.0000 ug/min | INTRAVENOUS | Status: DC
  Administered 2015-11-09: 15 ug/min via INTRAVENOUS

## 2015-11-09 MED ORDER — ONDANSETRON HCL 4 MG/2ML IJ SOLN
4.0000 mg | Freq: Four times a day (QID) | INTRAMUSCULAR | Status: DC | PRN
Start: 2015-11-09 — End: 2015-11-09

## 2015-11-09 MED ORDER — HEPARIN (PORCINE) IN NACL 100-0.45 UNIT/ML-% IJ SOLN
900.0000 [IU]/h | INTRAMUSCULAR | Status: DC
  Filled 2015-11-09: qty 250

## 2015-11-09 MED ORDER — SODIUM CHLORIDE 0.9 % IV SOLN: 250.0000 mL | INTRAVENOUS | Status: DC | PRN

## 2015-11-09 MED ORDER — ACETAMINOPHEN 325 MG PO TABS
650.0000 mg | ORAL_TABLET | ORAL | Status: DC | PRN
  Administered 2015-11-10: 650 mg via ORAL
  Filled 2015-11-09: qty 2

## 2015-11-09 MED ORDER — HEPARIN SODIUM (PORCINE) 1000 UNIT/ML IJ SOLN
INTRAMUSCULAR | Status: AC
  Filled 2015-11-09: qty 1

## 2015-11-09 MED ORDER — PANTOPRAZOLE SODIUM 40 MG PO TBEC
40.0000 mg | DELAYED_RELEASE_TABLET | Freq: Every day | ORAL | Status: DC
Start: 2015-11-09 — End: 2015-11-10
  Administered 2015-11-09 – 2015-11-10 (×2): 40 mg via ORAL
  Filled 2015-11-09 (×2): qty 1

## 2015-11-09 MED ORDER — NITROGLYCERIN 1 MG/10 ML FOR IR/CATH LAB
INTRA_ARTERIAL | Status: AC
  Filled 2015-11-09: qty 10

## 2015-11-09 MED ORDER — VERAPAMIL HCL 2.5 MG/ML IV SOLN
INTRAVENOUS | Status: AC
  Filled 2015-11-09: qty 2

## 2015-11-09 MED ORDER — ASPIRIN EC 81 MG PO TBEC
81.0000 mg | DELAYED_RELEASE_TABLET | Freq: Every day | ORAL | Status: DC
  Filled 2015-11-09: qty 1

## 2015-11-09 MED ORDER — LIDOCAINE HCL (PF) 1 % IJ SOLN
INTRAMUSCULAR | Status: AC
  Filled 2015-11-09: qty 30

## 2015-11-09 MED ORDER — INSULIN DETEMIR 100 UNIT/ML ~~LOC~~ SOLN
50.0000 [IU] | Freq: Every day | SUBCUTANEOUS | Status: DC
  Administered 2015-11-10: 50 [IU] via SUBCUTANEOUS
  Filled 2015-11-09: qty 0.5

## 2015-11-09 MED ORDER — MIDAZOLAM HCL 2 MG/2ML IJ SOLN
INTRAMUSCULAR | Status: AC
  Filled 2015-11-09: qty 2

## 2015-11-09 MED ORDER — PREGABALIN 25 MG PO CAPS
75.0000 mg | ORAL_CAPSULE | ORAL | Status: DC
  Administered 2015-11-10: 75 mg via ORAL
  Filled 2015-11-09: qty 3

## 2015-11-09 MED ORDER — CLOPIDOGREL BISULFATE 300 MG PO TABS
ORAL_TABLET | ORAL | Status: AC
  Filled 2015-11-09: qty 1

## 2015-11-09 MED ORDER — CLOPIDOGREL BISULFATE 300 MG PO TABS
ORAL_TABLET | ORAL | Status: DC | PRN
  Administered 2015-11-09: 600 mg via ORAL

## 2015-11-09 MED ORDER — NITROGLYCERIN IN D5W 200-5 MCG/ML-% IV SOLN
INTRAVENOUS | Status: DC | PRN
  Administered 2015-11-09: 15 ug/min via INTRAVENOUS

## 2015-11-09 MED ORDER — PRAVASTATIN SODIUM 40 MG PO TABS: 40.0000 mg | ORAL_TABLET | Freq: Every day | ORAL | Status: DC

## 2015-11-09 MED ORDER — SODIUM CHLORIDE 0.9 % IV SOLN
INTRAVENOUS | Status: DC
  Administered 2015-11-09: 16:00:00 via INTRAVENOUS

## 2015-11-09 MED ORDER — ZOLPIDEM TARTRATE 5 MG PO TABS: 5.0000 mg | ORAL_TABLET | Freq: Every evening | ORAL | Status: DC | PRN

## 2015-11-09 MED ORDER — HEPARIN BOLUS VIA INFUSION
4000.0000 [IU] | Freq: Once | INTRAVENOUS | Status: DC
  Filled 2015-11-09: qty 4000

## 2015-11-09 MED ORDER — ACETAMINOPHEN 325 MG PO TABS
650.0000 mg | ORAL_TABLET | ORAL | Status: DC | PRN
  Administered 2015-11-09: 650 mg via ORAL
  Filled 2015-11-09: qty 2

## 2015-11-09 MED ORDER — HEPARIN (PORCINE) IN NACL 2-0.9 UNIT/ML-% IJ SOLN
INTRAMUSCULAR | Status: DC | PRN
  Administered 2015-11-09: 17:00:00

## 2015-11-09 MED ORDER — OXYCODONE-ACETAMINOPHEN 5-325 MG PO TABS: 1.0000 | ORAL_TABLET | ORAL | Status: DC | PRN

## 2015-11-09 MED ORDER — IOHEXOL 350 MG/ML SOLN
INTRAVENOUS | Status: DC | PRN
  Administered 2015-11-09: 270 mL via INTRA_ARTERIAL

## 2015-11-09 MED ORDER — ASPIRIN 300 MG RE SUPP: 300.0000 mg | RECTAL | Status: AC

## 2015-11-09 MED ORDER — INFLUENZA VAC SPLIT QUAD 0.5 ML IM SUSY
0.5000 mL | PREFILLED_SYRINGE | INTRAMUSCULAR | Status: DC
  Filled 2015-11-09: qty 0.5

## 2015-11-09 MED ORDER — CLOPIDOGREL BISULFATE 75 MG PO TABS
75.0000 mg | ORAL_TABLET | Freq: Every day | ORAL | Status: DC
  Administered 2015-11-10: 08:00:00 75 mg via ORAL
  Filled 2015-11-09: qty 1

## 2015-11-09 MED ORDER — FLUTICASONE FUROATE-VILANTEROL 100-25 MCG/INH IN AEPB: 1.0000 | INHALATION_SPRAY | Freq: Every day | RESPIRATORY_TRACT | Status: DC

## 2015-11-09 MED ORDER — VERAPAMIL HCL 2.5 MG/ML IV SOLN
INTRAVENOUS | Status: DC | PRN
  Administered 2015-11-09: 8 mL via INTRA_ARTERIAL

## 2015-11-09 MED ORDER — INSULIN ASPART 100 UNIT/ML ~~LOC~~ SOLN: 0.0000 [IU] | Freq: Every day | SUBCUTANEOUS | Status: DC

## 2015-11-09 MED ORDER — MIDAZOLAM HCL 2 MG/2ML IJ SOLN
INTRAMUSCULAR | Status: DC | PRN
  Administered 2015-11-09 (×3): 1 mg via INTRAVENOUS

## 2015-11-09 SURGICAL SUPPLY — 23 items
BALLN ANGIOSCULPT RX 2.5X6 (BALLOONS) ×3
BALLN ~~LOC~~ EUPHORA RX 2.5X20 (BALLOONS) ×3
BALLN ~~LOC~~ EUPHORA RX 3.25X20 (BALLOONS) ×3
BALLOON ANGIOSCULPT RX 2.5X6 (BALLOONS) IMPLANT
BALLOON ~~LOC~~ EUPHORA RX 2.5X20 (BALLOONS) IMPLANT
BALLOON ~~LOC~~ EUPHORA RX 3.25X20 (BALLOONS) IMPLANT
CATH INFINITI 5 FR JL3.5 (CATHETERS) ×3 IMPLANT
CATH INFINITI JR4 5F (CATHETERS) ×3 IMPLANT
CATH MICROCATH NAVVUS (MICROCATHETER) IMPLANT
DEVICE RAD COMP TR BAND LRG (VASCULAR PRODUCTS) ×3 IMPLANT
GLIDESHEATH SLEND A-KIT 6F 22G (SHEATH) ×3 IMPLANT
GUIDE CATH RUNWAY 6FR FR4 (CATHETERS) ×1 IMPLANT
KIT ENCORE 26 ADVANTAGE (KITS) ×1 IMPLANT
KIT ESSENTIALS PG (KITS) ×1 IMPLANT
KIT HEART LEFT (KITS) ×3 IMPLANT
MICROCATHETER NAVVUS (MICROCATHETER) ×3
PACK CARDIAC CATHETERIZATION (CUSTOM PROCEDURE TRAY) ×3 IMPLANT
STENT SYNERGY DES 3.5X16 (Permanent Stent) ×1 IMPLANT
STENT SYNERGY DES 3X38 (Permanent Stent) ×1 IMPLANT
TRANSDUCER W/STOPCOCK (MISCELLANEOUS) ×3 IMPLANT
TUBING CIL FLEX 10 FLL-RA (TUBING) ×3 IMPLANT
WIRE HI TORQ BMW 190CM (WIRE) ×1 IMPLANT
WIRE SAFE-T 1.5MM-J .035X260CM (WIRE) ×3 IMPLANT

## 2015-11-09 NOTE — Interval H&P Note (Signed)
Cath Lab Visit (complete for each Cath Lab visit)  Clinical Evaluation Leading to the Procedure:   ACS: Yes.    Non-ACS:    Anginal Classification: CCS III  Anti-ischemic medical therapy: Maximal Therapy (2 or more classes of medications)  Non-Invasive Test Results: No non-invasive testing performed  Prior CABG: No prior CABG      History and Physical Interval Note:  11/09/2015 3:46 PM  [name]  has presented today for surgery, with the diagnosis of cp  The various methods of treatment have been discussed with the patient and family. After consideration of risks, benefits and other options for treatment, the patient has consented to  Procedure(s): Left Heart Cath and Coronary Angiography (N/A) as a surgical intervention .  The patient's history has been reviewed, patient examined, no change in status, stable for surgery.  I have reviewed the patient's chart and labs.  Questions were answered to the patient's satisfaction.     Sinclair Grooms

## 2015-11-09 NOTE — Telephone Encounter (Signed)
Returned call.  Pt made appt today after being in ER several hours. She went for CP evaluation but did not wait to have repeat troponins done.   Pt had 2 blockages last year, 1 of which was stented, 1 which was not. She thinks the unstented blockage may be at issue. Pt c/o left sided chest discomfort, weakness, nervousness. Clarified pt knew where appt is today. She understands that if she is having these symptoms she needs to go to ER, however she has refused to go.   Reminded her of Nitro use - she has not tried yet. She will use to see if this helps in interim.

## 2015-11-09 NOTE — Progress Notes (Signed)
Pt arrived to 2W as direct admit from Dr. Gabriel Carina d/t chest pain.  Pt placed and telemetry and CCMD notified of Pt arrival.  Pt oriented to room including telephone and call light.  Pt denies chest pain at this time.  Will cont to monitor.

## 2015-11-09 NOTE — Telephone Encounter (Signed)
New message     Patient calling went to emergency room last night @ [facility]  Was advise to stay the night to do two more blood test - patient did not want to stay.  Was advise she might have another blockage.     C/O still having problem - jittery, weak . Left sided of chest discomfort.    Patient is aware appt is made with APP today @ 11 am  message is route to triage nurse for review.

## 2015-11-09 NOTE — Progress Notes (Signed)
ANTICOAGULATION CONSULT NOTE - Initial Consult  Pharmacy Consult for heparin Indication: chest pain/ACS  Allergies  Allergen Reactions  . Percocet [Oxycodone-Acetaminophen] Shortness Of Breath  . Celebrex [Celecoxib] Palpitations and Other (See Comments)    Chest pain, tachycardia, diaphoresis  . Niacin Rash  . Varenicline Tartrate Rash    Reaction to [facility]    Patient Measurements: Height: 5' 1.5" (156.2 cm) Weight: 219 lb 8 oz (99.565 kg) IBW/kg (Calculated) : 48.95 Heparin Dosing Weight: 73kg  Vital Signs: Temp: 97.7 F (36.5 C) (02/03 1252) Temp Source: Oral (02/03 1252) BP: 135/68 mmHg (02/03 1252) Pulse Rate: 79 (02/03 1252)  Labs:  Recent Labs  11/08/15 1706 11/09/15 1330  HGB 11.7* 12.0  HCT 38.7 38.5  PLT 149* 155  APTT 28  --   LABPROT 13.0 13.3  INR 0.96 0.99  CREATININE 0.79 0.82  TROPONINI <0.03 <0.03    Estimated Creatinine Clearance: 70.7 mL/min (by C-G formula based on Cr of 0.82).   Medical History: Past Medical History  Diagnosis Date  . Asthma   . Cholelithiasis   . Colon polyps   . Type II diabetes mellitus ([place])   . GERD (gastroesophageal reflux disease)   . Hyperlipidemia   . Tobacco abuse   . Arthritis   . COPD (chronic obstructive pulmonary disease) ([facility])     USES O2 AT NIGHT + PRN IN DAYTIME3L  . Emphysema   . H/O hiatal hernia   . Hypertension   . Fibromyalgia   . Lung cancer ([place]) 2010    Adenocarcinoma, right lung, node positive  . On home oxygen therapy     "2-4L at night and prn" (07/18/2104)  . Chronic bronchitis ([place])   . FH: chemotherapy 2010     had 4 times  . Radiation 6/10    28 times right lung, and 5 treatments to sternum  . Oxygen dependent     at night  3liters  . Coronary artery disease     a. 2005 Cath/PCI: mRCA-> Cypher DES;  b. 06/2014 Cath/PCI: EF 60%. LM nl, LAD min irregs, D1 40, LCX 20p, OM2 95 (2.5x14 Resolute DES), RCA 30p, 30 ISR, 20d, PDA/PLA nl.  . Heart murmur   . Peripheral vascular  disease ([place])   . Shortness of breath dyspnea     on exertion  . Pneumonia     hx of   . Stress incontinence   . Complication of anesthesia     Difficulty waking up at times from anesthesia     Assessment: 70 year old female seen in cardiology office post ER visit for chest pain and signed out AMA. Extensive cardiac history including DES to Cfx and OM in 2015, RCA stent on '05. She is only taking asa regarding antiplatelet agents. Now admitted to [facility] for possible cath this afternoon as add-on.   Hemoccult was done stat and is negative, INR and cbc appear normal. CE's negative. Orders were to start IV heparin if occult was negative so will place those orders now.  Goal of Therapy:  Heparin level 0.3-0.7 units/ml Monitor platelets by anticoagulation protocol: Yes   Plan:  Give 4000 units bolus x 1 Start heparin infusion at 900 units/hr Check anti-Xa level in 8 hours and daily while on heparin or follow up after cath Continue to monitor H&H and platelets  Erin Hearing PharmD., BCPS Clinical Pharmacist Pager [phone] 11/09/2015 3:00 PM

## 2015-11-09 NOTE — H&P (View-Only) (Signed)
I performed rectal exam to check for blood in stool in the presence of nurse. Result negative, cath today  Hilbert Corrigan PA Pager: [phone]

## 2015-11-09 NOTE — Telephone Encounter (Signed)
Yes, i saw and admitted, she is having cath now. Thanks.

## 2015-11-09 NOTE — Progress Notes (Signed)
Cardiology Office Note   Date:  11/09/2015   ID:  [name], DOB [birth year], MRN [id number]  PCP:  Rita Neighbors, MD  Cardiologist:  Dr. Percival [name]    Chief Complaint  Patient presents with  . Weakness    seen in ED 11/08/15  . jittery  . Chest Pain      History of Present Illness: [name] is a 70 y.o. female who presents for post ER visit for chest pain and signed out AMA.   Labs troponin <0.03, glucose elevated 228, Cr. 0.79.   EKG  SR with LAFB but no new changes since previous EKGs.   She has known coronary disease. September 2015 she was admitted with unstable angina. At that time she had a DES stent to the circumflex acute marginal and OM 2 branch.EF was 60%.  Previous stent to RCA in 2005.   Other history of tobacco abuse, diabetes mellitus, type II, GERD, COPD, history of adenocarcinoma of the right lung.  Her CXR yesterday was chronic changes without acute abnormality.  CT Chest no PE or aortic dissection.   Today continues with the episodic chest pain that began on Wed this week and now occurs several times a day.  After return from ER she slept but was awakened at 0400 with the chest pressure and burning she was diaphoretic and SOB.  Nausea at times as well.    Only other abnormality is on arrival home she had black stool. She is not on Iron.  No abd pain and CBC yesterday was normal.    She has been on oxygen at night but since total knee surgery in Oct. 2016 she has been on oxygen 24/7 and has DOE.  Her chronic SOB increases after she begins having chest discomfort.     Past Medical History  Diagnosis Date  . Asthma   . Cholelithiasis   . Colon polyps   . Type II diabetes mellitus ([name])   . GERD (gastroesophageal reflux disease)   . Hyperlipidemia   . Tobacco abuse   . Arthritis   . COPD (chronic obstructive pulmonary disease) ([facility])     USES O2 AT NIGHT + PRN IN DAYTIME3L  . Emphysema   . H/O hiatal hernia   . Hypertension   . Fibromyalgia   .  Lung cancer ([name]) 2010    Adenocarcinoma, right lung, node positive  . On home oxygen therapy     "2-4L at night and prn" (07/18/2104)  . Chronic bronchitis ([name])   . FH: chemotherapy 2010     had 4 times  . Radiation 6/10    28 times right lung, and 5 treatments to sternum  . Oxygen dependent     at night  3liters  . Coronary artery disease     a. 2005 Cath/PCI: mRCA-> Cypher DES;  b. 06/2014 Cath/PCI: EF 60%. LM nl, LAD min irregs, D1 40, LCX 20p, OM2 95 (2.5x14 Resolute DES), RCA 30p, 30 ISR, 20d, PDA/PLA nl.  . Heart murmur   . Peripheral vascular disease ([place])   . Shortness of breath dyspnea     on exertion  . Pneumonia     hx of   . Stress incontinence   . Complication of anesthesia     Difficulty waking up at times from anesthesia    Past Surgical History  Procedure Laterality Date  . Appendectomy  1970  . Gallbladder surgery  1989  . Abdominal hysterectomy  1972  . Tonsillectomy  1966  . Carpal tunnel release    . Replacement total knee  2001    left  . Neck surgery  2005  . Pneumonectomy      Right Partial, lung cancer confirmed, nod positive  . Right upper lobectomy with lymph node dissection  10/12/2008    Dr Arlyce Dice  . Coronary angioplasty  2005  . Portacath placement  12/12/2007  . Port-a-cath removal  05/12/2012    Procedure: REMOVAL PORT-A-CATH;  Surgeon: Melrose Nakayama, MD;  Location: [name];  Service: Thoracic;  Laterality: Left;  [name] Eye surgery      implants 02/18/12 (Left) 02/25/12 (right eye)  . Rotator cuff repair      RIGHT SHOULDER  2 YRS  . Orif humerus fracture Left 12/14/2012    Procedure: OPEN REDUCTION INTERNAL FIXATION (ORIF) LEFT PROXIMAL HUMERUS FRACTURE;  Surgeon: Mcarthur Rossetti, MD;  Location: [name];  Service: Orthopedics;  Laterality: Left;  . Abdominal hysterectomy    . Joint replacement    . Femur im nail Left 07/19/2014    Procedure: INTRAMEDULLARY (IM) NAIL FEMORAL;  Surgeon: Alta Corning, MD;  Location: [name];  Service:  Orthopedics;  Laterality: Left;  . Left heart catheterization with coronary angiogram N/A 06/20/2014    Procedure: LEFT HEART CATHETERIZATION WITH CORONARY ANGIOGRAM;  Surgeon: Troy Sine, MD;  [facility] C[facility];  Service: Cardiovascular;  Laterality: N/A;  . Back surgery  2007  . Arm fracture Left 3/14  . Femur fracture surgery Left     10/15  . Refractive surgery Bilateral 2/14  . Total knee revision Left 07/19/2015    Procedure: Removal Intramedullary Rod and Screws Left Knee/Femur, Revision arthroplasty Left knee;  Surgeon: Mcarthur Rossetti, MD;  Location: [facility];  Service: Orthopedics;  Laterality: Left;  . Hardware removal Left 07/19/2015    Procedure: HARDWARE REMOVAL;  Surgeon: Mcarthur Rossetti, MD;  Location: [facility];  Service: Orthopedics;  Laterality: Left;     Current Outpatient Prescriptions  Medication Sig Dispense Refill  . acetaminophen (TYLENOL) 500 MG tablet Take 1,000 mg by mouth every 6 (six) hours as needed for mild pain.     . calcium carbonate (TUMS - DOSED IN MG ELEMENTAL CALCIUM) 500 MG chewable tablet Chew 3 tablets by mouth daily as needed for indigestion or heartburn.    . Cholecalciferol (VITAMIN D) 1000 UNITS capsule Take 1,000 Units by mouth daily.     [name] esomeprazole (NEXIUM) 40 MG capsule Take 40 mg by mouth as needed (for heartburm/ acid reflux).     . Fluticasone Furoate-Vilanterol (BREO ELLIPTA) 100-25 MCG/INH AEPB Inhale 1 puff into the lungs daily.    . furosemide (LASIX) 20 MG tablet Take 20 mg by mouth daily as needed for edema.    [name] HYDROcodone-acetaminophen (NORCO/VICODIN) 5-325 MG tablet Take 1-2 tablets by mouth every 4 (four) hours as needed for moderate pain.    [name] insulin aspart (NOVOLOG) 100 UNIT/ML injection Inject 15-25 Units into the skin 3 (three) times daily before meals. Units given depends on her CBG readings. < 200 gives 15 units, > 200 gives 20 units, 20-25 units at supper depending on CBG reading    . Insulin Detemir  (LEVEMIR FLEXPEN) 100 UNIT/ML Pen Inject 40-50 Units into the skin as directed. Inject 50 units in the morning and 40 units at night    . Ipratropium-Albuterol (COMBIVENT RESPIMAT) 20-100 MCG/ACT AERS respimat Inhale 1 puff into the lungs 2 (two) times daily as needed  for wheezing.    [name] lisinopril (PRINIVIL,ZESTRIL) 10 MG tablet Take 1 tablet (10 mg total) by mouth 2 (two) times daily. Please schedule appointment for refills. 60 tablet 0  . lovastatin (MEVACOR) 40 MG tablet Take 40 mg by mouth daily after supper.    . metFORMIN (GLUCOPHAGE) 500 MG tablet Take 500 mg by mouth 2 (two) times daily with a meal.     . metoprolol tartrate (LOPRESSOR) 25 MG tablet Take 25 mg by mouth 2 (two) times daily. Take one tablet by mouth in the morning and one half tablet in the evening    . nitroGLYCERIN (NITROSTAT) 0.4 MG SL tablet Place 1 tablet (0.4 mg total) under the tongue every 5 (five) minutes x 3 doses as needed for chest pain. 25 tablet 2  . OXYGEN Inhale 2-4 L into the lungs at bedtime as needed.    Vladimir Faster Glycol-Propyl Glycol (SYSTANE) 0.4-0.3 % SOLN Place 1 drop into both eyes 2 (two) times daily.     . pregabalin (LYRICA) 50 MG capsule Take 50 mg by mouth at bedtime.    . pregabalin (LYRICA) 75 MG capsule Take 75 mg by mouth every morning.     No current facility-administered medications for this visit.    Allergies:   Percocet; Celebrex; Niacin; and Varenicline tartrate    Social History:  The patient  reports that she has been smoking Cigarettes.  She has a 53 pack-year smoking history. She has never used smokeless tobacco. She reports that she does not drink alcohol or use illicit drugs.   Family History:  The patient's family history includes CAD (age of onset: 13) in her father; Diabetes in her father and mother; Heart failure in her mother.    ROS:  General:no colds or fevers, per our scales wt decreased. Per pt she felt she has gained weight. Skin:no rashes or ulcers HEENT:no  blurred vision, no congestion CV:see HPI PUL:see HPI GI:no diarrhea dark stools last pm. No constipation ? melena, no indigestion GU:no hematuria, no dysuria MS:no joint pain, no claudication Neuro:no syncope, no lightheadedness Endo:+ diabetes had been controlled but now elevated, no thyroid disease  Wt Readings from Last 3 Encounters:  11/09/15 221 lb 12.8 oz (100.608 kg)  11/08/15 225 lb (102.059 kg)  07/19/15 226 lb (102.513 kg)     PHYSICAL EXAM: VS:  BP 120/72 mmHg  Pulse 74  Ht 5' 1.5" (1.562 m)  Wt 221 lb 12.8 oz (100.608 kg)  BMI 41.24 kg/m2 , BMI Body mass index is 41.24 kg/(m^2). General:Pleasant affect, NAD but DOE. Skin:Warm and dry, brisk capillary refill HEENT:normocephalic, sclera clear, mucus membranes moist Neck:supple, no JVD, no bruits  Heart:S1S2 RRR without murmur, gallup, rub or click Lungs:without rales, +rhonchi, and wheezes LPF:XTKW, non tender, + BS, do not palpate liver spleen or masses Ext:no lower ext edema, tr+ pedal pulses, 2+ radial pulses Neuro:alert and oriented X 3, MAE, follows commands, + facial symmetry    EKG:  EKG is ordered today. The ekg ordered today demonstrates SR normal EKG   Recent Labs: 11/08/2015: ALT 12*; BUN 20; Creatinine, Ser 0.79; Hemoglobin 11.7*; Platelets 149*; Potassium 4.4; Sodium 138    Lipid Panel    Component Value Date/Time   CHOL 194 06/20/2014 0306   TRIG 340* 06/20/2014 0306   HDL 40 06/20/2014 0306   CHOLHDL 4.9 06/20/2014 0306   VLDL 68* 06/20/2014 0306   LDLCALC 86 06/20/2014 0306       Other studies Reviewed: Additional studies/  records that were reviewed today include: previous notes, ER notes labs.   ASSESSMENT AND PLAN:  1.  Unstable angina occuring several times a day now woke her at 0400 from sleep.  Pressure then burning pain. Associated with SOB diaphoresis, nausea.  Admit with plan for cardiac cath this PM depending on follow up labs.  Will do serial troponins and add heparin if  stool is neg for hemoccult.  Will add NTG paste.  Discussed with Dr. Meda Coffee.   2. CAD last stent 06/2014 DES stent to the circumflex acute marginal and OM 2 branch.EF was 60%.  Previous stent to RCA in 2005.    3. Tobacco use had stopped in Oct but now back to 1 ppd. Will add Nicoderm patch for now she does wish to stop.  4. COPD on home oxygen.  Will do nebs on arrival to [facility]. She may need wedge with cath.  5. Discussed cardiac cath and pt willing to proceed.   6. Hyperlipidemia on statin   Current medicines are reviewed with the patient today.  The patient Has no concerns regarding medicines.  The following changes have been made:  See above Labs/ tests ordered today include:see above  Disposition:   FU:  see above  Signed, Isaiah Serge, NP  11/09/2015 11:56 AM    [facility] [place], [place], [place] [address] [place] [place], [place] Phone: [phone]; Fax: [phone]

## 2015-11-09 NOTE — H&P (Addendum)
HISTORY AND PHYSICAL   ID: [name], DOB 08-06-1946, MRN [id number]  PCP: Rita Neighbors, MD Cardiologist: Dr. Percival [name]   Chief Complaint  Patient presents with  . Weakness    seen in ED 11/08/15  . jittery  . Chest Pain     History of Present Illness: [name] is a 70 y.o. female who presents for post ER visit for chest pain and signed out AMA. Labs troponin <0.03, glucose elevated 228, Cr. 0.79.        EKG SR with LAFB but no new changes since previous EKGs.   She has known coronary disease. September 2015 she was admitted with unstable angina. At that time she had a DES stent to the circumflex acute marginal and OM 2 branch.EF was 60%. Previous stent to RCA in 2005.   Other history of tobacco abuse, diabetes mellitus, type II, GERD, COPD, history of adenocarcinoma of the right lung.  Her CXR yesterday was chronic changes without acute abnormality. CT Chest no PE or aortic dissection.   Today continues with the episodic chest pain that began on Wed this week and now occurs several times a day. After return from ER she slept but was awakened at 0400 with the chest pressure and burning she was diaphoretic and SOB. Nausea at times as well.   Only other abnormality is on arrival home she had black stool. She is not on Iron. No abd pain and CBC yesterday was normal.   She has been on oxygen at night but since total knee surgery in Oct. 2016 she has been on oxygen 24/7 and has DOE. Her chronic SOB increases after she begins having chest discomfort.   Past Medical History  Diagnosis Date  . Asthma   . Cholelithiasis   . Colon polyps   . Type II diabetes mellitus ([place])   . GERD (gastroesophageal reflux disease)   . Hyperlipidemia   . Tobacco abuse   . Arthritis   . COPD (chronic obstructive pulmonary disease) (HCC)     USES O2 AT NIGHT + PRN IN DAYTIME3L  . Emphysema   . H/O hiatal  hernia   . Hypertension   . Fibromyalgia   . Lung cancer ([place]) 2010    Adenocarcinoma, right lung, node positive  . On home oxygen therapy     "2-4L at night and prn" (07/18/2104)  . Chronic bronchitis ([place])   . FH: chemotherapy 2010    had 4 times  . Radiation 6/10    28 times right lung, and 5 treatments to sternum  . Oxygen dependent     at night 3liters  . Coronary artery disease     a. 2005 Cath/PCI: mRCA-> Cypher DES; b. 06/2014 Cath/PCI: EF 60%. LM nl, LAD min irregs, D1 40, LCX 20p, OM2 95 (2.5x14 Resolute DES), RCA 30p, 30 ISR, 20d, PDA/PLA nl.  . Heart murmur   . Peripheral vascular disease ([place])   . Shortness of breath dyspnea     on exertion  . Pneumonia     hx of   . Stress incontinence   . Complication of anesthesia     Difficulty waking up at times from anesthesia    Past Surgical History  Procedure Laterality Date  . Appendectomy  1970  . Gallbladder surgery  1989  . Abdominal hysterectomy  1972  . Tonsillectomy  1966  . Carpal tunnel release    . Replacement total knee  2001    left  .  Neck surgery  2005  . Pneumonectomy      Right Partial, lung cancer confirmed, nod positive  . Right upper lobectomy with lymph node dissection  10/12/2008    Dr Arlyce Dice  . Coronary angioplasty  2005  . Portacath placement  12/12/2007  . Port-a-cath removal  05/12/2012    Procedure: REMOVAL PORT-A-CATH; Surgeon: Melrose Nakayama, MD; Location: [facility]; Service: Thoracic; Laterality: Left;  [name] Eye surgery      implants 02/18/12 (Left) 02/25/12 (right eye)  . Rotator cuff repair      RIGHT SHOULDER 2 YRS  . Orif humerus fracture Left 12/14/2012    Procedure: OPEN REDUCTION INTERNAL FIXATION (ORIF) LEFT PROXIMAL HUMERUS FRACTURE; Surgeon: Mcarthur Rossetti, MD; Location: [facility]; Service: Orthopedics;  Laterality: Left;  . Abdominal hysterectomy    . Joint replacement    . Femur im nail Left 07/19/2014    Procedure: INTRAMEDULLARY (IM) NAIL FEMORAL; Surgeon: Alta Corning, MD; Location: [facility]; Service: Orthopedics; Laterality: Left;  . Left heart catheterization with coronary angiogram N/A 06/20/2014    Procedure: LEFT HEART CATHETERIZATION WITH CORONARY ANGIOGRAM; Surgeon: Troy Sine, MD; Location: [facility] CATH LAB; Service: Cardiovascular; Laterality: N/A;  . Back surgery  2007  . Arm fracture Left 3/14  . Femur fracture surgery Left     10/15  . Refractive surgery Bilateral 2/14  . Total knee revision Left 07/19/2015    Procedure: Removal Intramedullary Rod and Screws Left Knee/Femur, Revision arthroplasty Left knee; Surgeon: Mcarthur Rossetti, MD; Location: [facility]; Service: Orthopedics; Laterality: Left;  . Hardware removal Left 07/19/2015    Procedure: HARDWARE REMOVAL; Surgeon: Mcarthur Rossetti, MD; Location: [facility]; Service: Orthopedics; Laterality: Left;     Current Outpatient Prescriptions  Medication Sig Dispense Refill  . acetaminophen (TYLENOL) 500 MG tablet Take 1,000 mg by mouth every 6 (six) hours as needed for mild pain.     . calcium carbonate (TUMS - DOSED IN MG ELEMENTAL CALCIUM) 500 MG chewable tablet Chew 3 tablets by mouth daily as needed for indigestion or heartburn.    . Cholecalciferol (VITAMIN D) 1000 UNITS capsule Take 1,000 Units by mouth daily.     [name] esomeprazole (NEXIUM) 40 MG capsule Take 40 mg by mouth as needed (for heartburm/ acid reflux).     . Fluticasone Furoate-Vilanterol (BREO ELLIPTA) 100-25 MCG/INH AEPB Inhale 1 puff into the lungs daily.    . furosemide (LASIX) 20 MG tablet Take 20 mg by mouth daily as needed for edema.    [name] HYDROcodone-acetaminophen (NORCO/VICODIN) 5-325 MG tablet Take 1-2 tablets by mouth every 4 (four) hours  as needed for moderate pain.    [name] insulin aspart (NOVOLOG) 100 UNIT/ML injection Inject 15-25 Units into the skin 3 (three) times daily before meals. Units given depends on her CBG readings. < 200 gives 15 units, > 200 gives 20 units, 20-25 units at supper depending on CBG reading    . Insulin Detemir (LEVEMIR FLEXPEN) 100 UNIT/ML Pen Inject 40-50 Units into the skin as directed. Inject 50 units in the morning and 40 units at night    . Ipratropium-Albuterol (COMBIVENT RESPIMAT) 20-100 MCG/ACT AERS respimat Inhale 1 puff into the lungs 2 (two) times daily as needed for wheezing.    [name] lisinopril (PRINIVIL,ZESTRIL) 10 MG tablet Take 1 tablet (10 mg total) by mouth 2 (two) times daily. Please schedule appointment for refills. 60 tablet 0  . lovastatin (MEVACOR) 40 MG tablet Take 40 mg by mouth daily after supper.    [name]  metFORMIN (GLUCOPHAGE) 500 MG tablet Take 500 mg by mouth 2 (two) times daily with a meal.     . metoprolol tartrate (LOPRESSOR) 25 MG tablet Take 25 mg by mouth 2 (two) times daily. Take one tablet by mouth in the morning and one half tablet in the evening    . nitroGLYCERIN (NITROSTAT) 0.4 MG SL tablet Place 1 tablet (0.4 mg total) under the tongue every 5 (five) minutes x 3 doses as needed for chest pain. 25 tablet 2  . OXYGEN Inhale 2-4 L into the lungs at bedtime as needed.    Vladimir Faster Glycol-Propyl Glycol (SYSTANE) 0.4-0.3 % SOLN Place 1 drop into both eyes 2 (two) times daily.     . pregabalin (LYRICA) 50 MG capsule Take 50 mg by mouth at bedtime.    . pregabalin (LYRICA) 75 MG capsule Take 75 mg by mouth every morning.     No current facility-administered medications for this visit.    Allergies: Percocet; Celebrex; Niacin; and Varenicline tartrate    Social History: The patient  reports that she has been smoking Cigarettes. She has a 53 pack-year smoking history. She has never used smokeless tobacco. She  reports that she does not drink alcohol or use illicit drugs.   Family History: The patient's family history includes CAD (age of onset: 35) in her father; Diabetes in her father and mother; Heart failure in her mother.    ROS: General:no colds or fevers, per our scales wt decreased. Per pt she felt she has gained weight. Skin:no rashes or ulcers HEENT:no blurred vision, no congestion CV:see HPI PUL:see HPI GI:no diarrhea dark stools last pm. No constipation ? melena, no indigestion GU:no hematuria, no dysuria MS:no joint pain, no claudication Neuro:no syncope, no lightheadedness Endo:+ diabetes had been controlled but now elevated, no thyroid disease  Wt Readings from Last 3 Encounters:  11/09/15 221 lb 12.8 oz (100.608 kg)  11/08/15 225 lb (102.059 kg)  07/19/15 226 lb (102.513 kg)     PHYSICAL EXAM: VS: BP 120/72 mmHg  Pulse 74  Ht 5' 1.5" (1.562 m)  Wt 221 lb 12.8 oz (100.608 kg)  BMI 41.24 kg/m2 , BMI Body mass index is 41.24 kg/(m^2). General:Pleasant affect, NAD but DOE. Skin:Warm and dry, brisk capillary refill HEENT:normocephalic, sclera clear, mucus membranes moist Neck:supple, no JVD, no bruits  Heart:S1S2 RRR without murmur, gallup, rub or click Lungs:without rales, +rhonchi, and wheezes WCB:JSEG, non tender, + BS, do not palpate liver spleen or masses Ext:no lower ext edema, tr+ pedal pulses, 2+ radial pulses Neuro:alert and oriented X 3, MAE, follows commands, + facial symmetry    EKG: EKG is ordered today. The ekg ordered today demonstrates SR normal EKG   Recent Labs: 11/08/2015: ALT 12*; BUN 20; Creatinine, Ser 0.79; Hemoglobin 11.7*; Platelets 149*; Potassium 4.4; Sodium 138    Lipid Panel  Labs (Brief)       Component Value Date/Time   CHOL 194 06/20/2014 0306   TRIG 340* 06/20/2014 0306   HDL 40 06/20/2014 0306   CHOLHDL 4.9 06/20/2014 0306   VLDL 68* 06/20/2014 0306   LDLCALC 86 06/20/2014 0306         Other studies Reviewed: Additional studies/ records that were reviewed today include: previous notes, ER notes labs.   ASSESSMENT AND PLAN:  1. Unstable angina occuring several times a day now woke her at 0400 from sleep. Pressure then burning pain. Associated with SOB diaphoresis, nausea. Admit with plan for cardiac cath this PM depending  on follow up labs. Will do serial troponins and add heparin if stool is neg for hemoccult. Will add NTG paste. Discussed with Dr. Meda Coffee.   2. CAD last stent 06/2014 DES stent to the circumflex acute marginal and OM 2 branch.EF was 60%. Previous stent to RCA in 2005.   3. Tobacco use had stopped in Oct but now back to 1 ppd. Will add Nicoderm patch for now she does wish to stop.  4. COPD on home oxygen. Will do nebs on arrival to [facility]. She may need wedge with cath.  5. Discussed cardiac cath and pt willing to proceed.   6. Hyperlipidemia on statin   Current medicines are reviewed with the patient today. The patient Has no concerns regarding medicines.         The patient was seen, examined and discussed with Cecilie Kicks, NP and I agree with the above.   70 year old female with known CAD, s/p PCI/DES stent to thecircumflex acute marginal and OM 2 branch.EF was 60%. Previous stent to RCA in 2005. She presented with new onset angina that feels the same as before, went to the ER yesterday but left AMA, presented to the clinic with the same exertional chest pain today. ECG is unchanged. We will admit for a cath today. She reported black stools, but hemocult negative. Crea 0.79.  Dorothy Spark 11/09/2015

## 2015-11-09 NOTE — Progress Notes (Signed)
I performed rectal exam to check for blood in stool in the presence of nurse. Result negative, cath today  Hilbert Corrigan PA Pager: [phone]

## 2015-11-10 ENCOUNTER — Encounter (HOSPITAL_COMMUNITY): Payer: Self-pay | Admitting: Cardiology

## 2015-11-10 DIAGNOSIS — I25110 Atherosclerotic heart disease of native coronary artery with unstable angina pectoris: Secondary | ICD-10-CM | POA: Insufficient documentation

## 2015-11-10 LAB — TROPONIN I: Troponin I: 0.05 ng/mL — ABNORMAL HIGH (ref <0.031)

## 2015-11-10 LAB — CBC
HCT: 35.3 % — ABNORMAL LOW (ref 36.0–46.0)
HEMOGLOBIN: 10.7 g/dL — AB (ref 12.0–15.0)
MCH: 23.4 pg — AB (ref 26.0–34.0)
MCHC: 30.3 g/dL (ref 30.0–36.0)
MCV: 77.1 fL — ABNORMAL LOW (ref 78.0–100.0)
PLATELETS: 136 10*3/uL — AB (ref 150–400)
RBC: 4.58 MIL/uL (ref 3.87–5.11)
RDW: 15.1 % (ref 11.5–15.5)
WBC: 5.2 10*3/uL (ref 4.0–10.5)

## 2015-11-10 LAB — BASIC METABOLIC PANEL
ANION GAP: 12 (ref 5–15)
BUN: 10 mg/dL (ref 6–20)
CALCIUM: 8.8 mg/dL — AB (ref 8.9–10.3)
CO2: 21 mmol/L — AB (ref 22–32)
CREATININE: 0.81 mg/dL (ref 0.44–1.00)
Chloride: 105 mmol/L (ref 101–111)
GFR calc Af Amer: >60 mL/min (ref >60)
GFR calc non Af Amer: >60 mL/min (ref >60)
GLUCOSE: 195 mg/dL — AB (ref 65–99)
Potassium: 3.9 mmol/L (ref 3.5–5.1)
Sodium: 138 mmol/L (ref 135–145)

## 2015-11-10 LAB — LIPID PANEL
CHOLESTEROL: 126 mg/dL (ref 0–200)
HDL: 32 mg/dL — AB (ref >40)
LDL Cholesterol: 36 mg/dL (ref 0–99)
Total CHOL/HDL Ratio: 3.9 RATIO
Triglycerides: 289 mg/dL — ABNORMAL HIGH (ref <150)
VLDL: 58 mg/dL — ABNORMAL HIGH (ref 0–40)

## 2015-11-10 LAB — HEMOGLOBIN A1C
Hgb A1c MFr Bld: 8.1 % — ABNORMAL HIGH (ref 4.8–5.6)
Mean Plasma Glucose: 186 mg/dL

## 2015-11-10 LAB — GLUCOSE, CAPILLARY
Glucose-Capillary: 207 mg/dL — ABNORMAL HIGH (ref 65–99)
Glucose-Capillary: 244 mg/dL — ABNORMAL HIGH (ref 65–99)

## 2015-11-10 MED ORDER — CLOPIDOGREL BISULFATE 75 MG PO TABS: 75.0000 mg | ORAL_TABLET | Freq: Every day | ORAL | Status: AC

## 2015-11-10 MED ORDER — IPRATROPIUM-ALBUTEROL 0.5-2.5 (3) MG/3ML IN SOLN: 3.0000 mL | Freq: Two times a day (BID) | RESPIRATORY_TRACT | Status: DC

## 2015-11-10 MED ORDER — METFORMIN HCL 500 MG PO TABS: 500.0000 mg | ORAL_TABLET | Freq: Two times a day (BID) | ORAL | Status: DC

## 2015-11-10 MED ORDER — ANGIOPLASTY BOOK
Freq: Once | Status: AC
  Administered 2015-11-10: 01:00:00
  Filled 2015-11-10: qty 1

## 2015-11-10 NOTE — Discharge Summary (Signed)
Discharge Summary    Patient ID: [name],  MRN: [id number], DOB/AGE: 1945/10/28 70 y.o.  Admit date: 11/09/2015 Discharge date: 11/10/2015  Primary Care Provider: [name] Primary Cardiologist: Hochrein   Discharge Diagnoses    Active Problems:   Type II diabetes mellitus ([place])   Hyperlipidemia LDL goal <70   Tobacco abuse   Coronary artery disease   Hypertension   Morbid obesity ([place])   Coronary artery disease involving native coronary artery of native heart with unstable angina pectoris (HCC)   Allergies Allergies  Allergen Reactions  . Percocet [Oxycodone-Acetaminophen] Shortness Of Breath  . Celebrex [Celecoxib] Palpitations and Other (See Comments)    Chest pain, tachycardia, diaphoresis  . Niacin Rash  . Varenicline Tartrate Rash    Reaction to [facility]    Diagnostic Studies/Procedures    Procedures    Coronary Stent Intervention   Intravascular Pressure Wire/FFR Study   Left Heart Cath and Coronary Angiography    Conclusion     Moderate to severe coronary artery disease with 50% stenosis in the mid LAD, 40% first diagonal, 50% stenosis in the proximal- mid circumflex, widely patent stent in the mid circumflex, moderate re-stenosis within the mid segment of the right coronary, and new 85% eccentric stenosis in large left ventricular branch of the right coronary.   We felt that the right coronary was the possible culprit for presentation and performed FFR with pullback across the distal lesion within the left ventricular branch of the right coronary. The FFR across the left ventricular branch was 0.64. Pullback across the lesion revealed FFR 0.76 (suggesting significant obstruction across the stented mid right coronary).   Successful Angiosculpt scoring balloon PCI on the left ventricular branch of the right coronary reducing the stenosis from 85% to 25% with TIMI grade 3 flow.   Successful angioplasty and restenting of the entire proximal to  distal right coronary region of restenosis , reducing obstruction from 80% to 10% with TIMI grade 3 flow.   Normal left ventricular function, LVEDP 14 mmHg, and EF greater than 50%.   RECOMMENDATIONS:    IV hydration   Dual antiplatelet therapy 12 months   Potential discharge in a.m. if no renal impairment or other concerns arise.    Diagnostic Diagram                                                     Post-Intervention Diagram                 _____________   History of Present Illness   [name] is a 70 y.o. female who presented for post ER visit for chest pain and signed out AMA. Labs troponin <0.03, glucose elevated 228, Cr. 0.79.   EKG SR with LAFB but no new changes since previous EKGs.   She has known coronary disease. September 2015 she was admitted with unstable angina. At that time she had a DES stent to thecircumflex acute marginal and OM 2 branch.EF was 60%. Previous stent to RCA in 2005.   Other history of tobacco abuse, diabetes mellitus, type II, GERD, COPD, history of adenocarcinoma of the right lung.  Her CXR showed chronic changes without acute abnormality. CT Chest no PE or aortic dissection.   On 11/09/15 she continued with the episodic chest pain that began  on Wed this week and now occurs several times a day. After return from ER she slept but was awakened at 0400 with the chest pressure and burning she was diaphoretic and SOB. Nausea at times as well.  Only other abnormality is on arrival home she had black stool. She is not on Iron. No abd pain and CBC yesterday was normal.   She has been on oxygen at night but since total knee surgery in Oct. 2016 she has been on oxygen 24/7 and has DOE. Her chronic SOB increases after she begins having chest discomfort.    Hospital Course     Patient was admitted and underwent left heart catheterization revealing severe in-stent restenosis in the right coronary artery. The entire proximal to  distal right coronary was restented. She also had angiosculpt scoring balloon PCI on the left ventricular branch of the right coronary reducing the stenosis from 85% to 25% with TIMI grade 3 flow.  Dual anti-platelet with aspirin and Plavix for at least a year. She is also on pravastatin 40 mg, metoprolol tartrate 12.5 mg daily at bedtime, lisinopril 10 mg twice daily. Post cath serum creatinine was within normal limits. BP 113/48-176/89. We gave AM meds before DC. She had a rectal exam which was heme negative.  Tobacco cessation discussed along with low carb and Na diet. Follow-up in office for medication titration. She ambulated this morning with Cardiac Rehab 250 feet, on 2L O2 ,which was the farthest she has walked since her knee surgery a year ago. Phase II cardiac rehabilitation was discussed.  The patient was seen by Dr. Wynonia Lawman who felt she was stable for DC home.    Consultants: Cardiac Rehab   _____________  Discharge Vitals Blood pressure 156/65, pulse 86, temperature 97.5 F (36.4 C), temperature source Oral, resp. rate 20, height 5' 1.5" (1.562 m), weight 219 lb 5.7 oz (99.5 kg), SpO2 92 %.  Filed Weights   11/09/15 1252 11/10/15 0453  Weight: 219 lb 8 oz (99.565 kg) 219 lb 5.7 oz (99.5 kg)    Labs & Radiologic Studies     CBC  Recent Labs  11/08/15 1706 11/09/15 1330 11/10/15 0125  WBC 5.9 5.9 5.2  NEUTROABS 3.3 3.7  --   HGB 11.7* 12.0 10.7*  HCT 38.7 38.5 35.3*  MCV 78.5 77.6* 77.1*  PLT 149* 155 284*   Basic Metabolic Panel  Recent Labs  11/09/15 1330 11/10/15 0125  NA 140 138  K 4.5 3.9  CL 103 105  CO2 27 21*  GLUCOSE 206* 195*  BUN 12 10  CREATININE 0.82 0.81  CALCIUM 9.4 8.8*  MG 1.8  --    Liver Function Tests  Recent Labs  11/08/15 1706  AST 22  ALT 12*  ALKPHOS 105  BILITOT 0.3  PROT 7.7  ALBUMIN 4.0   No results for input(s): LIPASE, AMYLASE in the last 72 hours. Cardiac Enzymes  Recent Labs  11/09/15 1330  11/09/15 1919 11/10/15 0125  TROPONINI <0.03 0.05* 0.05*   BNP Invalid input(s): POCBNP D-Dimer No results for input(s): DDIMER in the last 72 hours. Hemoglobin A1C  Recent Labs  11/09/15 1330  HGBA1C 8.1*   Fasting Lipid Panel  Recent Labs  11/10/15 0125  CHOL 126  HDL 32*  LDLCALC 36  TRIG 289*  CHOLHDL 3.9   Thyroid Function Tests  Recent Labs  11/09/15 1330  TSH 0.563    Dg Chest 2 View  11/08/2015  CLINICAL DATA:  Generalized weakness EXAM: CHEST  2 VIEW COMPARISON:  04/30/2015 FINDINGS: Cardiac shadow is stable. Mild interstitial changes are again seen bilaterally without focal confluent infiltrate. Elevation of the right hemidiaphragm is again noted. Postsurgical changes are again seen in the proximal left humerus and cervical spine. IMPRESSION: Chronic changes without acute abnormality. Electronically Signed   By: Inez Catalina M.D.   On: 11/08/2015 16:21   Ct Angio Chest Pe W/cm &/or Wo Cm  11/08/2015  CLINICAL DATA:  Chest pain, tachycardia today. Patient has a history of lung cancer with prior right upper lobectomy post chemotherapy and radiation. EXAM: CT ANGIOGRAPHY CHEST WITH CONTRAST TECHNIQUE: Multidetector CT imaging of the chest was performed using the standard protocol during bolus administration of intravenous contrast. Multiplanar CT image reconstructions and MIPs were obtained to evaluate the vascular anatomy. CONTRAST:  176m OMNIPAQUE IOHEXOL 350 MG/ML SOLN COMPARISON:  November 02, 2013 FINDINGS: There is no pulmonary embolus. There is atherosclerosis of the aorta without evidence of aortic dissection or aneurysm. The heart size is normal. There is no pericardial effusion. Calcification of thyroid gland is unchanged. Postoperative changes of right upper lobectomy is identified. There is postradiation change of the medial right upper to mid lung unchanged compared to prior CT of 2015. There is no pulmonary nodule or mass. No focal consolidation of the lungs  are identified. There are mild ground-glass mosaic ground-glass opacity of the bilateral lower lobes unchanged calcified granuloma of left upper lobe is unchanged. There is no pleural effusion. The visualized upper abdominal structures are unremarkable. There is a hiatal hernia. Distal esophageal wall thickening is unchanged. Degenerative joint changes of the spine are identified. Patient status post prior anterior fusion of cervical spine. Review of the MIP images confirms the above findings. IMPRESSION: No pulmonary embolus. Post- operative changes of right upper lobectomy with radiation fibrosis and volume loss in the right hemi thorax stable. No evidence of metastatic disease. Mosaic pulmonary parenchymal attenuation of bilateral lung bases, chronic, unchanged compared to prior exam. Electronically Signed   By: WAbelardo DieselM.D.   On: 11/08/2015 19:48    Disposition   Pt is being discharged home today in good condition.  Follow-up Plans & Appointments    Follow-up Information    Follow up with JMinus Breeding MD.   Specialty:  Cardiology   Why:  The office will call you with the follow up appt date and time   Contact information:   [phone]     Discharge Instructions    AMB Referral to Cardiac Rehabilitation - Phase II    Complete by:  As directed   Diagnosis:  PCI     Amb Referral to Cardiac Rehabilitation    Complete by:  As directed   Diagnosis:  PCI     Diet - low sodium heart healthy    Complete by:  As directed      Discharge instructions    Complete by:  As directed   No lifting with right arm for 3 days.     Increase activity slowly    Complete by:  As directed            Discharge Medications   Current Discharge Medication List    START taking these medications   Details  clopidogrel (PLAVIX) 75 MG tablet Take 1 tablet (75 mg total) by mouth daily with breakfast. Qty: 30 tablet, Refills: 11       CONTINUE these medications which have CHANGED  Details  metFORMIN (GLUCOPHAGE) 500 MG tablet Take 1 tablet (500 mg total) by mouth 2 (two) times daily with a meal.      CONTINUE these medications which have NOT CHANGED   Details  acetaminophen (TYLENOL) 500 MG tablet Take 1,000 mg by mouth every 6 (six) hours as needed for mild pain.     albuterol (PROVENTIL) (2.5 MG/3ML) 0.083% nebulizer solution Take 2.5 mg by nebulization every 6 (six) hours as needed for wheezing or shortness of breath.    aspirin EC 81 MG tablet Take 81 mg by mouth 2 (two) times daily.    calcium carbonate (TUMS - DOSED IN MG ELEMENTAL CALCIUM) 500 MG chewable tablet Chew 2-3 tablets by mouth daily as needed for indigestion or heartburn.     Cholecalciferol (VITAMIN D) 1000 UNITS capsule Take 1,000 Units by mouth daily.     esomeprazole (NEXIUM) 40 MG capsule Take 40 mg by mouth as needed (for heartburn/ acid reflux).     Fluticasone Furoate-Vilanterol (BREO ELLIPTA) 100-25 MCG/INH AEPB Inhale 1 puff into the lungs daily.    furosemide (LASIX) 20 MG tablet Take 20 mg by mouth daily as needed (feet, ankle and wrist swelling).     HYDROcodone-acetaminophen (NORCO/VICODIN) 5-325 MG tablet Take 1-2 tablets by mouth every 4 (four) hours as needed for moderate pain.    insulin aspart (NOVOLOG) 100 UNIT/ML injection Inject 15-25 Units into the skin 3 (three) times daily before meals. Per sliding scale: CBG <200 15 units, 200-260 20 units, >260 25 units    insulin detemir (LEVEMIR) 100 UNIT/ML injection Inject 40-50 Units into the skin 2 (two) times daily. Inject 50 units subcutaneously every morning and 40 units at bedtime    Ipratropium-Albuterol (COMBIVENT RESPIMAT) 20-100 MCG/ACT AERS respimat Inhale 1 puff into the lungs 2 (two) times daily as needed for wheezing.    lisinopril (PRINIVIL,ZESTRIL) 10 MG tablet Take 1 tablet (10 mg total) by mouth 2 (two) times daily. Please schedule appointment for refills. Qty:  60 tablet, Refills: 0    lovastatin (MEVACOR) 40 MG tablet Take 40 mg by mouth daily after supper.    metoprolol tartrate (LOPRESSOR) 25 MG tablet Take 12.5-25 mg by mouth 2 (two) times daily. Take 1 tablet (25 mg) by mouth every morning and 1/2 tablet (12.5 mg) with supper    nitroGLYCERIN (NITROSTAT) 0.4 MG SL tablet Place 1 tablet (0.4 mg total) under the tongue every 5 (five) minutes x 3 doses as needed for chest pain. Qty: 25 tablet, Refills: 2    OXYGEN Inhale 2-3 L into the lungs continuous.     Polyethyl Glycol-Propyl Glycol (SYSTANE) 0.4-0.3 % SOLN Place 1 drop into both eyes 2 (two) times daily.     !! pregabalin (LYRICA) 50 MG capsule Take 50 mg by mouth at bedtime.    !! pregabalin (LYRICA) 75 MG capsule Take 75 mg by mouth daily.      !! - Potential duplicate medications found. Please discuss with provider.       Aspirin prescribed at discharge?  Yes High Intensity Statin Prescribed? (Lipitor 40-'80mg'$  or Crestor 20-'40mg'$ ): Yes Beta Blocker Prescribed? Yes For EF 45% or less, Was ACEI/ARB Prescribed? No: EF normal ADP Receptor Inhibitor Prescribed? (i.e. Plavix etc.-Includes Medically Managed Patients): Yes For EF <40%, Aldosterone Inhibitor Prescribed?  Was EF assessed during THIS hospitalization? Yes LV gram Was Cardiac Rehab II ordered? (Included Medically managed Patients): Yes   Outstanding Labs/Studies     Duration of Discharge Encounter   Greater  than 30 minutes including physician time.  Signed, Icelyn Navarrete, [place] PAC 11/10/2015, 10:26 AM

## 2015-11-10 NOTE — Progress Notes (Signed)
CARDIAC REHAB PHASE I   PRE:  Rate/Rhythm: 90 SR  BP:  Sitting: 156/65        SaO2: 100 2L  MODE:  Ambulation: 250 ft   POST:  Rate/Rhythm: 122 ST  BP:  Sitting: 177/74         SaO2: 98 2L  Pt ambulated 250 ft on 2L O2, handheld assist, cane, fairly steady gait, tolerated fair.  Pt c/o of significant DOE (states this is the farthest she has walked since her knee surgery a year ago), denies cp, dizziness, standing rest x1. Pt HR elevated, improved after rest. Completed PCI/stent education with pt, pt's daughter and grandson at bedside.  Reviewed risk factors, tobacco cessation, anti-platelet therapy, stent card, activity restrictions, ntg, exercise, heart healthy diet, carb counting, portion control and phase 2 cardiac rehab. Pt verbalized understanding, pt eating donut for breakfast this morning, also states she loves to eat potatoes, encouraged dietary compliance. Pt agrees to phase 2 cardiac rehab referral, will send to [place]. Pt to edge of bed after walk, call bell within reach.  [id number] Lenna Sciara, RN, BSN 11/10/2015 9:43 AM

## 2015-11-10 NOTE — Progress Notes (Signed)
Subjective: She reports a little burning in her chest but no pressure like she was having before.   Objective: Vital signs in last 24 hours: Temp:  [97 F (36.1 C)-97.7 F (36.5 C)] 97.5 F (36.4 C) (02/04 0815) Pulse Rate:  [0-105] 86 (02/04 0815) Resp:  [4-32] 20 (02/04 0815) BP: (113-176)/(48-109) 156/65 mmHg (02/04 0815) SpO2:  [0 %-100 %] 96 % (02/04 0815) Weight:  [219 lb 5.7 oz (99.5 kg)-221 lb 12.8 oz (100.608 kg)] 219 lb 5.7 oz (99.5 kg) (02/04 0453) Last BM Date: 11/09/15  Intake/Output from previous day: 02/03 0701 - 02/04 0700 In: -  Out: 950 [Urine:950] Intake/Output this shift: Total I/O In: 340 [P.O.:340] Out: -   Medications Scheduled Meds: . aspirin  81 mg Oral Daily  . clopidogrel  75 mg Oral Q breakfast  . Fluticasone Furoate-Vilanterol  1 puff Inhalation Daily  . Influenza vac split quadrivalent PF  0.5 mL Intramuscular Tomorrow-1000  . insulin aspart  0-5 Units Subcutaneous QHS  . insulin aspart  0-9 Units Subcutaneous TID WC  . insulin detemir  40 Units Subcutaneous QHS  . insulin detemir  50 Units Subcutaneous Daily  . ipratropium-albuterol  3 mL Nebulization Q4H  . lisinopril  10 mg Oral BID  . metoprolol tartrate  12.5 mg Oral QHS  . metoprolol tartrate  25 mg Oral Daily  . nicotine  14 mg Transdermal Daily  . pantoprazole  40 mg Oral Daily  . polyvinyl alcohol  1 drop Both Eyes BID  . pravastatin  40 mg Oral q1800  . pregabalin  50 mg Oral QHS  . pregabalin  75 mg Oral BH-q7a  . sodium chloride flush  3 mL Intravenous Q12H   Continuous Infusions: . nitroGLYCERIN 15 mcg/min (11/09/15 2044)   PRN Meds:.sodium chloride, acetaminophen, ALPRAZolam, calcium carbonate, nitroGLYCERIN, ondansetron (ZOFRAN) IV, sodium chloride flush, zolpidem  PE: General appearance: alert, cooperative and no distress Lungs: Slight wheeze in the right base Heart: regular rate and rhythm, S1, S2 normal, no murmur, click, rub or gallop Extremities: No  LEE Pulses: 2+ and symmetric Skin: Warm and dry.  Right wrist cath site is stable.  No ecchymosis or hematoma Neurologic: Grossly normal  Lab Results:   Recent Labs  11/08/15 1706 11/09/15 1330 11/10/15 0125  WBC 5.9 5.9 5.2  HGB 11.7* 12.0 10.7*  HCT 38.7 38.5 35.3*  PLT 149* 155 136*   BMET  Recent Labs  11/08/15 1706 11/09/15 1330 11/10/15 0125  NA 138 140 138  K 4.4 4.5 3.9  CL 103 103 105  CO2 24 27 21*  GLUCOSE 228* 206* 195*  BUN '20 12 10  '$ CREATININE 0.79 0.82 0.81  CALCIUM 9.2 9.4 8.8*   PT/INR  Recent Labs  11/08/15 1706 11/09/15 1330  LABPROT 13.0 13.3  INR 0.96 0.99   Cholesterol  Recent Labs  11/10/15 0125  CHOL 126   Lipid Panel     Component Value Date/Time   CHOL 126 11/10/2015 0125   TRIG 289* 11/10/2015 0125   HDL 32* 11/10/2015 0125   CHOLHDL 3.9 11/10/2015 0125   VLDL 58* 11/10/2015 0125   LDLCALC 36 11/10/2015 0125    Post-Intervention Diagram          Assessment/Plan   Active Problems:   Type II diabetes mellitus ([place])   Hyperlipidemia LDL goal <70   TOBACCO ABUSE   Unstable angina (HCC)   Coronary artery disease   Hypertension  Status post left heart cath revealing  severe in-stent restenosis in the right coronary artery.  The entire proximal to distal right coronary was restented.  She also had angiosculpt scoring balloon PCI on the left ventricular branch of the right coronary reducing the stenosis from 85% to 25% with TIMI grade 3 flow.  Dual anti-plate with aspirin and Plavix. She is also on pravastatin 40 mg, metoprolol tartrate 12.5 mg daily at bedtime, lisinopril 10 mg twice daily.  Post cath showed creatinine was within normal limits.  BP 113/48-176/89. Give AM meds before DC.  Tobacco cessation discussed a long with low carb and Na diet.  Follow-up in office for medication titration.  Ambulated this morning with Cardiac Rehab and DC home with two FU.       LOS: 1 day    Labib Cwynar PA-C 11/10/2015 8:36  AM

## 2015-11-12 ENCOUNTER — Encounter (HOSPITAL_COMMUNITY): Payer: Self-pay | Admitting: Interventional Cardiology

## 2015-11-12 DIAGNOSIS — H524 Presbyopia: Secondary | ICD-10-CM | POA: Diagnosis not present

## 2015-11-19 NOTE — Progress Notes (Signed)
Cardiology Office Note   Date:  11/20/2015   ID:  [name], DOB 1946-06-30, MRN [id number]  PCP:  [name], MD  Cardiologist:  Dr. [place] hospital follow up.   History of Present Illness:  [name] is a 70 y.o. female with a history of CAD s/p PCI to RCA (2005); DES to LCx and OM2 (08/2014), tobacco abuse, HTN, DMT2, GERD, HLD, COPD on home 02, R lung adenocarcinoma who presents for post hospital follow up.   She was directly admitted from the cardiology office on 11/09/15 for [place]. She underwent LHC on 11/09/15 revealing severe in-stent restenosis in the right coronary artery.The entire proximal to distal right coronary was restented. She also had angiosculpt scoring balloon PCI on the left ventricular branch of the right coronary reducing the stenosis from 85% to 25% with TIMI grade 3 flow. DAPT with aspirin and Plavix was recommended for at least a year. She was also continued on on pravastatin 40 mg, metoprolol tartrate 12.5 mg daily at bedtime, lisinopril 10 mg twice daily.Of note, she complained of black stools and she had a rectal exam which was heme negative.Tobacco cessation discussed along with low carb and Na diet.  Today she presents to clinic for follow up. She is doing pretty well. She had had some transient, intermittent left sided chest burning since discharge, but nothing she has had to take a NTG for. She also had a couple minutes of palpitations as well, but this self resolved rather quickly and has not recurred. Her breathing has been okay. She is still smoking about a half pack a day. The patch had too much nicotine in it and bothered her chest. She has chronic LE edema and well as chronic orthopnea. She occasionally has PND. No dizziness or syncope. No blood in her stool or urine. She has not been able to exercise due to recent leg surgery but is now able to bear weight on the leg and will start walking.   Past Medical History  Diagnosis Date  .  Asthma   . Cholelithiasis   . Colon polyps   . Type II diabetes mellitus ([place])   . GERD (gastroesophageal reflux disease)   . Hyperlipidemia   . Tobacco abuse   . COPD (chronic obstructive pulmonary disease) (HCC)     USES O2 AT NIGHT + PRN IN DAYTIME3L  . Emphysema   . H/O hiatal hernia   . Hypertension   . Fibromyalgia   . Lung cancer ([place]) 2010    Adenocarcinoma, right lung, node positive  . On home oxygen therapy     "2-4L at night and prn" (07/18/2104)  . Chronic bronchitis ([place])   . FH: chemotherapy 2010     had 4 times  . Radiation 6/10    28 times right lung, and 5 treatments to sternum  . Oxygen dependent     at night  3liters  . Coronary artery disease     a. 2005 Cath/PCI: mRCA-> [place] DES;  b. 06/2014 Cath/PCI: EF 60%. LM nl, LAD min irregs, D1 40, LCX 20p, OM2 95 (2.5x14 Resolute DES), RCA 30p, 30 ISR, 20d, PDA/PLA nl.  . Peripheral vascular disease ([place])   . Pneumonia     hx of   . Stress incontinence     Past Surgical History  Procedure Laterality Date  . Appendectomy  1970  . Gallbladder surgery  1989  . Abdominal hysterectomy  1972  . Tonsillectomy  1966  .  Carpal tunnel release    . Replacement total knee  2001    left  . Neck surgery  2005  . Pneumonectomy      Right Partial, lung cancer confirmed, nod positive  . Right upper lobectomy with lymph node dissection  10/12/2008    Dr Arlyce Dice  . Coronary angioplasty  2005  . Portacath placement  12/12/2007  . Port-a-cath removal  05/12/2012    Procedure: REMOVAL PORT-A-CATH;  Surgeon: Melrose Nakayama, MD;  Location: [facility];  Service: Thoracic;  Laterality: Left;  [name] Eye surgery      implants 02/18/12 (Left) 02/25/12 (right eye)  . Rotator cuff repair      RIGHT SHOULDER  2 YRS  . Orif humerus fracture Left 12/14/2012    Procedure: OPEN REDUCTION INTERNAL FIXATION (ORIF) LEFT PROXIMAL HUMERUS FRACTURE;  Surgeon: Mcarthur Rossetti, MD;  Location: [facility];  Service: Orthopedics;  Laterality: Left;  .  Abdominal hysterectomy    . Joint replacement    . Femur im nail Left 07/19/2014    Procedure: INTRAMEDULLARY (IM) NAIL FEMORAL;  Surgeon: Alta Corning, MD;  Location: [facility];  Service: Orthopedics;  Laterality: Left;  . Left heart catheterization with coronary angiogram N/A 06/20/2014    Procedure: LEFT HEART CATHETERIZATION WITH CORONARY ANGIOGRAM;  Surgeon: Troy Sine, MD;  Location: [facility];  Service: Cardiovascular;  Laterality: N/A;  . Back surgery  2007  . Arm fracture Left 3/14  . Femur fracture surgery Left     10/15  . Refractive surgery Bilateral 2/14  . Total knee revision Left 07/19/2015    Procedure: Removal Intramedullary Rod and Screws Left Knee/Femur, Revision arthroplasty Left knee;  Surgeon: Mcarthur Rossetti, MD;  Location: [facility];  Service: Orthopedics;  Laterality: Left;  . Hardware removal Left 07/19/2015    Procedure: HARDWARE REMOVAL;  Surgeon: Mcarthur Rossetti, MD;  Location: [facility];  Service: Orthopedics;  Laterality: Left;  . Cardiac catheterization N/A 11/09/2015    Procedure: Left Heart Cath and Coronary Angiography;  Surgeon: Belva Crome, MD;  Location: [facility];  Service: Cardiovascular;  Laterality: N/A;  . Cardiac catheterization Right 11/09/2015    Procedure: Intravascular Pressure Wire/FFR Study;  Surgeon: Belva Crome, MD;  Location: [facility];  Service: Cardiovascular;  Laterality: Right;  . Cardiac catheterization N/A 11/09/2015    Procedure: Coronary Stent Intervention;  Surgeon: Belva Crome, MD;  Location: [facility];  Service: Cardiovascular;  Laterality: N/A;  RCA     Current Outpatient Prescriptions  Medication Sig Dispense Refill  . acetaminophen (TYLENOL) 500 MG tablet Take 1,000 mg by mouth every 6 (six) hours as needed for mild pain.     [name] albuterol (PROVENTIL) (2.5 MG/3ML) 0.083% nebulizer solution Take 2.5 mg by nebulization every 6 (six) hours as needed for wheezing or shortness of breath.    [name]  aspirin EC 81 MG tablet Take 81 mg by mouth 2 (two) times daily.    . calcium carbonate (TUMS - DOSED IN MG ELEMENTAL CALCIUM) 500 MG chewable tablet Chew 2-3 tablets by mouth daily as needed for indigestion or heartburn.     . Cholecalciferol (VITAMIN D) 1000 UNITS capsule Take 1,000 Units by mouth daily.     . clopidogrel (PLAVIX) 75 MG tablet Take 1 tablet (75 mg total) by mouth daily with breakfast. 30 tablet 11  . esomeprazole (NEXIUM) 40 MG capsule Take 40 mg by mouth as needed (for  heartburn/ acid reflux).     . Fluticasone Furoate-Vilanterol (BREO ELLIPTA) 100-25 MCG/INH AEPB Inhale 1 puff into the lungs daily.    . furosemide (LASIX) 20 MG tablet Take 20 mg by mouth daily as needed (feet, ankle and wrist swelling).     [name] HYDROcodone-acetaminophen (NORCO/VICODIN) 5-325 MG tablet Take 1-2 tablets by mouth every 4 (four) hours as needed for moderate pain.    [name] insulin aspart (NOVOLOG) 100 UNIT/ML injection Inject 15-25 Units into the skin 3 (three) times daily before meals. Per sliding scale: CBG <200 15 units, 200-260 20 units, >260 25 units    . insulin detemir (LEVEMIR) 100 UNIT/ML injection Inject 40-50 Units into the skin 2 (two) times daily. Inject 50 units subcutaneously every morning and 40 units at bedtime    . Ipratropium-Albuterol (COMBIVENT RESPIMAT) 20-100 MCG/ACT AERS respimat Inhale 1 puff into the lungs 2 (two) times daily as needed for wheezing.    [name] lisinopril (PRINIVIL,ZESTRIL) 10 MG tablet Take 1 tablet (10 mg total) by mouth 2 (two) times daily. Please schedule appointment for refills. 60 tablet 0  . lovastatin (MEVACOR) 40 MG tablet Take 40 mg by mouth daily after supper.    . metFORMIN (GLUCOPHAGE) 500 MG tablet Take 1 tablet (500 mg total) by mouth 2 (two) times daily with a meal.    . metoprolol tartrate (LOPRESSOR) 25 MG tablet Take 12.5-25 mg by mouth 2 (two) times daily. Take 1 tablet (25 mg) by mouth every morning and 1/2 tablet (12.5 mg) with supper    .  nitroGLYCERIN (NITROSTAT) 0.4 MG SL tablet Place 1 tablet (0.4 mg total) under the tongue every 5 (five) minutes x 3 doses as needed for chest pain. 25 tablet 2  . OXYGEN Inhale 2-3 L into the lungs continuous.     Vladimir Faster Glycol-Propyl Glycol (SYSTANE) 0.4-0.3 % SOLN Place 1 drop into both eyes 2 (two) times daily.     . pregabalin (LYRICA) 50 MG capsule Take 50 mg by mouth at bedtime.    . pregabalin (LYRICA) 75 MG capsule Take 75 mg by mouth daily.      No current facility-administered medications for this visit.    Allergies:   Percocet; Celebrex; Niacin; and Varenicline tartrate    Social History:  The patient  reports that she has been smoking Cigarettes.  She has a 53 pack-year smoking history. She has never used smokeless tobacco. She reports that she does not drink alcohol or use illicit drugs.   Family History:  The patient's family history includes CAD (age of onset: 70) in her father; Diabetes in her father and mother; Heart failure in her mother.    ROS:  Please see the history of present illness.   Otherwise, review of systems are positive for NONE.   All other systems are reviewed and negative.    PHYSICAL EXAM: VS:  BP 130/74 mmHg  Pulse 74  Ht 5' 1.5" (1.562 m)  Wt 223 lb 9.6 oz (101.424 kg)  BMI 41.57 kg/m2 , BMI Body mass index is 41.57 kg/(m^2). GEN: Well nourished, well developed, in no acute distressobese HEENT: normal Neck: no JVD, carotid bruits, or masses Cardiac: RRR; no murmurs, rubs, or gallops. Trace bilateralLE edema  Respiratory:  clear to auscultation bilaterally, normal work of breathing. Mild wheezes in L/RUQ GI: soft, nontender, nondistended, + BS MS: no deformity or atrophy Skin: warm and dry, no rash Neuro:  Strength and sensation are intact Psych: euthymic mood, full affect  EKG:  EKG is  ordered today. NSR HR 79 LAD.    Recent Labs: 11/08/2015: ALT 12* 11/09/2015: B Natriuretic Peptide 18.2; Magnesium 1.8; TSH 0.563 11/10/2015: BUN  10; Creatinine, Ser 0.81; Hemoglobin 10.7*; Platelets 136*; Potassium 3.9; Sodium 138    Lipid Panel    Component Value Date/Time   CHOL 126 11/10/2015 0125   TRIG 289* 11/10/2015 0125   HDL 32* 11/10/2015 0125   CHOLHDL 3.9 11/10/2015 0125   VLDL 58* 11/10/2015 0125   LDLCALC 36 11/10/2015 0125      Wt Readings from Last 3 Encounters:  11/20/15 223 lb 9.6 oz (101.424 kg)  11/10/15 219 lb 5.7 oz (99.5 kg)  11/09/15 221 lb 12.8 oz (100.608 kg)      Other studies Reviewed: Additional studies/ records that were reviewed today include: LHC Review of the above records demonstrates:    11/09/15 Procedures    Coronary Stent Intervention   Intravascular Pressure Wire/FFR Study   Left Heart Cath and Coronary Angiography    Conclusion     Moderate to severe coronary artery disease with 50% stenosis in the mid LAD, 40% first diagonal, 50% stenosis in the proximal- mid circumflex, widely patent stent in the mid circumflex, moderate re-stenosis within the mid segment of the right coronary, and new 85% eccentric stenosis in large left ventricular branch of the right coronary.   We felt that the right coronary was the possible culprit for presentation and performed FFR with pullback across the distal lesion within the left ventricular branch of the right coronary. The FFR across the left ventricular branch was 0.64. Pullback across the lesion revealed FFR 0.76 (suggesting significant obstruction across the stented mid right coronary).   Successful Angiosculpt scoring balloon PCI on the left ventricular branch of the right coronary reducing the stenosis from 85% to 25% with TIMI grade 3 flow.   Successful angioplasty and restenting of the entire proximal to distal right coronary region of restenosis , reducing obstruction from 80% to 10% with TIMI grade 3 flow.   Normal left ventricular function, LVEDP 14 mmHg, and EF greater than 50%.  RECOMMENDATIONS:   IV hydration    Dual antiplatelet therapy 12 months   Potential discharge in a.m. if no renal impairment or other concerns arise.          ASSESSMENT AND PLAN:  [name] is a 70 y.o. female with a history of CAD s/p PCI to RCA (2005); DES to LCx and OM2 (08/2014), tobacco abuse, HTN, DMT2, GERD, HLD, COPD on home 02, R lung adenocarcinoma who presents for post hospital follow up.   CAD: She underwent LHC on 11/09/15 revealing severe in-stent restenosis in the right coronary artery.The entire proximal to distal right coronary was restented. She also had angiosculpt scoring balloon PCI on the left ventricular branch of the right coronary reducing the stenosis from 85% to 25% with TIMI grade 3 flow.  -- Continue DAPT with aspirin and Plavix for at least a year. Continue pravastatin 40 mg, metoprolol tartrate 12.5 mg daily at bedtime and lisinopril 10 mg BID.   Tobacco abuse:  cessation discussed. Still smoking ~ 1/2 PPD. She has some '14mg'$  nicotine patches at home. I have advised her to try the lower doses of nicotine to see if this helps  COPD: on home 02.   HLD: continue statin . Last LDL excellent at 36  DMT2: HgA1c 8.1. She needs better control of this. Follow up with PCP  HTN: BP well  controlled on current regimen  Current medicines are reviewed at length with the patient today.  The patient does not have concerns regarding medicines.  The following changes have been made:  no change  Labs/ tests ordered today include:   Orders Placed This Encounter  Procedures  . EKG 12-Lead    Disposition:   FU with Dr. Percival Spanish in 6-8 weeks.   Renea Ee  11/20/2015 10:36 AM    [facility] [address], [place], [place]  [address] Phone: [phone]; Fax: [phone]

## 2015-11-20 ENCOUNTER — Encounter: Payer: Self-pay | Admitting: Physician Assistant

## 2015-11-20 ENCOUNTER — Ambulatory Visit (INDEPENDENT_AMBULATORY_CARE_PROVIDER_SITE_OTHER): Payer: Medicare Other | Admitting: Physician Assistant

## 2015-11-20 VITALS — BP 130/74 | HR 74 | Ht 61.5 in | Wt 223.6 lb

## 2015-11-20 DIAGNOSIS — I200 Unstable angina: Secondary | ICD-10-CM

## 2015-11-20 DIAGNOSIS — I10 Essential (primary) hypertension: Secondary | ICD-10-CM | POA: Diagnosis not present

## 2015-11-20 DIAGNOSIS — I2510 Atherosclerotic heart disease of native coronary artery without angina pectoris: Secondary | ICD-10-CM | POA: Diagnosis not present

## 2015-11-20 NOTE — Patient Instructions (Signed)
Medication Instructions:  Your physician recommends that you continue on your current medications as directed. Please refer to the Current Medication list given to you today.   Labwork: None ordered  Testing/Procedures: None ordered  Follow-Up: Your physician recommends that you schedule a follow-up appointment in:  [place] DR. HOCHRE[place]   Any Other Special Instructions Will Be Listed Below (If Applicable).     If you need a refill on your cardiac medications before your next appointment, please call your pharmacy.

## 2015-11-23 DIAGNOSIS — J449 Chronic obstructive pulmonary disease, unspecified: Secondary | ICD-10-CM | POA: Diagnosis not present

## 2015-12-03 DIAGNOSIS — J449 Chronic obstructive pulmonary disease, unspecified: Secondary | ICD-10-CM | POA: Diagnosis not present

## 2015-12-03 DIAGNOSIS — E869 Volume depletion, unspecified: Secondary | ICD-10-CM | POA: Diagnosis not present

## 2015-12-04 ENCOUNTER — Other Ambulatory Visit: Payer: Self-pay | Admitting: Cardiology

## 2015-12-04 NOTE — Telephone Encounter (Signed)
REFILL 

## 2015-12-21 DIAGNOSIS — J449 Chronic obstructive pulmonary disease, unspecified: Secondary | ICD-10-CM | POA: Diagnosis not present

## 2015-12-31 DIAGNOSIS — E869 Volume depletion, unspecified: Secondary | ICD-10-CM | POA: Diagnosis not present

## 2015-12-31 DIAGNOSIS — J449 Chronic obstructive pulmonary disease, unspecified: Secondary | ICD-10-CM | POA: Diagnosis not present

## 2016-01-06 NOTE — Progress Notes (Signed)
HPI The patient presents for followup of her known coronary disease. Last September she was admitted with unstable angina. At that time she had a DES stent to the  circumflex acute marginal and OM 2 branch.  After that she fell and had a femur fracture which had to be repaired. This has apparently had some difficulty healing and she's going to have some other surgery on her hip soon. She also has back problems and her physician wants to put in a nerve stimulator. She's not had any cardiac complaints. She did briefly stop her Plavix because of some facial swelling. We talked to her provider and try to put her on Effient but she couldn't afford this. She then went back to her Plavix taking it at night and she is doing okay with it. She denies any cardiovascular symptoms. She's not having any chest pressure, neck or arm discomfort. She's had no weight gain or edema.  She unfortunately is still smoking.    Allergies  Allergen Reactions  . Percocet [Oxycodone-Acetaminophen] Shortness Of Breath  . Celebrex [Celecoxib] Palpitations and Other (See Comments)    Chest pain, tachycardia, diaphoresis  . Niacin Rash  . Varenicline Tartrate Rash    Reaction to [facility]    Current Outpatient Prescriptions  Medication Sig Dispense Refill  . acetaminophen (TYLENOL) 500 MG tablet Take 1,000 mg by mouth every 6 (six) hours as needed for mild pain.     [name] albuterol (PROVENTIL) (2.5 MG/3ML) 0.083% nebulizer solution Take 2.5 mg by nebulization every 6 (six) hours as needed for wheezing or shortness of breath.    [name] aspirin EC 81 MG tablet Take 81 mg by mouth 2 (two) times daily.    . calcium carbonate (TUMS - DOSED IN MG ELEMENTAL CALCIUM) 500 MG chewable tablet Chew 2-3 tablets by mouth daily as needed for indigestion or heartburn.     . Cholecalciferol (VITAMIN D) 1000 UNITS capsule Take 1,000 Units by mouth daily.     . clopidogrel (PLAVIX) 75 MG tablet Take 1 tablet (75 mg total) by mouth daily with breakfast.  30 tablet 11  . esomeprazole (NEXIUM) 40 MG capsule Take 40 mg by mouth as needed (for heartburn/ acid reflux).     . furosemide (LASIX) 20 MG tablet Take 20 mg by mouth daily as needed (feet, ankle and wrist swelling).     . insulin aspart (NOVOLOG) 100 UNIT/ML injection Inject 15-25 Units into the skin 3 (three) times daily before meals. Per sliding scale: CBG <200 15 units, 200-260 20 units, >260 25 units    . insulin detemir (LEVEMIR) 100 UNIT/ML injection Inject 40-50 Units into the skin 2 (two) times daily. Inject 50 units subcutaneously every morning and 40 units at bedtime    . lisinopril (PRINIVIL,ZESTRIL) 10 MG tablet TAKE ONE TABLET BY MOUTH TWICE DAILY **PATIENT  NEEDS  APPOINTMENT  FOR  FURTHER  REFILLS** 60 tablet 1  . lovastatin (MEVACOR) 40 MG tablet Take 40 mg by mouth daily after supper.    . metFORMIN (GLUCOPHAGE) 500 MG tablet Take 1 tablet (500 mg total) by mouth 2 (two) times daily with a meal.    . metoprolol tartrate (LOPRESSOR) 25 MG tablet Take 12.5-25 mg by mouth 2 (two) times daily. Take 1 tablet (25 mg) by mouth every morning and 1/2 tablet (12.5 mg) with supper    . nitroGLYCERIN (NITROSTAT) 0.4 MG SL tablet Place 1 tablet (0.4 mg total) under the tongue every 5 (five) minutes x 3 doses  as needed for chest pain. 25 tablet 2  . OXYGEN Inhale 2-3 L into the lungs continuous.     Vladimir Faster Glycol-Propyl Glycol (SYSTANE) 0.4-0.3 % SOLN Place 1 drop into both eyes 2 (two) times daily.     . pregabalin (LYRICA) 50 MG capsule Take 50 mg by mouth at bedtime.    . pregabalin (LYRICA) 75 MG capsule Take 75 mg by mouth daily.      No current facility-administered medications for this visit.    Past Medical History  Diagnosis Date  . Asthma   . Cholelithiasis   . Colon polyps   . Type II diabetes mellitus ([place])   . GERD (gastroesophageal reflux disease)   . Hyperlipidemia   . Tobacco abuse   . COPD (chronic obstructive pulmonary disease) (H[facility])     USES O2 AT NIGHT +  PRN IN DAYTIME3L  . Emphysema   . H/O hiatal hernia   . Hypertension   . Fibromyalgia   . Lung cancer ([place]) 2010    Adenocarcinoma, right lung, node positive  . On home oxygen therapy     "2-4L at night and prn" (07/18/2104)  . Chronic bronchitis ([place])   . FH: chemotherapy 2010     had 4 times  . Radiation 6/10    28 times right lung, and 5 treatments to sternum  . Oxygen dependent     at night  3liters  . Coronary artery disease     a. 2005 Cath/PCI: mRCA-> Cypher DES;  b. 06/2014 Cath/PCI: EF 60%. LM nl, LAD min irregs, D1 40, LCX 20p, OM2 95 (2.5x14 Resolute DES), RCA 30p, 30 ISR, 20d, PDA/PLA nl..  11/2015  Stenting of the entire RCA and LV branch off the RCA.    [name] Peripheral vascular disease ([place])   . Pneumonia     hx of   . Stress incontinence     Past Surgical History  Procedure Laterality Date  . Appendectomy  1970  . Gallbladder surgery  1989  . Abdominal hysterectomy  1972  . Tonsillectomy  1966  . Carpal tunnel release    . Replacement total knee  2001    left  . Neck surgery  2005  . Pneumonectomy      Right Partial, lung cancer confirmed, nod positive  . Right upper lobectomy with lymph node dissection  10/12/2008    Dr Arlyce Dice  . Coronary angioplasty  2005  . Portacath placement  12/12/2007  . Port-a-cath removal  05/12/2012    Procedure: REMOVAL PORT-A-CATH;  Surgeon: Melrose Nakayama, MD;  Location: [place];  Service: Thoracic;  Laterality: Left;  [name] Eye surgery      implants 02/18/12 (Left) 02/25/12 (right eye)  . Rotator cuff repair      RIGHT SHOULDER  2 YRS  . Orif humerus fracture Left 12/14/2012    Procedure: OPEN REDUCTION INTERNAL FIXATION (ORIF) LEFT PROXIMAL HUMERUS FRACTURE;  Surgeon: Mcarthur Rossetti, MD;  Location: [place];  Service: Orthopedics;  Laterality: Left;  . Abdominal hysterectomy    . Joint replacement    . Femur im nail Left 07/19/2014    Procedure: INTRAMEDULLARY (IM) NAIL FEMORAL;  Surgeon: Alta Corning, MD;  Location: [place];   Service: Orthopedics;  Laterality: Left;  . Left heart catheterization with coronary angiogram N/A 06/20/2014    Procedure: LEFT HEART CATHETERIZATION WITH CORONARY ANGIOGRAM;  Surgeon: Troy Sine, MD;  Location: [facility] CATH LAB;  Service: Cardiovascular;  Laterality: N/A;  .  Back surgery  2007  . Arm fracture Left 3/14  . Femur fracture surgery Left     10/15  . Refractive surgery Bilateral 2/14  . Total knee revision Left 07/19/2015    Procedure: Removal Intramedullary Rod and Screws Left Knee/Femur, Revision arthroplasty Left knee;  Surgeon: Mcarthur Rossetti, MD;  Location: [facility];  Service: Orthopedics;  Laterality: Left;  . Hardware removal Left 07/19/2015    Procedure: HARDWARE REMOVAL;  Surgeon: Mcarthur Rossetti, MD;  [facility];  Service: Orthopedics;  Laterality: Left;  . Cardiac catheterization N/A 11/09/2015    Procedure: Left Heart Cath and Coronary Angiography;  Surgeon: Belva Crome, MD;  Location: [place] CV LAB;  Service: Cardiovascular;  Laterality: N/A;  . Cardiac catheterization Right 11/09/2015    Procedure: Intravascular Pressure Wire/FFR Study;  Surgeon: Belva Crome, MD;  Location: [place] CV LAB;  Service: Cardiovascular;  Laterality: Right;  . Cardiac catheterization N/A 11/09/2015    Procedure: Coronary Stent Intervention;  Surgeon: Belva Crome, MD;  Location: [place] CV LAB;  Service: Cardiovascular;  Laterality: N/A;  RCA    ROS:  As stated in the HPI and negative for all other systems.  PHYSICAL EXAM BP 128/77 mmHg  Pulse 84  Ht '5\' 1"'$  (1.549 m)  Wt 223 lb 12.8 oz (101.515 kg)  BMI 42.31 kg/m2 GENERAL:  Well appearing HEENT:  Pupils equal round and reactive, fundi not visualized, oral mucosa unremarkable NECK:  No jugular venous distention, waveform within normal limits, carotid upstroke brisk and symmetric, no bruits, no thyromegaly LYMPHATICS:  No cervical, inguinal adenopathy LUNGS:  Decreased breath sounds BACK:  No CVA  tenderness CHEST:  Unremarkable HEART:  PMI not displaced or sustained,S1 and S2 within normal limits, no S3, no S4, no clicks, no rubs, no murmurs ABD:  Flat, positive bowel sounds normal in frequency in pitch, no bruits, no rebound, no guarding, no midline pulsatile mass, no hepatomegaly, no splenomegaly EXT:  2 plus pulses throughout, trace edema, no cyanosis no clubbing SKIN:  No rashes no nodules NEURO:  Cranial nerves II through XII grossly intact, motor grossly intact throughout PSYCH:  Cognitively intact, oriented to person place and time    ASSESSMENT AND PLAN  CAD:  I have reviewed the hospital records. She will remain on the meds as listed although I will reduce the aspirin 81 mg daily.  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  DM:    I asked her to follow up with [name], MD.  Her A1c is 8.1  Lab Results  Component Value Date   HGBA1C 8.1* 11/09/2015    DYSLIPIDEMIA:   She will remain on the meds as listed.  Lab Results  Component Value Date   CHOL 126 11/10/2015   TRIG 289* 11/10/2015   HDL 32* 11/10/2015   LDLCALC 36 11/10/2015     Hospital records reviewed

## 2016-01-07 ENCOUNTER — Encounter: Payer: Self-pay | Admitting: Cardiology

## 2016-01-07 ENCOUNTER — Ambulatory Visit (INDEPENDENT_AMBULATORY_CARE_PROVIDER_SITE_OTHER): Payer: Medicare Other | Admitting: Cardiology

## 2016-01-07 VITALS — BP 128/77 | HR 84 | Ht 61.0 in | Wt 223.8 lb

## 2016-01-07 DIAGNOSIS — I25110 Atherosclerotic heart disease of native coronary artery with unstable angina pectoris: Secondary | ICD-10-CM | POA: Diagnosis not present

## 2016-01-07 NOTE — Patient Instructions (Signed)
Your physician recommends that you schedule a follow-up appointment in: 3 Months  

## 2016-01-10 DIAGNOSIS — I10 Essential (primary) hypertension: Secondary | ICD-10-CM | POA: Diagnosis not present

## 2016-01-10 DIAGNOSIS — E1165 Type 2 diabetes mellitus with hyperglycemia: Secondary | ICD-10-CM | POA: Diagnosis not present

## 2016-01-16 DIAGNOSIS — I10 Essential (primary) hypertension: Secondary | ICD-10-CM | POA: Diagnosis not present

## 2016-01-16 DIAGNOSIS — I2510 Atherosclerotic heart disease of native coronary artery without angina pectoris: Secondary | ICD-10-CM | POA: Diagnosis not present

## 2016-01-16 DIAGNOSIS — J449 Chronic obstructive pulmonary disease, unspecified: Secondary | ICD-10-CM | POA: Diagnosis not present

## 2016-01-16 DIAGNOSIS — E1165 Type 2 diabetes mellitus with hyperglycemia: Secondary | ICD-10-CM | POA: Diagnosis not present

## 2016-01-21 DIAGNOSIS — J449 Chronic obstructive pulmonary disease, unspecified: Secondary | ICD-10-CM | POA: Diagnosis not present

## 2016-01-31 DIAGNOSIS — J449 Chronic obstructive pulmonary disease, unspecified: Secondary | ICD-10-CM | POA: Diagnosis not present

## 2016-01-31 DIAGNOSIS — E869 Volume depletion, unspecified: Secondary | ICD-10-CM | POA: Diagnosis not present

## 2016-02-07 ENCOUNTER — Other Ambulatory Visit: Payer: Self-pay | Admitting: Cardiology

## 2016-02-08 NOTE — Telephone Encounter (Signed)
Rx Refill

## 2016-02-20 DIAGNOSIS — J449 Chronic obstructive pulmonary disease, unspecified: Secondary | ICD-10-CM | POA: Diagnosis not present

## 2016-03-01 DIAGNOSIS — E869 Volume depletion, unspecified: Secondary | ICD-10-CM | POA: Diagnosis not present

## 2016-03-01 DIAGNOSIS — J449 Chronic obstructive pulmonary disease, unspecified: Secondary | ICD-10-CM | POA: Diagnosis not present

## 2016-03-11 ENCOUNTER — Other Ambulatory Visit: Payer: Self-pay | Admitting: Cardiology

## 2016-03-11 NOTE — Telephone Encounter (Signed)
Rx(s) sent to pharmacy electronically.  

## 2016-03-22 DIAGNOSIS — J449 Chronic obstructive pulmonary disease, unspecified: Secondary | ICD-10-CM | POA: Diagnosis not present

## 2016-04-01 DIAGNOSIS — E869 Volume depletion, unspecified: Secondary | ICD-10-CM | POA: Diagnosis not present

## 2016-04-01 DIAGNOSIS — J449 Chronic obstructive pulmonary disease, unspecified: Secondary | ICD-10-CM | POA: Diagnosis not present

## 2016-04-21 DIAGNOSIS — J449 Chronic obstructive pulmonary disease, unspecified: Secondary | ICD-10-CM | POA: Diagnosis not present

## 2016-04-27 NOTE — Progress Notes (Signed)
HPI The patient presents for followup of her known coronary disease. Last September she was admitted with unstable angina. At that time she had a DES stent to the  circumflex acute marginal and OM 2 branch.  After that she fell and had a femur fracture which had to be repaired.   I saw her in April and she was doing well from a cardiac standpoint.  She was thinking of having another hip procedure and a TENS placed but she's decided against this. She's had no acute cardiac complaints. Her sugars are better controlled since she started Jardiance.  She's also lost some weight. Her breathing is a little bit better. She's had no new chest pressure, neck or arm discomfort. She's had no palpitations, presyncope or syncope.  Allergies  Allergen Reactions  . Percocet [Oxycodone-Acetaminophen] Shortness Of Breath  . Celebrex [Celecoxib] Palpitations and Other (See Comments)    Chest pain, tachycardia, diaphoresis  . Niacin Rash  . Varenicline Tartrate Rash    Reaction to [facility]    Current Outpatient Prescriptions  Medication Sig Dispense Refill  . acetaminophen (TYLENOL) 500 MG tablet Take 1,000 mg by mouth every 6 (six) hours as needed for mild pain.     [name] albuterol (PROVENTIL) (2.5 MG/3ML) 0.083% nebulizer solution Take 2.5 mg by nebulization every 6 (six) hours as needed for wheezing or shortness of breath.    [name] albuterol-ipratropium (COMBIVENT) 18-103 MCG/ACT inhaler Inhale 1-2 puffs into the lungs every 4 (four) hours.    [name] aspirin EC 81 MG tablet Take 81 mg by mouth 2 (two) times daily.    . calcium carbonate (TUMS - DOSED IN MG ELEMENTAL CALCIUM) 500 MG chewable tablet Chew 2-3 tablets by mouth daily as needed for indigestion or heartburn.     . Cholecalciferol (VITAMIN D) 1000 UNITS capsule Take 1,000 Units by mouth daily.     . clopidogrel (PLAVIX) 75 MG tablet Take 1 tablet (75 mg total) by mouth daily with breakfast. 30 tablet 11  . empagliflozin (JARDIANCE) 25 MG TABS tablet Take 25 mg  by mouth daily.    [name] esomeprazole (NEXIUM) 40 MG capsule Take 40 mg by mouth as needed (for heartburn/ acid reflux).     . furosemide (LASIX) 20 MG tablet Take 20 mg by mouth daily as needed (feet, ankle and wrist swelling).     . insulin aspart (NOVOLOG) 100 UNIT/ML injection Inject 1,015 Units into the skin as needed. Per sliding scale: CBG <200 15 units, 200-260 20 units, >260 25 units    . insulin detemir (LEVEMIR) 100 UNIT/ML injection Inject 40-50 Units into the skin 2 (two) times daily. Inject 50 units subcutaneously every morning and 45 units at bedtime    . lisinopril (PRINIVIL,ZESTRIL) 10 MG tablet Take 1 tablet (10 mg total) by mouth daily. 60 tablet 6  . lovastatin (MEVACOR) 40 MG tablet Take 40 mg by mouth daily after supper.    . metFORMIN (GLUCOPHAGE) 500 MG tablet Take 1 tablet (500 mg total) by mouth 2 (two) times daily with a meal.    . metoprolol tartrate (LOPRESSOR) 25 MG tablet Take 12.5-25 mg by mouth 2 (two) times daily. Take 1 tablet (25 mg) by mouth every morning and 1/2 tablet (12.5 mg) with supper    . nitroGLYCERIN (NITROSTAT) 0.4 MG SL tablet Place 1 tablet (0.4 mg total) under the tongue every 5 (five) minutes x 3 doses as needed for chest pain. 25 tablet 2  . OXYGEN Inhale 2-3 L into  the lungs continuous.     Vladimir Faster Glycol-Propyl Glycol (SYSTANE) 0.4-0.3 % SOLN Place 1 drop into both eyes 2 (two) times daily.     . pregabalin (LYRICA) 50 MG capsule Take 50 mg by mouth 2 (two) times daily.      No current facility-administered medications for this visit.     Past Medical History:  Diagnosis Date  . Asthma   . Cholelithiasis   . Chronic bronchitis ([place])   . Colon polyps   . COPD (chronic obstructive pulmonary disease) (HCC)    USES O2 AT NIGHT + PRN IN DAYTIME3L  . Coronary artery disease    a. 2005 Cath/PCI: mRCA-> Cypher DES;  b. 06/2014 Cath/PCI: EF 60%. LM nl, LAD min irregs, D1 40, LCX 20p, OM2 95 (2.5x14 Resolute DES), RCA 30p, 30 ISR, 20d, PDA/PLA  nl..  11/2015  Stenting of the entire RCA and LV branch off the RCA.    . Emphysema   . FH: chemotherapy 2010    had 4 times  . Fibromyalgia   . GERD (gastroesophageal reflux disease)   . H/O hiatal hernia   . Hyperlipidemia   . Hypertension   . Lung cancer ([place]) 2010   Adenocarcinoma, right lung, node positive  . On home oxygen therapy    "2-4L at night and prn" (07/18/2104)  . Oxygen dependent    at night  3liters  . Peripheral vascular disease ([place])   . Pneumonia    hx of   . Radiation 6/10   28 times right lung, and 5 treatments to sternum  . Stress incontinence   . Tobacco abuse   . Type II diabetes mellitus ([place])     Past Surgical History:  Procedure Laterality Date  . ABDOMINAL HYSTERECTOMY  1972  . ABDOMINAL HYSTERECTOMY    . APPENDECTOMY  1970  . arm fracture Left 3/14  . BACK SURGERY  2007  . CARDIAC CATHETERIZATION N/A 11/09/2015   Procedure: Left Heart Cath and Coronary Angiography;  Surgeon: Belva Crome, MD;  Location: [place] CV LAB;  Service: Cardiovascular;  Laterality: N/A;  . CARDIAC CATHETERIZATION Right 11/09/2015   Procedure: Intravascular Pressure Wire/FFR Study;  Surgeon: Belva Crome, MD;  Location: [place] CV LAB;  Service: Cardiovascular;  Laterality: Right;  . CARDIAC CATHETERIZATION N/A 11/09/2015   Procedure: Coronary Stent Intervention;  Surgeon: Belva Crome, MD;  Location: [place] [facility];  Service: Cardiovascular;  Laterality: N/A;  RCA  . CARPAL TUNNEL RELEASE    . CORONARY ANGIOPLASTY  2005  . EYE SURGERY     implants 02/18/12 (Left) 02/25/12 (right eye)  . FEMUR FRACTURE SURGERY Left    10/15  . FEMUR IM NAIL Left 07/19/2014   Procedure: INTRAMEDULLARY (IM) NAIL FEMORAL;  Surgeon: Alta Corning, MD;  Location: [place];  Service: Orthopedics;  Laterality: Left;  . GALLBLADDER SURGERY  1989  . HARDWARE REMOVAL Left 07/19/2015   Procedure: HARDWARE REMOVAL;  Surgeon: Mcarthur Rossetti, MD;  [facility];  Service:  Orthopedics;  Laterality: Left;  . JOINT REPLACEMENT    . LEFT HEART CATHETERIZATION WITH CORONARY ANGIOGRAM N/A 06/20/2014   Procedure: LEFT HEART CATHETERIZATION WITH CORONARY ANGIOGRAM;  Surgeon: Troy Sine, MD;  Location: [facility] CATH LAB;  Service: Cardiovascular;  Laterality: N/A;  . NECK SURGERY  2005  . ORIF HUMERUS FRACTURE Left 12/14/2012   Procedure: OPEN REDUCTION INTERNAL FIXATION (ORIF) LEFT PROXIMAL HUMERUS FRACTURE;  Surgeon: Mcarthur Rossetti, MD;  Location: [place];  Service: Orthopedics;  Laterality: Left;  . PNEUMONECTOMY     Right Partial, lung cancer confirmed, nod positive  . PORT-A-CATH REMOVAL  05/12/2012   Procedure: REMOVAL PORT-A-CATH;  Surgeon: Melrose Nakayama, MD;  Location: [place];  Service: Thoracic;  Laterality: Left;  . PORTACATH PLACEMENT  12/12/2007  . REFRACTIVE SURGERY Bilateral 2/14  . REPLACEMENT TOTAL KNEE  2001   left  . Right Upper Lobectomy with lymph node dissection  10/12/2008   Dr Arlyce Dice  . ROTATOR CUFF REPAIR     RIGHT SHOULDER  2 YRS  . TONSILLECTOMY  1966  . TOTAL KNEE REVISION Left 07/19/2015   Procedure: Removal Intramedullary Rod and Screws Left Knee/Femur, Revision arthroplasty Left knee;  Surgeon: Mcarthur Rossetti, MD;  [facility];  Service: Orthopedics;  Laterality: Left;    ROS:  As stated in the HPI and negative for all other systems.  PHYSICAL EXAM BP 104/62 (BP Location: Right Arm, Patient Position: Sitting, Cuff Size: Normal)   Pulse 86   Ht '5\' 2"'$  (1.575 m)   Wt 212 lb (96.2 kg)   SpO2 97%   BMI 38.78 kg/m  GENERAL:  Well appearing NECK:  No jugular venous distention, waveform within normal limits, carotid upstroke brisk and symmetric, no bruits, no thyromegaly LUNGS:  Decreased breath sounds CHEST:  Unremarkable HEART:  PMI not displaced or sustained,S1 and S2 within normal limits, no S3, no S4, no clicks, no rubs, no murmurs ABD:  Flat, positive bowel sounds normal in frequency in pitch, no bruits, no  rebound, no guarding, no midline pulsatile mass, no hepatomegaly, no splenomegaly EXT:  2 plus pulses throughout, trace edema, no cyanosis no clubbing    ASSESSMENT AND PLAN  CAD:  She has no new symptoms.  No change in therapy is planned.  She will continue with risk reduction.   Given the extent of her coronary disease and ongoing risk factors I'm going to leave her on DAPT indefinitely.  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  DM:    I asked her to follow up with [name], MD.  Her A1c is 8.1  Lab Results  Component Value Date   HGBA1C 8.1 (H) 11/09/2015    DYSLIPIDEMIA:   She will remain on the meds as listed.   Lab Results  Component Value Date   CHOL 126 11/10/2015   TRIG 289 (H) 11/10/2015   HDL 32 (L) 11/10/2015   LDLCALC 36 11/10/2015

## 2016-04-28 ENCOUNTER — Ambulatory Visit (INDEPENDENT_AMBULATORY_CARE_PROVIDER_SITE_OTHER): Payer: Medicare Other | Admitting: Cardiology

## 2016-04-28 ENCOUNTER — Encounter: Payer: Self-pay | Admitting: Cardiology

## 2016-04-28 VITALS — BP 104/62 | HR 86 | Ht 62.0 in | Wt 212.0 lb

## 2016-04-28 DIAGNOSIS — I25110 Atherosclerotic heart disease of native coronary artery with unstable angina pectoris: Secondary | ICD-10-CM

## 2016-04-28 DIAGNOSIS — I10 Essential (primary) hypertension: Secondary | ICD-10-CM | POA: Diagnosis not present

## 2016-04-28 DIAGNOSIS — F17200 Nicotine dependence, unspecified, uncomplicated: Secondary | ICD-10-CM

## 2016-04-28 NOTE — Patient Instructions (Signed)
Your physician wants you to follow-up in: 1 Year. You will receive a reminder letter in the mail two months in advance. If you don't receive a letter, please call our office to schedule the follow-up appointment.  

## 2016-05-01 DIAGNOSIS — J449 Chronic obstructive pulmonary disease, unspecified: Secondary | ICD-10-CM | POA: Diagnosis not present

## 2016-05-01 DIAGNOSIS — E869 Volume depletion, unspecified: Secondary | ICD-10-CM | POA: Diagnosis not present

## 2016-05-02 ENCOUNTER — Other Ambulatory Visit: Payer: Self-pay | Admitting: Cardiology

## 2016-05-07 DIAGNOSIS — M25561 Pain in right knee: Secondary | ICD-10-CM | POA: Diagnosis not present

## 2016-05-07 DIAGNOSIS — M25562 Pain in left knee: Secondary | ICD-10-CM | POA: Diagnosis not present

## 2016-05-22 DIAGNOSIS — J449 Chronic obstructive pulmonary disease, unspecified: Secondary | ICD-10-CM | POA: Diagnosis not present

## 2016-05-28 DIAGNOSIS — E782 Mixed hyperlipidemia: Secondary | ICD-10-CM | POA: Diagnosis not present

## 2016-05-28 DIAGNOSIS — E1165 Type 2 diabetes mellitus with hyperglycemia: Secondary | ICD-10-CM | POA: Diagnosis not present

## 2016-06-03 DIAGNOSIS — M25561 Pain in right knee: Secondary | ICD-10-CM | POA: Diagnosis not present

## 2016-06-11 DIAGNOSIS — I519 Heart disease, unspecified: Secondary | ICD-10-CM | POA: Diagnosis not present

## 2016-06-11 DIAGNOSIS — J449 Chronic obstructive pulmonary disease, unspecified: Secondary | ICD-10-CM | POA: Diagnosis not present

## 2016-06-11 DIAGNOSIS — E782 Mixed hyperlipidemia: Secondary | ICD-10-CM | POA: Diagnosis not present

## 2016-06-11 DIAGNOSIS — E1165 Type 2 diabetes mellitus with hyperglycemia: Secondary | ICD-10-CM | POA: Diagnosis not present

## 2016-06-11 DIAGNOSIS — Z23 Encounter for immunization: Secondary | ICD-10-CM | POA: Diagnosis not present

## 2016-06-11 DIAGNOSIS — I10 Essential (primary) hypertension: Secondary | ICD-10-CM | POA: Diagnosis not present

## 2016-06-17 ENCOUNTER — Ambulatory Visit (HOSPITAL_COMMUNITY)
Admission: RE | Admit: 2016-06-17 | Discharge: 2016-06-17 | Disposition: A | Discharge status: Home / Self Care | Payer: Medicare Other | Source: Ambulatory Visit | Attending: Internal Medicine | Admitting: Internal Medicine

## 2016-06-17 ENCOUNTER — Other Ambulatory Visit (HOSPITAL_COMMUNITY): Payer: Self-pay | Admitting: Internal Medicine

## 2016-06-17 ENCOUNTER — Inpatient Hospital Stay (HOSPITAL_COMMUNITY)
Admission: EM | Admit: 2016-06-17 | Discharge: 2016-06-20 | DRG: 190 | Disposition: A | Discharge status: Home / Self Care | Payer: Medicare Other | Attending: Internal Medicine | Admitting: Internal Medicine

## 2016-06-17 ENCOUNTER — Encounter (HOSPITAL_COMMUNITY): Payer: Self-pay

## 2016-06-17 DIAGNOSIS — R0902 Hypoxemia: Secondary | ICD-10-CM | POA: Diagnosis not present

## 2016-06-17 DIAGNOSIS — Z9889 Other specified postprocedural states: Secondary | ICD-10-CM

## 2016-06-17 DIAGNOSIS — J9621 Acute and chronic respiratory failure with hypoxia: Secondary | ICD-10-CM | POA: Diagnosis present

## 2016-06-17 DIAGNOSIS — R059 Cough, unspecified: Secondary | ICD-10-CM

## 2016-06-17 DIAGNOSIS — I2510 Atherosclerotic heart disease of native coronary artery without angina pectoris: Secondary | ICD-10-CM | POA: Diagnosis present

## 2016-06-17 DIAGNOSIS — I10 Essential (primary) hypertension: Secondary | ICD-10-CM | POA: Diagnosis not present

## 2016-06-17 DIAGNOSIS — Z86010 Personal history of colonic polyps: Secondary | ICD-10-CM

## 2016-06-17 DIAGNOSIS — K219 Gastro-esophageal reflux disease without esophagitis: Secondary | ICD-10-CM | POA: Diagnosis not present

## 2016-06-17 DIAGNOSIS — J441 Chronic obstructive pulmonary disease with (acute) exacerbation: Secondary | ICD-10-CM | POA: Diagnosis not present

## 2016-06-17 DIAGNOSIS — Z902 Acquired absence of lung [part of]: Secondary | ICD-10-CM

## 2016-06-17 DIAGNOSIS — R05 Cough: Secondary | ICD-10-CM | POA: Diagnosis not present

## 2016-06-17 DIAGNOSIS — Z825 Family history of asthma and other chronic lower respiratory diseases: Secondary | ICD-10-CM

## 2016-06-17 DIAGNOSIS — J449 Chronic obstructive pulmonary disease, unspecified: Secondary | ICD-10-CM | POA: Diagnosis present

## 2016-06-17 DIAGNOSIS — M797 Fibromyalgia: Secondary | ICD-10-CM | POA: Diagnosis present

## 2016-06-17 DIAGNOSIS — J069 Acute upper respiratory infection, unspecified: Secondary | ICD-10-CM | POA: Insufficient documentation

## 2016-06-17 DIAGNOSIS — E119 Type 2 diabetes mellitus without complications: Secondary | ICD-10-CM | POA: Diagnosis not present

## 2016-06-17 DIAGNOSIS — J189 Pneumonia, unspecified organism: Secondary | ICD-10-CM | POA: Diagnosis not present

## 2016-06-17 DIAGNOSIS — Z9221 Personal history of antineoplastic chemotherapy: Secondary | ICD-10-CM

## 2016-06-17 DIAGNOSIS — Z90710 Acquired absence of both cervix and uterus: Secondary | ICD-10-CM | POA: Diagnosis not present

## 2016-06-17 DIAGNOSIS — Z7902 Long term (current) use of antithrombotics/antiplatelets: Secondary | ICD-10-CM | POA: Diagnosis not present

## 2016-06-17 DIAGNOSIS — Z79899 Other long term (current) drug therapy: Secondary | ICD-10-CM

## 2016-06-17 DIAGNOSIS — N390 Urinary tract infection, site not specified: Secondary | ICD-10-CM | POA: Diagnosis not present

## 2016-06-17 DIAGNOSIS — F17210 Nicotine dependence, cigarettes, uncomplicated: Secondary | ICD-10-CM | POA: Diagnosis present

## 2016-06-17 DIAGNOSIS — Z794 Long term (current) use of insulin: Secondary | ICD-10-CM | POA: Diagnosis not present

## 2016-06-17 DIAGNOSIS — Z7982 Long term (current) use of aspirin: Secondary | ICD-10-CM

## 2016-06-17 DIAGNOSIS — Z801 Family history of malignant neoplasm of trachea, bronchus and lung: Secondary | ICD-10-CM

## 2016-06-17 DIAGNOSIS — Z833 Family history of diabetes mellitus: Secondary | ICD-10-CM

## 2016-06-17 DIAGNOSIS — Z7951 Long term (current) use of inhaled steroids: Secondary | ICD-10-CM | POA: Diagnosis not present

## 2016-06-17 DIAGNOSIS — J440 Chronic obstructive pulmonary disease with acute lower respiratory infection: Secondary | ICD-10-CM | POA: Diagnosis not present

## 2016-06-17 DIAGNOSIS — B9620 Unspecified Escherichia coli [E. coli] as the cause of diseases classified elsewhere: Secondary | ICD-10-CM | POA: Diagnosis not present

## 2016-06-17 DIAGNOSIS — Z9981 Dependence on supplemental oxygen: Secondary | ICD-10-CM | POA: Diagnosis not present

## 2016-06-17 DIAGNOSIS — R0602 Shortness of breath: Secondary | ICD-10-CM

## 2016-06-17 DIAGNOSIS — E785 Hyperlipidemia, unspecified: Secondary | ICD-10-CM | POA: Diagnosis present

## 2016-06-17 DIAGNOSIS — Z8249 Family history of ischemic heart disease and other diseases of the circulatory system: Secondary | ICD-10-CM | POA: Diagnosis not present

## 2016-06-17 DIAGNOSIS — F17200 Nicotine dependence, unspecified, uncomplicated: Secondary | ICD-10-CM | POA: Diagnosis present

## 2016-06-17 DIAGNOSIS — R918 Other nonspecific abnormal finding of lung field: Secondary | ICD-10-CM

## 2016-06-17 LAB — CBC WITH DIFFERENTIAL/PLATELET
Basophils Absolute: 0 10*3/uL (ref 0.0–0.1)
Basophils Relative: 0 %
Eosinophils Absolute: 0.1 10*3/uL (ref 0.0–0.7)
Eosinophils Relative: 1 %
HEMATOCRIT: 45.8 % (ref 36.0–46.0)
HEMOGLOBIN: 14.6 g/dL (ref 12.0–15.0)
LYMPHS ABS: 2.3 10*3/uL (ref 0.7–4.0)
LYMPHS PCT: 22 %
MCH: 25 pg — AB (ref 26.0–34.0)
MCHC: 31.9 g/dL (ref 30.0–36.0)
MCV: 78.6 fL (ref 78.0–100.0)
MONOS PCT: 8 %
Monocytes Absolute: 0.8 10*3/uL (ref 0.1–1.0)
NEUTROS ABS: 7.1 10*3/uL (ref 1.7–7.7)
NEUTROS PCT: 69 %
Platelets: 157 10*3/uL (ref 150–400)
RBC: 5.83 MIL/uL — AB (ref 3.87–5.11)
RDW: 17.2 % — ABNORMAL HIGH (ref 11.5–15.5)
WBC: 10.4 10*3/uL (ref 4.0–10.5)

## 2016-06-17 LAB — URINALYSIS, ROUTINE W REFLEX MICROSCOPIC
BILIRUBIN URINE: NEGATIVE
Glucose, UA: >1000 mg/dL — AB
HGB URINE DIPSTICK: NEGATIVE
KETONES UR: NEGATIVE mg/dL
Leukocytes, UA: NEGATIVE
Nitrite: POSITIVE — AB
PROTEIN: NEGATIVE mg/dL
Specific Gravity, Urine: 1.02 (ref 1.005–1.030)
pH: 5.5 (ref 5.0–8.0)

## 2016-06-17 LAB — COMPREHENSIVE METABOLIC PANEL
ALBUMIN: 4 g/dL (ref 3.5–5.0)
ALK PHOS: 70 U/L (ref 38–126)
ALT: 13 U/L — ABNORMAL LOW (ref 14–54)
ANION GAP: 12 (ref 5–15)
AST: 16 U/L (ref 15–41)
BUN: 24 mg/dL — ABNORMAL HIGH (ref 6–20)
CALCIUM: 9.2 mg/dL (ref 8.9–10.3)
CHLORIDE: 103 mmol/L (ref 101–111)
CO2: 23 mmol/L (ref 22–32)
Creatinine, Ser: 0.88 mg/dL (ref 0.44–1.00)
GFR calc non Af Amer: >60 mL/min (ref >60)
GLUCOSE: 124 mg/dL — AB (ref 65–99)
POTASSIUM: 3.7 mmol/L (ref 3.5–5.1)
SODIUM: 138 mmol/L (ref 135–145)
Total Bilirubin: 0.6 mg/dL (ref 0.3–1.2)
Total Protein: 7.5 g/dL (ref 6.5–8.1)

## 2016-06-17 LAB — URINE MICROSCOPIC-ADD ON
SQUAMOUS EPITHELIAL / LPF: NONE SEEN
WBC, UA: NONE SEEN WBC/hpf (ref 0–5)

## 2016-06-17 LAB — GLUCOSE, CAPILLARY
Glucose-Capillary: 67 mg/dL (ref 65–99)
Glucose-Capillary: 170 mg/dL — ABNORMAL HIGH (ref 65–99)
Glucose-Capillary: 190 mg/dL — ABNORMAL HIGH (ref 65–99)

## 2016-06-17 LAB — I-STAT CG4 LACTIC ACID, ED: Lactic Acid, Venous: 2.15 mmol/L (ref 0.5–1.9)

## 2016-06-17 MED ORDER — CLOPIDOGREL BISULFATE 75 MG PO TABS
75.0000 mg | ORAL_TABLET | Freq: Every day | ORAL | Status: DC
  Administered 2016-06-18 – 2016-06-20 (×3): 75 mg via ORAL
  Filled 2016-06-17 (×3): qty 1

## 2016-06-17 MED ORDER — INSULIN DETEMIR 100 UNIT/ML ~~LOC~~ SOLN
55.0000 [IU] | Freq: Every day | SUBCUTANEOUS | Status: DC
Start: 2016-06-17 — End: 2016-06-17
  Filled 2016-06-17: qty 0.55

## 2016-06-17 MED ORDER — DEXTROSE 5 % IV SOLN
1.0000 g | INTRAVENOUS | Status: DC
  Administered 2016-06-18 – 2016-06-19 (×2): 1 g via INTRAVENOUS
  Filled 2016-06-17 (×6): qty 10

## 2016-06-17 MED ORDER — AZITHROMYCIN 500 MG IV SOLR
500.0000 mg | Freq: Once | INTRAVENOUS | Status: AC
  Administered 2016-06-17: 500 mg via INTRAVENOUS
  Filled 2016-06-17 (×2): qty 500

## 2016-06-17 MED ORDER — IPRATROPIUM-ALBUTEROL 0.5-2.5 (3) MG/3ML IN SOLN
3.0000 mL | RESPIRATORY_TRACT | Status: DC
  Administered 2016-06-17 – 2016-06-19 (×12): 3 mL via RESPIRATORY_TRACT
  Filled 2016-06-17 (×12): qty 3

## 2016-06-17 MED ORDER — NICOTINE 21 MG/24HR TD PT24
21.0000 mg | MEDICATED_PATCH | Freq: Every day | TRANSDERMAL | Status: DC
  Administered 2016-06-18 – 2016-06-20 (×3): 21 mg via TRANSDERMAL
  Filled 2016-06-17 (×3): qty 1

## 2016-06-17 MED ORDER — PRAVASTATIN SODIUM 40 MG PO TABS
40.0000 mg | ORAL_TABLET | Freq: Every day | ORAL | Status: DC
  Administered 2016-06-17 – 2016-06-19 (×3): 40 mg via ORAL
  Filled 2016-06-17 (×3): qty 1

## 2016-06-17 MED ORDER — DEXTROSE 5 % IV SOLN
500.0000 mg | INTRAVENOUS | Status: DC
  Administered 2016-06-18 – 2016-06-19 (×2): 500 mg via INTRAVENOUS
  Filled 2016-06-17 (×6): qty 500

## 2016-06-17 MED ORDER — SODIUM CHLORIDE 0.9 % IV SOLN
INTRAVENOUS | Status: DC
  Administered 2016-06-17 – 2016-06-19 (×3): via INTRAVENOUS

## 2016-06-17 MED ORDER — NITROGLYCERIN 0.4 MG SL SUBL: 0.4000 mg | SUBLINGUAL_TABLET | SUBLINGUAL | Status: DC | PRN

## 2016-06-17 MED ORDER — SODIUM CHLORIDE 0.9 % IV BOLUS (SEPSIS)
30.0000 mL/kg | Freq: Once | INTRAVENOUS | Status: AC
  Administered 2016-06-17: 2829 mL via INTRAVENOUS

## 2016-06-17 MED ORDER — GUAIFENESIN ER 600 MG PO TB12
1200.0000 mg | ORAL_TABLET | Freq: Two times a day (BID) | ORAL | Status: DC
  Administered 2016-06-17 – 2016-06-20 (×7): 1200 mg via ORAL
  Filled 2016-06-17 (×7): qty 2

## 2016-06-17 MED ORDER — INSULIN DETEMIR 100 UNIT/ML ~~LOC~~ SOLN
45.0000 [IU] | Freq: Every day | SUBCUTANEOUS | Status: DC
  Administered 2016-06-17 – 2016-06-19 (×3): 45 [IU] via SUBCUTANEOUS
  Filled 2016-06-17 (×6): qty 0.45

## 2016-06-17 MED ORDER — INSULIN DETEMIR 100 UNIT/ML ~~LOC~~ SOLN
55.0000 [IU] | Freq: Every day | SUBCUTANEOUS | Status: DC
  Administered 2016-06-18 – 2016-06-20 (×3): 55 [IU] via SUBCUTANEOUS
  Filled 2016-06-17 (×7): qty 0.55

## 2016-06-17 MED ORDER — ENOXAPARIN SODIUM 40 MG/0.4ML ~~LOC~~ SOLN
40.0000 mg | SUBCUTANEOUS | Status: DC
  Administered 2016-06-17 – 2016-06-19 (×3): 40 mg via SUBCUTANEOUS
  Filled 2016-06-17 (×3): qty 0.4

## 2016-06-17 MED ORDER — DEXTROSE 5 % IV SOLN
1.0000 g | Freq: Once | INTRAVENOUS | Status: AC
  Administered 2016-06-17: 1 g via INTRAVENOUS
  Filled 2016-06-17: qty 10

## 2016-06-17 MED ORDER — UMECLIDINIUM BROMIDE 62.5 MCG/INH IN AEPB
1.0000 | INHALATION_SPRAY | Freq: Every day | RESPIRATORY_TRACT | Status: DC
  Filled 2016-06-17: qty 7

## 2016-06-17 MED ORDER — INSULIN ASPART 100 UNIT/ML ~~LOC~~ SOLN
0.0000 [IU] | Freq: Three times a day (TID) | SUBCUTANEOUS | Status: DC
  Administered 2016-06-18: 7 [IU] via SUBCUTANEOUS
  Administered 2016-06-18: 4 [IU] via SUBCUTANEOUS
  Administered 2016-06-18: 11 [IU] via SUBCUTANEOUS
  Administered 2016-06-19: 7 [IU] via SUBCUTANEOUS
  Administered 2016-06-19: 3 [IU] via SUBCUTANEOUS
  Administered 2016-06-19: 4 [IU] via SUBCUTANEOUS
  Administered 2016-06-20: 15 [IU] via SUBCUTANEOUS

## 2016-06-17 MED ORDER — VITAMIN D 1000 UNITS PO TABS
1000.0000 [IU] | ORAL_TABLET | Freq: Every day | ORAL | Status: DC
  Administered 2016-06-18 – 2016-06-20 (×3): 1000 [IU] via ORAL
  Filled 2016-06-17 (×7): qty 1

## 2016-06-17 MED ORDER — SODIUM CHLORIDE 0.9 % IV SOLN
INTRAVENOUS | Status: AC
  Administered 2016-06-17: 15:00:00 via INTRAVENOUS

## 2016-06-17 MED ORDER — ASPIRIN EC 81 MG PO TBEC
81.0000 mg | DELAYED_RELEASE_TABLET | Freq: Two times a day (BID) | ORAL | Status: DC
  Administered 2016-06-17 – 2016-06-20 (×6): 81 mg via ORAL
  Filled 2016-06-17 (×6): qty 1

## 2016-06-17 MED ORDER — PANTOPRAZOLE SODIUM 40 MG PO TBEC
40.0000 mg | DELAYED_RELEASE_TABLET | Freq: Every day | ORAL | Status: DC
  Administered 2016-06-17 – 2016-06-20 (×4): 40 mg via ORAL
  Filled 2016-06-17 (×4): qty 1

## 2016-06-17 MED ORDER — POLYVINYL ALCOHOL 1.4 % OP SOLN
1.0000 [drp] | Freq: Two times a day (BID) | OPHTHALMIC | Status: DC
  Administered 2016-06-17 – 2016-06-20 (×7): 1 [drp] via OPHTHALMIC
  Filled 2016-06-17 (×2): qty 15

## 2016-06-17 MED ORDER — FLUTICASONE FUROATE-VILANTEROL 100-25 MCG/INH IN AEPB
1.0000 | INHALATION_SPRAY | Freq: Every day | RESPIRATORY_TRACT | Status: DC
  Administered 2016-06-18: 1 via RESPIRATORY_TRACT
  Filled 2016-06-17: qty 28

## 2016-06-17 MED ORDER — METOPROLOL TARTRATE 25 MG PO TABS
25.0000 mg | ORAL_TABLET | Freq: Two times a day (BID) | ORAL | Status: DC
  Administered 2016-06-17 – 2016-06-20 (×6): 25 mg via ORAL
  Filled 2016-06-17 (×6): qty 1

## 2016-06-17 MED ORDER — LISINOPRIL 10 MG PO TABS
10.0000 mg | ORAL_TABLET | Freq: Every day | ORAL | Status: DC
  Administered 2016-06-18 – 2016-06-19 (×2): 10 mg via ORAL
  Filled 2016-06-17 (×3): qty 1

## 2016-06-17 MED ORDER — INSULIN ASPART 100 UNIT/ML ~~LOC~~ SOLN
0.0000 [IU] | Freq: Every day | SUBCUTANEOUS | Status: DC
  Administered 2016-06-18 – 2016-06-19 (×2): 3 [IU] via SUBCUTANEOUS

## 2016-06-17 MED ORDER — METHYLPREDNISOLONE SODIUM SUCC 125 MG IJ SOLR
80.0000 mg | Freq: Two times a day (BID) | INTRAMUSCULAR | Status: DC
  Administered 2016-06-17 – 2016-06-18 (×3): 80 mg via INTRAVENOUS
  Filled 2016-06-17 (×3): qty 2

## 2016-06-17 MED ORDER — ALBUTEROL SULFATE (2.5 MG/3ML) 0.083% IN NEBU
2.5000 mg | INHALATION_SOLUTION | RESPIRATORY_TRACT | Status: DC | PRN
  Administered 2016-06-19: 2.5 mg via RESPIRATORY_TRACT
  Filled 2016-06-17: qty 3

## 2016-06-17 MED ORDER — PREGABALIN 50 MG PO CAPS
50.0000 mg | ORAL_CAPSULE | Freq: Two times a day (BID) | ORAL | Status: DC
  Administered 2016-06-17 – 2016-06-20 (×6): 50 mg via ORAL
  Filled 2016-06-17 (×6): qty 1

## 2016-06-17 NOTE — Progress Notes (Signed)
Pt CBG 67. Pt asymptomatic and denies any discomfort. Pt offered supper. Will recheck pt's CBG.

## 2016-06-17 NOTE — ED Triage Notes (Signed)
Pt sent for a chest x ray by Dr. Nevada Crane.  Dr. Juel Burrow office reports xray shows pneumonia so pt was referred to ER for admission.  Office staff requested to be called if pt was not admitted today.   Pt says she has had cold symptoms for the past 2 weeks.  Reports went to her pcp 1 week ago and was given an injection and 2 breathing medications but no improvement.  Reports worsening SOB, generalized weakness, productive cough, and soreness in chest.  Pt on continuous 02 at home 2-3 liters.

## 2016-06-17 NOTE — ED Provider Notes (Signed)
[facility] DEPT Provider Note   CSN: [id number] Arrival date & time: 06/17/16  1007  By signing my name below, I, Shanna Cisco, attest that this documentation has been prepared under the direction and in the presence of Daleen Bo, MD. Electronically Signed: Shanna Cisco, ED Scribe. 06/17/16. 12:01 PM.  History   Chief Complaint Chief Complaint  Patient presents with  . Pneumonia   The history is provided by the patient. No language interpreter was used.   HPI Comments:  [name] is a 70 y.o. female with PMHx of COPD, HTN, DM and tobacco abuse, who presents to the Emergency Department complaining of pneumonia, which started 2 weeks ago. Pt was sent to ED by Dr. Nevada Crane due to signs of pneumonia on xray today. Pt has experienced URI symptoms for the past two weeks. Associated symptoms include worsening SOB, generalized weakness, productive cough, decreased appetite, post prandial nausea, and chest soreness. She was given 2 breathing treatments and an injection one week ago by PCP, but it has not provided relief. She is on continuous oxygen at home. Denies dizziness, fever and inability to ambulate.  Past Medical History:  Diagnosis Date  . Asthma   . Cholelithiasis   . Chronic bronchitis ([place])   . Colon polyps   . COPD (chronic obstructive pulmonary disease) (HCC)    USES O2 AT NIGHT + PRN IN DAYTIME3L  . Coronary artery disease    a. 2005 Cath/PCI: mRCA-> Cypher DES;  b. 06/2014 Cath/PCI: EF 60%. LM nl, LAD min irregs, D1 40, LCX 20p, OM2 95 (2.5x14 Resolute DES), RCA 30p, 30 ISR, 20d, PDA/PLA nl..  11/2015  Stenting of the entire RCA and LV branch off the RCA.    . Emphysema   . FH: chemotherapy 2010    had 4 times  . Fibromyalgia   . GERD (gastroesophageal reflux disease)   . H/O hiatal hernia   . Hyperlipidemia   . Hypertension   . Lung cancer ([place]) 2010   Adenocarcinoma, right lung, node positive  . On home oxygen therapy    "2-4L at night and prn" (07/18/2104)  .  Oxygen dependent    at night  3liters  . Peripheral vascular disease ([place])   . Pneumonia    hx of   . Radiation 6/10   28 times right lung, and 5 treatments to sternum  . Stress incontinence   . Tobacco abuse   . Type II diabetes mellitus [facility])     Patient Active Problem List   Diagnosis Date Noted  . Morbid obesity ([place]) 11/10/2015  . Coronary artery disease involving native coronary artery of native heart with unstable angina pectoris ([place])   . 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 Coronary artery disease   . GERD (gastroesophageal reflux disease)   . Hyperlipidemia   . COPD (chronic obstructive pulmonary disease) ([place])   . Hypertension   . Tobacco abuse 07/29/2010  . ADENOCARCINOMA, LUNG 08/28/2009  . RADIATION PNEUMONITIS 08/28/2009  . Type II diabetes mellitus ([place]) 07/30/2008  . Hyperlipidemia LDL goal <70 07/30/2008  . Essential hypertension 07/28/2008  . OXYGEN-USE OF SUPPLEMENTAL 07/28/2008    Past Surgical History:  Procedure Laterality Date  . ABDOMINAL HYSTERECTOMY  1972  . ABDOMINAL HYSTERECTOMY    . APPENDECTOMY  1970  . arm fracture Left 3/14  . BACK SURGERY  2007  . CARDIAC CATHETERIZATION N/A 11/09/2015   Procedure: Left Heart Cath and Coronary Angiography;  Surgeon: Belva Crome,  MD;  Location: [facility];  Service: Cardiovascular;  Laterality: N/A;  . CARDIAC CATHETERIZATION Right 11/09/2015   Procedure: Intravascular Pressure Wire/FFR Study;  Surgeon: Belva Crome, MD;  Location: [facility];  Service: Cardiovascular;  Laterality: Right;  . CARDIAC CATHETERIZATION N/A 11/09/2015   Procedure: Coronary Stent Intervention;  Surgeon: Belva Crome, MD;  Location: [facility];  Service: Cardiovascular;  Laterality: N/A;  RCA  . CARPAL TUNNEL RELEASE    . CORONARY ANGIOPLASTY  2005  . EYE SURGERY     implants 02/18/12 (Left) 02/25/12 (right eye)  . FEMUR FRACTURE SURGERY Left    10/15  . FEMUR IM NAIL Left  07/19/2014   Procedure: INTRAMEDULLARY (IM) NAIL FEMORAL;  Surgeon: Alta Corning, MD;  Location: [facility];  Service: Orthopedics;  Laterality: Left;  . GALLBLADDER SURGERY  1989  . HARDWARE REMOVAL Left 07/19/2015   Procedure: HARDWARE REMOVAL;  Surgeon: Mcarthur Rossetti, MD;  Location: [facility];  Service: Orthopedics;  Laterality: Left;  . JOINT REPLACEMENT    . LEFT HEART CATHETERIZATION WITH CORONARY ANGIOGRAM N/A 06/20/2014   Procedure: LEFT HEART CATHETERIZATION WITH CORONARY ANGIOGRAM;  Surgeon: Troy Sine, MD;  Location[facility];  Service: Cardiovascular;  Laterality: N/A;  . NECK SURGERY  2005  . ORIF HUMERUS FRACTURE Left 12/14/2012   Procedure: OPEN REDUCTION INTERNAL FIXATION (ORIF) LEFT PROXIMAL HUMERUS FRACTURE;  Surgeon: Mcarthur Rossetti, MD;  Location: [facility];  Service: Orthopedics;  Laterality: Left;  . PNEUMONECTOMY     Right Partial, lung cancer confirmed, nod positive  . PORT-A-CATH REMOVAL  05/12/2012   Procedure: REMOVAL PORT-A-CATH;  Surgeon: Melrose Nakayama, MD;  Location: [facility];  Service: Thoracic;  Laterality: Left;  . PORTACATH PLACEMENT  12/12/2007  . REFRACTIVE SURGERY Bilateral 2/14  . REPLACEMENT TOTAL KNEE  2001   left  . Right Upper Lobectomy with lymph node dissection  10/12/2008   Dr Arlyce Dice  . ROTATOR CUFF REPAIR     RIGHT SHOULDER  2 YRS  . TONSILLECTOMY  1966  . TOTAL KNEE REVISION Left 07/19/2015   Procedure: Removal Intramedullary Rod and Screws Left Knee/Femur, Revision arthroplasty Left knee;  Surgeon: Mcarthur Rossetti, MD;  Location: [facility];  Service: Orthopedics;  Laterality: Left;    OB History    No data available       Home Medications    Prior to Admission medications   Medication Sig Start Date End Date Taking? Authorizing Provider  acetaminophen (TYLENOL) 500 MG tablet Take 1,000 mg by mouth every 6 (six) hours as needed for mild pain.     Historical Provider, MD  albuterol (PROVENTIL) (2.5 MG/3ML) 0.083%  nebulizer solution Take 2.5 mg by nebulization every 6 (six) hours as needed for wheezing or shortness of breath.    Historical Provider, MD  albuterol-ipratropium (COMBIVENT) 18-103 MCG/ACT inhaler Inhale 1-2 puffs into the lungs every 4 (four) hours.    Historical Provider, MD  aspirin EC 81 MG tablet Take 81 mg by mouth 2 (two) times daily.    Historical Provider, MD  calcium carbonate (TUMS - DOSED IN MG ELEMENTAL CALCIUM) 500 MG chewable tablet Chew 2-3 tablets by mouth daily as needed for indigestion or heartburn.     Historical Provider, MD  Cholecalciferol (VITAMIN D) 1000 UNITS capsule Take 1,000 Units by mouth daily.     Historical Provider, MD  clopidogrel (PLAVIX) 75 MG tablet Take 1 tablet (75 mg total) by mouth  daily with breakfast. 11/10/15   Brett Canales, PA-C  empagliflozin (JARDIANCE) 25 MG TABS tablet Take 25 mg by mouth daily.    Historical Provider, MD  esomeprazole (NEXIUM) 40 MG capsule Take 40 mg by mouth as needed (for heartburn/ acid reflux).     Historical Provider, MD  furosemide (LASIX) 20 MG tablet Take 20 mg by mouth daily as needed (feet, ankle and wrist swelling).     Historical Provider, MD  insulin aspart (NOVOLOG) 100 UNIT/ML injection Inject 1,015 Units into the skin as needed. Per sliding scale: CBG <200 15 units, 200-260 20 units, >260 25 units    Historical Provider, MD  insulin detemir (LEVEMIR) 100 UNIT/ML injection Inject 40-50 Units into the skin 2 (two) times daily. Inject 50 units subcutaneously every morning and 45 units at bedtime    Historical Provider, MD  lisinopril (PRINIVIL,ZESTRIL) 10 MG tablet Take 1 tablet (10 mg total) by mouth daily. 03/11/16   Minus Breeding, MD  lovastatin (MEVACOR) 40 MG tablet Take 40 mg by mouth daily after supper.    Historical Provider, MD  metFORMIN (GLUCOPHAGE) 500 MG tablet Take 1 tablet (500 mg total) by mouth 2 (two) times daily with a meal. 11/10/15   Brett Canales, PA-C  metoprolol tartrate (LOPRESSOR) 25 MG tablet Take  12.5-25 mg by mouth 2 (two) times daily. Take 1 tablet (25 mg) by mouth every morning and 1/2 tablet (12.5 mg) with supper    Historical Provider, MD  metoprolol tartrate (LOPRESSOR) 25 MG tablet TAKE 1 TABLET BY MOUTH IN THE AM & 1/2 TAB BY MOUTH IN THE PM 05/02/16   Minus Breeding, MD  nitroGLYCERIN (NITROSTAT) 0.4 MG SL tablet Place 1 tablet (0.4 mg total) under the tongue every 5 (five) minutes x 3 doses as needed for chest pain. 06/21/14   Erlene Quan, PA-C  OXYGEN Inhale 2-3 L into the lungs continuous.     Historical Provider, MD  Polyethyl Glycol-Propyl Glycol (SYSTANE) 0.4-0.3 % SOLN Place 1 drop into both eyes 2 (two) times daily.     Historical Provider, MD  pregabalin (LYRICA) 50 MG capsule Take 50 mg by mouth 2 (two) times daily.     Historical Provider, MD    Family History Family History  Problem Relation Age of Onset  . Diabetes Mother   . Heart failure Mother   . Diabetes Father   . CAD Father 61  . Arthritis    . Lung disease    . Cancer    . Asthma      Social History Social History  Substance Use Topics  . Smoking status: Current Every Day Smoker    Packs/day: 1.00    Years: 53.00    Types: Cigarettes  . Smokeless tobacco: Never Used  . Alcohol use No     Allergies   Percocet [oxycodone-acetaminophen]; Celebrex [celecoxib]; Niacin; and Varenicline tartrate   Review of Systems Review of Systems  Constitutional: Positive for appetite change. Negative for fever.  Respiratory: Positive for cough, chest tightness, shortness of breath and wheezing.   Gastrointestinal: Positive for nausea.  Musculoskeletal: Positive for myalgias.  Neurological: Negative for dizziness.  All other systems reviewed and are negative.    Physical Exam Updated Vital Signs Ht '5\' 1"'$  (1.549 m)   Wt 208 lb (94.3 kg)   BMI 39.30 kg/m   Physical Exam  Constitutional: She is oriented to person, place, and time. She appears well-developed and well-nourished.  HENT:  Head:  Normocephalic  and atraumatic.  Eyes: Conjunctivae and EOM are normal. Pupils are equal, round, and reactive to light.  Neck: Normal range of motion and phonation normal. Neck supple.  Cardiovascular: Normal rate and regular rhythm.   Pulmonary/Chest: Effort normal. Tachypnea noted. She has wheezes ( right base). She has rales. She exhibits no tenderness.  Decreased air movement bilaterally.  Abdominal: Soft. She exhibits no distension. There is no tenderness. There is no guarding.  Musculoskeletal: Normal range of motion.  Neurological: She is alert and oriented to person, place, and time. She exhibits normal muscle tone.  Skin: Skin is warm and dry.  Psychiatric: She has a normal mood and affect. Her behavior is normal. Judgment and thought content normal.  Nursing note and vitals reviewed.    ED Treatments / Results  DIAGNOSTIC STUDIES:  Oxygen Saturation is 90% on room air, normal by my interpretation.    COORDINATION OF CARE:  11:54 AM Discussed treatment plan with pt at bedside, which include IV fluids, antibiotics, and pt agreed to plan.  Labs (all labs ordered are listed, but only abnormal results are displayed) Labs Reviewed - No data to display  EKG  EKG Interpretation None       Radiology Dg Chest 2 View  Result Date: 06/17/2016 CLINICAL DATA:  Productive cough, congestion, and sever shortness of breath fpr 2 weeks, no improvement since treatment last week; COPD, diabetes, lung cancer, hypertension EXAM: CHEST  2 VIEW COMPARISON:  11/08/2015 FINDINGS: Volume loss in the RIGHT hemithorax with elevation of RIGHT diaphragm and superior traction of hilum. Prior RIGHT upper lobectomy with scarring at RIGHT suprahilar region unchanged. New opacity at the RIGHT lung base question pneumonia though requiring followup prior imaging until resolution to exclude underlying abnormalities. Normal heart size with evidence of prior coronary artery stenting. Underlying COPD. Aortic  atherosclerosis. LEFT lung clear. No pleural effusion or pneumothorax. Diffuse osseous demineralization with prior ORIF of the proximal LEFT humerus. IMPRESSION: Postsurgical changes of RIGHT upper lobectomy with new opacity at the RIGHT lung base, question pneumonia though underlying abnormalities are not completely excluded. Followup PA and lateral chest X-ray is recommended in 3-4 weeks following trial of antibiotic therapy to ensure resolution and exclude underlying malignancy. Electronically Signed   By: Lavonia Dana M.D.   On: 06/17/2016 09:40   Procedures Procedures (including critical care time)  Medications Ordered in ED Medications - No data to display   Initial Impression / Assessment and Plan / ED Course  I have reviewed the triage vital signs and the nursing notes.  Pertinent labs & imaging results that were available during my care of the patient were reviewed by me and considered in my medical decision making (see chart for details).  Clinical Course    Medications  cefTRIAXone (ROCEPHIN) 1 g in dextrose 5 % 50 mL IVPB (1 g Intravenous New Bag/Given 06/17/16 1232)  azithromycin (ZITHROMAX) 500 mg in dextrose 5 % 250 mL IVPB (500 mg Intravenous New Bag/Given 06/17/16 1231)  sodium chloride 0.9 % bolus 2,829 mL (2,829 mLs Intravenous New Bag/Given 06/17/16 1205)    Patient Vitals for the past 24 hrs:  BP Temp Temp src Pulse Resp SpO2 Height Weight  06/17/16 1030 128/67 - - 86 - 90 % - -  06/17/16 1022 142/75 97.9 F (36.6 C) Oral 87 (!) 28 91 % - -  06/17/16 1019 - - - - - - '5\' 1"'$  (1.549 m) 208 lb (94.3 kg)    12:41 PM Reevaluation with update and  discussion. After initial assessment and treatment, an updated evaluation reveals She feels somewhat better at this time. Findings discussed with the patient and all questions answered. Koda Routon L   12:45 PM-Consult complete with Hospitalist. Patient case explained and discussed. He agrees to admit patient for further  evaluation and treatment. Call ended at 13:05  CRITICAL CARE Performed by: Richarda Blade Total critical care time: 35 minutes Critical care time was exclusive of separately billable procedures and treating other patients. Critical care was necessary to treat or prevent imminent or life-threatening deterioration. Critical care was time spent personally by me on the following activities: development of treatment plan with patient and/or surrogate as well as nursing, discussions with consultants, evaluation of patient's response to treatment, examination of patient, obtaining history from patient or surrogate, ordering and performing treatments and interventions, ordering and review of laboratory studies, ordering and review of radiographic studies, pulse oximetry and re-evaluation of patient's condition.    Final Clinical Impressions(s) / ED Diagnoses   Final diagnoses:  CAP (community acquired pneumonia)  Hypoxia  Chronic obstructive pulmonary disease, unspecified COPD type ([place])    Evaluation is consistent with pneumonia. Mildly elevated, initial lactate. Treated with high-volume fluid bolus. Initial vital signs are reassuring. Patient does have increased oxygen requirement to 4 L, by nasal cannula indicating HYPOXIA. She'll require admission for stabilization prior to consideration for disposition back home.  Nursing Notes Reviewed/ Care Coordinated, and agree without changes. Applicable Imaging Reviewed.  Interpretation of Laboratory Data incorporated into ED treatment  Plan: Admit   New Prescriptions New Prescriptions   No medications on file  I personally performed the services described in this documentation, which was scribed in my presence. The recorded information has been reviewed and is accurate.       Daleen Bo, MD 06/17/16 1311

## 2016-06-17 NOTE — H&P (Signed)
History and Physical    [name]:[id number] DOB: [birth year] DOA: 06/17/2016  PCP: [name], MD Patient coming from: home  Chief Complaint: shortness of breath  HPI: [name] is a 70 y.o. female with medical history significant of COPD, chronic respiratory failure since 2 L of oxygen, coronary artery disease, tobacco use who presents to the hospital with complaints shortness of breath. Patient reports her symptoms started approximately 1 week ago. She began to develop progressive dyspnea on exertion, wheezing, cough productive of yellow colored sputum. She also reports having fevers and chills. She had gone to her primary care physician and received a nebulizer treatment as well as steroid injection. Initially, she reports her symptoms did improve, but over the last few days they have progressively gotten worse. She has some substernal chest pain, which is worse with deep breathing and cough. She's had some nausea, no vomiting. Bowel movements are otherwise normal. Since her symptoms were not improving, she called her primary care physician who advised her to come to the emergency room for evaluation.  ED Course: In the emergency room, she was noted to be hypoxic at 89% on 2 L. Chest x-ray indicated new developing right lower lobe infiltrate consistent with pneumonia. She received Rocephin and azithromycin and has been referred for admission.  Review of Systems: As per HPI otherwise 10 point review of systems negative.    Past Medical History:  Diagnosis Date  . Asthma   . Cholelithiasis   . Chronic bronchitis ([place])   . Colon polyps   . COPD (chronic obstructive pulmonary disease) (HCC)    USES O2 AT NIGHT + PRN IN DAYTIME3L  . Coronary artery disease    a. 2005 Cath/PCI: mRCA-> Cypher DES;  b. 06/2014 Cath/PCI: EF 60%. LM nl, LAD min irregs, D1 40, LCX 20p, OM2 95 (2.5x14 Resolute DES), RCA 30p, 30 ISR, 20d, PDA/PLA nl..  11/2015  Stenting of the entire RCA and LV branch off  the RCA.    . Emphysema   . FH: chemotherapy 2010    had 4 times  . Fibromyalgia   . GERD (gastroesophageal reflux disease)   . H/O hiatal hernia   . Hyperlipidemia   . Hypertension   . Lung cancer ([place]) 2010   Adenocarcinoma, right lung, node positive  . On home oxygen therapy    "2-4L at night and prn" (07/18/2104)  . Oxygen dependent    at night  3liters  . Peripheral vascular disease ([place])   . Pneumonia    hx of   . Radiation 6/10   28 times right lung, and 5 treatments to sternum  . Stress incontinence   . Tobacco abuse   . Type II diabetes mellitus ([place])     Past Surgical History:  Procedure Laterality Date  . ABDOMINAL HYSTERECTOMY  1972  . ABDOMINAL HYSTERECTOMY    . APPENDECTOMY  1970  . arm fracture Left 3/14  . BACK SURGERY  2007  . CARDIAC CATHETERIZATION N/A 11/09/2015   Procedure: Left Heart Cath and Coronary Angiography;  Surgeon: Belva Crome, MD;  Location: [facility];  Service: Cardiovascular;  Laterality: N/A;  . CARDIAC CATHETERIZATION Right 11/09/2015   Procedure: Intravascular Pressure Wire/FFR Study;  Surgeon: Belva Crome, MD;  Location: [facility];  Service: Cardiovascular;  Laterality: Right;  . CARDIAC CATHETERIZATION N/A 11/09/2015   Procedure: Coronary Stent Intervention;  Surgeon: Belva Crome, MD;  Location: [facility];  Service:  Cardiovascular;  Laterality: N/A;  RCA  . CARPAL TUNNEL RELEASE    . CORONARY ANGIOPLASTY  2005  . EYE SURGERY     implants 02/18/12 (Left) 02/25/12 (right eye)  . FEMUR FRACTURE SURGERY Left    10/15  . FEMUR IM NAIL Left 07/19/2014   Procedure: INTRAMEDULLARY (IM) NAIL FEMORAL;  Surgeon: Alta Corning, MD;  Location: [facility];  Service: Orthopedics;  Laterality: Left;  . GALLBLADDER SURGERY  1989  . HARDWARE REMOVAL Left 07/19/2015   Procedure: HARDWARE REMOVAL;  Surgeon: Mcarthur Rossetti, MD;  Location: [facility];  Service: Orthopedics;  Laterality: Left;  . JOINT REPLACEMENT    . LEFT  HEART CATHETERIZATION WITH CORONARY ANGIOGRAM N/A 06/20/2014   Procedure: LEFT HEART CATHETERIZATION WITH CORONARY ANGIOGRAM;  Surgeon: Troy Sine, MD;  Location: [facility];  Service: Cardiovascular;  Laterality: N/A;  . NECK SURGERY  2005  . ORIF HUMERUS FRACTURE Left 12/14/2012   Procedure: OPEN REDUCTION INTERNAL FIXATION (ORIF) LEFT PROXIMAL HUMERUS FRACTURE;  Surgeon: Mcarthur Rossetti, MD;  Location: [place];  Service: Orthopedics;  Laterality: Left;  . PNEUMONECTOMY     Right Partial, lung cancer confirmed, nod positive  . PORT-A-CATH REMOVAL  05/12/2012   Procedure: REMOVAL PORT-A-CATH;  Surgeon: Melrose Nakayama, MD;  Location: [place];  Service: Thoracic;  Laterality: Left;  . PORTACATH PLACEMENT  12/12/2007  . REFRACTIVE SURGERY Bilateral 2/14  . REPLACEMENT TOTAL KNEE  2001   left  . Right Upper Lobectomy with lymph node dissection  10/12/2008   Dr Arlyce Dice  . ROTATOR CUFF REPAIR     RIGHT SHOULDER  2 YRS  . TONSILLECTOMY  1966  . TOTAL KNEE REVISION Left 07/19/2015   Procedure: Removal Intramedullary Rod and Screws Left Knee/Femur, Revision arthroplasty Left knee;  Surgeon: Mcarthur Rossetti, MD;  Location: [facility];  Service: Orthopedics;  Laterality: Left;     reports that she has been smoking Cigarettes.  She has a 53.00 pack-year smoking history. She has never used smokeless tobacco. She reports that she does not drink alcohol or use drugs.  Allergies  Allergen Reactions  . Percocet [Oxycodone-Acetaminophen] Shortness Of Breath  . Celebrex [Celecoxib] Palpitations and Other (See Comments)    Chest pain, tachycardia, diaphoresis  . Niacin Rash  . Varenicline Tartrate Rash    Reaction to CHANTIX    Family History  Problem Relation Age of Onset  . Diabetes Mother   . Heart failure Mother   . Diabetes Father   . CAD Father 43  . Arthritis    . Lung disease    . Cancer    . Asthma       Prior to Admission medications   Medication Sig Start Date End  Date Taking? Authorizing Provider  acetaminophen (TYLENOL) 500 MG tablet Take 1,000 mg by mouth every 6 (six) hours as needed for mild pain.    Yes Historical Provider, MD  albuterol (PROVENTIL) (2.5 MG/3ML) 0.083% nebulizer solution Take 2.5 mg by nebulization every 6 (six) hours as needed for wheezing or shortness of breath.   Yes Historical Provider, MD  aspirin EC 81 MG tablet Take 81 mg by mouth 2 (two) times daily.   Yes Historical Provider, MD  Cholecalciferol (VITAMIN D) 1000 UNITS capsule Take 1,000 Units by mouth daily.    Yes Historical Provider, MD  clopidogrel (PLAVIX) 75 MG tablet Take 1 tablet (75 mg total) by mouth daily with breakfast. 11/10/15  Yes Brett Canales, PA-C  empagliflozin (JARDIANCE) 25 MG TABS tablet Take 25 mg by mouth daily.   Yes Historical Provider, MD  esomeprazole (NEXIUM) 40 MG capsule Take 40 mg by mouth as needed (for heartburn/ acid reflux).    Yes Historical Provider, MD  fluticasone furoate-vilanterol (BREO ELLIPTA) 100-25 MCG/INH AEPB Inhale 1 puff into the lungs daily.   Yes Historical Provider, MD  insulin aspart (NOVOLOG) 100 UNIT/ML injection Inject 1,015 Units into the skin as needed. Per sliding scale: CBG <200 15 units, 200-260 20 units, >260 25 units   Yes Historical Provider, MD  insulin detemir (LEVEMIR) 100 UNIT/ML injection Inject 45-55 Units into the skin 2 (two) times daily. Inject 55 units subcutaneously every morning and 45 units at bedtime   Yes Historical Provider, MD  lisinopril (PRINIVIL,ZESTRIL) 10 MG tablet Take 1 tablet (10 mg total) by mouth daily. 03/11/16  Yes Minus Breeding, MD  lovastatin (MEVACOR) 40 MG tablet Take 40 mg by mouth daily after supper.   Yes Historical Provider, MD  metFORMIN (GLUCOPHAGE) 500 MG tablet Take 1 tablet (500 mg total) by mouth 2 (two) times daily with a meal. 11/10/15  Yes Brett Canales, PA-C  metoprolol tartrate (LOPRESSOR) 25 MG tablet TAKE 1 TABLET BY MOUTH IN THE AM & 1/2 TAB BY MOUTH IN THE PM 05/02/16  Yes  Minus Breeding, MD  nitroGLYCERIN (NITROSTAT) 0.4 MG SL tablet Place 1 tablet (0.4 mg total) under the tongue every 5 (five) minutes x 3 doses as needed for chest pain. 06/21/14  Yes Luke K Kilroy, PA-C  OXYGEN Inhale 2-3 L into the lungs continuous.    Yes Historical Provider, MD  Polyethyl Glycol-Propyl Glycol (SYSTANE) 0.4-0.3 % SOLN Place 1 drop into both eyes 2 (two) times daily.    Yes Historical Provider, MD  pregabalin (LYRICA) 50 MG capsule Take 50 mg by mouth 2 (two) times daily.    Yes Historical Provider, MD  umeclidinium bromide (INCRUSE ELLIPTA) 62.5 MCG/INH AEPB Inhale 1 puff into the lungs daily.   Yes Historical Provider, MD    Physical Exam: Vitals:   06/17/16 1300 06/17/16 1330 06/17/16 1338 06/17/16 1400  BP: 103/60 118/67  116/63  Pulse: 91 95  88  Resp:      Temp:   98.6 F (37 C)   TempSrc:   Oral   SpO2: 97% 97%  98%  Weight:      Height:          Constitutional: NAD, calm, comfortable Vitals:   06/17/16 1300 06/17/16 1330 06/17/16 1338 06/17/16 1400  BP: 103/60 118/67  116/63  Pulse: 91 95  88  Resp:      Temp:   98.6 F (37 C)   TempSrc:   Oral   SpO2: 97% 97%  98%  Weight:      Height:       Eyes: PERRL, lids and conjunctivae normal ENMT: Mucous membranes are moist. Posterior pharynx clear of any exudate or lesions.Normal dentition.  Neck: normal, supple, no masses, no thyromegaly Respiratory: diminished breath sounds with wheeze bilaterally. Normal respiratory effort. No accessory muscle use.  Cardiovascular: Regular rate and rhythm, no murmurs / rubs / gallops. No extremity edema. 2+ pedal pulses. No carotid bruits.  Abdomen: no tenderness, no masses palpated. No hepatosplenomegaly. Bowel sounds positive.  Musculoskeletal: no clubbing / cyanosis. No joint deformity upper and lower extremities. Good ROM, no contractures. Normal muscle tone.  Skin: no rashes, lesions, ulcers. No induration Neurologic: CN 2-12 grossly intact. Sensation intact, DTR  normal. Strength 5/5 in all 4.  Psychiatric: Normal judgment and insight. Alert and oriented x 3. Normal mood.    Labs on Admission: I have personally reviewed following labs and imaging studies  CBC:  Recent Labs Lab 06/17/16 1032  WBC 10.4  NEUTROABS 7.1  HGB 14.6  HCT 45.8  MCV 78.6  PLT 093   Basic Metabolic Panel:  Recent Labs Lab 06/17/16 1032  NA 138  K 3.7  CL 103  CO2 23  GLUCOSE 124*  BUN 24*  CREATININE 0.88  CALCIUM 9.2   GFR: Estimated Creatinine Clearance: 62.4 mL/min (by C-G formula based on SCr of 0.88 mg/dL). Liver Function Tests:  Recent Labs Lab 06/17/16 1032  AST 16  ALT 13*  ALKPHOS 70  BILITOT 0.6  PROT 7.5  ALBUMIN 4.0   No results for input(s): LIPASE, AMYLASE in the last 168 hours. No results for input(s): AMMONIA in the last 168 hours. Coagulation Profile: No results for input(s): INR, PROTIME in the last 168 hours. Cardiac Enzymes: No results for input(s): CKTOTAL, CKMB, CKMBINDEX, TROPONINI in the last 168 hours. BNP (last 3 results) No results for input(s): PROBNP in the last 8760 hours. HbA1C: No results for input(s): HGBA1C in the last 72 hours. CBG: No results for input(s): GLUCAP in the last 168 hours. Lipid Profile: No results for input(s): CHOL, HDL, LDLCALC, TRIG, CHOLHDL, LDLDIRECT in the last 72 hours. Thyroid Function Tests: No results for input(s): TSH, T4TOTAL, FREET4, T3FREE, THYROIDAB in the last 72 hours. Anemia Panel: No results for input(s): VITAMINB12, FOLATE, FERRITIN, TIBC, IRON, RETICCTPCT in the last 72 hours. Urine analysis:    Component Value Date/Time   COLORURINE YELLOW 06/17/2016 1023   APPEARANCEUR CLEAR 06/17/2016 1023   LABSPEC 1.020 06/17/2016 1023   PHURINE 5.5 06/17/2016 1023   GLUCOSEU >1000 (A) 06/17/2016 1023   HGBUR NEGATIVE 06/17/2016 1023   BILIRUBINUR NEGATIVE 06/17/2016 1023   KETONESUR NEGATIVE 06/17/2016 1023   PROTEINUR NEGATIVE 06/17/2016 1023   UROBILINOGEN 0.2  06/07/2015 1122   NITRITE POSITIVE (A) 06/17/2016 1023   LEUKOCYTESUR NEGATIVE 06/17/2016 1023   Sepsis Labs: !!!!!!!!!!!!!!!!!!!!!!!!!!!!!!!!!!!!!!!!!!!! '@LABRCNTIP'$ (procalcitonin:4,lacticidven:4) ) Recent Results (from the past 240 hour(s))  Culture, blood (Routine x 2)     Status: None (Preliminary result)   Collection Time: 06/17/16 10:32 AM  Result Value Ref Range Status   Specimen Description BLOOD  Final   Special Requests NONE  Final   Culture NO GROWTH <12 HOURS  Final   Report Status PENDING  Incomplete     Radiological Exams on Admission: Dg Chest 2 View  Result Date: 06/17/2016 CLINICAL DATA:  Productive cough, congestion, and sever shortness of breath fpr 2 weeks, no improvement since treatment last week; COPD, diabetes, lung cancer, hypertension EXAM: CHEST  2 VIEW COMPARISON:  11/08/2015 FINDINGS: Volume loss in the RIGHT hemithorax with elevation of RIGHT diaphragm and superior traction of hilum. Prior RIGHT upper lobectomy with scarring at RIGHT suprahilar region unchanged. New opacity at the RIGHT lung base question pneumonia though requiring followup prior imaging until resolution to exclude underlying abnormalities. Normal heart size with evidence of prior coronary artery stenting. Underlying COPD. Aortic atherosclerosis. LEFT lung clear. No pleural effusion or pneumothorax. Diffuse osseous demineralization with prior ORIF of the proximal LEFT humerus. IMPRESSION: Postsurgical changes of RIGHT upper lobectomy with new opacity at the RIGHT lung base, question pneumonia though underlying abnormalities are not completely excluded. Followup PA and lateral chest X-ray is recommended in 3-4 weeks following trial of antibiotic  therapy to ensure resolution and exclude underlying malignancy. Electronically Signed   By: Lavonia Dana M.D.   On: 06/17/2016 09:40    Assessment/Plan Active Problems:   Type II diabetes mellitus ([place])   Hyperlipidemia LDL goal <70   Tobacco abuse    Essential hypertension   Coronary artery disease   Hyperlipidemia   COPD exacerbation ([place])   Hypertension   CAP (community acquired pneumonia)   Acute on chronic respiratory failure with hypoxia ([place])    1. Acute on chronic respiratory failure with hypoxia . related to pneumonia and COPD exacerbation. We will wean oxygen back down to baseline requirement.  2. Community-acquired pneumonia. Continue on Rocephin and azithromycin. Urinary antigens will be ordered. Sputum culture ordered.  3. COPD exacerbation. Precipitated by underlying infection. Start the patient on nebulizer treatments, pulmonary hygiene. Will start IV steroids which can be rapidly tapered once improved.  4. Hypertension. Continue outpatient regimen  5. Coronary artery disease. Patient was admitted in 11/2015 after experiencing chest pain. Cardiac catheterization showed in-stent stenosis. Patient underwent intervention and is currently on aspirin and Plavix.  6. Hyperlipidemia. Continue statin  7. Diabetes. Start on sliding scale insulin. Hold oral agents. Continue basal insulin.  8. Tobacco abuse. Counseled on the importance of tobacco cessation.   DVT prophylaxis: lovenox Code Status: full Family Communication: discussed with daughter at the bedside Disposition Plan: discharge home once respiratory status stabilized Consults called:  Admission status: inpatient   Quintez Maselli MD [facility] Pager [phone]  If 7PM-7AM, please contact night-coverage [email or web address] Password [facility]  06/17/2016, 2:47 PM

## 2016-06-17 NOTE — Progress Notes (Signed)
Informed by patient that she placed a nicotine patch on her left arm. Pt's daughter bought nicotine patch from home and placed it on pt. MD notified. Discussed with patient and her daughter that all home medications should be verified with pharmacy, nurse and MD before use. Pt verbalized understanding and stated that she will not take any more medications from home.

## 2016-06-17 NOTE — Progress Notes (Signed)
Pt CBG 170 

## 2016-06-17 NOTE — ED Notes (Signed)
CRITICAL VALUE ALERT  Critical value received:  Lactic acid istat 2.15  Date of notification:  06/17/2016  Time of notification:  0086  Critical value read back:Yes.    Nurse who received alert:  [facility] RN  MD notified (1st page):  Dr. Eulis Foster  Time of first page:  1105  MD notified (2nd page):  Time of second page:  Responding MD:  Dr. Eulis Foster  Time MD responded:  [phone]

## 2016-06-18 DIAGNOSIS — F17200 Nicotine dependence, unspecified, uncomplicated: Secondary | ICD-10-CM

## 2016-06-18 DIAGNOSIS — J9621 Acute and chronic respiratory failure with hypoxia: Secondary | ICD-10-CM

## 2016-06-18 DIAGNOSIS — I10 Essential (primary) hypertension: Secondary | ICD-10-CM

## 2016-06-18 DIAGNOSIS — J189 Pneumonia, unspecified organism: Secondary | ICD-10-CM

## 2016-06-18 LAB — BLOOD CULTURE ID PANEL (REFLEXED)
Acinetobacter baumannii: NOT DETECTED
CANDIDA GLABRATA: NOT DETECTED
CANDIDA KRUSEI: NOT DETECTED
CANDIDA PARAPSILOSIS: NOT DETECTED
Candida albicans: NOT DETECTED
Candida tropicalis: NOT DETECTED
ESCHERICHIA COLI: NOT DETECTED
Enterobacter cloacae complex: NOT DETECTED
Enterobacteriaceae species: NOT DETECTED
Enterococcus species: NOT DETECTED
Haemophilus influenzae: NOT DETECTED
KLEBSIELLA OXYTOCA: NOT DETECTED
Klebsiella pneumoniae: NOT DETECTED
Listeria monocytogenes: NOT DETECTED
Methicillin resistance: NOT DETECTED
Neisseria meningitidis: NOT DETECTED
PROTEUS SPECIES: NOT DETECTED
Pseudomonas aeruginosa: NOT DETECTED
SERRATIA MARCESCENS: NOT DETECTED
STAPHYLOCOCCUS AUREUS BCID: NOT DETECTED
STAPHYLOCOCCUS SPECIES: DETECTED — AB
STREPTOCOCCUS PNEUMONIAE: NOT DETECTED
Streptococcus agalactiae: NOT DETECTED
Streptococcus pyogenes: NOT DETECTED
Streptococcus species: NOT DETECTED

## 2016-06-18 LAB — BASIC METABOLIC PANEL
Anion gap: 7 (ref 5–15)
BUN: 14 mg/dL (ref 6–20)
CHLORIDE: 107 mmol/L (ref 101–111)
CO2: 24 mmol/L (ref 22–32)
Calcium: 8.9 mg/dL (ref 8.9–10.3)
Creatinine, Ser: 0.73 mg/dL (ref 0.44–1.00)
GFR calc Af Amer: >60 mL/min (ref >60)
GLUCOSE: 182 mg/dL — AB (ref 65–99)
POTASSIUM: 5.4 mmol/L — AB (ref 3.5–5.1)
Sodium: 138 mmol/L (ref 135–145)

## 2016-06-18 LAB — GLUCOSE, CAPILLARY
GLUCOSE-CAPILLARY: 168 mg/dL — AB (ref 65–99)
GLUCOSE-CAPILLARY: 272 mg/dL — AB (ref 65–99)

## 2016-06-18 LAB — CBC
HEMATOCRIT: 42.6 % (ref 36.0–46.0)
Hemoglobin: 13 g/dL (ref 12.0–15.0)
MCH: 24.3 pg — AB (ref 26.0–34.0)
MCHC: 30.5 g/dL (ref 30.0–36.0)
MCV: 79.5 fL (ref 78.0–100.0)
Platelets: 157 10*3/uL (ref 150–400)
RBC: 5.36 MIL/uL — ABNORMAL HIGH (ref 3.87–5.11)
RDW: 16.9 % — AB (ref 11.5–15.5)
WBC: 8.8 10*3/uL (ref 4.0–10.5)

## 2016-06-18 LAB — HIV ANTIBODY (ROUTINE TESTING W REFLEX): HIV SCREEN 4TH GENERATION: NONREACTIVE

## 2016-06-18 LAB — MRSA PCR SCREENING: MRSA by PCR: POSITIVE — AB

## 2016-06-18 LAB — STREP PNEUMONIAE URINARY ANTIGEN: Strep Pneumo Urinary Antigen: NEGATIVE

## 2016-06-18 MED ORDER — FLUTICASONE FUROATE-VILANTEROL 100-25 MCG/INH IN AEPB
1.0000 | INHALATION_SPRAY | Freq: Every day | RESPIRATORY_TRACT | Status: DC
  Administered 2016-06-19 – 2016-06-20 (×2): 1 via RESPIRATORY_TRACT
  Filled 2016-06-18: qty 28

## 2016-06-18 MED ORDER — MUPIROCIN 2 % EX OINT
1.0000 "application " | TOPICAL_OINTMENT | Freq: Two times a day (BID) | CUTANEOUS | Status: DC
  Administered 2016-06-18 – 2016-06-20 (×4): 1 via NASAL
  Filled 2016-06-18: qty 22

## 2016-06-18 MED ORDER — CHLORHEXIDINE GLUCONATE CLOTH 2 % EX PADS
6.0000 | MEDICATED_PAD | Freq: Every day | CUTANEOUS | Status: DC
  Administered 2016-06-19 – 2016-06-20 (×2): 6 via TOPICAL

## 2016-06-18 NOTE — Progress Notes (Signed)
CRITICAL VALUE ALERT  Critical value received:  Anaerobic- gram positive cocci in pairs  Date of notification:  06/18/16  Time of notification:  1227  Critical value read back:Yes.    Nurse who received alert:  Theola Sequin RN  MD notified (1st page):  Coralyn Pear MD  Time of first page:  1228  MD notified (2nd page):  Time of second page:  Responding MD:    Time MD responded:

## 2016-06-18 NOTE — Progress Notes (Signed)
PROGRESS NOTE    [name]  NGE:[id number] DOB: 05-02-1946 DOA: 06/17/2016 PCP: [name], MD   Brief Narrative:  [name] is a 70 year old female with a past medical history of ongoing tobacco abuse, chronic pulmonary disease, chronic respiratory failure on 2 L supplemental oxygen, admitted to the medical service on 06/17/2016 when she presented with complaints of shortness of breath associate with cough and sputum production. Symptoms felt to be secondary to COPD exacerbation that was likely precipitated by committee quite pneumonia. She was started on empiric IV antibiotic therapy with ceftriaxone, azithromycin along with scheduled nebulizer treatments and systemic steroids for COPD exacerbation   Assessment & Plan:   Active Problems:   Type II diabetes mellitus ([place])   Hyperlipidemia LDL goal <70   Tobacco abuse   Essential hypertension   Coronary artery disease   Hyperlipidemia   COPD exacerbation ([place])   Hypertension   CAP (community acquired pneumonia)   Acute on chronic respiratory failure with hypoxia ([place])  1.  Chronic obstructive pulmonary disease exacerbation -[name] having a history of tobacco abuse, presenting with complaints of shortness of breath, wheezing, cough -Unfortunate she continues to smoke. -Continue empiric IV antibiotic therapy, Solu-Medrol 80 mg IV twice a day along with scheduled nebulizer treatments every 4 hours.  2.  Community acquired pneumonia -Chest x-ray showing new opacity at the right lung base which could represent pneumonia. -She was started on empiric IV antibiotic therapy with ceftriaxone and azithromycin -Microbiology reporting gram-positive cocci in pairs from one bottle. She is otherwise nontoxic appearing, afebrile, will await culture identification  3.  Acute on chronic respiratory failure -Evidenced by an O2 sat of 89%, patient presenting in respiratory distress, having increased oxygen requirement -Likely precipitated by  COPD exacerbation and community-acquired pneumonia -As mentioned above treating COPD and pneumonia with steroids and antibiotic therapy  4.  Insulin-dependent diabetes mellitus -Continue Levemir 55 units in a.m. and 45 units subcutaneous at bedtime -Monitor blood sugars as she is on systemic steroids  5.  History of coronary artery disease -She underwent cardiac catheterization earlier this year that revealed in-stent stenosis. -Continue inhaled dual agent at that point the therapy with aspirin and Plavix, along with beta blocker, ACE inhibitor, statin  6.  Hypertension -Continue lisinopril 10 mg by mouth daily and metoprolol tartrate 25 mg by mouth twice a day  DVT prophylaxis: Lovenox Code Status: Full code Family Communication:  Disposition Plan:   Antimicrobials:   Ceftriaxone  Azithromycin   Subjective: She reports having ongoing shortness of breath, cough, wheezing  Objective: Vitals:   06/18/16 0515 06/18/16 0747 06/18/16 0755 06/18/16 1129  BP: 136/64     Pulse: 80     Resp: (!) 25     Temp: 97.7 F (36.5 C)     TempSrc: Oral     SpO2: 94% 97% 100% 93%  Weight:      Height:        Intake/Output Summary (Last 24 hours) at 06/18/16 1227 Last data filed at 06/18/16 0930  Gross per 24 hour  Intake          1432.92 ml  Output             1450 ml  Net           -17.08 ml   Filed Weights   06/17/16 1019  Weight: 94.3 kg (208 lb)    Examination:  General exam: Appears dyspneic at rest particularly with talking Respiratory system: Diminished breath sounds  bilaterally, having bilateral exotropia wheezes Cardiovascular system: S1 & S2 heard, RRR. No JVD, murmurs, rubs, gallops or clicks. No pedal edema. Gastrointestinal system: Abdomen is nondistended, soft and nontender. No organomegaly or masses felt. Normal bowel sounds heard. Central nervous system: Alert and oriented. No focal neurological deficits. Extremities: Symmetric 5 x 5 power. Skin: No rashes,  lesions or ulcers Psychiatry: Judgement and insight appear normal. Mood & affect appropriate.     Data Reviewed: I have personally reviewed following labs and imaging studies  CBC:  Recent Labs Lab 06/17/16 1032 06/18/16 0557  WBC 10.4 8.8  NEUTROABS 7.1  --   HGB 14.6 13.0  HCT 45.8 42.6  MCV 78.6 79.5  PLT 157 732   Basic Metabolic Panel:  Recent Labs Lab 06/17/16 1032 06/18/16 0557  NA 138 138  K 3.7 5.4*  CL 103 107  CO2 23 24  GLUCOSE 124* 182*  BUN 24* 14  CREATININE 0.88 0.73  CALCIUM 9.2 8.9   GFR: Estimated Creatinine Clearance: 71.5 mL/min (by C-G formula based on SCr of 0.73 mg/dL). Liver Function Tests:  Recent Labs Lab 06/17/16 1032  AST 16  ALT 13*  ALKPHOS 70  BILITOT 0.6  PROT 7.5  ALBUMIN 4.0   No results for input(s): LIPASE, AMYLASE in the last 168 hours. No results for input(s): AMMONIA in the last 168 hours. Coagulation Profile: No results for input(s): INR, PROTIME in the last 168 hours. Cardiac Enzymes: No results for input(s): CKTOTAL, CKMB, CKMBINDEX, TROPONINI in the last 168 hours. BNP (last 3 results) No results for input(s): PROBNP in the last 8760 hours. HbA1C: No results for input(s): HGBA1C in the last 72 hours. CBG:  Recent Labs Lab 06/17/16 1700 06/17/16 1840 06/17/16 1955 06/18/16 0749  GLUCAP 67 170* 190* 168*   Lipid Profile: No results for input(s): CHOL, HDL, LDLCALC, TRIG, CHOLHDL, LDLDIRECT in the last 72 hours. Thyroid Function Tests: No results for input(s): TSH, T4TOTAL, FREET4, T3FREE, THYROIDAB in the last 72 hours. Anemia Panel: No results for input(s): VITAMINB12, FOLATE, FERRITIN, TIBC, IRON, RETICCTPCT in the last 72 hours. Sepsis Labs:  Recent Labs Lab 06/17/16 1045  LATICACIDVEN 2.15*    Recent Results (from the past 240 hour(s))  Culture, blood (Routine x 2)     Status: None (Preliminary result)   Collection Time: 06/17/16 10:32 AM  Result Value Ref Range Status   Specimen  Description BLOOD RIGHT ARM DRAWN BY RN  Final   Special Requests   Final    BOTTLES DRAWN AEROBIC AND ANAEROBIC AEB=5CC ANA=4CC   Culture NO GROWTH < 24 HOURS  Final   Report Status PENDING  Incomplete  Culture, blood (Routine x 2)     Status: None (Preliminary result)   Collection Time: 06/17/16 11:00 AM  Result Value Ref Range Status   Specimen Description BLOOD LEFT ANTECUBITAL  Final   Special Requests BOTTLES DRAWN AEROBIC AND ANAEROBIC 8CC EACH  Final   Culture  Setup Time   Final    GRAM POSITIVE COCCI IN PAIRS RECOVERED FROM THE ANAEROBIC BOTTLE. Gram Stain Report Called to,Read Back By and Verified With: EVERETT,R. AT 1224 ON 06/18/2016 BY BAUGHAM,M. Performed at [facility]    Culture PENDING  Incomplete   Report Status PENDING  Incomplete         Radiology Studies: Dg Chest 2 View  Result Date: 06/17/2016 CLINICAL DATA:  Productive cough, congestion, and sever shortness of breath fpr 2 weeks, no improvement since treatment last week;  COPD, diabetes, lung cancer, hypertension EXAM: CHEST  2 VIEW COMPARISON:  11/08/2015 FINDINGS: Volume loss in the RIGHT hemithorax with elevation of RIGHT diaphragm and superior traction of hilum. Prior RIGHT upper lobectomy with scarring at RIGHT suprahilar region unchanged. New opacity at the RIGHT lung base question pneumonia though requiring followup prior imaging until resolution to exclude underlying abnormalities. Normal heart size with evidence of prior coronary artery stenting. Underlying COPD. Aortic atherosclerosis. LEFT lung clear. No pleural effusion or pneumothorax. Diffuse osseous demineralization with prior ORIF of the proximal LEFT humerus. IMPRESSION: Postsurgical changes of RIGHT upper lobectomy with new opacity at the RIGHT lung base, question pneumonia though underlying abnormalities are not completely excluded. Followup PA and lateral chest X-ray is recommended in 3-4 weeks following trial of antibiotic therapy to  ensure resolution and exclude underlying malignancy. Electronically Signed   By: Lavonia Dana M.D.   On: 06/17/2016 09:40        Scheduled Meds: . aspirin EC  81 mg Oral BID  . azithromycin  500 mg Intravenous Q24H  . cefTRIAXone (ROCEPHIN)  IV  1 g Intravenous Q24H  . cholecalciferol  1,000 Units Oral Daily  . clopidogrel  75 mg Oral Q breakfast  . enoxaparin (LOVENOX) injection  40 mg Subcutaneous Q24H  . fluticasone furoate-vilanterol  1 puff Inhalation Daily  . guaiFENesin  1,200 mg Oral BID  . insulin aspart  0-20 Units Subcutaneous TID WC  . insulin aspart  0-5 Units Subcutaneous QHS  . insulin detemir  45 Units Subcutaneous QHS  . insulin detemir  55 Units Subcutaneous Daily  . ipratropium-albuterol  3 mL Nebulization Q4H  . lisinopril  10 mg Oral Daily  . methylPREDNISolone (SOLU-MEDROL) injection  80 mg Intravenous Q12H  . metoprolol tartrate  25 mg Oral BID  . nicotine  21 mg Transdermal Daily  . pantoprazole  40 mg Oral Daily  . polyvinyl alcohol  1 drop Both Eyes BID  . pravastatin  40 mg Oral q1800  . pregabalin  50 mg Oral BID  . umeclidinium bromide  1 puff Inhalation Daily   Continuous Infusions: . sodium chloride 75 mL/hr at 06/17/16 1641     LOS: 1 day    Time spent: 35 minutes    Kelvin Cellar, MD [facility] Pager [phone]  If 7PM-7AM, please contact night-coverage [email or web address] Password [email or web address] 06/18/2016, 12:27 PM

## 2016-06-18 NOTE — Care Management Important Message (Signed)
Important Message  Patient Details  Name: [name] MRN: [id number] Date of Birth: [birth year]   Medicare Important Message Given:  Yes    Terrel Manalo, Chauncey Reading, RN 06/18/2016, 2:42 PM

## 2016-06-18 NOTE — Progress Notes (Signed)
CRITICAL VALUE ALERT  Critical value received:  Anaerobic Stain showed STAPHYLOCOCCUS SPECIES   Date of notification:  06/18/16  Time of notification:  1924  Critical value read back:Yes.    Nurse who received alert:  Theola Sequin RN  MD notified (1st page):   [place]   Time of first page:  1943  MD notified (2nd page):  Time of second page:  Responding MD:   Time MD responded:

## 2016-06-19 ENCOUNTER — Inpatient Hospital Stay (HOSPITAL_COMMUNITY): Payer: Medicare Other

## 2016-06-19 DIAGNOSIS — J441 Chronic obstructive pulmonary disease with (acute) exacerbation: Secondary | ICD-10-CM

## 2016-06-19 LAB — CBC
HCT: 42.6 % (ref 36.0–46.0)
HEMOGLOBIN: 12.9 g/dL (ref 12.0–15.0)
MCH: 24.1 pg — ABNORMAL LOW (ref 26.0–34.0)
MCHC: 30.3 g/dL (ref 30.0–36.0)
MCV: 79.6 fL (ref 78.0–100.0)
Platelets: 147 10*3/uL — ABNORMAL LOW (ref 150–400)
RBC: 5.35 MIL/uL — ABNORMAL HIGH (ref 3.87–5.11)
RDW: 16.9 % — AB (ref 11.5–15.5)
WBC: 10.4 10*3/uL (ref 4.0–10.5)

## 2016-06-19 LAB — BASIC METABOLIC PANEL
ANION GAP: 6 (ref 5–15)
BUN: 17 mg/dL (ref 6–20)
CALCIUM: 8.9 mg/dL (ref 8.9–10.3)
CO2: 27 mmol/L (ref 22–32)
Chloride: 107 mmol/L (ref 101–111)
Creatinine, Ser: 0.83 mg/dL (ref 0.44–1.00)
GFR calc Af Amer: >60 mL/min (ref >60)
GLUCOSE: 153 mg/dL — AB (ref 65–99)
Potassium: 4.3 mmol/L (ref 3.5–5.1)
SODIUM: 140 mmol/L (ref 135–145)

## 2016-06-19 LAB — URINE CULTURE

## 2016-06-19 LAB — LEGIONELLA PNEUMOPHILA SEROGP 1 UR AG: L. PNEUMOPHILA SEROGP 1 UR AG: NEGATIVE

## 2016-06-19 LAB — GLUCOSE, CAPILLARY
GLUCOSE-CAPILLARY: 241 mg/dL — AB (ref 65–99)
GLUCOSE-CAPILLARY: 212 mg/dL — AB (ref 65–99)
Glucose-Capillary: 285 mg/dL — ABNORMAL HIGH (ref 65–99)
Glucose-Capillary: 141 mg/dL — ABNORMAL HIGH (ref 65–99)
Glucose-Capillary: 269 mg/dL — ABNORMAL HIGH (ref 65–99)
Glucose-Capillary: 168 mg/dL — ABNORMAL HIGH (ref 65–99)

## 2016-06-19 MED ORDER — PREDNISONE 20 MG PO TABS
60.0000 mg | ORAL_TABLET | Freq: Every day | ORAL | Status: DC
  Administered 2016-06-20: 60 mg via ORAL
  Filled 2016-06-19: qty 3

## 2016-06-19 MED ORDER — IPRATROPIUM-ALBUTEROL 0.5-2.5 (3) MG/3ML IN SOLN
3.0000 mL | RESPIRATORY_TRACT | Status: DC
  Administered 2016-06-20 (×3): 3 mL via RESPIRATORY_TRACT
  Filled 2016-06-19 (×3): qty 3

## 2016-06-19 MED ORDER — ONDANSETRON HCL 4 MG/2ML IJ SOLN
4.0000 mg | Freq: Four times a day (QID) | INTRAMUSCULAR | Status: DC | PRN
  Administered 2016-06-19: 4 mg via INTRAVENOUS
  Filled 2016-06-19: qty 2

## 2016-06-19 MED ORDER — FUROSEMIDE 10 MG/ML IJ SOLN
40.0000 mg | Freq: Once | INTRAMUSCULAR | Status: AC
  Administered 2016-06-19: 40 mg via INTRAVENOUS
  Filled 2016-06-19: qty 4

## 2016-06-19 NOTE — Care Management Note (Signed)
Case Management Note  Patient Details  Name: [name] MRN: [id number] Date of Birth: [birth year]  Subjective/Objective:  Patient adm from home with CAP. Her daughter lives with her and offers support. She has a cane and walker at home, if needed. She has oxygen provided by [employer]. She plans to return home when discharged. She has declined any Home health services.                  Action/Plan: Anticipate DC home with self care. No CM needs.    Expected Discharge Date:        06/20/2016          Expected Discharge Plan:  Home/Self Care  In-House Referral:  NA  Discharge planning Services  CM Consult  Post Acute Care Choice:  NA Choice offered to:  NA  DME Arranged:    DME Agency:     HH Arranged:    HH Agency:     Status of Service:  Completed, signed off  If discussed at [employer] of Stay Meetings, dates discussed:    Additional Comments:  Darren Caldron, Chauncey Reading, RN 06/19/2016, 11:36 AM

## 2016-06-19 NOTE — Progress Notes (Signed)
PROGRESS NOTE    [name]  NKN:[id number] DOB: Sep 08, 1946 DOA: 06/17/2016 PCP: [name], MD   Brief Narrative:  [name] is a 70 year old female with a past medical history of ongoing tobacco abuse, chronic pulmonary disease, chronic respiratory failure on 2 L supplemental oxygen, admitted to the medical service on 06/17/2016 when she presented with complaints of shortness of breath associate with cough and sputum production. Symptoms felt to be secondary to COPD exacerbation that was likely precipitated by committee quite pneumonia. She was started on empiric IV antibiotic therapy with ceftriaxone, azithromycin along with scheduled nebulizer treatments and systemic steroids for COPD exacerbation   Assessment & Plan:   Active Problems:   Type II diabetes mellitus ([place])   Hyperlipidemia LDL goal <70   Tobacco abuse   Essential hypertension   Coronary artery disease   Hyperlipidemia   COPD exacerbation ([place])   Hypertension   CAP (community acquired pneumonia)   Acute on chronic respiratory failure with hypoxia ([place])  1.  Chronic obstructive pulmonary disease exacerbation -Rita Sachdeva having a history of tobacco abuse, presenting with complaints of shortness of breath, wheezing, cough -Unfortunately she continues to smoke. -Continue empiric IV antibiotic therapy, Solu-Medrol 80 mg IV twice a day along with scheduled nebulizer treatments every 4 hours. -On 06/19/2016 showing clinical improvement, continue one more day of IV steroids  2.  Community acquired pneumonia -Chest x-ray showing new opacity at the right lung base which could represent pneumonia. -She was started on empiric IV antibiotic therapy with ceftriaxone and azithromycin -Microbiology reporting gram-positive cocci in pairs from one bottle. Microbiology so far reporting Staphylococcus species. She is otherwise nontoxic appearing, afebrile, will await culture identification. I suspect this will likely be a contaminant.    3.  Acute on chronic respiratory failure -Evidenced by an O2 sat of 89%, patient presenting in respiratory distress, having increased oxygen requirement -Likely precipitated by COPD exacerbation and community-acquired pneumonia -As mentioned above treating COPD and pneumonia with steroids and antibiotic therapy  4.  Insulin-dependent diabetes mellitus -Continue Levemir 55 units in a.m. and 45 units subcutaneous at bedtime -Monitor blood sugars as she is on systemic steroids  5.  History of coronary artery disease -She underwent cardiac catheterization earlier this year that revealed in-stent stenosis. -Continue inhaled dual agent at that point the therapy with aspirin and Plavix, along with beta blocker, ACE inhibitor, statin  6.  Hypertension -Continue lisinopril 10 mg by mouth daily and metoprolol tartrate 25 mg by mouth twice a day  7.  Urinary tract infection -Urine cultures growing Escherichia coli, organism being pan susceptible -Should be adequately covered with ceftriaxone  DVT prophylaxis: Lovenox Code Status: Full code Family Communication: Spoke to her daughter Disposition Plan: Anticipate discharge to her home in the next 24 hours  Antimicrobials:   Ceftriaxone  Azithromycin   Subjective: Today she states feeling much better, breathing easier, tolerating physical exertion, we ambulated down the hallway  Objective: Vitals:   06/18/16 2303 06/19/16 0353 06/19/16 0805 06/19/16 1137  BP:  126/62    Pulse:  81    Resp:  18    Temp:  97.8 F (36.6 C)    TempSrc:  Oral    SpO2: 94% 98% 99% 97%  Weight:      Height:        Intake/Output Summary (Last 24 hours) at 06/19/16 1333 Last data filed at 06/19/16 0607  Gross per 24 hour  Intake  1931.25 ml  Output              400 ml  Net          1531.25 ml   Filed Weights   06/17/16 1019  Weight: 94.3 kg (208 lb)    Examination:  General exam: Looks much better today last this may  take Respiratory system: Interim improvement to lung exam with decreased wheezing, better air movement Cardiovascular system: S1 & S2 heard, RRR. No JVD, murmurs, rubs, gallops or clicks. No pedal edema. Gastrointestinal system: Abdomen is nondistended, soft and nontender. No organomegaly or masses felt. Normal bowel sounds heard. Central nervous system: Alert and oriented. No focal neurological deficits. Extremities: Symmetric 5 x 5 power. Skin: No rashes, lesions or ulcers Psychiatry: Judgement and insight appear normal. Mood & affect appropriate.     Data Reviewed: I have personally reviewed following labs and imaging studies  CBC:  Recent Labs Lab 06/17/16 1032 06/18/16 0557 06/19/16 0642  WBC 10.4 8.8 10.4  NEUTROABS 7.1  --   --   HGB 14.6 13.0 12.9  HCT 45.8 42.6 42.6  MCV 78.6 79.5 79.6  PLT 157 157 280*   Basic Metabolic Panel:  Recent Labs Lab 06/17/16 1032 06/18/16 0557 06/19/16 0642  NA 138 138 140  K 3.7 5.4* 4.3  CL 103 107 107  CO2 '23 24 27  '$ GLUCOSE 124* 182* 153*  BUN 24* 14 17  CREATININE 0.88 0.73 0.83  CALCIUM 9.2 8.9 8.9   GFR: Estimated Creatinine Clearance: 68.9 mL/min (by C-G formula based on SCr of 0.83 mg/dL). Liver Function Tests:  Recent Labs Lab 06/17/16 1032  AST 16  ALT 13*  ALKPHOS 70  BILITOT 0.6  PROT 7.5  ALBUMIN 4.0   No results for input(s): LIPASE, AMYLASE in the last 168 hours. No results for input(s): AMMONIA in the last 168 hours. Coagulation Profile: No results for input(s): INR, PROTIME in the last 168 hours. Cardiac Enzymes: No results for input(s): CKTOTAL, CKMB, CKMBINDEX, TROPONINI in the last 168 hours. BNP (last 3 results) No results for input(s): PROBNP in the last 8760 hours. HbA1C: No results for input(s): HGBA1C in the last 72 hours. CBG:  Recent Labs Lab 06/18/16 0749 06/18/16 1148 06/18/16 1643 06/18/16 1954 06/19/16 0727  GLUCAP 168* 272* 241* 285* 141*   Lipid Profile: No results for  input(s): CHOL, HDL, LDLCALC, TRIG, CHOLHDL, LDLDIRECT in the last 72 hours. Thyroid Function Tests: No results for input(s): TSH, T4TOTAL, FREET4, T3FREE, THYROIDAB in the last 72 hours. Anemia Panel: No results for input(s): VITAMINB12, FOLATE, FERRITIN, TIBC, IRON, RETICCTPCT in the last 72 hours. Sepsis Labs:  Recent Labs Lab 06/17/16 1045  LATICACIDVEN 2.15*    Recent Results (from the past 240 hour(s))  Culture, blood (Routine x 2)     Status: None (Preliminary result)   Collection Time: 06/17/16 10:32 AM  Result Value Ref Range Status   Specimen Description BLOOD RIGHT ARM DRAWN BY RN  Final   Special Requests   Final    BOTTLES DRAWN AEROBIC AND ANAEROBIC AEB=5CC ANA=4CC   Culture NO GROWTH 2 DAYS  Final   Report Status PENDING  Incomplete  Culture, blood (Routine x 2)     Status: Abnormal (Preliminary result)   Collection Time: 06/17/16 11:00 AM  Result Value Ref Range Status   Specimen Description BLOOD LEFT ANTECUBITAL  Final   Special Requests BOTTLES DRAWN AEROBIC AND ANAEROBIC [place]  Final   Culture  Setup  Time   Final    GRAM POSITIVE COCCI IN PAIRS RECOVERED FROM THE ANAEROBIC BOTTLE. Gram Stain Report Called to,Read Back By and Verified With: EVERETT,R. AT 1224 ON 06/18/2016 BY BAUGHAM,M. Performed at [place], READ BACK BY AND VERIFIED WITH: R. EVERETT, RN ([facility]) AT [place] ON 06/18/16 BY C. JESSUP, MLT. Performed at [place] (A)  Final   Report Status PENDING  Incomplete  Blood Culture ID Panel (Reflexed)     Status: Abnormal   Collection Time: 06/17/16 11:00 AM  Result Value Ref Range Status   Enterococcus species NOT DETECTED NOT DETECTED Final   Listeria monocytogenes NOT DETECTED NOT DETECTED Final   Staphylococcus species DETECTED (A) NOT DETECTED Final    Comment: CRITICAL RESULT CALLED TO, READ BACK BY AND VERIFIED WITH: R, EVERETT, RN([facility]) AT 1921 ON 06/18/16 BY C.  JESSUP, MLT.    Staphylococcus aureus NOT DETECTED NOT DETECTED Final   Methicillin resistance NOT DETECTED NOT DETECTED Final   Streptococcus species NOT DETECTED NOT DETECTED Final   Streptococcus agalactiae NOT DETECTED NOT DETECTED Final   Streptococcus pneumoniae NOT DETECTED NOT DETECTED Final   Streptococcus pyogenes NOT DETECTED NOT DETECTED Final   Acinetobacter baumannii NOT DETECTED NOT DETECTED Final   Enterobacteriaceae species NOT DETECTED NOT DETECTED Final   Enterobacter cloacae complex NOT DETECTED NOT DETECTED Final   Escherichia coli NOT DETECTED NOT DETECTED Final   Klebsiella oxytoca NOT DETECTED NOT DETECTED Final   Klebsiella pneumoniae NOT DETECTED NOT DETECTED Final   Proteus species NOT DETECTED NOT DETECTED Final   Serratia marcescens NOT DETECTED NOT DETECTED Final   Haemophilus influenzae NOT DETECTED NOT DETECTED Final   Neisseria meningitidis NOT DETECTED NOT DETECTED Final   Pseudomonas aeruginosa NOT DETECTED NOT DETECTED Final   Candida albicans NOT DETECTED NOT DETECTED Final   Candida glabrata NOT DETECTED NOT DETECTED Final   Candida krusei NOT DETECTED NOT DETECTED Final   Candida parapsilosis NOT DETECTED NOT DETECTED Final   Candida tropicalis NOT DETECTED NOT DETECTED Final    Comment: Performed at [facility]  Urine culture     Status: Abnormal   Collection Time: 06/17/16 11:35 AM  Result Value Ref Range Status   Specimen Description URINE, CATHETERIZED  Final   Special Requests NONE  Final   Culture >=100,000 COLONIES/mL ESCHERICHIA COLI (A)  Final   Report Status 06/19/2016 FINAL  Final   Organism ID, Bacteria ESCHERICHIA COLI (A)  Final      Susceptibility   Escherichia coli - MIC*    AMPICILLIN 4 SENSITIVE Sensitive     CEFAZOLIN <=4 SENSITIVE Sensitive     CEFTRIAXONE <=1 SENSITIVE Sensitive     CIPROFLOXACIN <=0.25 SENSITIVE Sensitive     GENTAMICIN <=1 SENSITIVE Sensitive     IMIPENEM <=0.25 SENSITIVE Sensitive      NITROFURANTOIN <=16 SENSITIVE Sensitive     TRIMETH/SULFA <=20 SENSITIVE Sensitive     AMPICILLIN/SULBACTAM <=2 SENSITIVE Sensitive     PIP/TAZO <=4 SENSITIVE Sensitive     Extended ESBL NEGATIVE Sensitive     * >=100,000 COLONIES/mL ESCHERICHIA COLI  Culture, respiratory (NON-Expectorated)     Status: None (Preliminary result)   Collection Time: 06/18/16  1:15 PM  Result Value Ref Range Status   Specimen Description EXPECTORATED SPUTUM  Final   Special Requests NONE  Final   Gram Stain   Final    ABUNDANT WBC PRESENT, PREDOMINANTLY PMN RARE  GRAM POSITIVE COCCI IN PAIRS RARE GRAM NEGATIVE RODS RARE GRAM POSITIVE RODS FEW SQUAMOUS EPITHELIAL CELLS PRESENT    Culture   Final    CULTURE REINCUBATED FOR BETTER GROWTH Performed at [facility]    Report Status PENDING  Incomplete  MRSA PCR Screening     Status: Abnormal   Collection Time: 06/18/16  1:48 PM  Result Value Ref Range Status   MRSA by PCR POSITIVE (A) NEGATIVE Final    Comment:        The GeneXpert MRSA Assay (FDA approved for NASAL specimens only), is one component of a comprehensive MRSA colonization surveillance program. It is not intended to diagnose MRSA infection nor to guide or monitor treatment for MRSA infections. RESULT CALLED TO, READ BACK BY AND VERIFIED WITH: EVERETT,R. AT 1818 ON 06/18/2016 BY BAUGHAM,M.          Radiology Studies: No results found.      Scheduled Meds: . aspirin EC  81 mg Oral BID  . azithromycin  500 mg Intravenous Q24H  . cefTRIAXone (ROCEPHIN)  IV  1 g Intravenous Q24H  . Chlorhexidine Gluconate Cloth  6 each Topical Q0600  . cholecalciferol  1,000 Units Oral Daily  . clopidogrel  75 mg Oral Q breakfast  . enoxaparin (LOVENOX) injection  40 mg Subcutaneous Q24H  . fluticasone furoate-vilanterol  1 puff Inhalation Daily  . guaiFENesin  1,200 mg Oral BID  . insulin aspart  0-20 Units Subcutaneous TID WC  . insulin aspart  0-5 Units Subcutaneous QHS  .  insulin detemir  45 Units Subcutaneous QHS  . insulin detemir  55 Units Subcutaneous Daily  . ipratropium-albuterol  3 mL Nebulization Q4H  . lisinopril  10 mg Oral Daily  . methylPREDNISolone (SOLU-MEDROL) injection  80 mg Intravenous Q12H  . metoprolol tartrate  25 mg Oral BID  . mupirocin ointment  1 application Nasal BID  . nicotine  21 mg Transdermal Daily  . pantoprazole  40 mg Oral Daily  . polyvinyl alcohol  1 drop Both Eyes BID  . pravastatin  40 mg Oral q1800  . pregabalin  50 mg Oral BID   Continuous Infusions:     LOS: 2 days    Time spent: 30 minutes    Kelvin Cellar, MD [facility] Pager [phone]  If 7PM-7AM, please contact night-coverage [email or web address] Password [email or web address] 06/19/2016, 1:33 PM

## 2016-06-19 NOTE — Progress Notes (Signed)
PHARMACY - PHYSICIAN COMMUNICATION CRITICAL VALUE ALERT - BLOOD CULTURE IDENTIFICATION (BCID)  Results for orders placed or performed during the hospital encounter of 06/17/16  Blood Culture ID Panel (Reflexed) (Collected: 06/17/2016 11:00 AM)  Result Value Ref Range   Enterococcus species NOT DETECTED NOT DETECTED   Listeria monocytogenes NOT DETECTED NOT DETECTED   Staphylococcus species DETECTED (A) NOT DETECTED   Staphylococcus aureus NOT DETECTED NOT DETECTED   Methicillin resistance NOT DETECTED NOT DETECTED   Streptococcus species NOT DETECTED NOT DETECTED   Streptococcus agalactiae NOT DETECTED NOT DETECTED   Streptococcus pneumoniae NOT DETECTED NOT DETECTED   Streptococcus pyogenes NOT DETECTED NOT DETECTED   Acinetobacter baumannii NOT DETECTED NOT DETECTED   Enterobacteriaceae species NOT DETECTED NOT DETECTED   Enterobacter cloacae complex NOT DETECTED NOT DETECTED   Escherichia coli NOT DETECTED NOT DETECTED   Klebsiella oxytoca NOT DETECTED NOT DETECTED   Klebsiella pneumoniae NOT DETECTED NOT DETECTED   Proteus species NOT DETECTED NOT DETECTED   Serratia marcescens NOT DETECTED NOT DETECTED   Haemophilus influenzae NOT DETECTED NOT DETECTED   Neisseria meningitidis NOT DETECTED NOT DETECTED   Pseudomonas aeruginosa NOT DETECTED NOT DETECTED   Candida albicans NOT DETECTED NOT DETECTED   Candida glabrata NOT DETECTED NOT DETECTED   Candida krusei NOT DETECTED NOT DETECTED   Candida parapsilosis NOT DETECTED NOT DETECTED   Candida tropicalis NOT DETECTED NOT DETECTED    Name of physician (or Provider) Contacted: Dr. Coralyn Pear  Changes to prescribed antibiotics required: no CHANGES.  Patient has 1 of 2 blood cultures + for Staph species, not MRSA, most likely CNS. Patient is nontoxic appearing, afebrile. Continue to monitor.   Isac Sarna, BS Pharm D, [place] Clinical Pharmacist Pager [phone] 06/19/2016  2:17 PM

## 2016-06-20 LAB — CULTURE, BLOOD (ROUTINE X 2)

## 2016-06-20 LAB — GLUCOSE, CAPILLARY
GLUCOSE-CAPILLARY: 243 mg/dL — AB (ref 65–99)
GLUCOSE-CAPILLARY: 313 mg/dL — AB (ref 65–99)

## 2016-06-20 MED ORDER — PREDNISONE 10 MG (21) PO TBPK: ORAL_TABLET | ORAL | 0 refills | Status: DC

## 2016-06-20 MED ORDER — FUROSEMIDE 20 MG PO TABS: 20.0000 mg | ORAL_TABLET | Freq: Every day | ORAL | 0 refills | Status: AC | PRN

## 2016-06-20 MED ORDER — CEFUROXIME AXETIL 500 MG PO TABS: 500.0000 mg | ORAL_TABLET | Freq: Two times a day (BID) | ORAL | 0 refills | Status: DC

## 2016-06-20 NOTE — Care Management Important Message (Signed)
Important Message  Patient Details  Name: [name] MRN: [id number] Date of Birth: 07-Jul-1946   Medicare Important Message Given:  Yes    Sherald Barge, RN 06/20/2016, 12:47 PM

## 2016-06-20 NOTE — Progress Notes (Signed)
Patient discharged with instructions, prescription, and care notes.  Verbalized understanding via teach back.  IV was removed and the site was WNL. Patient voiced no further complaints or concerns at the time of discharge.  Appointments scheduled per instructions.  Patient left the floor via w/c family  And staff in stable condition. 

## 2016-06-20 NOTE — Discharge Summary (Signed)
Physician Discharge Summary  [name] XHB:[id number] DOB: May 01, 1946 DOA: 06/17/2016  PCP: Rita Neighbors, MD  Admit date: 06/17/2016 Discharge date: 06/20/2016  Time spent: 35 minutes  Recommendations for Outpatient Follow-up:  1. Please follow up respiratory status, she was admitted for COPD exacerbation 2. Please follow up on volume status, she had evidence of volume overload during this hospitalization, discharged on as needed lasix.    Discharge Diagnoses:  Active Problems:   Type II diabetes mellitus ([name])   Hyperlipidemia LDL goal <70   Tobacco abuse   Essential hypertension   Coronary artery disease   Hyperlipidemia   COPD exacerbation ([name])   Hypertension   CAP (community acquired pneumonia)   Acute on chronic respiratory failure with hypoxia ([name])   Discharge Condition: Stable  Diet recommendation: Heart Healthy  Filed Weights   06/17/16 1019  Weight: 94.3 kg (208 lb)    History of present illness:   [name] is a 70 y.o. female with medical history significant of COPD, chronic respiratory failure since 2 L of oxygen, coronary artery disease, tobacco use who presents to the hospital with complaints shortness of breath. Patient reports her symptoms started approximately 1 week ago. She began to develop progressive dyspnea on exertion, wheezing, cough productive of yellow colored sputum. She also reports having fevers and chills. She had gone to her primary care physician and received a nebulizer treatment as well as steroid injection. Initially, she reports her symptoms did improve, but over the last few days they have progressively gotten worse. She has some substernal chest pain, which is worse with deep breathing and cough. She's had some nausea, no vomiting. Bowel movements are otherwise normal. Since her symptoms were not improving, she called her primary care physician who advised her to come to the emergency room for evaluation.  Hospital Course:  Rita  [name] is a 70 year old female with a past medical history of ongoing tobacco abuse, chronic pulmonary disease, chronic respiratory failure on 2 L supplemental oxygen, admitted to the medical service on 06/17/2016 when she presented with complaints of shortness of breath associate with cough and sputum production. Symptoms felt to be secondary to COPD exacerbation that was likely precipitated by committee quite pneumonia. She was started on empiric IV antibiotic therapy with ceftriaxone, azithromycin along with scheduled nebulizer treatments and systemic steroids for COPD exacerbation  1.  Chronic obstructive pulmonary disease exacerbation -[name] having a history of tobacco abuse, presenting with complaints of shortness of breath, wheezing, cough -Unfortunately she continues to smoke. -Continue empiric IV antibiotic therapy, Solu-Medrol 80 mg IV twice a day along with scheduled nebulizer treatments every 4 hours. -On 06/19/2016 showing clinical improvement, continue one more day of IV steroids -On 06/20/2016 she continued to improve as Solu-Medrol was changed to prednisone. She was discharge on a Prednisone taper  2.  Community acquired pneumonia -Chest x-ray showing new opacity at the right lung base which could represent pneumonia. -She was started on empiric IV antibiotic therapy with ceftriaxone and azithromycin -Microbiology reporting gram-positive cocci in pairs from one bottle. Microbiology so far reporting Staphylococcus species. She is otherwise nontoxic appearing, afebrile, will await culture identification. I suspect this will likely be a contaminant.  -Microbiology reporting Coag neg Staph -She was discharged on Ceftin  3.  Acute on chronic respiratory failure -Evidenced by an O2 sat of 89%, patient presenting in respiratory distress, having increased oxygen requirement -Likely precipitated by COPD exacerbation and community-acquired pneumonia -Now back to her baseline  4.  Insulin-dependent diabetes mellitus -Continue Levemir 55 units in a.m. and 45 units subcutaneous at bedtime -Monitor blood sugars as she is on systemic steroids  5.  History of coronary artery disease -She underwent cardiac catheterization earlier this year that revealed in-stent stenosis. -Continue inhaled dual agent at that point the therapy with aspirin and Plavix, along with beta blocker, ACE inhibitor, statin  6.  Hypertension -Continue lisinopril 10 mg by mouth daily and metoprolol tartrate 25 mg by mouth twice a day  7.  Urinary tract infection -Urine cultures growing Escherichia coli, organism being pan susceptible -Should be adequately covered with ceftriaxone -Discharged on Ceftin   Discharge Exam: Vitals:   06/20/16 0507 06/20/16 1053  BP: (!) 108/53 (!) 114/53  Pulse: 62 86  Resp: 16   Temp: 97.6 F (36.4 C)     General exam: Nontoxic, well appearing Respiratory system: Interim improvement to lung exam with decreased wheezing, better air movement Cardiovascular system: S1 & S2 heard, RRR. No JVD, murmurs, rubs, gallops or clicks. No pedal edema. Gastrointestinal system: Abdomen is nondistended, soft and nontender. No organomegaly or masses felt. Normal bowel sounds heard. Central nervous system: Alert and oriented. No focal neurological deficits. Extremities: Symmetric 5 x 5 power. Skin: No rashes, lesions or ulcers Psychiatry: Judgement and insight appear normal. Mood & affect appropriate.   Discharge Instructions   Discharge Instructions    Call MD for:    Complete by:  As directed    Call MD for:  difficulty breathing, headache or visual disturbances    Complete by:  As directed    Call MD for:  extreme fatigue    Complete by:  As directed    Call MD for:  hives    Complete by:  As directed    Call MD for:  persistant dizziness or light-headedness    Complete by:  As directed    Call MD for:  persistant nausea and vomiting    Complete by:  As  directed    Call MD for:  redness, tenderness, or signs of infection (pain, swelling, redness, odor or green/yellow discharge around incision site)    Complete by:  As directed    Call MD for:  severe uncontrolled pain    Complete by:  As directed    Call MD for:  temperature >100.4    Complete by:  As directed    Diet - low sodium heart healthy    Complete by:  As directed    Increase activity slowly    Complete by:  As directed      Current Discharge Medication List    START taking these medications   Details  cefUROXime (CEFTIN) 500 MG tablet Take 1 tablet (500 mg total) by mouth 2 (two) times daily with a meal. Qty: 8 tablet, Refills: 0    furosemide (LASIX) 20 MG tablet Take 1 tablet (20 mg total) by mouth daily as needed for edema. Qty: 20 tablet, Refills: 0    predniSONE (STERAPRED UNI-PAK 21 TAB) 10 MG (21) TBPK tablet Take 6-5-4-3-2-1 tablets by mouth daily till gone. Qty: 21 tablet, Refills: 0      CONTINUE these medications which have NOT CHANGED   Details  acetaminophen (TYLENOL) 500 MG tablet Take 1,000 mg by mouth every 6 (six) hours as needed for mild pain.     albuterol (PROVENTIL) (2.5 MG/3ML) 0.083% nebulizer solution Take 2.5 mg by nebulization every 6 (six) hours as needed for wheezing or shortness of breath.  aspirin EC 81 MG tablet Take 81 mg by mouth 2 (two) times daily.    Cholecalciferol (VITAMIN D) 1000 UNITS capsule Take 1,000 Units by mouth daily.     clopidogrel (PLAVIX) 75 MG tablet Take 1 tablet (75 mg total) by mouth daily with breakfast. Qty: 30 tablet, Refills: 11    empagliflozin (JARDIANCE) 25 MG TABS tablet Take 25 mg by mouth daily.    esomeprazole (NEXIUM) 40 MG capsule Take 40 mg by mouth as needed (for heartburn/ acid reflux).     fluticasone furoate-vilanterol (BREO ELLIPTA) 100-25 MCG/INH AEPB Inhale 1 puff into the lungs daily.    insulin aspart (NOVOLOG) 100 UNIT/ML injection Inject 1,015 Units into the skin as needed. Per  sliding scale: CBG <200 15 units, 200-260 20 units, >260 25 units    insulin detemir (LEVEMIR) 100 UNIT/ML injection Inject 45-55 Units into the skin 2 (two) times daily. Inject 55 units subcutaneously every morning and 45 units at bedtime    lisinopril (PRINIVIL,ZESTRIL) 10 MG tablet Take 1 tablet (10 mg total) by mouth daily. Qty: 60 tablet, Refills: 6    lovastatin (MEVACOR) 40 MG tablet Take 40 mg by mouth daily after supper.    metFORMIN (GLUCOPHAGE) 500 MG tablet Take 1 tablet (500 mg total) by mouth 2 (two) times daily with a meal.    metoprolol tartrate (LOPRESSOR) 25 MG tablet TAKE 1 TABLET BY MOUTH IN THE AM & 1/2 TAB BY MOUTH IN THE PM Qty: 135 tablet, Refills: 3    nitroGLYCERIN (NITROSTAT) 0.4 MG SL tablet Place 1 tablet (0.4 mg total) under the tongue every 5 (five) minutes x 3 doses as needed for chest pain. Qty: 25 tablet, Refills: 2    Polyethyl Glycol-Propyl Glycol (SYSTANE) 0.4-0.3 % SOLN Place 1 drop into both eyes 2 (two) times daily.     pregabalin (LYRICA) 50 MG capsule Take 50 mg by mouth 2 (two) times daily.     umeclidinium bromide (INCRUSE ELLIPTA) 62.5 MCG/INH AEPB Inhale 1 puff into the lungs daily.      STOP taking these medications     OXYGEN        Allergies  Allergen Reactions  . Percocet [Oxycodone-Acetaminophen] Shortness Of Breath  . Celebrex [Celecoxib] Palpitations and Other (See Comments)    Chest pain, tachycardia, diaphoresis  . Niacin Rash  . Varenicline Tartrate Rash    Reaction to [place], MD Follow up in 1 week(s).   Specialty:  Internal Medicine Contact information: [place] [address] [phone]        HAWKINS,EDWARD L, MD Follow up in 2 week(s).   Specialty:  Pulmonary Disease Contact information: [place] [address] [phone]            The results of significant diagnostics from this hospitalization (including  imaging, microbiology, ancillary and laboratory) are listed below for reference.    Significant Diagnostic Studies: Dg Chest 2 View  Result Date: 06/17/2016 CLINICAL DATA:  Productive cough, congestion, and sever shortness of breath fpr 2 weeks, no improvement since treatment last week; COPD, diabetes, lung cancer, hypertension EXAM: CHEST  2 VIEW COMPARISON:  11/08/2015 FINDINGS: Volume loss in the RIGHT hemithorax with elevation of RIGHT diaphragm and superior traction of hilum. Prior RIGHT upper lobectomy with scarring at RIGHT suprahilar region unchanged. New opacity at the RIGHT lung base question pneumonia though requiring followup prior imaging until resolution to exclude  underlying abnormalities. Normal heart size with evidence of prior coronary artery stenting. Underlying COPD. Aortic atherosclerosis. LEFT lung clear. No pleural effusion or pneumothorax. Diffuse osseous demineralization with prior ORIF of the proximal LEFT humerus. IMPRESSION: Postsurgical changes of RIGHT upper lobectomy with new opacity at the RIGHT lung base, question pneumonia though underlying abnormalities are not completely excluded. Followup PA and lateral chest X-ray is recommended in 3-4 weeks following trial of antibiotic therapy to ensure resolution and exclude underlying malignancy. Electronically Signed   By: Lavonia Dana M.D.   On: 06/17/2016 09:40   Dg Chest Port 1 View  Result Date: 06/19/2016 CLINICAL DATA:  Sudden onset of nausea and shortness of Breath EXAM: PORTABLE CHEST 1 VIEW COMPARISON:  06/17/2016 FINDINGS: Normal heart size. There is asymmetric elevation of the right hemidiaphragm. Postsurgical changes involving the right hemi thorax of right upper lobectomy is again noted. Persistence of opacity overlying the right lung base. Left lung is relatively clear. IMPRESSION: 1. Persistent opacity overlying the right lung base which may represent superimposed infection. Followup PA and lateral chest X-ray is  recommended in 3-4 weeks following trial of antibiotic therapy to ensure resolution and exclude underlying malignancy. Electronically Signed   By: Kerby Moors M.D.   On: 06/19/2016 15:11    Microbiology: Recent Results (from the past 240 hour(s))  Culture, blood (Routine x 2)     Status: None (Preliminary result)   Collection Time: 06/17/16 10:32 AM  Result Value Ref Range Status   Specimen Description BLOOD RIGHT ARM DRAWN BY RN  Final   Special Requests   Final    BOTTLES DRAWN AEROBIC AND ANAEROBIC AEB=5CC ANA=4CC   Culture NO GROWTH 3 DAYS  Final   Report Status PENDING  Incomplete  Culture, blood (Routine x 2)     Status: Abnormal   Collection Time: 06/17/16 11:00 AM  Result Value Ref Range Status   Specimen Description BLOOD LEFT ANTECUBITAL  Final   Special Requests BOTTLES DRAWN AEROBIC AND ANAEROBIC 8CC EACH  Final   Culture  Setup Time   Final    GRAM POSITIVE COCCI IN PAIRS RECOVERED FROM THE ANAEROBIC BOTTLE. Gram Stain Report Called to,Read Back By and Verified With: EVERETT,R. AT 1224 ON 06/18/2016 BY BAUGHAM,M. Performed at [facility], READ BACK BY AND VERIFIED WITH: R. EVERETT, RN ([facility]) AT [place] ON 06/18/16 BY C. JESSUP, MLT.    Culture (A)  Final    STAPHYLOCOCCUS SPECIES (COAGULASE NEGATIVE) THE SIGNIFICANCE OF ISOLATING THIS ORGANISM FROM A SINGLE SET OF BLOOD CULTURES WHEN MULTIPLE SETS ARE DRAWN IS UNCERTAIN. PLEASE NOTIFY THE MICROBIOLOGY DEPARTMENT WITHIN ONE WEEK IF SPECIATION AND SENSITIVITIES ARE REQUIRED. Performed at [facility]    Report Status 06/20/2016 FINAL  Final  Blood Culture ID Panel (Reflexed)     Status: Abnormal   Collection Time: 06/17/16 11:00 AM  Result Value Ref Range Status   Enterococcus species NOT DETECTED NOT DETECTED Final   Listeria monocytogenes NOT DETECTED NOT DETECTED Final   Staphylococcus species DETECTED (A) NOT DETECTED Final    Comment: CRITICAL RESULT CALLED TO, READ BACK BY  AND VERIFIED WITH: R, EVERETT, RN([facility]) AT 1921 ON 06/18/16 BY C. JESSUP, MLT.    Staphylococcus aureus NOT DETECTED NOT DETECTED Final   Methicillin resistance NOT DETECTED NOT DETECTED Final   Streptococcus species NOT DETECTED NOT DETECTED Final   Streptococcus agalactiae NOT DETECTED NOT DETECTED Final   Streptococcus pneumoniae NOT DETECTED NOT DETECTED Final  Streptococcus pyogenes NOT DETECTED NOT DETECTED Final   Acinetobacter baumannii NOT DETECTED NOT DETECTED Final   Enterobacteriaceae species NOT DETECTED NOT DETECTED Final   Enterobacter cloacae complex NOT DETECTED NOT DETECTED Final   Escherichia coli NOT DETECTED NOT DETECTED Final   Klebsiella oxytoca NOT DETECTED NOT DETECTED Final   Klebsiella pneumoniae NOT DETECTED NOT DETECTED Final   Proteus species NOT DETECTED NOT DETECTED Final   Serratia marcescens NOT DETECTED NOT DETECTED Final   Haemophilus influenzae NOT DETECTED NOT DETECTED Final   Neisseria meningitidis NOT DETECTED NOT DETECTED Final   Pseudomonas aeruginosa NOT DETECTED NOT DETECTED Final   Candida albicans NOT DETECTED NOT DETECTED Final   Candida glabrata NOT DETECTED NOT DETECTED Final   Candida krusei NOT DETECTED NOT DETECTED Final   Candida parapsilosis NOT DETECTED NOT DETECTED Final   Candida tropicalis NOT DETECTED NOT DETECTED Final    Comment: Performed at [facility]  Urine culture     Status: Abnormal   Collection Time: 06/17/16 11:35 AM  Result Value Ref Range Status   Specimen Description URINE, CATHETERIZED  Final   Special Requests NONE  Final   Culture >=100,000 COLONIES/mL ESCHERICHIA COLI (A)  Final   Report Status 06/19/2016 FINAL  Final   Organism ID, Bacteria ESCHERICHIA COLI (A)  Final      Susceptibility   Escherichia coli - MIC*    AMPICILLIN 4 SENSITIVE Sensitive     CEFAZOLIN <=4 SENSITIVE Sensitive     CEFTRIAXONE <=1 SENSITIVE Sensitive     CIPROFLOXACIN <=0.25 SENSITIVE Sensitive     GENTAMICIN <=1  SENSITIVE Sensitive     IMIPENEM <=0.25 SENSITIVE Sensitive     NITROFURANTOIN <=16 SENSITIVE Sensitive     TRIMETH/SULFA <=20 SENSITIVE Sensitive     AMPICILLIN/SULBACTAM <=2 SENSITIVE Sensitive     PIP/TAZO <=4 SENSITIVE Sensitive     Extended ESBL NEGATIVE Sensitive     * >=100,000 COLONIES/mL ESCHERICHIA COLI  Culture, respiratory (NON-Expectorated)     Status: None (Preliminary result)   Collection Time: 06/18/16  1:15 PM  Result Value Ref Range Status   Specimen Description EXPECTORATED SPUTUM  Final   Special Requests NONE  Final   Gram Stain   Final    ABUNDANT WBC PRESENT, PREDOMINANTLY PMN RARE GRAM POSITIVE COCCI IN PAIRS RARE GRAM NEGATIVE RODS RARE GRAM POSITIVE RODS FEW SQUAMOUS EPITHELIAL CELLS PRESENT    Culture   Final    CULTURE REINCUBATED FOR BETTER GROWTH Performed at [facility]    Report Status PENDING  Incomplete  MRSA PCR Screening     Status: Abnormal   Collection Time: 06/18/16  1:48 PM  Result Value Ref Range Status   MRSA by PCR POSITIVE (A) NEGATIVE Final    Comment:        The GeneXpert MRSA Assay (FDA approved for NASAL specimens only), is one component of a comprehensive MRSA colonization surveillance program. It is not intended to diagnose MRSA infection nor to guide or monitor treatment for MRSA infections. RESULT CALLED TO, READ BACK BY AND VERIFIED WITH: EVERETT,R. AT 1818 ON 06/18/2016 BY BAUGHAM,M.      Labs: Basic Metabolic Panel:  Recent Labs Lab 06/17/16 1032 06/18/16 0557 06/19/16 0642  NA 138 138 140  K 3.7 5.4* 4.3  CL 103 107 107  CO2 '23 24 27  '$ GLUCOSE 124* 182* 153*  BUN 24* 14 17  CREATININE 0.88 0.73 0.83  CALCIUM 9.2 8.9 8.9   Liver Function Tests:  Recent Labs Lab 06/17/16 1032  AST 16  ALT 13*  ALKPHOS 70  BILITOT 0.6  PROT 7.5  ALBUMIN 4.0   No results for input(s): LIPASE, AMYLASE in the last 168 hours. No results for input(s): AMMONIA in the last 168 hours. CBC:  Recent  Labs Lab 06/17/16 1032 06/18/16 0557 06/19/16 0642  WBC 10.4 8.8 10.4  NEUTROABS 7.1  --   --   HGB 14.6 13.0 12.9  HCT 45.8 42.6 42.6  MCV 78.6 79.5 79.6  PLT 157 157 147*   Cardiac Enzymes: No results for input(s): CKTOTAL, CKMB, CKMBINDEX, TROPONINI in the last 168 hours. BNP: BNP (last 3 results)  Recent Labs  11/09/15 1330  BNP 18.2    ProBNP (last 3 results) No results for input(s): PROBNP in the last 8760 hours.  CBG:  Recent Labs Lab 06/19/16 1151 06/19/16 1431 06/19/16 1601 06/19/16 2102 06/20/16 1043  GLUCAP 212* 269* 168* 243* 313*       Signed:  Kelvin Cellar MD.  [facility] 06/20/2016, 12:55 PM

## 2016-06-21 LAB — CULTURE, RESPIRATORY

## 2016-06-21 LAB — CULTURE, RESPIRATORY W GRAM STAIN: Culture: NORMAL

## 2016-06-22 DIAGNOSIS — J449 Chronic obstructive pulmonary disease, unspecified: Secondary | ICD-10-CM | POA: Diagnosis not present

## 2016-06-23 LAB — CULTURE, BLOOD (ROUTINE X 2): CULTURE: NO GROWTH

## 2016-06-27 ENCOUNTER — Other Ambulatory Visit (HOSPITAL_COMMUNITY): Payer: Self-pay | Admitting: Adult Health Nurse Practitioner

## 2016-06-27 ENCOUNTER — Ambulatory Visit (HOSPITAL_COMMUNITY)
Admission: RE | Admit: 2016-06-27 | Discharge: 2016-06-27 | Disposition: A | Discharge status: Home / Self Care | Payer: Medicare Other | Source: Ambulatory Visit | Attending: Adult Health Nurse Practitioner | Admitting: Adult Health Nurse Practitioner

## 2016-06-27 DIAGNOSIS — R0602 Shortness of breath: Secondary | ICD-10-CM

## 2016-06-27 DIAGNOSIS — J189 Pneumonia, unspecified organism: Secondary | ICD-10-CM | POA: Diagnosis not present

## 2016-07-02 DIAGNOSIS — J449 Chronic obstructive pulmonary disease, unspecified: Secondary | ICD-10-CM | POA: Diagnosis not present

## 2016-07-02 DIAGNOSIS — J188 Other pneumonia, unspecified organism: Secondary | ICD-10-CM | POA: Diagnosis not present

## 2016-07-03 ENCOUNTER — Telehealth: Payer: Self-pay | Admitting: Internal Medicine

## 2016-07-03 NOTE — Telephone Encounter (Signed)
RECEIVED FORWARDED MESSAGE BY DESK NURSE FROM PATIENT REQUESTING APPOINTMENT WITH MM. RESCHEDULED CX AUGUST 2015 F/U. GAVE PATIENT APPOINTMENTS FOR 10/10 @ 10:30 AM.

## 2016-07-15 ENCOUNTER — Ambulatory Visit (HOSPITAL_BASED_OUTPATIENT_CLINIC_OR_DEPARTMENT_OTHER): Payer: Medicare Other | Admitting: Internal Medicine

## 2016-07-15 ENCOUNTER — Encounter: Payer: Self-pay | Admitting: Internal Medicine

## 2016-07-15 ENCOUNTER — Telehealth: Payer: Self-pay | Admitting: Internal Medicine

## 2016-07-15 VITALS — BP 123/57 | HR 93 | Temp 98.0°F | Resp 18 | Ht 63.0 in | Wt 205.9 lb

## 2016-07-15 DIAGNOSIS — C3491 Malignant neoplasm of unspecified part of right bronchus or lung: Secondary | ICD-10-CM

## 2016-07-15 DIAGNOSIS — Z720 Tobacco use: Secondary | ICD-10-CM | POA: Diagnosis not present

## 2016-07-15 DIAGNOSIS — Z85118 Personal history of other malignant neoplasm of bronchus and lung: Secondary | ICD-10-CM

## 2016-07-15 NOTE — Patient Instructions (Signed)
Smoking Cessation, Tips for Success If you are ready to quit smoking, congratulations! You have chosen to help yourself be healthier. Cigarettes bring nicotine, tar, carbon monoxide, and other irritants into your body. Your lungs, heart, and blood vessels will be able to work better without these poisons. There are many different ways to quit smoking. Nicotine gum, nicotine patches, a nicotine inhaler, or nicotine nasal spray can help with physical craving. Hypnosis, support groups, and medicines help break the habit of smoking. WHAT THINGS CAN I DO TO MAKE QUITTING EASIER?  Here are some tips to help you quit for good:  Pick a date when you will quit smoking completely. Tell all of your friends and family about your plan to quit on that date.  Do not try to slowly cut down on the number of cigarettes you are smoking. Pick a quit date and quit smoking completely starting on that day.  Throw away all cigarettes.   Clean and remove all ashtrays from your home, work, and car.  On a card, write down your reasons for quitting. Carry the card with you and read it when you get the urge to smoke.  Cleanse your body of nicotine. Drink enough water and fluids to keep your urine clear or pale yellow. Do this after quitting to flush the nicotine from your body.  Learn to predict your moods. Do not let a bad situation be your excuse to have a cigarette. Some situations in your life might tempt you into wanting a cigarette.  Never have "just one" cigarette. It leads to wanting another and another. Remind yourself of your decision to quit.  Change habits associated with smoking. If you smoked while driving or when feeling stressed, try other activities to replace smoking. Stand up when drinking your coffee. Brush your teeth after eating. Sit in a different chair when you read the paper. Avoid alcohol while trying to quit, and try to drink fewer caffeinated beverages. Alcohol and caffeine may urge you to  smoke.  Avoid foods and drinks that can trigger a desire to smoke, such as sugary or spicy foods and alcohol.  Ask people who smoke not to smoke around you.  Have something planned to do right after eating or having a cup of coffee. For example, plan to take a walk or exercise.  Try a relaxation exercise to calm you down and decrease your stress. Remember, you may be tense and nervous for the first 2 weeks after you quit, but this will pass.  Find new activities to keep your hands busy. Play with a pen, coin, or rubber band. Doodle or draw things on paper.  Brush your teeth right after eating. This will help cut down on the craving for the taste of tobacco after meals. You can also try mouthwash.   Use oral substitutes in place of cigarettes. Try using lemon drops, carrots, cinnamon sticks, or chewing gum. Keep them handy so they are available when you have the urge to smoke.  When you have the urge to smoke, try deep breathing.  Designate your home as a nonsmoking area.  If you are a heavy smoker, ask your health care provider about a prescription for nicotine chewing gum. It can ease your withdrawal from nicotine.  Reward yourself. Set aside the cigarette money you save and buy yourself something nice.  Look for support from others. Join a support group or smoking cessation program. Ask someone at home or at work to help you with your plan   to quit smoking.  Always ask yourself, "Do I need this cigarette or is this just a reflex?" Tell yourself, "Today, I choose not to smoke," or "I do not want to smoke." You are reminding yourself of your decision to quit.  Do not replace cigarette smoking with electronic cigarettes (commonly called e-cigarettes). The safety of e-cigarettes is unknown, and some may contain harmful chemicals.  If you relapse, do not give up! Plan ahead and think about what you will do the next time you get the urge to smoke. HOW WILL I FEEL WHEN I QUIT SMOKING? You  may have symptoms of withdrawal because your body is used to nicotine (the addictive substance in cigarettes). You may crave cigarettes, be irritable, feel very hungry, cough often, get headaches, or have difficulty concentrating. The withdrawal symptoms are only temporary. They are strongest when you first quit but will go away within 10-14 days. When withdrawal symptoms occur, stay in control. Think about your reasons for quitting. Remind yourself that these are signs that your body is healing and getting used to being without cigarettes. Remember that withdrawal symptoms are easier to treat than the major diseases that smoking can cause.  Even after the withdrawal is over, expect periodic urges to smoke. However, these cravings are generally short lived and will go away whether you smoke or not. Do not smoke! WHAT RESOURCES ARE AVAILABLE TO HELP ME QUIT SMOKING? Your health care provider can direct you to community resources or hospitals for support, which may include:  Group support.  Education.  Hypnosis.  Therapy.   This information is not intended to replace advice given to you by your health care provider. Make sure you discuss any questions you have with your health care provider.   Document Released: 06/20/2004 Document Revised: 10/13/2014 Document Reviewed: 03/10/2013 Elsevier Interactive Patient Education 2016 [employer] Inc[employer]  

## 2016-07-15 NOTE — Progress Notes (Signed)
[name]:([phone]) [phone]   Fax:([phone]) [place], MD [place] [address]  DIAGNOSIS: Stage IIIA (T2a N2 MX) non-small cell lung cancer, adenocarcinoma, diagnosed in October 2009.   PRIOR THERAPY: :  1. Status post right upper lobectomy with lymph node dissection under the care of Dr. Arlyce Dice on October 12, 2008. 2. Status post 4 cycles of adjuvant chemotherapy with cisplatin and docetaxel given every 3 weeks with Neulasta support. Last dose was given on January 23, 2009. 3. Status post adjuvant radiotherapy to the mediastinum. The patient received a total dose of 5040 cGy between March 06, 2009, through April 07, 2009, under the care of Dr. Pablo Ledger.  CURRENT THERAPY: Observation.   CHEMOTHERAPY INTENT: curative  CURRENT # OF CHEMOTHERAPY CYCLES: 0  CURRENT ANTIEMETICS: Compazine  CURRENT SMOKING STATUS: Current smoker. I strongly advise her to quit smoking and offered her smoke cessation program. She was also given him the phone number for the quick now program  ORAL CHEMOTHERAPY AND CONSENT: None  CURRENT BISPHOSPHONATES USE: None  PAIN MANAGEMENT: No Pain.  NARCOTICS INDUCED CONSTIPATION: N/A  LIVING WILL AND CODE STATUS: no CODE BLUE   INTERVAL HISTORY: [name] 70 y.o. female returns to the clinic today for six-month follow up visit accompanied by her daughter. The patient was last seen in January 2015. She was lost to follow-up since that time. She had several fours and fracture of her femur several months ago. She is feeling much better today except for the persistent fatigue and shortness breath. She is currently on home oxygen. Unfortunately she continues to smoke. She denied having any significant chest pain or hemoptysis. She has no nausea or vomiting. She is here today for reevaluation and further recommendation regarding her condition.  MEDICAL HISTORY: Past Medical History:    Diagnosis Date  . Asthma   . Cholelithiasis   . Chronic bronchitis ([facility])   . Colon polyps   . COPD (chronic obstructive pulmonary disease) ([facility])    USES O2 AT NIGHT + PRN IN DAYTIME3L  . Coronary artery disease    a. 2005 Cath/PCI: mRCA-> Cypher DES;  b. 06/2014 Cath/PCI: EF 60%. LM nl, LAD min irregs, D1 40, LCX 20p, OM2 95 (2.5x14 Resolute DES), RCA 30p, 30 ISR, 20d, PDA/PLA nl..  11/2015  Stenting of the entire RCA and LV branch off the RCA.    . Emphysema   . FH: chemotherapy 2010    had 4 times  . Fibromyalgia   . GERD (gastroesophageal reflux disease)   . H/O hiatal hernia   . Hyperlipidemia   . Hypertension   . Lung cancer ([facility]) 2010   Adenocarcinoma, right lung, node positive  . On home oxygen therapy    "2-4L at night and prn" (07/18/2104)  . Oxygen dependent    at night  3liters  . Peripheral vascular disease ([facility])   . Pneumonia    hx of   . Radiation 6/10   28 times right lung, and 5 treatments to sternum  . Stress incontinence   . Tobacco abuse   . Type II diabetes mellitus ([facility])     ALLERGIES:  is allergic to percocet [oxycodone-acetaminophen]; celebrex [celecoxib]; niacin; and varenicline tartrate.  MEDICATIONS:  Current Outpatient Prescriptions  Medication Sig Dispense Refill  . albuterol (PROVENTIL) (2.5 MG/3ML) 0.083% nebulizer solution Take 2.5 mg by nebulization every 6 (six) hours as needed for wheezing or shortness  of breath.    [name] aspirin EC 81 MG tablet Take 81 mg by mouth 2 (two) times daily.    . Cholecalciferol (VITAMIN D) 1000 UNITS capsule Take 1,000 Units by mouth daily.     . clopidogrel (PLAVIX) 75 MG tablet Take 1 tablet (75 mg total) by mouth daily with breakfast. 30 tablet 11  . empagliflozin (JARDIANCE) 25 MG TABS tablet Take 25 mg by mouth daily.    [name] esomeprazole (NEXIUM) 40 MG capsule Take 40 mg by mouth as needed (for heartburn/ acid reflux).     . fluticasone furoate-vilanterol (BREO ELLIPTA) 100-25 MCG/INH AEPB Inhale 1 puff into the  lungs daily.    . furosemide (LASIX) 20 MG tablet Take 1 tablet (20 mg total) by mouth daily as needed for edema. 20 tablet 0  . insulin aspart (NOVOLOG) 100 UNIT/ML injection Inject 1,015 Units into the skin as needed. Per sliding scale: CBG <200 15 units, 200-260 20 units, >260 25 units    . insulin detemir (LEVEMIR) 100 UNIT/ML injection Inject 45-55 Units into the skin 2 (two) times daily. Inject 55 units subcutaneously every morning and 45 units at bedtime    . lisinopril (PRINIVIL,ZESTRIL) 10 MG tablet Take 1 tablet (10 mg total) by mouth daily. 60 tablet 6  . lovastatin (MEVACOR) 40 MG tablet Take 40 mg by mouth daily after supper.    . metFORMIN (GLUCOPHAGE) 500 MG tablet Take 1 tablet (500 mg total) by mouth 2 (two) times daily with a meal.    . Polyethyl Glycol-Propyl Glycol (SYSTANE) 0.4-0.3 % SOLN Place 1 drop into both eyes 2 (two) times daily.     . pregabalin (LYRICA) 50 MG capsule Take 50 mg by mouth 2 (two) times daily.     [name] umeclidinium bromide (INCRUSE ELLIPTA) 62.5 MCG/INH AEPB Inhale 1 puff into the lungs daily.    [name] acetaminophen (TYLENOL) 500 MG tablet Take 1,000 mg by mouth every 6 (six) hours as needed for mild pain.     . metoprolol tartrate (LOPRESSOR) 25 MG tablet TAKE 1 TABLET BY MOUTH IN THE AM & 1/2 TAB BY MOUTH IN THE PM 135 tablet 3  . nitroGLYCERIN (NITROSTAT) 0.4 MG SL tablet Place 1 tablet (0.4 mg total) under the tongue every 5 (five) minutes x 3 doses as needed for chest pain. (Patient not taking: Reported on 07/15/2016) 25 tablet 2   No current facility-administered medications for this visit.     SURGICAL HISTORY:  Past Surgical History:  Procedure Laterality Date  . ABDOMINAL HYSTERECTOMY  1972  . ABDOMINAL HYSTERECTOMY    . APPENDECTOMY  1970  . arm fracture Left 3/14  . BACK SURGERY  2007  . CARDIAC CATHETERIZATION N/A 11/09/2015   Procedure: Left Heart Cath and Coronary Angiography;  Surgeon: Belva Crome, MD;  Location: [facility];   Service: Cardiovascular;  Laterality: N/A;  . CARDIAC CATHETERIZATION Right 11/09/2015   Procedure: Intravascular Pressure Wire/FFR Study;  Surgeon: Belva Crome, MD;  Location: [facility];  Service: Cardiovascular;  Laterality: Right;  . CARDIAC CATHETERIZATION N/A 11/09/2015   Procedure: Coronary Stent Intervention;  Surgeon: Belva Crome, MD;  Location: [facility];  Service: Cardiovascular;  Laterality: N/A;  RCA  . CARPAL TUNNEL RELEASE    . CORONARY ANGIOPLASTY  2005  . EYE SURGERY     implants 02/18/12 (Left) 02/25/12 (right eye)  . FEMUR FRACTURE SURGERY Left    10/15  . FEMUR IM NAIL  Left 07/19/2014   Procedure: INTRAMEDULLARY (IM) NAIL FEMORAL;  Surgeon: Alta Corning, MD;  Location: [facility];  Service: Orthopedics;  Laterality: Left;  . GALLBLADDER SURGERY  1989  . HARDWARE REMOVAL Left 07/19/2015   Procedure: HARDWARE REMOVAL;  Surgeon: Mcarthur Rossetti, MD;  Location: [facility];  Service: Orthopedics;  Laterality: Left;  . JOINT REPLACEMENT    . LEFT HEART CATHETERIZATION WITH CORONARY ANGIOGRAM N/A 06/20/2014   Procedure: LEFT HEART CATHETERIZATION WITH CORONARY ANGIOGRAM;  Surgeon: Troy Sine, MD;  Location: [facility];  Service: Cardiovascular;  Laterality: N/A;  . NECK SURGERY  2005  . ORIF HUMERUS FRACTURE Left 12/14/2012   Procedure: OPEN REDUCTION INTERNAL FIXATION (ORIF) LEFT PROXIMAL HUMERUS FRACTURE;  Surgeon: Mcarthur Rossetti, MD;  Location: [facility];  Service: Orthopedics;  Laterality: Left;  . PNEUMONECTOMY     Right Partial, lung cancer confirmed, nod positive  . PORT-A-CATH REMOVAL  05/12/2012   Procedure: REMOVAL PORT-A-CATH;  Surgeon: Melrose Nakayama, MD;  Location: [facility];  Service: Thoracic;  Laterality: Left;  . PORTACATH PLACEMENT  12/12/2007  . REFRACTIVE SURGERY Bilateral 2/14  . REPLACEMENT TOTAL KNEE  2001   left  . Right Upper Lobectomy with lymph node dissection  10/12/2008   Dr Arlyce Dice  . ROTATOR CUFF REPAIR     RIGHT SHOULDER   2 YRS  . TONSILLECTOMY  1966  . TOTAL KNEE REVISION Left 07/19/2015   Procedure: Removal Intramedullary Rod and Screws Left Knee/Femur, Revision arthroplasty Left knee;  Surgeon: Mcarthur Rossetti, MD;  Location: [facility];  Service: Orthopedics;  Laterality: Left;    REVIEW OF SYSTEMS:  A comprehensive review of systems was negative except for: Respiratory: positive for dyspnea on exertion   PHYSICAL EXAMINATION: General appearance: alert, cooperative and no distress Head: Normocephalic, without obvious abnormality, atraumatic Neck: no adenopathy Lymph nodes: Cervical, supraclavicular, and axillary nodes normal. Resp: clear to auscultation bilaterally Cardio: regular rate and rhythm, S1, S2 normal, no murmur, click, rub or gallop GI: soft, non-tender; bowel sounds normal; no masses,  no organomegaly Extremities: extremities normal, atraumatic, no cyanosis or edema  ECOG PERFORMANCE STATUS: 1 - Symptomatic but completely ambulatory  Blood pressure (!) 123/57, pulse 93, temperature 98 F (36.7 C), temperature source Oral, resp. rate 18, height '5\' 3"'$  (1.6 m), weight 205 lb 14.4 oz (93.4 kg), SpO2 93 %.  LABORATORY DATA: Lab Results  Component Value Date   WBC 10.4 06/19/2016   HGB 12.9 06/19/2016   HCT 42.6 06/19/2016   MCV 79.6 06/19/2016   PLT 147 (L) 06/19/2016      Chemistry      Component Value Date/Time   NA 140 06/19/2016 0642   NA 141 11/02/2013 0849   K 4.3 06/19/2016 0642   K 4.5 11/02/2013 0849   CL 107 06/19/2016 0642   CL 106 10/08/2012 0958   CO2 27 06/19/2016 0642   CO2 26 11/02/2013 0849   BUN 17 06/19/2016 0642   BUN 8.6 11/02/2013 0849   CREATININE 0.83 06/19/2016 0642   CREATININE 0.8 11/02/2013 0849      Component Value Date/Time   CALCIUM 8.9 06/19/2016 0642   CALCIUM 9.6 11/02/2013 0849   ALKPHOS 70 06/17/2016 1032   ALKPHOS 107 11/02/2013 0849   AST 16 06/17/2016 1032   AST 12 11/02/2013 0849   ALT 13 (L) 06/17/2016 1032   ALT 9  11/02/2013 0849   BILITOT 0.6 06/17/2016 1032   BILITOT 0.50 11/02/2013 0849  RADIOGRAPHIC STUDIES:  ASSESSMENT AND PLAN: this is a very pleasant 70 years old white female with history of stage IIIA non-small cell lung cancer status post right upper lobectomy followed by 4 cycles of adjuvant chemotherapy and adjuvant radiotherapy to the mediastinum. She has been observation since June of 2010 with no evidence for disease recurrence. I recommended for the patient to have repeat CT scan of the chest in the next few days for restaging of her disease. If there is no evidence for disease recurrence on the upcoming scan, I would see her back for follow-up visit in 6 months with repeat CT scan of the chest. I strongly encouraged the patient to quit smoking and offered her smoke cessation program. She was advised to call immediately she has any concerning symptoms in the interval.  The patient voices understanding of current disease status and treatment options and is in agreement with the current care plan.  All questions were answered. The patient knows to call the clinic with any problems, questions or concerns. We can certainly see the patient much sooner if necessary.  Disclaimer: This note was dictated with voice recognition software. Similar sounding words can inadvertently be transcribed and may not be corrected upon review.

## 2016-07-15 NOTE — Telephone Encounter (Signed)
GAVE PATIENT AVS REPORT AND APPOINTMENTS FOR April. [facility] WILL CALL RE SCAN FOR October AND April.

## 2016-07-16 DIAGNOSIS — I509 Heart failure, unspecified: Secondary | ICD-10-CM | POA: Diagnosis not present

## 2016-07-16 DIAGNOSIS — R5383 Other fatigue: Secondary | ICD-10-CM | POA: Diagnosis not present

## 2016-07-16 DIAGNOSIS — R531 Weakness: Secondary | ICD-10-CM | POA: Diagnosis not present

## 2016-07-18 ENCOUNTER — Ambulatory Visit (HOSPITAL_COMMUNITY)
Admission: RE | Admit: 2016-07-18 | Discharge: 2016-07-18 | Disposition: A | Discharge status: Home / Self Care | Payer: Medicare Other | Source: Ambulatory Visit | Attending: Internal Medicine | Admitting: Internal Medicine

## 2016-07-18 DIAGNOSIS — I2510 Atherosclerotic heart disease of native coronary artery without angina pectoris: Secondary | ICD-10-CM | POA: Insufficient documentation

## 2016-07-18 DIAGNOSIS — N200 Calculus of kidney: Secondary | ICD-10-CM | POA: Diagnosis not present

## 2016-07-18 DIAGNOSIS — C3491 Malignant neoplasm of unspecified part of right bronchus or lung: Secondary | ICD-10-CM | POA: Insufficient documentation

## 2016-07-18 DIAGNOSIS — I700 Atherosclerosis of aorta: Secondary | ICD-10-CM | POA: Insufficient documentation

## 2016-07-18 DIAGNOSIS — K229 Disease of esophagus, unspecified: Secondary | ICD-10-CM | POA: Insufficient documentation

## 2016-07-18 MED ORDER — IOPAMIDOL (ISOVUE-300) INJECTION 61%
75.0000 mL | Freq: Once | INTRAVENOUS | Status: AC | PRN
  Administered 2016-07-18: 75 mL via INTRAVENOUS

## 2016-07-21 ENCOUNTER — Telehealth: Payer: Self-pay | Admitting: *Deleted

## 2016-07-21 NOTE — Telephone Encounter (Signed)
She needs follow up this week. Scan does not look good.

## 2016-07-21 NOTE — Telephone Encounter (Signed)
Pt left message requesting CT results

## 2016-07-22 ENCOUNTER — Inpatient Hospital Stay (HOSPITAL_COMMUNITY): Payer: Medicare Other

## 2016-07-22 ENCOUNTER — Encounter (HOSPITAL_COMMUNITY): Payer: Self-pay | Admitting: Emergency Medicine

## 2016-07-22 ENCOUNTER — Inpatient Hospital Stay (HOSPITAL_COMMUNITY)
Admission: EM | Admit: 2016-07-22 | Discharge: 2016-07-25 | DRG: 193 | Disposition: A | Discharge status: Home Health | Payer: Medicare Other | Attending: Internal Medicine | Admitting: Internal Medicine

## 2016-07-22 ENCOUNTER — Emergency Department (HOSPITAL_COMMUNITY): Payer: Medicare Other

## 2016-07-22 DIAGNOSIS — I2510 Atherosclerotic heart disease of native coronary artery without angina pectoris: Secondary | ICD-10-CM | POA: Diagnosis not present

## 2016-07-22 DIAGNOSIS — R000 Tachycardia, unspecified: Secondary | ICD-10-CM | POA: Diagnosis present

## 2016-07-22 DIAGNOSIS — R140 Abdominal distension (gaseous): Secondary | ICD-10-CM | POA: Diagnosis not present

## 2016-07-22 DIAGNOSIS — E1165 Type 2 diabetes mellitus with hyperglycemia: Secondary | ICD-10-CM | POA: Diagnosis present

## 2016-07-22 DIAGNOSIS — R21 Rash and other nonspecific skin eruption: Secondary | ICD-10-CM | POA: Diagnosis present

## 2016-07-22 DIAGNOSIS — Z66 Do not resuscitate: Secondary | ICD-10-CM | POA: Diagnosis not present

## 2016-07-22 DIAGNOSIS — R0600 Dyspnea, unspecified: Secondary | ICD-10-CM | POA: Diagnosis not present

## 2016-07-22 DIAGNOSIS — I10 Essential (primary) hypertension: Secondary | ICD-10-CM | POA: Diagnosis present

## 2016-07-22 DIAGNOSIS — T40605A Adverse effect of unspecified narcotics, initial encounter: Secondary | ICD-10-CM | POA: Diagnosis present

## 2016-07-22 DIAGNOSIS — Z8249 Family history of ischemic heart disease and other diseases of the circulatory system: Secondary | ICD-10-CM

## 2016-07-22 DIAGNOSIS — J90 Pleural effusion, not elsewhere classified: Secondary | ICD-10-CM | POA: Diagnosis not present

## 2016-07-22 DIAGNOSIS — Z902 Acquired absence of lung [part of]: Secondary | ICD-10-CM | POA: Diagnosis not present

## 2016-07-22 DIAGNOSIS — Z794 Long term (current) use of insulin: Secondary | ICD-10-CM

## 2016-07-22 DIAGNOSIS — Z923 Personal history of irradiation: Secondary | ICD-10-CM | POA: Diagnosis not present

## 2016-07-22 DIAGNOSIS — Z79899 Other long term (current) drug therapy: Secondary | ICD-10-CM

## 2016-07-22 DIAGNOSIS — Z888 Allergy status to other drugs, medicaments and biological substances status: Secondary | ICD-10-CM

## 2016-07-22 DIAGNOSIS — Z86010 Personal history of colonic polyps: Secondary | ICD-10-CM

## 2016-07-22 DIAGNOSIS — F17210 Nicotine dependence, cigarettes, uncomplicated: Secondary | ICD-10-CM | POA: Diagnosis present

## 2016-07-22 DIAGNOSIS — Z7902 Long term (current) use of antithrombotics/antiplatelets: Secondary | ICD-10-CM

## 2016-07-22 DIAGNOSIS — K5903 Drug induced constipation: Secondary | ICD-10-CM | POA: Diagnosis not present

## 2016-07-22 DIAGNOSIS — F17200 Nicotine dependence, unspecified, uncomplicated: Secondary | ICD-10-CM

## 2016-07-22 DIAGNOSIS — Z90710 Acquired absence of both cervix and uterus: Secondary | ICD-10-CM

## 2016-07-22 DIAGNOSIS — E118 Type 2 diabetes mellitus with unspecified complications: Secondary | ICD-10-CM | POA: Diagnosis not present

## 2016-07-22 DIAGNOSIS — J910 Malignant pleural effusion: Secondary | ICD-10-CM | POA: Diagnosis not present

## 2016-07-22 DIAGNOSIS — J440 Chronic obstructive pulmonary disease with acute lower respiratory infection: Secondary | ICD-10-CM | POA: Diagnosis present

## 2016-07-22 DIAGNOSIS — J9621 Acute and chronic respiratory failure with hypoxia: Secondary | ICD-10-CM | POA: Diagnosis not present

## 2016-07-22 DIAGNOSIS — I639 Cerebral infarction, unspecified: Secondary | ICD-10-CM

## 2016-07-22 DIAGNOSIS — J189 Pneumonia, unspecified organism: Principal | ICD-10-CM | POA: Diagnosis present

## 2016-07-22 DIAGNOSIS — J9811 Atelectasis: Secondary | ICD-10-CM | POA: Diagnosis not present

## 2016-07-22 DIAGNOSIS — J9819 Other pulmonary collapse: Secondary | ICD-10-CM | POA: Diagnosis present

## 2016-07-22 DIAGNOSIS — I444 Left anterior fascicular block: Secondary | ICD-10-CM | POA: Diagnosis present

## 2016-07-22 DIAGNOSIS — Z6837 Body mass index (BMI) 37.0-37.9, adult: Secondary | ICD-10-CM

## 2016-07-22 DIAGNOSIS — R29810 Facial weakness: Secondary | ICD-10-CM | POA: Diagnosis not present

## 2016-07-22 DIAGNOSIS — D649 Anemia, unspecified: Secondary | ICD-10-CM | POA: Diagnosis not present

## 2016-07-22 DIAGNOSIS — J441 Chronic obstructive pulmonary disease with (acute) exacerbation: Secondary | ICD-10-CM | POA: Diagnosis present

## 2016-07-22 DIAGNOSIS — E875 Hyperkalemia: Secondary | ICD-10-CM | POA: Diagnosis not present

## 2016-07-22 DIAGNOSIS — R0602 Shortness of breath: Secondary | ICD-10-CM | POA: Diagnosis not present

## 2016-07-22 DIAGNOSIS — G459 Transient cerebral ischemic attack, unspecified: Secondary | ICD-10-CM | POA: Diagnosis not present

## 2016-07-22 DIAGNOSIS — J449 Chronic obstructive pulmonary disease, unspecified: Secondary | ICD-10-CM | POA: Diagnosis not present

## 2016-07-22 DIAGNOSIS — I6523 Occlusion and stenosis of bilateral carotid arteries: Secondary | ICD-10-CM | POA: Diagnosis not present

## 2016-07-22 DIAGNOSIS — R531 Weakness: Secondary | ICD-10-CM

## 2016-07-22 DIAGNOSIS — Z885 Allergy status to narcotic agent status: Secondary | ICD-10-CM

## 2016-07-22 DIAGNOSIS — E119 Type 2 diabetes mellitus without complications: Secondary | ICD-10-CM

## 2016-07-22 DIAGNOSIS — J9610 Chronic respiratory failure, unspecified whether with hypoxia or hypercapnia: Secondary | ICD-10-CM | POA: Insufficient documentation

## 2016-07-22 DIAGNOSIS — C3491 Malignant neoplasm of unspecified part of right bronchus or lung: Secondary | ICD-10-CM | POA: Diagnosis not present

## 2016-07-22 DIAGNOSIS — Z9981 Dependence on supplemental oxygen: Secondary | ICD-10-CM

## 2016-07-22 DIAGNOSIS — Z833 Family history of diabetes mellitus: Secondary | ICD-10-CM

## 2016-07-22 DIAGNOSIS — C3481 Malignant neoplasm of overlapping sites of right bronchus and lung: Secondary | ICD-10-CM | POA: Diagnosis not present

## 2016-07-22 DIAGNOSIS — Z955 Presence of coronary angioplasty implant and graft: Secondary | ICD-10-CM

## 2016-07-22 DIAGNOSIS — C3411 Malignant neoplasm of upper lobe, right bronchus or lung: Secondary | ICD-10-CM | POA: Diagnosis not present

## 2016-07-22 DIAGNOSIS — Z7982 Long term (current) use of aspirin: Secondary | ICD-10-CM

## 2016-07-22 DIAGNOSIS — Z9221 Personal history of antineoplastic chemotherapy: Secondary | ICD-10-CM

## 2016-07-22 DIAGNOSIS — Z825 Family history of asthma and other chronic lower respiratory diseases: Secondary | ICD-10-CM

## 2016-07-22 LAB — CBC WITH DIFFERENTIAL/PLATELET
BASOS ABS: 0 10*3/uL (ref 0.0–0.1)
BASOS PCT: 0 %
EOS PCT: 2 %
Eosinophils Absolute: 0.1 10*3/uL (ref 0.0–0.7)
HEMATOCRIT: 37 % (ref 36.0–46.0)
Hemoglobin: 11 g/dL — ABNORMAL LOW (ref 12.0–15.0)
Lymphocytes Relative: 27 %
Lymphs Abs: 1.3 10*3/uL (ref 0.7–4.0)
MCH: 24.8 pg — ABNORMAL LOW (ref 26.0–34.0)
MCHC: 29.7 g/dL — AB (ref 30.0–36.0)
MCV: 83.3 fL (ref 78.0–100.0)
MONO ABS: 0.4 10*3/uL (ref 0.1–1.0)
MONOS PCT: 8 %
NEUTROS ABS: 3.2 10*3/uL (ref 1.7–7.7)
Neutrophils Relative %: 63 %
PLATELETS: 164 10*3/uL (ref 150–400)
RBC: 4.44 MIL/uL (ref 3.87–5.11)
RDW: 18.1 % — AB (ref 11.5–15.5)
WBC: 5 10*3/uL (ref 4.0–10.5)

## 2016-07-22 LAB — GLUCOSE, CAPILLARY
GLUCOSE-CAPILLARY: 131 mg/dL — AB (ref 65–99)
Glucose-Capillary: 196 mg/dL — ABNORMAL HIGH (ref 65–99)

## 2016-07-22 LAB — COMPREHENSIVE METABOLIC PANEL
ALBUMIN: 3.4 g/dL — AB (ref 3.5–5.0)
ALT: 13 U/L — ABNORMAL LOW (ref 14–54)
ANION GAP: 8 (ref 5–15)
AST: 18 U/L (ref 15–41)
Alkaline Phosphatase: 58 U/L (ref 38–126)
BILIRUBIN TOTAL: 0.6 mg/dL (ref 0.3–1.2)
BUN: 16 mg/dL (ref 6–20)
CHLORIDE: 103 mmol/L (ref 101–111)
CO2: 28 mmol/L (ref 22–32)
Calcium: 8.8 mg/dL — ABNORMAL LOW (ref 8.9–10.3)
Creatinine, Ser: 0.77 mg/dL (ref 0.44–1.00)
GFR calc Af Amer: >60 mL/min (ref >60)
GLUCOSE: 145 mg/dL — AB (ref 65–99)
POTASSIUM: 3.8 mmol/L (ref 3.5–5.1)
Sodium: 139 mmol/L (ref 135–145)
TOTAL PROTEIN: 6.4 g/dL — AB (ref 6.5–8.1)

## 2016-07-22 LAB — TROPONIN I

## 2016-07-22 LAB — MRSA PCR SCREENING: MRSA BY PCR: NEGATIVE

## 2016-07-22 LAB — CBG MONITORING, ED
Glucose-Capillary: 145 mg/dL — ABNORMAL HIGH (ref 65–99)
Glucose-Capillary: 119 mg/dL — ABNORMAL HIGH (ref 65–99)

## 2016-07-22 MED ORDER — PRAVASTATIN SODIUM 40 MG PO TABS
40.0000 mg | ORAL_TABLET | Freq: Every day | ORAL | Status: DC
  Administered 2016-07-22 – 2016-07-24 (×3): 40 mg via ORAL
  Filled 2016-07-22 (×3): qty 1
  Filled 2016-07-22: qty 2

## 2016-07-22 MED ORDER — ASPIRIN EC 81 MG PO TBEC
81.0000 mg | DELAYED_RELEASE_TABLET | Freq: Every day | ORAL | Status: DC
  Administered 2016-07-22 – 2016-07-25 (×4): 81 mg via ORAL
  Filled 2016-07-22 (×5): qty 1

## 2016-07-22 MED ORDER — METOPROLOL TARTRATE 25 MG PO TABS
25.0000 mg | ORAL_TABLET | Freq: Every day | ORAL | Status: DC
  Administered 2016-07-22: 25 mg via ORAL
  Filled 2016-07-22: qty 1

## 2016-07-22 MED ORDER — METOPROLOL TARTRATE 12.5 MG HALF TABLET
12.5000 mg | ORAL_TABLET | Freq: Every day | ORAL | Status: DC
  Administered 2016-07-22: 12.5 mg via ORAL
  Filled 2016-07-22: qty 1

## 2016-07-22 MED ORDER — LEVALBUTEROL HCL 0.63 MG/3ML IN NEBU
0.6300 mg | INHALATION_SOLUTION | Freq: Four times a day (QID) | RESPIRATORY_TRACT | Status: DC
  Administered 2016-07-22: 0.63 mg via RESPIRATORY_TRACT
  Filled 2016-07-22: qty 3

## 2016-07-22 MED ORDER — METHYLPREDNISOLONE SODIUM SUCC 40 MG IJ SOLR
40.0000 mg | Freq: Three times a day (TID) | INTRAMUSCULAR | Status: DC
  Administered 2016-07-22 – 2016-07-24 (×6): 40 mg via INTRAVENOUS
  Filled 2016-07-22 (×6): qty 1

## 2016-07-22 MED ORDER — ONDANSETRON HCL 4 MG PO TABS: 4.0000 mg | ORAL_TABLET | Freq: Four times a day (QID) | ORAL | Status: DC | PRN

## 2016-07-22 MED ORDER — PANTOPRAZOLE SODIUM 40 MG PO TBEC
80.0000 mg | DELAYED_RELEASE_TABLET | Freq: Every day | ORAL | Status: DC
  Administered 2016-07-22 – 2016-07-25 (×4): 80 mg via ORAL
  Filled 2016-07-22 (×4): qty 2

## 2016-07-22 MED ORDER — VANCOMYCIN HCL IN DEXTROSE 1-5 GM/200ML-% IV SOLN
1000.0000 mg | Freq: Once | INTRAVENOUS | Status: AC
  Administered 2016-07-22: 1000 mg via INTRAVENOUS
  Filled 2016-07-22: qty 200

## 2016-07-22 MED ORDER — INSULIN DETEMIR 100 UNIT/ML ~~LOC~~ SOLN: 20.0000 [IU] | Freq: Two times a day (BID) | SUBCUTANEOUS | Status: DC

## 2016-07-22 MED ORDER — INSULIN DETEMIR 100 UNIT/ML ~~LOC~~ SOLN: 40.0000 [IU] | Freq: Two times a day (BID) | SUBCUTANEOUS | Status: DC

## 2016-07-22 MED ORDER — ACETAMINOPHEN 325 MG PO TABS: 650.0000 mg | ORAL_TABLET | Freq: Four times a day (QID) | ORAL | Status: DC | PRN

## 2016-07-22 MED ORDER — VANCOMYCIN HCL IN DEXTROSE 1-5 GM/200ML-% IV SOLN
1000.0000 mg | Freq: Two times a day (BID) | INTRAVENOUS | Status: DC
  Administered 2016-07-22 – 2016-07-23 (×2): 1000 mg via INTRAVENOUS
  Filled 2016-07-22 (×2): qty 200

## 2016-07-22 MED ORDER — NITROGLYCERIN 0.4 MG SL SUBL: 0.4000 mg | SUBLINGUAL_TABLET | SUBLINGUAL | Status: DC | PRN

## 2016-07-22 MED ORDER — POTASSIUM CHLORIDE IN NACL 20-0.9 MEQ/L-% IV SOLN
INTRAVENOUS | Status: DC
  Administered 2016-07-22: 19:00:00 via INTRAVENOUS
  Filled 2016-07-22: qty 1000

## 2016-07-22 MED ORDER — LEVALBUTEROL HCL 0.63 MG/3ML IN NEBU
0.6300 mg | INHALATION_SOLUTION | Freq: Three times a day (TID) | RESPIRATORY_TRACT | Status: DC
  Administered 2016-07-23 – 2016-07-25 (×6): 0.63 mg via RESPIRATORY_TRACT
  Filled 2016-07-22 (×8): qty 3

## 2016-07-22 MED ORDER — INSULIN ASPART 100 UNIT/ML ~~LOC~~ SOLN
0.0000 [IU] | Freq: Every day | SUBCUTANEOUS | Status: DC
  Administered 2016-07-23: 3 [IU] via SUBCUTANEOUS

## 2016-07-22 MED ORDER — SENNA 8.6 MG PO TABS
1.0000 | ORAL_TABLET | Freq: Two times a day (BID) | ORAL | Status: DC
  Administered 2016-07-22: 8.6 mg via ORAL
  Filled 2016-07-22: qty 1

## 2016-07-22 MED ORDER — POLYVINYL ALCOHOL 1.4 % OP SOLN
1.0000 [drp] | Freq: Two times a day (BID) | OPHTHALMIC | Status: DC
  Administered 2016-07-22 – 2016-07-25 (×6): 1 [drp] via OPHTHALMIC
  Filled 2016-07-22: qty 15

## 2016-07-22 MED ORDER — IOPAMIDOL (ISOVUE-370) INJECTION 76%
100.0000 mL | Freq: Once | INTRAVENOUS | Status: AC | PRN
  Administered 2016-07-22: 100 mL via INTRAVENOUS

## 2016-07-22 MED ORDER — PIPERACILLIN-TAZOBACTAM 3.375 G IVPB
3.3750 g | Freq: Three times a day (TID) | INTRAVENOUS | Status: DC
  Administered 2016-07-22 – 2016-07-25 (×8): 3.375 g via INTRAVENOUS
  Filled 2016-07-22 (×7): qty 50

## 2016-07-22 MED ORDER — ACETAMINOPHEN 650 MG RE SUPP: 650.0000 mg | Freq: Four times a day (QID) | RECTAL | Status: DC | PRN

## 2016-07-22 MED ORDER — LISINOPRIL 10 MG PO TABS
10.0000 mg | ORAL_TABLET | Freq: Every day | ORAL | Status: DC
  Administered 2016-07-22: 10 mg via ORAL
  Filled 2016-07-22: qty 1

## 2016-07-22 MED ORDER — NICOTINE 21 MG/24HR TD PT24
21.0000 mg | MEDICATED_PATCH | Freq: Every day | TRANSDERMAL | Status: DC
  Administered 2016-07-22 – 2016-07-25 (×4): 21 mg via TRANSDERMAL
  Filled 2016-07-22 (×4): qty 1

## 2016-07-22 MED ORDER — INSULIN ASPART 100 UNIT/ML ~~LOC~~ SOLN
0.0000 [IU] | Freq: Three times a day (TID) | SUBCUTANEOUS | Status: DC
  Administered 2016-07-23: 2 [IU] via SUBCUTANEOUS
  Administered 2016-07-23: 15 [IU] via SUBCUTANEOUS
  Administered 2016-07-23: 11 [IU] via SUBCUTANEOUS
  Administered 2016-07-24 (×3): 5 [IU] via SUBCUTANEOUS
  Administered 2016-07-25: 8 [IU] via SUBCUTANEOUS
  Administered 2016-07-25: 3 [IU] via SUBCUTANEOUS

## 2016-07-22 MED ORDER — PIPERACILLIN-TAZOBACTAM 3.375 G IVPB 30 MIN
3.3750 g | Freq: Once | INTRAVENOUS | Status: AC
  Administered 2016-07-22: 3.375 g via INTRAVENOUS
  Filled 2016-07-22: qty 50

## 2016-07-22 MED ORDER — CLOPIDOGREL BISULFATE 75 MG PO TABS
75.0000 mg | ORAL_TABLET | Freq: Every day | ORAL | Status: DC
  Administered 2016-07-23 – 2016-07-25 (×3): 75 mg via ORAL
  Filled 2016-07-22 (×3): qty 1

## 2016-07-22 MED ORDER — HYDROCODONE-ACETAMINOPHEN 5-325 MG PO TABS
1.0000 | ORAL_TABLET | ORAL | Status: DC | PRN
  Administered 2016-07-22: 1 via ORAL
  Administered 2016-07-23: 2 via ORAL
  Filled 2016-07-22: qty 2
  Filled 2016-07-22: qty 1

## 2016-07-22 MED ORDER — PREGABALIN 50 MG PO CAPS
50.0000 mg | ORAL_CAPSULE | Freq: Two times a day (BID) | ORAL | Status: DC
  Administered 2016-07-22 – 2016-07-25 (×6): 50 mg via ORAL
  Filled 2016-07-22 (×6): qty 1

## 2016-07-22 MED ORDER — LEVALBUTEROL HCL 0.63 MG/3ML IN NEBU
0.6300 mg | INHALATION_SOLUTION | Freq: Once | RESPIRATORY_TRACT | Status: AC
  Administered 2016-07-22: 0.63 mg via RESPIRATORY_TRACT
  Filled 2016-07-22: qty 3

## 2016-07-22 MED ORDER — FLUTICASONE FUROATE-VILANTEROL 100-25 MCG/INH IN AEPB
1.0000 | INHALATION_SPRAY | Freq: Every day | RESPIRATORY_TRACT | Status: DC
  Administered 2016-07-23 – 2016-07-25 (×3): 1 via RESPIRATORY_TRACT
  Filled 2016-07-22: qty 28

## 2016-07-22 MED ORDER — INSULIN DETEMIR 100 UNIT/ML ~~LOC~~ SOLN
15.0000 [IU] | Freq: Two times a day (BID) | SUBCUTANEOUS | Status: DC
  Administered 2016-07-22 – 2016-07-23 (×2): 15 [IU] via SUBCUTANEOUS
  Filled 2016-07-22 (×2): qty 0.15

## 2016-07-22 MED ORDER — UMECLIDINIUM BROMIDE 62.5 MCG/INH IN AEPB
1.0000 | INHALATION_SPRAY | Freq: Every day | RESPIRATORY_TRACT | Status: DC
  Administered 2016-07-23 – 2016-07-25 (×3): 1 via RESPIRATORY_TRACT
  Filled 2016-07-22: qty 7

## 2016-07-22 MED ORDER — ONDANSETRON HCL 4 MG/2ML IJ SOLN
4.0000 mg | Freq: Four times a day (QID) | INTRAMUSCULAR | Status: DC | PRN
  Administered 2016-07-22: 4 mg via INTRAVENOUS
  Filled 2016-07-22: qty 2

## 2016-07-22 MED ORDER — FUROSEMIDE 20 MG PO TABS: 20.0000 mg | ORAL_TABLET | Freq: Every day | ORAL | Status: DC | PRN

## 2016-07-22 MED ORDER — ENOXAPARIN SODIUM 40 MG/0.4ML ~~LOC~~ SOLN
40.0000 mg | Freq: Every day | SUBCUTANEOUS | Status: DC
  Administered 2016-07-22 – 2016-07-24 (×3): 40 mg via SUBCUTANEOUS
  Filled 2016-07-22 (×3): qty 0.4

## 2016-07-22 NOTE — ED Provider Notes (Signed)
[facility] DEPT Provider Note   CSN: [id number] Arrival date & time: 07/22/16  1024  By signing my name below, I, Rayna Sexton, attest that this documentation has been prepared under the direction and in the presence of Jola Schmidt, MD. Electronically Signed: Rayna Sexton, ED Scribe. 07/22/16. 11:33 AM.   History   Chief Complaint Chief Complaint  Patient presents with  . Shortness of Breath    HPI HPI Comments: [name] is a 70 y.o. female with a h/o bronchitis, COPD, emphysema, right upper lobectomy and adenocarcinoma of the right lung who presents to the Emergency Department complaining of worsening SOB x 1 month. Pt was seen by her PCP this morning and sent to the ED for further evaluation regarding fluid in her right lung. Pt states she was in remission from CA for eight years and had a CT four days ago due to 1 month of SOB and was told during her appointment this morning that there is new concern for lung CA. She reports associated intermittent leg swelling, right sided CP and cough. Her SOB worsens with exertion. She is on home O2 at 3L/min. Pt denies fever.   The history is provided by the patient and a relative. No language interpreter was used.    Past Medical History:  Diagnosis Date  . Adenocarcinoma of right lung, stage 3 ([place]) 08/28/2009      . Asthma   . Cholelithiasis   . Chronic bronchitis ([place])   . Colon polyps   . COPD (chronic obstructive pulmonary disease) (H[facility])    USES O2 AT NIGHT + PRN IN DAYTIME3L  . Coronary artery disease    a. 2005 Cath/PCI: mRCA-> [place]her DES;  b. 06/2014 Cath/PCI: EF 60%. LM nl, LAD min irregs, D1 40, LCX 20p, OM2 95 (2.5x14 Resolute DES), RCA 30p, 30 ISR, 20d, PDA/PLA nl..  11/2015  Stenting of the entire RCA and LV branch off the RCA.    . Emphysema   . FH: chemotherapy 2010    had 4 times  . Fibromyalgia   . GERD (gastroesophageal reflux disease)   . H/O hiatal hernia   . Hyperlipidemia   . Hypertension   . Lung  cancer ([place]) 2010   Adenocarcinoma, right lung, node positive  . On home oxygen therapy    "2-4L at night and prn" (07/18/2104)  . Oxygen dependent    at night  3liters  . Peripheral vascular disease ([place])   . Pneumonia    hx of   . Radiation 6/10   28 times right lung, and 5 treatments to sternum  . Stress incontinence   . Tobacco abuse   . Type II diabetes mellitus [facility])     Patient Active Problem List   Diagnosis Date Noted  . CAP (community acquired pneumonia) 06/17/2016  . Acute on chronic respiratory failure with hypoxia ([place]) 06/17/2016  . Morbid obesity ([place]) 11/10/2015  . Coronary artery disease involving native coronary artery of native heart with unstable angina pectoris ([place])   . 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 Coronary artery disease   . GERD (gastroesophageal reflux disease)   . Hyperlipidemia   . COPD exacerbation ([place])   . Hypertension   . Tobacco abuse 07/29/2010  . Adenocarcinoma of right lung, stage 3 ([place]) 08/28/2009  . RADIATION PNEUMONITIS 08/28/2009  . Type II diabetes mellitus ([place]) 07/30/2008  . Hyperlipidemia LDL goal <70 07/30/2008  . Essential hypertension 07/28/2008  . OXYGEN-USE OF SUPPLEMENTAL 07/28/2008  Past Surgical History:  Procedure Laterality Date  . ABDOMINAL HYSTERECTOMY  1972  . ABDOMINAL HYSTERECTOMY    . APPENDECTOMY  1970  . arm fracture Left 3/14  . BACK SURGERY  2007  . CARDIAC CATHETERIZATION N/A 11/09/2015   Procedure: Left Heart Cath and Coronary Angiography;  Surgeon: Belva Crome, MD;  Location: [facility] CV LAB;  Service: Cardiovascular;  Laterality: N/A;  . CARDIAC CATHETERIZATION Right 11/09/2015   Procedure: Intravascular Pressure Wire/FFR Study;  Surgeon: Belva Crome, MD;  Location: [facility] CV LAB;  Service: Cardiovascular;  Laterality: Right;  . CARDIAC CATHETERIZATION N/A 11/09/2015   Procedure: Coronary Stent Intervention;  Surgeon: Belva Crome, MD;  Location: [facility] CV  LAB;  Service: Cardiovascular;  Laterality: N/A;  RCA  . CARPAL TUNNEL RELEASE    . CORONARY ANGIOPLASTY  2005  . EYE SURGERY     implants 02/18/12 (Left) 02/25/12 (right eye)  . FEMUR FRACTURE SURGERY Left    10/15  . FEMUR IM NAIL Left 07/19/2014   Procedure: INTRAMEDULLARY (IM) NAIL FEMORAL;  Surgeon: Alta Corning, MD;  Location: [facility];  Service: Orthopedics;  Laterality: Left;  . GALLBLADDER SURGERY  1989  . HARDWARE REMOVAL Left 07/19/2015   Procedure: HARDWARE REMOVAL;  Surgeon: Mcarthur Rossetti, MD;  Location: [facility];  Service: Orthopedics;  Laterality: Left;  . JOINT REPLACEMENT    . LEFT HEART CATHETERIZATION WITH CORONARY ANGIOGRAM N/A 06/20/2014   Procedure: LEFT HEART CATHETERIZATION WITH CORONARY ANGIOGRAM;  Surgeon: Troy Sine, MD;  Location: [facility] CATH LAB;  Service: Cardiovascular;  Laterality: N/A;  . NECK SURGERY  2005  . ORIF HUMERUS FRACTURE Left 12/14/2012   Procedure: OPEN REDUCTION INTERNAL FIXATION (ORIF) LEFT PROXIMAL HUMERUS FRACTURE;  Surgeon: Mcarthur Rossetti, MD;  Location: [facility];  Service: Orthopedics;  Laterality: Left;  . PNEUMONECTOMY     Right Partial, lung cancer confirmed, nod positive  . PORT-A-CATH REMOVAL  05/12/2012   Procedure: REMOVAL PORT-A-CATH;  Surgeon: Melrose Nakayama, MD;  Location: [facility];  Service: Thoracic;  Laterality: Left;  . PORTACATH PLACEMENT  12/12/2007  . REFRACTIVE SURGERY Bilateral 2/14  . REPLACEMENT TOTAL KNEE  2001   left  . Right Upper Lobectomy with lymph node dissection  10/12/2008   Dr Arlyce Dice  . ROTATOR CUFF REPAIR     RIGHT SHOULDER  2 YRS  . TONSILLECTOMY  1966  . TOTAL KNEE REVISION Left 07/19/2015   Procedure: Removal Intramedullary Rod and Screws Left Knee/Femur, Revision arthroplasty Left knee;  Surgeon: Mcarthur Rossetti, MD;  Location: [facility];  Service: Orthopedics;  Laterality: Left;    OB History    No data available       Home Medications    Prior to Admission medications     Medication Sig Start Date End Date Taking? Authorizing Provider  acetaminophen (TYLENOL) 500 MG tablet Take 1,000 mg by mouth every 6 (six) hours as needed for mild pain.     Historical Provider, MD  albuterol (PROVENTIL) (2.5 MG/3ML) 0.083% nebulizer solution Take 2.5 mg by nebulization every 6 (six) hours as needed for wheezing or shortness of breath.    Historical Provider, MD  aspirin EC 81 MG tablet Take 81 mg by mouth 2 (two) times daily.    Historical Provider, MD  Cholecalciferol (VITAMIN D) 1000 UNITS capsule Take 1,000 Units by mouth daily.     Historical Provider, MD  clopidogrel (PLAVIX) 75 MG tablet Take 1 tablet (75 mg total)  by mouth daily with breakfast. 11/10/15   Brett Canales, PA-C  empagliflozin (JARDIANCE) 25 MG TABS tablet Take 25 mg by mouth daily.    Historical Provider, MD  esomeprazole (NEXIUM) 40 MG capsule Take 40 mg by mouth as needed (for heartburn/ acid reflux).     Historical Provider, MD  fluticasone furoate-vilanterol (BREO ELLIPTA) 100-25 MCG/INH AEPB Inhale 1 puff into the lungs daily.    Historical Provider, MD  furosemide (LASIX) 20 MG tablet Take 1 tablet (20 mg total) by mouth daily as needed for edema. 06/20/16   Kelvin Cellar, MD  insulin aspart (NOVOLOG) 100 UNIT/ML injection Inject 1,015 Units into the skin as needed. Per sliding scale: CBG <200 15 units, 200-260 20 units, >260 25 units    Historical Provider, MD  insulin detemir (LEVEMIR) 100 UNIT/ML injection Inject 45-55 Units into the skin 2 (two) times daily. Inject 55 units subcutaneously every morning and 45 units at bedtime    Historical Provider, MD  lisinopril (PRINIVIL,ZESTRIL) 10 MG tablet Take 1 tablet (10 mg total) by mouth daily. 03/11/16   Minus Breeding, MD  lovastatin (MEVACOR) 40 MG tablet Take 40 mg by mouth daily after supper.    Historical Provider, MD  metFORMIN (GLUCOPHAGE) 500 MG tablet Take 1 tablet (500 mg total) by mouth 2 (two) times daily with a meal. 11/10/15   Brett Canales, PA-C   metoprolol tartrate (LOPRESSOR) 25 MG tablet TAKE 1 TABLET BY MOUTH IN THE AM & 1/2 TAB BY MOUTH IN THE PM 05/02/16   Minus Breeding, MD  nitroGLYCERIN (NITROSTAT) 0.4 MG SL tablet Place 1 tablet (0.4 mg total) under the tongue every 5 (five) minutes x 3 doses as needed for chest pain. Patient not taking: Reported on 07/15/2016 06/21/14   Erlene Quan, PA-C  Polyethyl Glycol-Propyl Glycol (SYSTANE) 0.4-0.3 % SOLN Place 1 drop into both eyes 2 (two) times daily.     Historical Provider, MD  pregabalin (LYRICA) 50 MG capsule Take 50 mg by mouth 2 (two) times daily.     Historical Provider, MD  umeclidinium bromide (INCRUSE ELLIPTA) 62.5 MCG/INH AEPB Inhale 1 puff into the lungs daily.    Historical Provider, MD    Family History Family History  Problem Relation Age of Onset  . Diabetes Mother   . Heart failure Mother   . Diabetes Father   . CAD Father 83  . Arthritis    . Lung disease    . Cancer    . Asthma      Social History Social History  Substance Use Topics  . Smoking status: Current Every Day Smoker    Packs/day: 1.00    Years: 53.00    Types: Cigarettes  . Smokeless tobacco: Never Used  . Alcohol use No     Allergies   Percocet [oxycodone-acetaminophen]; Celebrex [celecoxib]; Niacin; and Varenicline tartrate   Review of Systems Review of Systems  Constitutional: Positive for fatigue. Negative for chills and fever.  Respiratory: Positive for cough and shortness of breath.   Cardiovascular: Positive for chest pain and leg swelling.  All other systems reviewed and are negative.  Physical Exam Updated Vital Signs BP 114/66   Pulse 109   Temp 98.1 F (36.7 C)   Resp 25   Ht '5\' 2"'$  (1.575 m)   Wt 205 lb (93 kg)   SpO2 93%   BMI 37.49 kg/m   Physical Exam  Constitutional: She is oriented to person, place, and time. She appears well-developed  and well-nourished. No distress.  HENT:  Head: Normocephalic and atraumatic.  Eyes: EOM are normal.  Neck: Normal  range of motion.  Cardiovascular: Regular rhythm and normal heart sounds.  Tachycardia present.   Pulmonary/Chest: She is in respiratory distress.  Decrease breath sounds noted on the right.   Abdominal: Soft. She exhibits no distension. There is no tenderness.  Musculoskeletal: Normal range of motion.  Neurological: She is alert and oriented to person, place, and time.  Skin: Skin is warm and dry.  Psychiatric: She has a normal mood and affect. Judgment normal.  Nursing note and vitals reviewed.  ED Treatments / Results  Labs (all labs ordered are listed, but only abnormal results are displayed) Labs Reviewed  CBC WITH DIFFERENTIAL/PLATELET - Abnormal; Notable for the following:       Result Value   Hemoglobin 11.0 (*)    MCH 24.8 (*)    MCHC 29.7 (*)    RDW 18.1 (*)    All other components within normal limits  COMPREHENSIVE METABOLIC PANEL - Abnormal; Notable for the following:    Glucose, Bld 145 (*)    Calcium 8.8 (*)    Total Protein 6.4 (*)    Albumin 3.4 (*)    ALT 13 (*)    All other components within normal limits  TROPONIN I    EKG  EKG Interpretation  Date/Time:  Tuesday July 22 2016 10:35:39 EDT Ventricular Rate:  111 PR Interval:    QRS Duration: 78 QT Interval:  329 QTC Calculation: 447 R Axis:   -47 Text Interpretation:  Sinus tachycardia Left anterior fascicular block Anterior infarct, old Baseline wander in lead(s) III No significant change was found Confirmed by Finlee Milo  MD, Lennette Bihari ([id number]) on 07/22/2016 12:27:12 PM       Radiology Dg Chest 2 View  Result Date: 07/22/2016 CLINICAL DATA:  Increased shortness of breath for 1 week. EXAM: CHEST  2 VIEW COMPARISON:  06/27/2016 and CT from 07/18/2016 FINDINGS: The heart size appears normal. There is near complete opacification of the entire right hemi thorax with a small focus of aerated lung in the right upper lung zone. There is subsegmental atelectasis within the left base. When compared with the  previous chest radiograph there has been significantly diminished aeration to the entire right hemi thorax. Today's imaging findings correlate with CT appearance of the right lung from 07/18/2016. IMPRESSION: 1. Markedly diminished aeration to the right lung when compared with 06/27/2016. Findings most likely reflect progression of lung cancer. Electronically Signed   By: Kerby Moors M.D.   On: 07/22/2016 12:05    CLINICAL DATA:  Right lung cancer.  EXAM: CT CHEST WITH CONTRAST  TECHNIQUE: Multidetector CT imaging of the chest was performed during intravenous contrast administration.  CONTRAST:  65m ISOVUE-300 IOPAMIDOL (ISOVUE-300) INJECTION 61%  COMPARISON:  11/08/2015.  FINDINGS: Cardiovascular: Atherosclerotic calcification of the arterial vasculature, including three-vessel involvement of the coronary arteries. Heart size normal. No pericardial effusion.  Mediastinum/Nodes: Mediastinal lymph nodes measure up to 1.7 cm in the low right paratracheal station and AP window, previously 5 mm. Subcarinal lymph node measures 1.7 cm, previously 6 mm. No left hilar adenopathy. Right hilum is difficult to assess due to obscuration by extensive consolidation. No axillary adenopathy. Mid/distal esophagus appears thickened, similar.  Lungs/Pleura: Postoperative changes of right upper lobectomy with collapse/consolidation involving nearly the entire right lung. There is an area of low attenuation contained within the consolidation, measuring 2.2 cm (image 35). Obscuration of previously seen radiation  therapy changes in the medial right hemi thorax. Small partially loculated right pleural effusion, new.  Upper Abdomen: Visualized portions of the liver, adrenal glands, right kidney unremarkable. Tiny left renal stone. Visualized portions of the spleen, pancreas, stomach unremarkable. There are enlarged gastrohepatic ligament and retroperitoneal lymph nodes. Index gastrohepatic  ligament lymph node measures 1.3 cm and index aortocaval lymph node measures 1.3 cm. Juxta diaphragmatic lymph nodes measure up to 8 mm, new.  Musculoskeletal: No worrisome lytic or sclerotic lesions. Postoperative changes in the left humerus and lower cervical spine. Degenerative changes are seen in the spine.  IMPRESSION: 1. Progression of metastatic lung cancer as evidenced by extensive collapse/consolidation throughout the right lung, a small loculated right pleural effusion and new/enlarging mediastinal/upper abdominal lymph nodes. 2. Aortic atherosclerosis (ICD10-170.0). Coronary artery calcification. 3. Mid/distal esophageal wall thickening, similar. 4. Tiny left renal stone.  Procedures Procedures  DIAGNOSTIC STUDIES: Oxygen Saturation is 93% on [place] 3L/min, adequate by my interpretation.    COORDINATION OF CARE: 11:31 AM Discussed next steps with pt. Pt verbalized understanding and is agreeable with the plan.    Medications Ordered in ED Medications - No data to display   Initial Impression / Assessment and Plan / ED Course  I have reviewed the triage vital signs and the nursing notes.  Pertinent labs & imaging results that were available during my care of the patient were reviewed by me and considered in my medical decision making (see chart for details).  Clinical Course    Worsening shortness of breath with worsening consolidation loculated effusion of her right lung.  She'll need to be admitted the hospital.  This is likely a combination of worsening cancer, worsening loculated pleural effusion, possible worsening pneumonia.  Patient was broadened to vancomycin Zosyn.  She prefers to be admitted at [facility] where her oncology team is including Dr. Earlie Server.  Outpatient CT scan performed several days ago demonstrates worsening cancer.  I long discussion with the patient regarding her prognosis and she knows that her prognosis is potentially poor.  At this  time she does not want to be placed on the ventilator and is a DO NOT RESUSCITATE otherwise she would like aggressive care.  This was stated in front of her daughter.   I personally performed the services described in this documentation, which was scribed in my presence. The recorded information has been reviewed and is accurate.      Final Clinical Impressions(s) / ED Diagnoses   Final diagnoses:  Dyspnea, unspecified type  Malignant neoplasm of right lung, unspecified part of lung  [facility])    New Prescriptions New Prescriptions   No medications on file     Jola Schmidt, MD 07/22/16 1230

## 2016-07-22 NOTE — ED Notes (Signed)
Bed: JO83 Expected date:  Expected time:  Means of arrival:  Comments: Pt in room

## 2016-07-22 NOTE — Progress Notes (Signed)
Pharmacy Antibiotic Note  [name] is a 70 y.o. female admitted on 07/22/2016 with loculated pleural effusion.  Pharmacy has been consulted for Vancomycin and Zosyn dosing.  Plan:  Vancomycin '1000mg'$  IV q12h Check trough at steady state Zosyn 3.375gm IV q8h, EID Monitor labs, renal fxn, progress and c/s Deescalate ABX when improved / appropriate.    Height: '5\' 2"'$  (157.5 cm) Weight: 205 lb (93 kg) IBW/kg (Calculated) : 50.1  Temp (24hrs), Avg:98.1 F (36.7 C), Min:98.1 F (36.7 C), Max:98.1 F (36.7 C)   Recent Labs Lab 07/22/16 1108  WBC 5.0  CREATININE 0.77    Estimated Creatinine Clearance: 69.5 mL/min (by C-G formula based on SCr of 0.77 mg/dL).    Allergies  Allergen Reactions  . Percocet [Oxycodone-Acetaminophen] Shortness Of Breath  . Celebrex [Celecoxib] Palpitations and Other (See Comments)    Chest pain, tachycardia, diaphoresis  . Niacin Rash  . Varenicline Tartrate Rash    Reaction to CHANTIX   Antimicrobials this admission: Vancomycin 10/17 >>  Zosyn 10/17 >>   Dose adjustments this admission:  Microbiology results:  BCx: pending  UCx: pending   Sputum: pending   MRSA PCR: pending  Thank you for allowing pharmacy to be a part of this patient's care.  Hart Robinsons A 07/22/2016 2:07 PM

## 2016-07-22 NOTE — Progress Notes (Signed)
Pt arrived to room 1234 at 1520.  When pt arrived she started showing right sided facial dropping.  Informed rapid response, intiated code stroke, checked cbg-131, and carelink and rapid response RN took pt to CT. Last seen normal was 1520    Irven [place], RN

## 2016-07-22 NOTE — H&P (Signed)
History and Physical    [name] PFX:[id number] DOB: 10-Sep-1946 DOA: 07/22/2016  PCP: [name], MD  Patient coming from: Home  Chief Complaint: Shortness of breath  HPI: [name] is a 69 y.o. 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 She has had shortness of breath both at rest and with activity. She has associated easy fatigability, right-sided pleuritic chest pain that radiates to her back, and generalized malaise. She had been treated a couple of times over the past couple of months for COPD exacerbations and presumed pneumonia with both steroids and antibiotics. She continues to smoke half a pack of cigarettes per day. She denies fever or chills, but has had night sweats. She has had approximately 5-10 pounds of unintentional weight loss over the past few weeks. She has had occasional headaches and constipation. She has also had occasional burning with urination. Due to her progressive shortness of breath, her oncologist, Dr. Earlie Server, ordered an outpatient CT scan of her chest on 07/18/16. It revealed progression of metastatic lung cancer as evidenced by extensive collapse/consolidation throughout the right lung and a small loculated right pleural effusion and new/enlarging mediastinal/upper abdominal lymph nodes.  ED Course: In the ED, she is afebrile and mildly tachycardic with a heart rate in the 105-115 bpm range. Her blood pressure is within normal limits. Her oxygen saturations are in the low 90s on 3 L of oxygen. Her lab data is virtually unremarkable except a mild anemia with a hemoglobin of 11.0 and mild hyperglycemia with a  glucose of 145. Her chest x-ray reveals markedly diminished aeration to the right lung when compared with 06/27/16-findings most likely reflect progression of lung cancer. She is being admitted for further evaluation and management.  Review of Systems: As per HPI otherwise 10 point review of systems negative.     Past Medical History:  Diagnosis Date  . Adenocarcinoma of right lung, stage 3 ([place]) 08/28/2009      . Asthma   . Cholelithiasis   . Chronic bronchitis ([place])   . Colon polyps   . COPD (chronic obstructive pulmonary disease) (HCC)    USES O2 AT NIGHT + PRN IN DAYTIME3L  . Coronary artery disease    a. 2005 Cath/PCI: mRCA-> Cypher DES;  b. 06/2014 Cath/PCI: EF 60%. LM nl, LAD min irregs, D1 40, LCX 20p, OM2 95 (2.5x14 Resolute DES), RCA 30p, 30 ISR, 20d, PDA/PLA nl..  11/2015  Stenting of the entire RCA and LV branch off the RCA.    . Emphysema   . FH: chemotherapy 2010    had 4 times  . Fibromyalgia   . GERD (gastroesophageal reflux disease)   . H/O hiatal hernia   . Hyperlipidemia   . Hypertension   . Lung cancer ([place]) 2010   Adenocarcinoma, right lung, node positive  . On home oxygen therapy    "2-4L at night and prn" (07/18/2104)  . Oxygen dependent    at night  3liters  . Peripheral vascular disease ([place])   . Pneumonia    hx of   . Radiation 6/10   28 times right lung, and 5 treatments to sternum  . Stress incontinence   . Tobacco  abuse   . Type II diabetes mellitus ([place])     Past Surgical History:  Procedure Laterality Date  . ABDOMINAL HYSTERECTOMY  1972  . ABDOMINAL HYSTERECTOMY    . APPENDECTOMY  1970  . arm fracture Left 3/14  . BACK SURGERY  2007  . CARDIAC CATHETERIZATION N/A 11/09/2015   Procedure: Left Heart Cath and Coronary Angiography;  Surgeon: Belva Crome, MD;  Location: [place] [facility];  Service: Cardiovascular;  Laterality: N/A;  . CARDIAC CATHETERIZATION Right 11/09/2015   Procedure: Intravascular Pressure Wire/FFR Study;  Surgeon: Belva Crome, MD;  Location: [place] [facility];  Service: Cardiovascular;  Laterality: Right;  . CARDIAC CATHETERIZATION N/A 11/09/2015   Procedure: Coronary Stent Intervention;  Surgeon: Belva Crome, MD;  Location: [place] [facility];  Service: Cardiovascular;  Laterality: N/A;  RCA  . CARPAL TUNNEL RELEASE    . CORONARY ANGIOPLASTY  2005  . EYE SURGERY     implants 02/18/12 (Left) 02/25/12 (right eye)  . FEMUR FRACTURE SURGERY Left    10/15  . FEMUR IM NAIL Left 07/19/2014   Procedure: INTRAMEDULLARY (IM) NAIL FEMORAL;  Surgeon: Alta Corning, MD;  Location: [place];  Service: Orthopedics;  Laterality: Left;  . GALLBLADDER SURGERY  1989  . HARDWARE REMOVAL Left 07/19/2015   Procedure: HARDWARE REMOVAL;  Surgeon: Mcarthur Rossetti, MD;  Location: [facility];  Service: Orthopedics;  Laterality: Left;  . JOINT REPLACEMENT    . LEFT HEART CATHETERIZATION WITH CORONARY ANGIOGRAM N/A 06/20/2014   Procedure: LEFT HEART CATHETERIZATION WITH CORONARY ANGIOGRAM;  Surgeon: Troy Sine, MD;  Location: [facility];  Service: Cardiovascular;  Laterality: N/A;  . NECK SURGERY  2005  . ORIF HUMERUS FRACTURE Left 12/14/2012   Procedure: OPEN REDUCTION INTERNAL FIXATION (ORIF) LEFT PROXIMAL HUMERUS FRACTURE;  Surgeon: Mcarthur Rossetti, MD;  Location: [place];  Service: Orthopedics;  Laterality: Left;  . PNEUMONECTOMY     Right Partial, lung cancer confirmed, nod positive  . PORT-A-CATH REMOVAL  05/12/2012   Procedure: REMOVAL PORT-A-CATH;  Surgeon: Melrose Nakayama, MD;  Location: [place];  Service: Thoracic;  Laterality: Left;  . PORTACATH PLACEMENT  12/12/2007  . REFRACTIVE SURGERY Bilateral 2/14  . REPLACEMENT TOTAL KNEE  2001   left  . Right Upper Lobectomy with lymph node dissection  10/12/2008   Dr Arlyce Dice  . ROTATOR CUFF REPAIR     RIGHT SHOULDER  2 YRS  . TONSILLECTOMY  1966  . TOTAL KNEE REVISION Left 07/19/2015   Procedure: Removal Intramedullary Rod and Screws Left Knee/Femur, Revision  arthroplasty Left knee;  Surgeon: Mcarthur Rossetti, MD;  Location: [facility];  Service: Orthopedics;  Laterality: Left;    Social history: She is widowed. Her daughter, mother, and grandchildren live with her. She reports that she has been smoking Cigarettes-half a pack of cigarettes per day. She has a 53.00 pack-year smoking history. She has never used smokeless tobacco. She reports that she does not drink alcohol or use drugs.  Allergies  Allergen Reactions  . Percocet [Oxycodone-Acetaminophen] Shortness Of Breath  . Celebrex [Celecoxib] Palpitations and Other (See Comments)    Chest pain, tachycardia, diaphoresis  . Niacin Rash  . Varenicline Tartrate Rash    Reaction to CHANTIX    Family History  Problem Relation Age of Onset  . Diabetes Mother   . Heart failure Mother   . Diabetes Father   . CAD Father 4  . Arthritis    .  Lung disease    . Cancer    . Asthma       Prior to Admission medications   Medication Sig Start Date End Date Taking? Authorizing Provider  aspirin EC 81 MG tablet Take 81 mg by mouth daily.    Yes Historical Provider, MD  Cholecalciferol (VITAMIN D) 1000 UNITS capsule Take 1,000 Units by mouth daily.    Yes Historical Provider, MD  clopidogrel (PLAVIX) 75 MG tablet Take 1 tablet (75 mg total) by mouth daily with breakfast. 11/10/15  Yes Brett Canales, PA-C  empagliflozin (JARDIANCE) 25 MG TABS tablet Take 25 mg by mouth daily.   Yes Historical Provider, MD  esomeprazole (NEXIUM) 40 MG capsule Take 40 mg by mouth as needed (for heartburn/ acid reflux).    Yes Historical Provider, MD  fluticasone furoate-vilanterol (BREO ELLIPTA) 100-25 MCG/INH AEPB Inhale 1 puff into the lungs daily.   Yes Historical Provider, MD  furosemide (LASIX) 20 MG tablet Take 1 tablet (20 mg total) by mouth daily as needed for edema. 06/20/16  Yes Kelvin Cellar, MD  insulin aspart (NOVOLOG) 100 UNIT/ML injection Inject 1,015 Units into the skin as needed. Per sliding scale: CBG  <200 15 units, 200-260 20 units, >260 25 units   Yes Historical Provider, MD  insulin detemir (LEVEMIR) 100 UNIT/ML injection Inject 45-55 Units into the skin 2 (two) times daily. Inject 55 units subcutaneously every morning and 45 units at bedtime   Yes Historical Provider, MD  lisinopril (PRINIVIL,ZESTRIL) 10 MG tablet Take 1 tablet (10 mg total) by mouth daily. 03/11/16  Yes Minus Breeding, MD  lovastatin (MEVACOR) 40 MG tablet Take 40 mg by mouth daily after supper.   Yes Historical Provider, MD  metoprolol tartrate (LOPRESSOR) 25 MG tablet TAKE 1 TABLET BY MOUTH IN THE AM & 1/2 TAB BY MOUTH IN THE PM 05/02/16  Yes Minus Breeding, MD  Polyethyl Glycol-Propyl Glycol (SYSTANE) 0.4-0.3 % SOLN Place 1 drop into both eyes 2 (two) times daily.    Yes Historical Provider, MD  pregabalin (LYRICA) 50 MG capsule Take 50 mg by mouth 2 (two) times daily.    Yes Historical Provider, MD  umeclidinium bromide (INCRUSE ELLIPTA) 62.5 MCG/INH AEPB Inhale 1 puff into the lungs daily.   Yes Historical Provider, MD  acetaminophen (TYLENOL) 500 MG tablet Take 1,000 mg by mouth every 6 (six) hours as needed for mild pain.     Historical Provider, MD  albuterol (PROVENTIL) (2.5 MG/3ML) 0.083% nebulizer solution Take 2.5 mg by nebulization every 6 (six) hours as needed for wheezing or shortness of breath.    Historical Provider, MD  metFORMIN (GLUCOPHAGE) 500 MG tablet Take 1 tablet (500 mg total) by mouth 2 (two) times daily with a meal. 11/10/15   Brett Canales, PA-C  nitroGLYCERIN (NITROSTAT) 0.4 MG SL tablet Place 1 tablet (0.4 mg total) under the tongue every 5 (five) minutes x 3 doses as needed for chest pain. Patient not taking: Reported on 07/15/2016 06/21/14   Erlene Quan, PA-C    Physical Exam: Vitals:   07/22/16 1200 07/22/16 1300 07/22/16 1337 07/22/16 1408  BP: 125/61 106/56 139/69   Pulse: 110 110 112   Resp: '25 26 23   '$ Temp:      SpO2: 92% 94% 93% 92%  Weight:      Height:           Constitutional:Obese 70 year old woman who appears ill and is dyspneic when speaking. Vitals:   07/22/16 1200 07/22/16  1300 07/22/16 1337 07/22/16 1408  BP: 125/61 106/56 139/69   Pulse: 110 110 112   Resp: '25 26 23   '$ Temp:      SpO2: 92% 94% 93% 92%  Weight:      Height:       Eyes: PERRL, lids and conjunctivae normal ENMT: Mucous membranes are Mildly dry. Posterior pharynx clear of any exudate or lesions.Normal dentition.  Neck: normal, supple, no masses, no thyromegaly Respiratory: Globally decreased breath sounds of the entire right lung; crackly wheezes on the left. Breathing is labored when speaking.  No accessory muscle use.  Cardiovascular: S1, S2, with a soft systolic murmur and tachycardia. No extremity edema. 2+ pedal pulses. No carotid bruits.  Abdomen: no tenderness, no masses palpated. No hepatosplenomegaly. Bowel sounds positive.  Musculoskeletal: no clubbing / cyanosis. No joint deformity upper and lower extremities. Good ROM, no contractures. Normal muscle tone.  Skin: no rashes, lesions, ulcers. No induration Neurologic: CN 2-12 grossly intact. Sensation intact, DTR normal. Strength 5/5 in all 4.  Psychiatric: Normal judgment and insight. Alert and oriented x 3. Normal mood.    Labs on Admission: I have personally reviewed following labs and imaging studies  CBC:  Recent Labs Lab 07/22/16 1108  WBC 5.0  NEUTROABS 3.2  HGB 11.0*  HCT 37.0  MCV 83.3  PLT 694   Basic Metabolic Panel:  Recent Labs Lab 07/22/16 1108  NA 139  K 3.8  CL 103  CO2 28  GLUCOSE 145*  BUN 16  CREATININE 0.77  CALCIUM 8.8*   GFR: Estimated Creatinine Clearance: 69.5 mL/min (by C-G formula based on SCr of 0.77 mg/dL). Liver Function Tests:  Recent Labs Lab 07/22/16 1108  AST 18  ALT 13*  ALKPHOS 58  BILITOT 0.6  PROT 6.4*  ALBUMIN 3.4*   No results for input(s): LIPASE, AMYLASE in the last 168 hours. No results for input(s): AMMONIA in the last 168  hours. Coagulation Profile: No results for input(s): INR, PROTIME in the last 168 hours. Cardiac Enzymes:  Recent Labs Lab 07/22/16 1108  TROPONINI <0.03   BNP (last 3 results) No results for input(s): PROBNP in the last 8760 hours. HbA1C: No results for input(s): HGBA1C in the last 72 hours. CBG:  Recent Labs Lab 07/22/16 1404  GLUCAP 145*   Lipid Profile: No results for input(s): CHOL, HDL, LDLCALC, TRIG, CHOLHDL, LDLDIRECT in the last 72 hours. Thyroid Function Tests: No results for input(s): TSH, T4TOTAL, FREET4, T3FREE, THYROIDAB in the last 72 hours. Anemia Panel: No results for input(s): VITAMINB12, FOLATE, FERRITIN, TIBC, IRON, RETICCTPCT in the last 72 hours. Urine analysis:    Component Value Date/Time   COLORURINE YELLOW 06/17/2016 [place] 06/17/2016 1023   LABSPEC 1.020 06/17/2016 1023   PHURINE 5.5 06/17/2016 1023   GLUCOSEU >1000 (A) 06/17/2016 1023   HGBUR NEGATIVE 06/17/2016 1023   BILIRUBINUR NEGATIVE 06/17/2016 1023   KETONESUR NEGATIVE 06/17/2016 1023   PROTEINUR NEGATIVE 06/17/2016 1023   UROBILINOGEN 0.2 06/07/2015 1122   NITRITE POSITIVE (A) 06/17/2016 1023   LEUKOCYTESUR NEGATIVE 06/17/2016 1023   Sepsis Labs: !!!!!!!!!!!!!!!!!!!!!!!!!!!!!!!!!!!!!!!!!!!! '@LABRCNTIP'$ (procalcitonin:4,lacticidven:4) )No results found for this or any previous visit (from the past 240 hour(s)).   Radiological Exams on Admission: Dg Chest 2 View  Result Date: 07/22/2016 CLINICAL DATA:  Increased shortness of breath for 1 week. EXAM: CHEST  2 VIEW COMPARISON:  06/27/2016 and CT from 07/18/2016 FINDINGS: The heart size appears normal. There is near complete opacification of the entire  right hemi thorax with a small focus of aerated lung in the right upper lung zone. There is subsegmental atelectasis within the left base. When compared with the previous chest radiograph there has been significantly diminished aeration to the entire right hemi thorax.  Today's imaging findings correlate with CT appearance of the right lung from 07/18/2016. IMPRESSION: 1. Markedly diminished aeration to the right lung when compared with 06/27/2016. Findings most likely reflect progression of lung cancer. Electronically Signed   By: Kerby Moors M.D.   On: 07/22/2016 12:05    EKG: Independently reviewed.   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 Julien Nordmann. The patient reports being in remission for approximately 8 years. Her history is also significant for coronary artery disease with recent stenting of the RCA in 11/2015. She presents in respiratory distress and with outpatient radiographic evidence of progression of right-sided lung cancer with right lung collapse/consolidation and a small loculated pleural effusion. DO NOT RESUSCITATE status was discussed by the EDP. She requested to be a DNR. This was confirmed by the dictating physician. She is receptive to noninvasive treatment such as BiPAP if needed.   Plan: 1. Per the patient's request, she is being transferred to [facility] to discuss treatment options with her primary oncologist, Dr. Earlie Server; stepdown unit. I have discussed the patient with my colleague Dr. Waldron Labs who will be the accepting physician. 2. Patient was discussed with her primary oncologist, Dr. Julien Nordmann. He stated that there were not many options, but he would discuss her with radiation oncology. He will be happy to see her in consultation when she arrives at the [facility]. 3. The patient was given vancomycin and Zosyn in the  ED. These antibodies will be continued for the loculated pleural effusion. Will also start gentle IV Solumedrol. Due to her tachycardia, will start Xopenex for bronchodilation. We'll continue Breo Ellipta inhaler. 4. Will continue oxygen titrated carefully due to her COPD. 5. We'll continue her chronic medications including her antihypertensive medications, aspirin/Plavix, statin and PPI. 6. Will decrease Levemir dosing to 15 units twice a day due to undetermined intake. We'll continue sliding scale NovoLog. Insulin can be titrated up accordingly in light of started IV steroids. Will hold her oral hypoglycemic agents for now. 7. Will start gentle IV fluids. 8. Patient was encouraged to stop smoking. Will order tobacco cessation counseling.     DVT prophylaxis: Lovenox Code Status: DO NOT RESUSCITATE Family Communication: Discussed with her daughter Disposition Plan: Transfer to [facility] long hospital Consults called: Oncologist, Dr. Earlie Server Admission status: Inpatient admission to stepdown unit   [facility] MD [facility] Pager [phone]  If 7PM-7AM, please contact night-coverage [email or web address] Password [email or web address]  07/22/2016, 2:09 PM

## 2016-07-22 NOTE — ED Triage Notes (Signed)
Pt c/o increasing sob x 1 week. Sent from Dr. Juel Burrow office for fluid on right lung.

## 2016-07-22 NOTE — ED Provider Notes (Signed)
[place] DEPT Provider Note   CSN: [id number] Arrival date & time: 07/22/16  1024     History   Chief Complaint Chief Complaint  Patient presents with  . Shortness of Breath    HPI [name] is a 70 y.o. female.  Patient presents with symptoms of difficult breathing, and acute stroke symptoms.  Last time known normal was 1520 p.m. for all in route via transfer from [facility] to [facility] ICU for admission.  Patient has history of adenocarcinoma of the lung. Seen and evaluated at [facility]. Had complete opacification of right hemithorax with pleural fluid and arranged for transfer to [facility] for ICU admission.  Per paramedics she was talking in route. She suddenly stopped and they described that her face "suddenly droop and right arm became limp". Pulmonary the hospital. They state that her symptoms were acute in the did not feel safe to proceed to her room and everted to the emergency room. Apparently were taken directly to CT scanner. I was asked by nursing staff to evaluate the patient as a "code stroke".  HPI  Past Medical History:  Diagnosis Date  . Adenocarcinoma of right lung, stage 3 ([place]) 08/28/2009      . Asthma   . Cholelithiasis   . Chronic bronchitis ([place])   . Colon polyps   . COPD (chronic obstructive pulmonary disease) (HCC)    USES O2 AT NIGHT + PRN IN DAYTIME3L  . Coronary artery disease    a. 2005 Cath/PCI: mRCA-> [place] DES;  b. 06/2014 Cath/PCI: EF 60%. LM nl, LAD min irregs, D1 40, LCX 20p, OM2 95 (2.5x14 Resolute DES), RCA 30p, 30 ISR, 20d, PDA/PLA nl..  11/2015  Stenting of the entire RCA and LV branch off the RCA.    . Emphysema   . FH: chemotherapy 2010    had 4 times  . Fibromyalgia   . GERD (gastroesophageal reflux disease)   . H/O hiatal hernia   . Hyperlipidemia   . Hypertension   . Lung cancer ([place]) 2010   Adenocarcinoma, right lung, node positive  . On home oxygen therapy    "2-4L at night and  prn" (07/18/2104)  . Oxygen dependent    at night  3liters  . Peripheral vascular disease ([place])   . Pneumonia    hx of   . Radiation 6/10   28 times right lung, and 5 treatments to sternum  . Stress incontinence   . Tobacco abuse   . Type II diabetes mellitus [facility])     Patient Active Problem List   Diagnosis Date Noted  . Respiratory failure ([place]) 07/22/2016  . Collapse of right lung 07/22/2016  . Primary cancer of right lung ([place]) 07/22/2016  . Loculated pleural effusion 07/22/2016  . CAP (community acquired pneumonia) 06/17/2016  . Acute on chronic respiratory failure with hypoxia ([place]) 06/17/2016  . Morbid obesity ([place]) 11/10/2015  . Coronary artery disease involving native coronary artery of native heart with unstable angina pectoris ([place])   . 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 Coronary artery disease   . GERD (gastroesophageal reflux disease)   . Hyperlipidemia   . COPD with hypoxia ([place])   . Hypertension   . Tobacco abuse 07/29/2010  . Adenocarcinoma of right lung, stage 3 ([place]) 08/28/2009  . RADIATION PNEUMONITIS 08/28/2009  . Type II diabetes mellitus ([place]) 07/30/2008  . Hyperlipidemia LDL goal <70 07/30/2008  . Essential hypertension 07/28/2008  .  OXYGEN-USE OF SUPPLEMENTAL 07/28/2008    Past Surgical History:  Procedure Laterality Date  . ABDOMINAL HYSTERECTOMY  1972  . ABDOMINAL HYSTERECTOMY    . APPENDECTOMY  1970  . arm fracture Left 3/14  . BACK SURGERY  2007  . CARDIAC CATHETERIZATION N/A 11/09/2015   Procedure: Left Heart Cath and Coronary Angiography;  Surgeon: Belva Crome, MD;  Location: [place] CV LAB;  Service: Cardiovascular;  Laterality: N/A;  . CARDIAC CATHETERIZATION Right 11/09/2015   Procedure: Intravascular Pressure Wire/FFR Study;  Surgeon: Belva Crome, MD;  Location: [facility] CV LAB;  Service: Cardiovascular;  Laterality: Right;  . CARDIAC CATHETERIZATION N/A 11/09/2015   Procedure: Coronary Stent  Intervention;  Surgeon: Belva Crome, MD;  Location: [facility] CV LAB;  Service: Cardiovascular;  Laterality: N/A;  RCA  . CARPAL TUNNEL RELEASE    . CORONARY ANGIOPLASTY  2005  . EYE SURGERY     implants 02/18/12 (Left) 02/25/12 (right eye)  . FEMUR FRACTURE SURGERY Left    10/15  . FEMUR IM NAIL Left 07/19/2014   Procedure: INTRAMEDULLARY (IM) NAIL FEMORAL;  Surgeon: Alta Corning, MD;  Location: [facility];  Service: Orthopedics;  Laterality: Left;  . GALLBLADDER SURGERY  1989  . HARDWARE REMOVAL Left 07/19/2015   Procedure: HARDWARE REMOVAL;  Surgeon: Mcarthur Rossetti, MD;  Location: [facility];  Service: Orthopedics;  Laterality: Left;  . JOINT REPLACEMENT    . LEFT HEART CATHETERIZATION WITH CORONARY ANGIOGRAM N/A 06/20/2014   Procedure: LEFT HEART CATHETERIZATION WITH CORONARY ANGIOGRAM;  Surgeon: Troy Sine, MD;  Location: [facility] CATH LAB;  Service: Cardiovascular;  Laterality: N/A;  . NECK SURGERY  2005  . ORIF HUMERUS FRACTURE Left 12/14/2012   Procedure: OPEN REDUCTION INTERNAL FIXATION (ORIF) LEFT PROXIMAL HUMERUS FRACTURE;  Surgeon: Mcarthur Rossetti, MD;  Location: [facility];  Service: Orthopedics;  Laterality: Left;  . PNEUMONECTOMY     Right Partial, lung cancer confirmed, nod positive  . PORT-A-CATH REMOVAL  05/12/2012   Procedure: REMOVAL PORT-A-CATH;  Surgeon: Melrose Nakayama, MD;  Location: [facility];  Service: Thoracic;  Laterality: Left;  . PORTACATH PLACEMENT  12/12/2007  . REFRACTIVE SURGERY Bilateral 2/14  . REPLACEMENT TOTAL KNEE  2001   left  . Right Upper Lobectomy with lymph node dissection  10/12/2008   Dr Arlyce Dice  . ROTATOR CUFF REPAIR     RIGHT SHOULDER  2 YRS  . TONSILLECTOMY  1966  . TOTAL KNEE REVISION Left 07/19/2015   Procedure: Removal Intramedullary Rod and Screws Left Knee/Femur, Revision arthroplasty Left knee;  Surgeon: Mcarthur Rossetti, MD;  Location: [facility];  Service: Orthopedics;  Laterality: Left;    OB History    No data available        Home Medications    Prior to Admission medications   Medication Sig Start Date End Date Taking? Authorizing Provider  aspirin EC 81 MG tablet Take 81 mg by mouth daily.    Yes Historical Provider, MD  Cholecalciferol (VITAMIN D) 1000 UNITS capsule Take 1,000 Units by mouth daily.    Yes Historical Provider, MD  clopidogrel (PLAVIX) 75 MG tablet Take 1 tablet (75 mg total) by mouth daily with breakfast. 11/10/15  Yes Brett Canales, PA-C  empagliflozin (JARDIANCE) 25 MG TABS tablet Take 25 mg by mouth daily.   Yes Historical Provider, MD  esomeprazole (NEXIUM) 40 MG capsule Take 40 mg by mouth as needed (for heartburn/ acid reflux).    Yes Historical Provider,  MD  fluticasone furoate-vilanterol (BREO ELLIPTA) 100-25 MCG/INH AEPB Inhale 1 puff into the lungs daily.   Yes Historical Provider, MD  furosemide (LASIX) 20 MG tablet Take 1 tablet (20 mg total) by mouth daily as needed for edema. 06/20/16  Yes Kelvin Cellar, MD  insulin aspart (NOVOLOG) 100 UNIT/ML injection Inject 1,015 Units into the skin as needed. Per sliding scale: CBG <200 15 units, 200-260 20 units, >260 25 units   Yes Historical Provider, MD  insulin detemir (LEVEMIR) 100 UNIT/ML injection Inject 45-55 Units into the skin 2 (two) times daily. Inject 55 units subcutaneously every morning and 45 units at bedtime   Yes Historical Provider, MD  lisinopril (PRINIVIL,ZESTRIL) 10 MG tablet Take 1 tablet (10 mg total) by mouth daily. 03/11/16  Yes Minus Breeding, MD  lovastatin (MEVACOR) 40 MG tablet Take 40 mg by mouth daily after supper.   Yes Historical Provider, MD  metoprolol tartrate (LOPRESSOR) 25 MG tablet TAKE 1 TABLET BY MOUTH IN THE AM & 1/2 TAB BY MOUTH IN THE PM 05/02/16  Yes Minus Breeding, MD  Polyethyl Glycol-Propyl Glycol (SYSTANE) 0.4-0.3 % SOLN Place 1 drop into both eyes 2 (two) times daily.    Yes Historical Provider, MD  pregabalin (LYRICA) 50 MG capsule Take 50 mg by mouth 2 (two) times daily.    Yes Historical  Provider, MD  umeclidinium bromide (INCRUSE ELLIPTA) 62.5 MCG/INH AEPB Inhale 1 puff into the lungs daily.   Yes Historical Provider, MD  acetaminophen (TYLENOL) 500 MG tablet Take 1,000 mg by mouth every 6 (six) hours as needed for mild pain.     Historical Provider, MD  albuterol (PROVENTIL) (2.5 MG/3ML) 0.083% nebulizer solution Take 2.5 mg by nebulization every 6 (six) hours as needed for wheezing or shortness of breath.    Historical Provider, MD  metFORMIN (GLUCOPHAGE) 500 MG tablet Take 1 tablet (500 mg total) by mouth 2 (two) times daily with a meal. 11/10/15   Brett Canales, PA-C  nitroGLYCERIN (NITROSTAT) 0.4 MG SL tablet Place 1 tablet (0.4 mg total) under the tongue every 5 (five) minutes x 3 doses as needed for chest pain. Patient not taking: Reported on 07/15/2016 06/21/14   Erlene Quan, PA-C    Family History Family History  Problem Relation Age of Onset  . Diabetes Mother   . Heart failure Mother   . Diabetes Father   . CAD Father 65  . Arthritis    . Lung disease    . Cancer    . Asthma      Social History Social History  Substance Use Topics  . Smoking status: Current Every Day Smoker    Packs/day: 1.00    Years: 53.00    Types: Cigarettes  . Smokeless tobacco: Never Used  . Alcohol use No     Allergies   Percocet [oxycodone-acetaminophen]; Celebrex [celecoxib]; Niacin; and Varenicline tartrate   Review of Systems Review of Systems  Unable to perform ROS: Patient nonverbal   patient is On CT table. Has mask O2. Does have symmetric smile. Has "giveaway" weakness and right upper shoulder. She is unable to hold her arm for a second before letting it collapse. She can elevate both heels off of the CT table. Blood sugar 134.   Physical Exam Updated Vital Signs BP 139/69   Pulse 112   Temp 98.1 F (36.7 C)   Resp 23   Ht '5\' 2"'$  (1.575 m)   Wt 205 lb (93 kg)   SpO2  92%   BMI 37.49 kg/m   Physical Exam  Constitutional: She is oriented to person,  place, and time. She appears well-developed and well-nourished. No distress.  Chronically ill. Laying supine. Mask O2. Symmetric smile. Pupils reactive.  HENT:  Head: Normocephalic.  Eyes: Conjunctivae are normal. Pupils are equal, round, and reactive to light. No scleral icterus.  Neck: Normal range of motion. Neck supple. No thyromegaly present.  Cardiovascular: Normal rate and regular rhythm.  Exam reveals no gallop and no friction rub.   No murmur heard. Pulmonary/Chest: Effort normal and breath sounds normal. No respiratory distress. She has no wheezes. She has no rales.  Tachypneic. No marked increased work of breathing. Saturations 96% on mask. Diminished right sided diffuse breath sounds.  Abdominal: Soft. Bowel sounds are normal. She exhibits no distension. There is no tenderness. There is no rebound.  Musculoskeletal: Normal range of motion.  Neurological: She is alert and oriented to person, place, and time.  Moving all 4. Does have giveaway strength in right upper trauma. Cannot tell if this is actual weakness versus poor effort. Is able to hold the arm and placed for second before it drops.  Skin: Skin is warm and dry. No rash noted.  Psychiatric: She has a normal mood and affect. Her behavior is normal.     ED Treatments / Results  Labs (all labs ordered are listed, but only abnormal results are displayed) Labs Reviewed  CBC WITH DIFFERENTIAL/PLATELET - Abnormal; Notable for the following:       Result Value   Hemoglobin 11.0 (*)    MCH 24.8 (*)    MCHC 29.7 (*)    RDW 18.1 (*)    All other components within normal limits  COMPREHENSIVE METABOLIC PANEL - Abnormal; Notable for the following:    Glucose, Bld 145 (*)    Calcium 8.8 (*)    Total Protein 6.4 (*)    Albumin 3.4 (*)    ALT 13 (*)    All other components within normal limits  CBG MONITORING, ED - Abnormal; Notable for the following:    Glucose-Capillary 145 (*)    All other components within normal limits   TROPONIN I    EKG  EKG Interpretation  Date/Time:  Tuesday July 22 2016 10:35:39 EDT Ventricular Rate:  111 PR Interval:    QRS Duration: 78 QT Interval:  329 QTC Calculation: 447 R Axis:   -47 Text Interpretation:  Sinus tachycardia Left anterior fascicular block Anterior infarct, old Baseline wander in lead(s) III No significant change was found Confirmed by CAMPOS  MD, Lennette Bihari ([id number]) on 07/22/2016 12:27:12 PM       Radiology Ct Angio Head W Or Wo Contrast  Result Date: 07/22/2016 CLINICAL DATA:  Right-sided facial droop EXAM: CT ANGIOGRAPHY HEAD AND NECK TECHNIQUE: Multidetector CT imaging of the head and neck was performed using the standard protocol during bolus administration of intravenous contrast. Multiplanar CT image reconstructions and MIPs were obtained to evaluate the vascular anatomy. Carotid stenosis measurements (when applicable) are obtained utilizing NASCET criteria, using the distal internal carotid diameter as the denominator. CONTRAST:  100 mL Isovue 370 IV COMPARISON:  Head CT 07/22/2016 FINDINGS: CTA NECK FINDINGS Aortic arch: There is a normal variant aortic arch branching pattern with the brachiocephalic and left common carotid arteries sharing a common origin. The visualized bilateral proximal subclavian arteries are normal. There is atherosclerotic calcification of the aortic arch. Right carotid system: There is atherosclerotic plaque at the right carotid bifurcation resulting  in 60 percent stenosis. The remainder of the cervical right ICA is normal. Left carotid system: There is atherosclerotic plaque and calcification of the proximal left ICA resulting in 50 percent stenosis. Vertebral arteries: The right vertebral artery origin and V1 segment are not visualized. The diminutive right vertebral artery is 1st identified at the C3 level. The remainder of its V2 and V3 segments are normal. There is atherosclerotic calcification the narrows the lumen as it enters the  foramen magnum, but a remains patent to the confluence with the basilar artery. The left vertebral artery is dominant and normal along its entire course. There is atherosclerotic calcification that narrows its origin. Skeleton: Anterior cervical fusion hardware at C5-6. Other neck: Unremarkable Upper chest: There is complete opacification of the right upper lobe associated collapse and large pleural effusion. This is better characterized on recent chest CT. Review of the MIP images confirms the above findings CTA HEAD FINDINGS Anterior circulation: --Intracranial internal carotid arteries: There is atherosclerotic calcification of the proximal intracranial internal carotid artery is without greater than 50 percent stenosis. --Anterior cerebral arteries: Normal. --Middle cerebral arteries: Normal. --Posterior communicating arteries: Present bilaterally, larger on the right. Posterior circulation: --Posterior cerebral arteries: Fetal origin of the right PCA. Normal left PCA. --Superior cerebellar arteries: Normal. --Basilar artery: Normal. --Anterior inferior cerebellar arteries: Normal. --Posterior inferior cerebellar arteries: Normal. Venous sinuses: As permitted by contrast timing, patent. Anatomic variants: None Delayed phase: Not performed Review of the MIP images confirms the above findings IMPRESSION: 1. No occlusion or high-grade stenosis of the intracranial arteries. 2. Bilateral proximal internal carotid artery stenosis, measuring 60 percent on the right and 50 percent on the left. 3. Diminutive right vertebral artery with lack of opacification below the C3 level. This may be congenital or secondary to chronic occlusion/stenosis. Electronically Signed   By: Ulyses Jarred M.D.   On: 07/22/2016 16:29   Dg Chest 2 View  Result Date: 07/22/2016 CLINICAL DATA:  Increased shortness of breath for 1 week. EXAM: CHEST  2 VIEW COMPARISON:  06/27/2016 and CT from 07/18/2016 FINDINGS: The heart size appears normal.  There is near complete opacification of the entire right hemi thorax with a small focus of aerated lung in the right upper lung zone. There is subsegmental atelectasis within the left base. When compared with the previous chest radiograph there has been significantly diminished aeration to the entire right hemi thorax. Today's imaging findings correlate with CT appearance of the right lung from 07/18/2016. IMPRESSION: 1. Markedly diminished aeration to the right lung when compared with 06/27/2016. Findings most likely reflect progression of lung cancer. Electronically Signed   By: Kerby Moors M.D.   On: 07/22/2016 12:05   Ct Angio Neck W Or Wo Contrast  Result Date: 07/22/2016 CLINICAL DATA:  Right-sided facial droop EXAM: CT ANGIOGRAPHY HEAD AND NECK TECHNIQUE: Multidetector CT imaging of the head and neck was performed using the standard protocol during bolus administration of intravenous contrast. Multiplanar CT image reconstructions and MIPs were obtained to evaluate the vascular anatomy. Carotid stenosis measurements (when applicable) are obtained utilizing NASCET criteria, using the distal internal carotid diameter as the denominator. CONTRAST:  100 mL Isovue 370 IV COMPARISON:  Head CT 07/22/2016 FINDINGS: CTA NECK FINDINGS Aortic arch: There is a normal variant aortic arch branching pattern with the brachiocephalic and left common carotid arteries sharing a common origin. The visualized bilateral proximal subclavian arteries are normal. There is atherosclerotic calcification of the aortic arch. Right carotid system: There is atherosclerotic plaque  at the right carotid bifurcation resulting in 60 percent stenosis. The remainder of the cervical right ICA is normal. Left carotid system: There is atherosclerotic plaque and calcification of the proximal left ICA resulting in 50 percent stenosis. Vertebral arteries: The right vertebral artery origin and V1 segment are not visualized. The diminutive right  vertebral artery is 1st identified at the C3 level. The remainder of its V2 and V3 segments are normal. There is atherosclerotic calcification the narrows the lumen as it enters the foramen magnum, but a remains patent to the confluence with the basilar artery. The left vertebral artery is dominant and normal along its entire course. There is atherosclerotic calcification that narrows its origin. Skeleton: Anterior cervical fusion hardware at C5-6. Other neck: Unremarkable Upper chest: There is complete opacification of the right upper lobe associated collapse and large pleural effusion. This is better characterized on recent chest CT. Review of the MIP images confirms the above findings CTA HEAD FINDINGS Anterior circulation: --Intracranial internal carotid arteries: There is atherosclerotic calcification of the proximal intracranial internal carotid artery is without greater than 50 percent stenosis. --Anterior cerebral arteries: Normal. --Middle cerebral arteries: Normal. --Posterior communicating arteries: Present bilaterally, larger on the right. Posterior circulation: --Posterior cerebral arteries: Fetal origin of the right PCA. Normal left PCA. --Superior cerebellar arteries: Normal. --Basilar artery: Normal. --Anterior inferior cerebellar arteries: Normal. --Posterior inferior cerebellar arteries: Normal. Venous sinuses: As permitted by contrast timing, patent. Anatomic variants: None Delayed phase: Not performed Review of the MIP images confirms the above findings IMPRESSION: 1. No occlusion or high-grade stenosis of the intracranial arteries. 2. Bilateral proximal internal carotid artery stenosis, measuring 60 percent on the right and 50 percent on the left. 3. Diminutive right vertebral artery with lack of opacification below the C3 level. This may be congenital or secondary to chronic occlusion/stenosis. Electronically Signed   By: Ulyses Jarred M.D.   On: 07/22/2016 16:29   Ct Head Code Stroke Wo  Contrast  Addendum Date: 07/22/2016   ADDENDUM REPORT: 07/22/2016 15:59 ADDENDUM: Study discussed by telephone with Dr. Jeneen Rinks On 07/22/2016 at 1555 hours. Electronically Signed   By: Genevie Ann M.D.   On: 07/22/2016 15:59   Result Date: 07/22/2016 CLINICAL DATA:  Code stroke. 70 year old female with sudden change in mental status, left facial droop and left extremity weakness. Initial encounter. EXAM: CT HEAD WITHOUT CONTRAST TECHNIQUE: Contiguous axial images were obtained from the base of the skull through the vertex without intravenous contrast. COMPARISON:  Head CTs without contrast 11/27/2012 and earlier. FINDINGS: Brain: Cerebral volume is within normal limits for age. Stable and normal Stable gray-white matter differentiation throughout the brain. No midline shift, ventriculomegaly, mass effect, evidence of mass lesion, intracranial hemorrhage or evidence of cortically based acute infarction. Vascular: Extensive Calcified atherosclerosis at the skull base. No suspicious intracranial vascular hyperdensity. Skull: No acute osseous abnormality identified. Hyperostosis frontalis. Sinuses/Orbits: Visualized paranasal sinuses and mastoids are stable and well pneumatized. Other: No acute orbit or scalp soft tissue finding. ASPECTS ([place] Stroke Program Early CT Score) - Ganglionic level infarction (caudate, lentiform nuclei, internal capsule, insula, M1-M3 cortex): 7 - Supraganglionic infarction (M4-M6 cortex): 3 Total score (0-10 with 10 being normal): 10 IMPRESSION: 1. Stable and normal for age noncontrast CT appearance of the brain. 2. ASPECTS is 10. Electronically Signed: By: Genevie Ann M.D. On: 07/22/2016 15:48    Procedures Procedures (including critical care time)  Medications Ordered in ED Medications  fluticasone furoate-vilanterol (BREO ELLIPTA) 100-25 MCG/INH 1 puff (not administered)  furosemide (LASIX) tablet 20 mg (not administered)  umeclidinium bromide (INCRUSE ELLIPTA) 62.5 MCG/INH 1  puff (not administered)  metoprolol tartrate (LOPRESSOR) tablet 25 mg (not administered)  lisinopril (PRINIVIL,ZESTRIL) tablet 10 mg (not administered)  aspirin EC tablet 81 mg (not administered)  clopidogrel (PLAVIX) tablet 75 mg (not administered)  pravastatin (PRAVACHOL) tablet 40 mg (not administered)  pregabalin (LYRICA) capsule 50 mg (not administered)  nitroGLYCERIN (NITROSTAT) SL tablet 0.4 mg (not administered)  pantoprazole (PROTONIX) EC tablet 80 mg (not administered)  polyvinyl alcohol (LIQUIFILM TEARS) 1.4 % ophthalmic solution 1 drop (not administered)  enoxaparin (LOVENOX) injection 40 mg (not administered)  0.9 % NaCl with KCl 20 mEq/ L  infusion (not administered)  acetaminophen (TYLENOL) tablet 650 mg (not administered)    Or  acetaminophen (TYLENOL) suppository 650 mg (not administered)  HYDROcodone-acetaminophen (NORCO/VICODIN) 5-325 MG per tablet 1-2 tablet (not administered)  senna (SENOKOT) tablet 8.6 mg (not administered)  ondansetron (ZOFRAN) tablet 4 mg (not administered)    Or  ondansetron (ZOFRAN) injection 4 mg (not administered)  levalbuterol (XOPENEX) nebulizer solution 0.63 mg (0.63 mg Nebulization Not Given 07/22/16 1406)  methylPREDNISolone sodium succinate (SOLU-MEDROL) 40 mg/mL injection 40 mg (not administered)  insulin aspart (novoLOG) injection 0-15 Units (not administered)  insulin aspart (novoLOG) injection 0-5 Units (not administered)  vancomycin (VANCOCIN) IVPB 1000 mg/200 mL premix (not administered)  piperacillin-tazobactam (ZOSYN) IVPB 3.375 g (not administered)  insulin detemir (LEVEMIR) injection 15 Units (not administered)  metoprolol tartrate (LOPRESSOR) tablet 12.5 mg (not administered)  vancomycin (VANCOCIN) IVPB 1000 mg/200 mL premix (0 mg Intravenous Stopped 07/22/16 1323)  piperacillin-tazobactam (ZOSYN) IVPB 3.375 g (0 g Intravenous Stopped 07/22/16 1225)  levalbuterol (XOPENEX) nebulizer solution 0.63 mg (0.63 mg Nebulization  Given 07/22/16 1403)  iopamidol (ISOVUE-370) 76 % injection 100 mL (100 mLs Intravenous Contrast Given 07/22/16 1552)     Initial Impression / Assessment and Plan / ED Course  I have reviewed the triage vital signs and the nursing notes.  Pertinent labs & imaging results that were available during my care of the patient were reviewed by me and considered in my medical decision making (see chart for details).  Clinical Course    I discussed the patient with neurology Dr.Ahern while observing the patient's CT with patient on the CT table and examining her in the room. We were in agreement that there does not appear to be clear indication for acute lytic therapy in this acute and chronically ill patient with end-stage lung cancer. However, she recommended immediate diffusion weighted MRI to establish presence or absence of acute stroke. There are no ICU beds available at [facility]. She has ICU bed at [facility]. Agreement was made to arrange for immediate MRI and make further decisions based upon the results of this. We will be in communication with the patient's admitting physician. I have discussed the patient with with Dr.Elergaway, the admitting physician here at [facility] and updated him on the patient's condition. She is taken medially to MRI completed by staff.   Final Clinical Impressions(s) / ED Diagnoses   Final diagnoses:  Dyspnea, unspecified type  Malignant neoplasm of right lung, unspecified part of lung [facility])    New Prescriptions New Prescriptions   No medications on file     Tanna Furry, MD 07/22/16 1706

## 2016-07-23 DIAGNOSIS — J9811 Atelectasis: Secondary | ICD-10-CM

## 2016-07-23 DIAGNOSIS — R0600 Dyspnea, unspecified: Secondary | ICD-10-CM

## 2016-07-23 DIAGNOSIS — G459 Transient cerebral ischemic attack, unspecified: Secondary | ICD-10-CM

## 2016-07-23 DIAGNOSIS — J90 Pleural effusion, not elsewhere classified: Secondary | ICD-10-CM

## 2016-07-23 DIAGNOSIS — R531 Weakness: Secondary | ICD-10-CM

## 2016-07-23 DIAGNOSIS — C3491 Malignant neoplasm of unspecified part of right bronchus or lung: Secondary | ICD-10-CM

## 2016-07-23 DIAGNOSIS — C3411 Malignant neoplasm of upper lobe, right bronchus or lung: Secondary | ICD-10-CM

## 2016-07-23 DIAGNOSIS — J9621 Acute and chronic respiratory failure with hypoxia: Secondary | ICD-10-CM

## 2016-07-23 LAB — GLUCOSE, CAPILLARY
GLUCOSE-CAPILLARY: 322 mg/dL — AB (ref 65–99)
GLUCOSE-CAPILLARY: 304 mg/dL — AB (ref 65–99)
Glucose-Capillary: 143 mg/dL — ABNORMAL HIGH (ref 65–99)
Glucose-Capillary: 358 mg/dL — ABNORMAL HIGH (ref 65–99)
Glucose-Capillary: 251 mg/dL — ABNORMAL HIGH (ref 65–99)

## 2016-07-23 LAB — COMPREHENSIVE METABOLIC PANEL
ALT: 16 U/L (ref 14–54)
AST: 20 U/L (ref 15–41)
Albumin: 3.5 g/dL (ref 3.5–5.0)
Alkaline Phosphatase: 61 U/L (ref 38–126)
Anion gap: 8 (ref 5–15)
BILIRUBIN TOTAL: 1.2 mg/dL (ref 0.3–1.2)
BUN: 17 mg/dL (ref 6–20)
CALCIUM: 9.1 mg/dL (ref 8.9–10.3)
CHLORIDE: 105 mmol/L (ref 101–111)
CO2: 25 mmol/L (ref 22–32)
CREATININE: 0.75 mg/dL (ref 0.44–1.00)
Glucose, Bld: 196 mg/dL — ABNORMAL HIGH (ref 65–99)
Potassium: 4.7 mmol/L (ref 3.5–5.1)
Sodium: 138 mmol/L (ref 135–145)
TOTAL PROTEIN: 6.8 g/dL (ref 6.5–8.1)

## 2016-07-23 LAB — CBC
HEMATOCRIT: 38.1 % (ref 36.0–46.0)
HEMOGLOBIN: 11.4 g/dL — AB (ref 12.0–15.0)
MCH: 24.8 pg — AB (ref 26.0–34.0)
MCHC: 29.9 g/dL — AB (ref 30.0–36.0)
MCV: 83 fL (ref 78.0–100.0)
Platelets: 193 10*3/uL (ref 150–400)
RBC: 4.59 MIL/uL (ref 3.87–5.11)
RDW: 17.9 % — ABNORMAL HIGH (ref 11.5–15.5)
WBC: 5.7 10*3/uL (ref 4.0–10.5)

## 2016-07-23 MED ORDER — INSULIN ASPART 100 UNIT/ML ~~LOC~~ SOLN
10.0000 [IU] | Freq: Three times a day (TID) | SUBCUTANEOUS | Status: DC
  Administered 2016-07-23: 10 [IU] via SUBCUTANEOUS

## 2016-07-23 MED ORDER — POLYETHYLENE GLYCOL 3350 17 G PO PACK
17.0000 g | PACK | Freq: Every day | ORAL | Status: DC
  Administered 2016-07-23 – 2016-07-24 (×2): 17 g via ORAL
  Filled 2016-07-23 (×2): qty 1

## 2016-07-23 MED ORDER — BISACODYL 10 MG RE SUPP: 10.0000 mg | Freq: Every day | RECTAL | Status: DC | PRN

## 2016-07-23 MED ORDER — VANCOMYCIN HCL 10 G IV SOLR
1250.0000 mg | Freq: Two times a day (BID) | INTRAVENOUS | Status: DC
  Filled 2016-07-23: qty 1250

## 2016-07-23 MED ORDER — NYSTATIN 100000 UNIT/GM EX POWD
Freq: Three times a day (TID) | CUTANEOUS | Status: DC
  Administered 2016-07-23 – 2016-07-25 (×6): via TOPICAL
  Filled 2016-07-23: qty 15

## 2016-07-23 MED ORDER — INSULIN DETEMIR 100 UNIT/ML ~~LOC~~ SOLN
30.0000 [IU] | Freq: Two times a day (BID) | SUBCUTANEOUS | Status: DC
  Administered 2016-07-23: 30 [IU] via SUBCUTANEOUS
  Filled 2016-07-23 (×2): qty 0.3

## 2016-07-23 MED ORDER — INSULIN ASPART 100 UNIT/ML ~~LOC~~ SOLN
5.0000 [IU] | Freq: Three times a day (TID) | SUBCUTANEOUS | Status: DC
  Administered 2016-07-23: 5 [IU] via SUBCUTANEOUS

## 2016-07-23 MED ORDER — SENNA 8.6 MG PO TABS
2.0000 | ORAL_TABLET | Freq: Every day | ORAL | Status: DC
  Administered 2016-07-23: 17.2 mg via ORAL
  Filled 2016-07-23 (×2): qty 2

## 2016-07-23 MED ORDER — OXYCODONE HCL 5 MG PO TABS
10.0000 mg | ORAL_TABLET | ORAL | Status: DC | PRN
  Administered 2016-07-23 – 2016-07-25 (×5): 10 mg via ORAL
  Filled 2016-07-23 (×5): qty 2

## 2016-07-23 MED ORDER — METOPROLOL TARTRATE 25 MG PO TABS
12.5000 mg | ORAL_TABLET | Freq: Two times a day (BID) | ORAL | Status: DC
  Administered 2016-07-23 – 2016-07-25 (×5): 12.5 mg via ORAL
  Filled 2016-07-23 (×6): qty 1

## 2016-07-23 NOTE — Care Management Note (Signed)
Case Management Note  Patient Details  Name: [name] MRN: [id number] Date of Birth: August 17, 1946  Subjective/Objective:       copd             Action/Plan: from home   Expected Discharge Date:   (unknown)               Expected Discharge Plan:  Home/Self Care  In-House Referral:     Discharge planning Services     Post Acute Care Choice:    Choice offered to:     DME Arranged:    DME Agency:     HH Arranged:    [place] Agency:     Status of Service:  In process, will continue to follow  If discussed at Long Length of Stay Meetings, dates discussed:    Additional Comments:Date:  July 23, 2016 Chart reviewed for concurrent status and case management needs. Will continue to follow the patient for status change: Discharge Planning: following for needs  Will follow for medication resource needs. Expected discharge date: [id number] Velva Harman, BSN, [place], [place]  Leeroy Cha, RN 07/23/2016, 10:10 AM

## 2016-07-23 NOTE — Progress Notes (Addendum)
PROGRESS NOTE  [name]  JHE:[id number] DOB: February 01, 1946 DOA: 07/22/2016 PCP: [name], MD  Brief Narrative:   [name] is a 70 y.o. 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  CT scan of her chest ordered by her oncologist on 07/18/16 revealed progression of metastatic lung cancer as evidenced by extensive collapse/consolidation throughout the right lung and a small loculated right pleural effusion and new/enlarging mediastinal/upper abdominal lymph nodes.  She was admitted to the stepdown unit for respiratory distress and started on steroids and antibiotics for possible loculated effusion.     Assessment & Plan:   Principal Problem:   Collapse of right lung Active Problems:   Type II diabetes mellitus (HCC)   Tobacco abuse   Morbid obesity (HCC)   Acute on chronic respiratory failure with hypoxia (HCC)   Primary cancer of right lung (HCC)   Loculated pleural effusion  Acute on chronic respiratory failure with hypoxia secondary to progression of metastatic lung cancer, possible COPD exacerbation or post-obstructive pneumonia.  Effusion is likely malignant and is small.  Patient has been afebrile and without leukocytosis.  -  Patient has not required BiPAP and last 24 hours and feels clinically improved. Transfer to telemetry -  Appreciate oncology assistance -  Continue Solu-Medrol at current dose -  Continue bronchodilators -  Continue Breo -  Continue broad spectrum antibiotics, plan to narrow to zosyn and taper to oral augmentin to complete a 7-10 day course  TIA, symptoms have completely resolved -  MRI brain  negative for stroke, no evidence of metastases -  CT angiogram of the head and neck demonstrated moderate ICA stenosis, but less than 70% -  Echocardiogram for completeness -  Patient is already on aspirin and Plavix for her CAD -  Lipid panel and A1c -  Given progression of her metastatic disease, unclear benefit of statin medication at this time -  Continue neuro checks  Primary cancer of the right lung, previously in remission for the last 8 years -  Appreciate Dr. Worthy Flank assistance -  DO NOT RESUSCITATE -  Pain control: Patient reports oxycodone 10 mg approximately twice a day has effectively treated her right chest pain related to her malignancy -  Start MiraLAX, senna, when necessary bisacodyl  Insulin-dependent diabetes mellitus 2, hyperglycemia secondary to reduced insulin in the setting of steroids -  Increase Levemir to 30 units twice daily -  Continue moderate dose sliding scale insulin -  Add standing aspart with meals -  Frequent CBGs to adjust insulin rapidly  CAD, chest pain atypical and related to malignancy - continue aspirin, plavix, statin  Ongoing tobacco abuse, encouraged cessation   DVT prophylaxis:  lovenox Code Status:  DO NOT RESUSCITATE Family Communication:  Patient's questions were answered, no family at bedside Disposition Plan:  Home pending further improvement in respiratory distress, tolerating oral medications. Anticipate that she may be a candidate for hospice pending conversations with Dr. Julien Nordmann.   Consultants:   Dr. Julien Nordmann, oncology  Procedures:  None  Antimicrobials:   Vancomycin 10/17  Zosyn 10/17    Subjective: Mild improvement in her dyspnea although still [name] of breath at rest in bed. Feeling hungry  this morning. Was only given liquid diet yesterday. Bowels have not been moving regularly. Having some intermittent nausea and poor appetite at home however she was forcing herself to eat to reduce her weight loss. Rash in the  intertriginous region of her abdomen which has a foul odor. Moderate to severe intermittent stabbing right chest pain with associated SOB has been improved by taking her mother's oxycodone 10 mg twice a day at home.    Objective: Vitals:   07/23/16 0800 07/23/16 1000 07/23/16 1013 07/23/16 1200  BP: (!) 102/53 118/60  125/61  Pulse: 93 (!) 109  (!) 101  Resp: (!) 23 (!) 21  (!) 23  Temp: 97.9 F (36.6 C)   98.1 F (36.7 C)  TempSrc: Oral   Oral  SpO2: 97% 97% 95% 99%  Weight:      Height:        Intake/Output Summary (Last 24 hours) at 07/23/16 1237 Last data filed at 07/23/16 0900  Gross per 24 hour  Intake           953.42 ml  Output                0 ml  Net           953.42 ml   Filed Weights   07/22/16 1036  Weight: 93 kg (205 lb)    Examination:  General exam:  Adult female.  Mild SCM retractions at rest  HEENT:  NCAT, MMM Respiratory system:  Diminished sounds throughout the right lung fields, no focal rales. No wheezes or rhonchi  Cardiovascular system: Regular rate and rhythm, normal S1/S2. No murmurs, rubs, gallops or clicks.  Warm extremities Gastrointestinal system: Normal active bowel sounds, soft, nondistended, nontender. MSK:  Normal tone and bulk, no lower extremity edema Neuro:  Cranial nerves II through XII grossly intact, strength 5 out of 5 all extremities, sensation intact to light touch all extremities, normal speech   Data Reviewed: I have personally reviewed following labs and imaging studies  CBC:  Recent Labs Lab 07/22/16 1108 07/23/16 0315  WBC 5.0 5.7  NEUTROABS 3.2  --   HGB 11.0* 11.4*  HCT 37.0 38.1  MCV 83.3 83.0  PLT 164 025   Basic Metabolic Panel:  Recent Labs Lab 07/22/16 1108 07/23/16 0315  NA 139 138  K 3.8 4.7  CL 103 105  CO2 28 25  GLUCOSE 145* 196*  BUN 16 17  CREATININE 0.77 0.75  CALCIUM 8.8* 9.1   GFR: Estimated Creatinine Clearance: 69.5 mL/min (by C-G formula based on SCr of 0.75 mg/dL). Liver  Function Tests:  Recent Labs Lab 07/22/16 1108 07/23/16 0315  AST 18 20  ALT 13* 16  ALKPHOS 58 61  BILITOT 0.6 1.2  PROT 6.4* 6.8  ALBUMIN 3.4* 3.5   No results for input(s): LIPASE, AMYLASE in the last 168 hours. No results for input(s): AMMONIA in the last 168 hours. Coagulation Profile: No results for input(s): INR, PROTIME in the last 168 hours. Cardiac Enzymes:  Recent Labs Lab 07/22/16 1108  TROPONINI <0.03   BNP (last 3 results) No results for input(s): PROBNP in the last 8760 hours. HbA1C: No results for input(s): HGBA1C in the last 72 hours. CBG:  Recent Labs Lab 07/22/16 1526 07/22/16 1718 07/22/16 2232 07/23/16 0743 07/23/16 1209  GLUCAP 131* 119* 196* 143* 358*   Lipid Profile: No results for input(s): CHOL, HDL, LDLCALC, TRIG, CHOLHDL, LDLDIRECT in the last 72 hours. Thyroid Function Tests: No results  for input(s): TSH, T4TOTAL, FREET4, T3FREE, THYROIDAB in the last 72 hours. Anemia Panel: No results for input(s): VITAMINB12, FOLATE, FERRITIN, TIBC, IRON, RETICCTPCT in the last 72 hours. Urine analysis:    Component Value Date/Time   COLORURINE YELLOW 06/17/2016 1023   APPEARANCEUR CLEAR 06/17/2016 1023   LABSPEC 1.020 06/17/2016 1023   PHURINE 5.5 06/17/2016 1023   GLUCOSEU >1000 (A) 06/17/2016 1023   HGBUR NEGATIVE 06/17/2016 1023   BILIRUBINUR NEGATIVE 06/17/2016 1023   KETONESUR NEGATIVE 06/17/2016 1023   PROTEINUR NEGATIVE 06/17/2016 1023   UROBILINOGEN 0.2 06/07/2015 1122   NITRITE POSITIVE (A) 06/17/2016 1023   LEUKOCYTESUR NEGATIVE 06/17/2016 1023   Sepsis Labs: '@LABRCNTIP'$ (procalcitonin:4,lacticidven:4)  ) Recent Results (from the past 240 hour(s))  MRSA PCR Screening     Status: None   Collection Time: 07/22/16  6:05 PM  Result Value Ref Range Status   MRSA by PCR NEGATIVE NEGATIVE Final    Comment:        The GeneXpert MRSA Assay (FDA approved for NASAL specimens only), is one component of a comprehensive MRSA  colonization surveillance program. It is not intended to diagnose MRSA infection nor to guide or monitor treatment for MRSA infections.       Radiology Studies: Ct Angio Head W Or Wo Contrast  Result Date: 07/22/2016 CLINICAL DATA:  Right-sided facial droop EXAM: CT ANGIOGRAPHY HEAD AND NECK TECHNIQUE: Multidetector CT imaging of the head and neck was performed using the standard protocol during bolus administration of intravenous contrast. Multiplanar CT image reconstructions and MIPs were obtained to evaluate the vascular anatomy. Carotid stenosis measurements (when applicable) are obtained utilizing NASCET criteria, using the distal internal carotid diameter as the denominator. CONTRAST:  100 mL Isovue 370 IV COMPARISON:  Head CT 07/22/2016 FINDINGS: CTA NECK FINDINGS Aortic arch: There is a normal variant aortic arch branching pattern with the brachiocephalic and left common carotid arteries sharing a common origin. The visualized bilateral proximal subclavian arteries are normal. There is atherosclerotic calcification of the aortic arch. Right carotid system: There is atherosclerotic plaque at the right carotid bifurcation resulting in 60 percent stenosis. The remainder of the cervical right ICA is normal. Left carotid system: There is atherosclerotic plaque and calcification of the proximal left ICA resulting in 50 percent stenosis. Vertebral arteries: The right vertebral artery origin and V1 segment are not visualized. The diminutive right vertebral artery is 1st identified at the C3 level. The remainder of its V2 and V3 segments are normal. There is atherosclerotic calcification the narrows the lumen as it enters the foramen magnum, but a remains patent to the confluence with the basilar artery. The left vertebral artery is dominant and normal along its entire course. There is atherosclerotic calcification that narrows its origin. Skeleton: Anterior cervical fusion hardware at C5-6. Other neck:  Unremarkable Upper chest: There is complete opacification of the right upper lobe associated collapse and large pleural effusion. This is better characterized on recent chest CT. Review of the MIP images confirms the above findings CTA HEAD FINDINGS Anterior circulation: --Intracranial internal carotid arteries: There is atherosclerotic calcification of the proximal intracranial internal carotid artery is without greater than 50 percent stenosis. --Anterior cerebral arteries: Normal. --Middle cerebral arteries: Normal. --Posterior communicating arteries: Present bilaterally, larger on the right. Posterior circulation: --Posterior cerebral arteries: Fetal origin of the right PCA. Normal left PCA. --Superior cerebellar arteries: Normal. --Basilar artery: Normal. --Anterior inferior cerebellar arteries: Normal. --Posterior inferior cerebellar arteries: Normal. Venous sinuses: As permitted by contrast timing, patent. Anatomic variants:  None Delayed phase: Not performed Review of the MIP images confirms the above findings IMPRESSION: 1. No occlusion or high-grade stenosis of the intracranial arteries. 2. Bilateral proximal internal carotid artery stenosis, measuring 60 percent on the right and 50 percent on the left. 3. Diminutive right vertebral artery with lack of opacification below the C3 level. This may be congenital or secondary to chronic occlusion/stenosis. Electronically Signed   By: Ulyses Jarred M.D.   On: 07/22/2016 16:29   Dg Chest 2 View  Result Date: 07/22/2016 CLINICAL DATA:  Increased shortness of breath for 1 week. EXAM: CHEST  2 VIEW COMPARISON:  06/27/2016 and CT from 07/18/2016 FINDINGS: The heart size appears normal. There is near complete opacification of the entire right hemi thorax with a small focus of aerated lung in the right upper lung zone. There is subsegmental atelectasis within the left base. When compared with the previous chest radiograph there has been significantly diminished  aeration to the entire right hemi thorax. Today's imaging findings correlate with CT appearance of the right lung from 07/18/2016. IMPRESSION: 1. Markedly diminished aeration to the right lung when compared with 06/27/2016. Findings most likely reflect progression of lung cancer. Electronically Signed   By: Kerby Moors M.D.   On: 07/22/2016 12:05   Ct Angio Neck W Or Wo Contrast  Result Date: 07/22/2016 CLINICAL DATA:  Right-sided facial droop EXAM: CT ANGIOGRAPHY HEAD AND NECK TECHNIQUE: Multidetector CT imaging of the head and neck was performed using the standard protocol during bolus administration of intravenous contrast. Multiplanar CT image reconstructions and MIPs were obtained to evaluate the vascular anatomy. Carotid stenosis measurements (when applicable) are obtained utilizing NASCET criteria, using the distal internal carotid diameter as the denominator. CONTRAST:  100 mL Isovue 370 IV COMPARISON:  Head CT 07/22/2016 FINDINGS: CTA NECK FINDINGS Aortic arch: There is a normal variant aortic arch branching pattern with the brachiocephalic and left common carotid arteries sharing a common origin. The visualized bilateral proximal subclavian arteries are normal. There is atherosclerotic calcification of the aortic arch. Right carotid system: There is atherosclerotic plaque at the right carotid bifurcation resulting in 60 percent stenosis. The remainder of the cervical right ICA is normal. Left carotid system: There is atherosclerotic plaque and calcification of the proximal left ICA resulting in 50 percent stenosis. Vertebral arteries: The right vertebral artery origin and V1 segment are not visualized. The diminutive right vertebral artery is 1st identified at the C3 level. The remainder of its V2 and V3 segments are normal. There is atherosclerotic calcification the narrows the lumen as it enters the foramen magnum, but a remains patent to the confluence with the basilar artery. The left vertebral  artery is dominant and normal along its entire course. There is atherosclerotic calcification that narrows its origin. Skeleton: Anterior cervical fusion hardware at C5-6. Other neck: Unremarkable Upper chest: There is complete opacification of the right upper lobe associated collapse and large pleural effusion. This is better characterized on recent chest CT. Review of the MIP images confirms the above findings CTA HEAD FINDINGS Anterior circulation: --Intracranial internal carotid arteries: There is atherosclerotic calcification of the proximal intracranial internal carotid artery is without greater than 50 percent stenosis. --Anterior cerebral arteries: Normal. --Middle cerebral arteries: Normal. --Posterior communicating arteries: Present bilaterally, larger on the right. Posterior circulation: --Posterior cerebral arteries: Fetal origin of the right PCA. Normal left PCA. --Superior cerebellar arteries: Normal. --Basilar artery: Normal. --Anterior inferior cerebellar arteries: Normal. --Posterior inferior cerebellar arteries: Normal. Venous sinuses: As permitted  by contrast timing, patent. Anatomic variants: None Delayed phase: Not performed Review of the MIP images confirms the above findings IMPRESSION: 1. No occlusion or high-grade stenosis of the intracranial arteries. 2. Bilateral proximal internal carotid artery stenosis, measuring 60 percent on the right and 50 percent on the left. 3. Diminutive right vertebral artery with lack of opacification below the C3 level. This may be congenital or secondary to chronic occlusion/stenosis. Electronically Signed   By: Ulyses Jarred M.D.   On: 07/22/2016 16:29   Mr Brain Wo Contrast  Result Date: 07/22/2016 CLINICAL DATA:  Shortness of breath and right-sided facial droop. EXAM: MRI HEAD WITHOUT CONTRAST TECHNIQUE: Multiplanar, multiecho pulse sequences of the brain and surrounding structures were obtained without intravenous contrast. COMPARISON:  Head CT  07/22/2016 and brain MRI 11/15/2008 FINDINGS: The examination had to be discontinued prior to completion due to the patient's inability to tolerate further imaging. Brain: No acute infarct or intraparenchymal hemorrhage. The midline structures are normal. No focal parenchymal signal abnormality. No mass lesion or midline shift. No hydrocephalus or extra-axial fluid collection. Vascular: Major intracranial arterial and venous sinus flow voids are preserved. No evidence of chronic microhemorrhage or amyloid angiopathy. Skull and upper cervical spine: The visualized skull base, calvarium, upper cervical spine and extracranial soft tissues are normal. Sinuses/Orbits: No fluid levels or advanced mucosal thickening. No mastoid effusion. Normal orbits. IMPRESSION: Normal brain MRI for age. Electronically Signed   By: Ulyses Jarred M.D.   On: 07/22/2016 17:23   Ct Head Code Stroke Wo Contrast  Addendum Date: 07/22/2016   ADDENDUM REPORT: 07/22/2016 15:59 ADDENDUM: Study discussed by telephone with Dr. Jeneen Rinks On 07/22/2016 at 1555 hours. Electronically Signed   By: Genevie Ann M.D.   On: 07/22/2016 15:59   Result Date: 07/22/2016 CLINICAL DATA:  Code stroke. 70 year old female with sudden change in mental status, left facial droop and left extremity weakness. Initial encounter. EXAM: CT HEAD WITHOUT CONTRAST TECHNIQUE: Contiguous axial images were obtained from the base of the skull through the vertex without intravenous contrast. COMPARISON:  Head CTs without contrast 11/27/2012 and earlier. FINDINGS: Brain: Cerebral volume is within normal limits for age. Stable and normal Stable gray-white matter differentiation throughout the brain. No midline shift, ventriculomegaly, mass effect, evidence of mass lesion, intracranial hemorrhage or evidence of cortically based acute infarction. Vascular: Extensive Calcified atherosclerosis at the skull base. No suspicious intracranial vascular hyperdensity. Skull: No acute osseous  abnormality identified. Hyperostosis frontalis. Sinuses/Orbits: Visualized paranasal sinuses and mastoids are stable and well pneumatized. Other: No acute orbit or scalp soft tissue finding. ASPECTS ([place] Stroke Program Early CT Score) - Ganglionic level infarction (caudate, lentiform nuclei, internal capsule, insula, M1-M3 cortex): 7 - Supraganglionic infarction (M4-M6 cortex): 3 Total score (0-10 with 10 being normal): 10 IMPRESSION: 1. Stable and normal for age noncontrast CT appearance of the brain. 2. ASPECTS is 10. Electronically Signed: By: Genevie Ann M.D. On: 07/22/2016 15:48     Scheduled Meds: . aspirin EC  81 mg Oral Daily  . clopidogrel  75 mg Oral Q breakfast  . enoxaparin (LOVENOX) injection  40 mg Subcutaneous QHS  . fluticasone furoate-vilanterol  1 puff Inhalation Daily  . insulin aspart  0-15 Units Subcutaneous TID WC  . insulin aspart  0-5 Units Subcutaneous QHS  . insulin aspart  5 Units Subcutaneous TID WC  . insulin detemir  30 Units Subcutaneous BID  . levalbuterol  0.63 mg Nebulization TID  . methylPREDNISolone (SOLU-MEDROL) injection  40 mg Intravenous Q8H  .  metoprolol tartrate  12.5 mg Oral BID  . nicotine  21 mg Transdermal Daily  . nystatin   Topical TID  . pantoprazole  80 mg Oral Q1200  . piperacillin-tazobactam (ZOSYN)  IV  3.375 g Intravenous Q8H  . polyethylene glycol  17 g Oral Daily  . polyvinyl alcohol  1 drop Both Eyes BID  . pravastatin  40 mg Oral q1800  . pregabalin  50 mg Oral BID  . senna  2 tablet Oral QHS  . umeclidinium bromide  1 puff Inhalation Daily  . vancomycin  1,000 mg Intravenous Q12H   Continuous Infusions:    LOS: 1 day    Time spent: 30 min    Janece Canterbury, MD [facility] Pager [phone]  If 7PM-7AM, please contact night-coverage [email or web address] Password [email or web address] 07/23/2016, 12:37 PM

## 2016-07-23 NOTE — Progress Notes (Signed)
Initial Nutrition Assessment  DOCUMENTATION CODES:   Obesity unspecified  INTERVENTION:  - Continue to encourage PO intakes of meals. - RD will monitor for additional nutrition-related needs at follow-up.  NUTRITION DIAGNOSIS:   Increased nutrient needs related to catabolic illness, cancer and cancer related treatments as evidenced by estimated needs.  GOAL:   Patient will meet greater than or equal to 90% of their needs  MONITOR:   PO intake, Weight trends, Labs, I & O's  REASON FOR ASSESSMENT:   Malnutrition Screening Tool, Consult Assessment of nutrition requirement/status  ASSESSMENT:   70 y.o. 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 She has had shortness of breath both at rest and with activity. She has associated easy fatigability, right-sided pleuritic chest pain that radiates to her back, and generalized malaise. She continues to smoke half a pack of cigarettes per day. She has had approximately 5-10 pounds of unintentional weight loss over the past few weeks. Due to her progressive shortness of breath, her oncologist, Dr. Earlie Server, ordered an outpatient CT scan of her chest on 07/18/16. It revealed progression of metastatic lung cancer as evidenced by extensive collapse/consolidation throughout the right lung and a small loculated right pleural effusion and new/enlarging mediastinal/upper abdominal lymph nodes.  Pt seen for MST and consult. BMI indicates obesity. Pt reports 100% completion of fresh fruit cup, pancakes, bacon, and coffee for breakfast. She also ordered grits but did not eat them. She states that she was  feeling very hungry and that appetite had been slightly decreased over the past few weeks related to increasing SOB. She denies chewing or swallowing issues this AM but states she is feeling some pressure/tightness in lower sternal area; she states that Dr. Earlie Server informed her that this is the location of lymph nodes referenced in brief summary above.   Pt talks about discussion she had with Dr. Earlie Server this AM concerning potential plan of treatment, about her past treatment course, and about her late husband. Active listening provided. Tech entered the room to give patient a bath and two family members also arrived at that time. Will need to obtain further information at follow-up.  No muscle or fat wasting noted at this time. Per chart review, pt has lost 7 lbs (3.3% body weight) in the past 3 months which is not significant for time frame. Of this, 3 lbs (1.4% body weight) has been in the past 1 month which is also not significant for time frame.  Medications reviewed; sliding scale Novolog, 15 units Levemir BID, 40 mg IV  Solu-medrol TID, PRN Zofran, 80 mg Protonix/day, 17 g Miralax/day, 2 tablets Senokot/day.  Labs reviewed; CBG: 143 mg/dL.   Diet Order:  Diet regular Room service appropriate? Yes; Fluid consistency: Thin  Skin:  Reviewed, no issues  Last BM:  PTA  Height:   Ht Readings from Last 1 Encounters:  07/22/16 '5\' 2"'$  (1.575 m)    Weight:   Wt Readings from Last 1 Encounters:  07/22/16 205 lb (93 kg)    Ideal Body Weight:  50 kg  BMI:  Body mass index is 37.49 kg/m.  Estimated Nutritional Needs:   Kcal:  4098-1191 (30-33 kcal/kg)  Protein:  85-95 grams  Fluid:  1.8-2 L/day  EDUCATION NEEDS:   No education needs identified at this time    Jarome Matin, MS, RD, LDN Inpatient Clinical Dietitian Pager # [phone] After hours/weekend pager # [phone]

## 2016-07-23 NOTE — Progress Notes (Signed)
Pharmacy Antibiotic Note  [name] is a 70 y.o. female admitted on 07/22/2016 with hypoxic respiratory failure secondary to progression of metastatic lung cancer, possible COPD exacerbation, and possible infected loculated pleural effusion.  Pharmacy has been consulted for Vancomycin and Zosyn dosing.  Afebrile, WBC remains WNL SCr 0.75 with CrCl ~ 69 ml/min   Plan:  Continue Zosyn 3.375g IV Q8H infused over 4hrs.  Increase to Vancomycin 1250 mg IV q12h.   Measure Vanc trough at steady state if continued.  Follow up renal fxn, culture results, narrowing antibiotics, and clinical course.  Height: '5\' 2"'$  (157.5 cm) Weight: 205 lb (93 kg) IBW/kg (Calculated) : 50.1  Temp (24hrs), Avg:97.6 F (36.4 C), Min:97.3 F (36.3 C), Max:98.1 F (36.7 C)   Recent Labs Lab 07/22/16 1108 07/23/16 0315  WBC 5.0 5.7  CREATININE 0.77 0.75    Estimated Creatinine Clearance: 69.5 mL/min (by C-G formula based on SCr of 0.75 mg/dL).    Allergies  Allergen Reactions  . Celebrex [Celecoxib] Palpitations and Other (See Comments)    Chest pain, tachycardia, diaphoresis  . Niacin Rash  . Percocet [Oxycodone-Acetaminophen] Palpitations    Update 07/23/16 - pt reports racing heart with Percocet, but has tolerated oxycodone.  . Varenicline Tartrate Rash    Reaction to CHANTIX    Antimicrobials this admission: Vancomycin 10/17 >>  Zosyn 10/17 >>   Dose adjustments this admission: 10/18 Vancomycin dosing increased per nomogram for weight and renal function.  Microbiology results: 10/17 MRSA PCR: negative  Thank you for allowing pharmacy to be a part of this patient's care.  Gretta Arab PharmD, BCPS Pager [phone] 07/23/2016 12:51 PM

## 2016-07-23 NOTE — Progress Notes (Signed)
DIAGNOSIS: Stage IIIA (T2a N2 MX) non-small cell lung cancer, adenocarcinoma, diagnosed in October 2009.   PRIOR THERAPY: :  1. Status post right upper lobectomy with lymph node dissection under the care of Dr. Arlyce Dice on October 12, 2008. 2. Status post 4 cycles of adjuvant chemotherapy with cisplatin and docetaxel given every 3 weeks with Neulasta support. Last dose was given on January 23, 2009. 3. Status post adjuvant radiotherapy to the mediastinum. The patient received a total dose of 5040 cGy between March 06, 2009, through April 07, 2009, under the care of Dr. Pablo Ledger.  CURRENT THERAPY: Observation.   Subjective: The patient is seen and examined today. She was admitted yesterday with worsening dyspnea, fatigue as well as pain on the right side of the chest. She is feeling a little bit better today after being transferred to [facility]. A recent CT scan of the chest showed evidence for disease progression with extensive collapse/consolidation throughout the right lung as well as a small loculated right pleural effusion and enlarging mediastinal/upper abdominal lymph nodes. She was supposed to come to the clinic in 2 days for discussion of her treatment options but the patient has worsening of her dyspnea and she was admitted to the hospital. She also has a change in her mental status with left facial droop and left extremity weakness. CT scan of the head followed by MRI of the brain was performed yesterday and showed no significant abnormalities. She denied having any fever or chills today. She continues to have the right-sided chest pain as well as the shortness breath with cough.   Objective: Vital signs in last 24 hours: Temp:  [97.3 F (36.3 C)-98.1 F (36.7 C)] 98 F (36.7 C) (10/18 1300) Pulse Rate:  [87-112] 105 (10/18 1414) Resp:  [16-24] 18 (10/18 1414) BP: (102-157)/(53-80) 126/55 (10/18 1300) SpO2:  [94 %-99 %] 96 % (10/18 1414)  Intake/Output from previous  day: 10/17 0701 - 10/18 0700 In: 513.4 [I.V.:213.4; IV Piggyback:300] Out: -  Intake/Output this shift: Total I/O In: 490 [P.O.:240; IV Piggyback:250] Out: 500 [Urine:500]  General appearance: alert, cooperative, fatigued and mild distress Resp: diminished breath sounds RML and RUL and dullness to percussion RML and RUL Cardio: regular rate and rhythm, S1, S2 normal, no murmur, click, rub or gallop GI: soft, non-tender; bowel sounds normal; no masses,  no organomegaly Extremities: extremities normal, atraumatic, no cyanosis or edema  Lab Results:   Recent Labs  07/22/16 1108 07/23/16 0315  WBC 5.0 5.7  HGB 11.0* 11.4*  HCT 37.0 38.1  PLT 164 193   BMET  Recent Labs  07/22/16 1108 07/23/16 0315  NA 139 138  K 3.8 4.7  CL 103 105  CO2 28 25  GLUCOSE 145* 196*  BUN 16 17  CREATININE 0.77 0.75  CALCIUM 8.8* 9.1    Studies/Results: Ct Angio Head W Or Wo Contrast  Result Date: 07/22/2016 CLINICAL DATA:  Right-sided facial droop EXAM: CT ANGIOGRAPHY HEAD AND NECK TECHNIQUE: Multidetector CT imaging of the head and neck was performed using the standard protocol during bolus administration of intravenous contrast. Multiplanar CT image reconstructions and MIPs were obtained to evaluate the vascular anatomy. Carotid stenosis measurements (when applicable) are obtained utilizing NASCET criteria, using the distal internal carotid diameter as the denominator. CONTRAST:  100 mL Isovue 370 IV COMPARISON:  Head CT 07/22/2016 FINDINGS: CTA NECK FINDINGS Aortic arch: There is a normal variant aortic arch branching pattern with the brachiocephalic and left common carotid arteries sharing a  common origin. The visualized bilateral proximal subclavian arteries are normal. There is atherosclerotic calcification of the aortic arch. Right carotid system: There is atherosclerotic plaque at the right carotid bifurcation resulting in 60 percent stenosis. The remainder of the cervical right ICA is  normal. Left carotid system: There is atherosclerotic plaque and calcification of the proximal left ICA resulting in 50 percent stenosis. Vertebral arteries: The right vertebral artery origin and V1 segment are not visualized. The diminutive right vertebral artery is 1st identified at the C3 level. The remainder of its V2 and V3 segments are normal. There is atherosclerotic calcification the narrows the lumen as it enters the foramen magnum, but a remains patent to the confluence with the basilar artery. The left vertebral artery is dominant and normal along its entire course. There is atherosclerotic calcification that narrows its origin. Skeleton: Anterior cervical fusion hardware at C5-6. Other neck: Unremarkable Upper chest: There is complete opacification of the right upper lobe associated collapse and large pleural effusion. This is better characterized on recent chest CT. Review of the MIP images confirms the above findings CTA HEAD FINDINGS Anterior circulation: --Intracranial internal carotid arteries: There is atherosclerotic calcification of the proximal intracranial internal carotid artery is without greater than 50 percent stenosis. --Anterior cerebral arteries: Normal. --Middle cerebral arteries: Normal. --Posterior communicating arteries: Present bilaterally, larger on the right. Posterior circulation: --Posterior cerebral arteries: Fetal origin of the right PCA. Normal left PCA. --Superior cerebellar arteries: Normal. --Basilar artery: Normal. --Anterior inferior cerebellar arteries: Normal. --Posterior inferior cerebellar arteries: Normal. Venous sinuses: As permitted by contrast timing, patent. Anatomic variants: None Delayed phase: Not performed Review of the MIP images confirms the above findings IMPRESSION: 1. No occlusion or high-grade stenosis of the intracranial arteries. 2. Bilateral proximal internal carotid artery stenosis, measuring 60 percent on the right and 50 percent on the left. 3.  Diminutive right vertebral artery with lack of opacification below the C3 level. This may be congenital or secondary to chronic occlusion/stenosis. Electronically Signed   By: Ulyses Jarred M.D.   On: 07/22/2016 16:29   Dg Chest 2 View  Result Date: 07/22/2016 CLINICAL DATA:  Increased shortness of breath for 1 week. EXAM: CHEST  2 VIEW COMPARISON:  06/27/2016 and CT from 07/18/2016 FINDINGS: The heart size appears normal. There is near complete opacification of the entire right hemi thorax with a small focus of aerated lung in the right upper lung zone. There is subsegmental atelectasis within the left base. When compared with the previous chest radiograph there has been significantly diminished aeration to the entire right hemi thorax. Today's imaging findings correlate with CT appearance of the right lung from 07/18/2016. IMPRESSION: 1. Markedly diminished aeration to the right lung when compared with 06/27/2016. Findings most likely reflect progression of lung cancer. Electronically Signed   By: Kerby Moors M.D.   On: 07/22/2016 12:05   Ct Angio Neck W Or Wo Contrast  Result Date: 07/22/2016 CLINICAL DATA:  Right-sided facial droop EXAM: CT ANGIOGRAPHY HEAD AND NECK TECHNIQUE: Multidetector CT imaging of the head and neck was performed using the standard protocol during bolus administration of intravenous contrast. Multiplanar CT image reconstructions and MIPs were obtained to evaluate the vascular anatomy. Carotid stenosis measurements (when applicable) are obtained utilizing NASCET criteria, using the distal internal carotid diameter as the denominator. CONTRAST:  100 mL Isovue 370 IV COMPARISON:  Head CT 07/22/2016 FINDINGS: CTA NECK FINDINGS Aortic arch: There is a normal variant aortic arch branching pattern with the brachiocephalic and  left common carotid arteries sharing a common origin. The visualized bilateral proximal subclavian arteries are normal. There is atherosclerotic calcification  of the aortic arch. Right carotid system: There is atherosclerotic plaque at the right carotid bifurcation resulting in 60 percent stenosis. The remainder of the cervical right ICA is normal. Left carotid system: There is atherosclerotic plaque and calcification of the proximal left ICA resulting in 50 percent stenosis. Vertebral arteries: The right vertebral artery origin and V1 segment are not visualized. The diminutive right vertebral artery is 1st identified at the C3 level. The remainder of its V2 and V3 segments are normal. There is atherosclerotic calcification the narrows the lumen as it enters the foramen magnum, but a remains patent to the confluence with the basilar artery. The left vertebral artery is dominant and normal along its entire course. There is atherosclerotic calcification that narrows its origin. Skeleton: Anterior cervical fusion hardware at C5-6. Other neck: Unremarkable Upper chest: There is complete opacification of the right upper lobe associated collapse and large pleural effusion. This is better characterized on recent chest CT. Review of the MIP images confirms the above findings CTA HEAD FINDINGS Anterior circulation: --Intracranial internal carotid arteries: There is atherosclerotic calcification of the proximal intracranial internal carotid artery is without greater than 50 percent stenosis. --Anterior cerebral arteries: Normal. --Middle cerebral arteries: Normal. --Posterior communicating arteries: Present bilaterally, larger on the right. Posterior circulation: --Posterior cerebral arteries: Fetal origin of the right PCA. Normal left PCA. --Superior cerebellar arteries: Normal. --Basilar artery: Normal. --Anterior inferior cerebellar arteries: Normal. --Posterior inferior cerebellar arteries: Normal. Venous sinuses: As permitted by contrast timing, patent. Anatomic variants: None Delayed phase: Not performed Review of the MIP images confirms the above findings IMPRESSION: 1. No  occlusion or high-grade stenosis of the intracranial arteries. 2. Bilateral proximal internal carotid artery stenosis, measuring 60 percent on the right and 50 percent on the left. 3. Diminutive right vertebral artery with lack of opacification below the C3 level. This may be congenital or secondary to chronic occlusion/stenosis. Electronically Signed   By: Ulyses Jarred M.D.   On: 07/22/2016 16:29   Mr Brain Wo Contrast  Result Date: 07/22/2016 CLINICAL DATA:  Shortness of breath and right-sided facial droop. EXAM: MRI HEAD WITHOUT CONTRAST TECHNIQUE: Multiplanar, multiecho pulse sequences of the brain and surrounding structures were obtained without intravenous contrast. COMPARISON:  Head CT 07/22/2016 and brain MRI 11/15/2008 FINDINGS: The examination had to be discontinued prior to completion due to the patient's inability to tolerate further imaging. Brain: No acute infarct or intraparenchymal hemorrhage. The midline structures are normal. No focal parenchymal signal abnormality. No mass lesion or midline shift. No hydrocephalus or extra-axial fluid collection. Vascular: Major intracranial arterial and venous sinus flow voids are preserved. No evidence of chronic microhemorrhage or amyloid angiopathy. Skull and upper cervical spine: The visualized skull base, calvarium, upper cervical spine and extracranial soft tissues are normal. Sinuses/Orbits: No fluid levels or advanced mucosal thickening. No mastoid effusion. Normal orbits. IMPRESSION: Normal brain MRI for age. Electronically Signed   By: Ulyses Jarred M.D.   On: 07/22/2016 17:23   Ct Head Code Stroke Wo Contrast  Addendum Date: 07/22/2016   ADDENDUM REPORT: 07/22/2016 15:59 ADDENDUM: Study discussed by telephone with Dr. Jeneen Rinks On 07/22/2016 at 1555 hours. Electronically Signed   By: Genevie Ann M.D.   On: 07/22/2016 15:59   Result Date: 07/22/2016 CLINICAL DATA:  Code stroke. 70 year old female with sudden change in mental status, left facial  droop and left extremity weakness.  Initial encounter. EXAM: CT HEAD WITHOUT CONTRAST TECHNIQUE: Contiguous axial images were obtained from the base of the skull through the vertex without intravenous contrast. COMPARISON:  Head CTs without contrast 11/27/2012 and earlier. FINDINGS: Brain: Cerebral volume is within normal limits for age. Stable and normal Stable gray-white matter differentiation throughout the brain. No midline shift, ventriculomegaly, mass effect, evidence of mass lesion, intracranial hemorrhage or evidence of cortically based acute infarction. Vascular: Extensive Calcified atherosclerosis at the skull base. No suspicious intracranial vascular hyperdensity. Skull: No acute osseous abnormality identified. Hyperostosis frontalis. Sinuses/Orbits: Visualized paranasal sinuses and mastoids are stable and well pneumatized. Other: No acute orbit or scalp soft tissue finding. ASPECTS ([place] Stroke Program Early CT Score) - Ganglionic level infarction (caudate, lentiform nuclei, internal capsule, insula, M1-M3 cortex): 7 - Supraganglionic infarction (M4-M6 cortex): 3 Total score (0-10 with 10 being normal): 10 IMPRESSION: 1. Stable and normal for age noncontrast CT appearance of the brain. 2. ASPECTS is 10. Electronically Signed: By: Genevie Ann M.D. On: 07/22/2016 15:48    Medications: I have reviewed the patient's current medications.  CODE STATUS: No CODE BLUE  Assessment/Plan: 1) recurrent non-small cell lung cancer presented with progressive disease in the right lung with consolidation and collapse of the right upper and middle lobe as well as loculated right pleural effusion. I had a lengthy discussion with the patient today about her condition. The patient received radiotherapy to the mediastinum in the past but this was ears ago. I will consult radiation oncology to evaluate the patient for possible palliative radiotherapy to the obstructing right lung mass. The patient may be candidate for  systemic chemotherapy or immunotherapy after the palliative radiation. Her prognosis is also very poor and she may be a candidate for palliative care and hospice if she declined any further treatment. 2) postobstructive pneumonia of the right lung: The patient is currently on Zosyn. We'll continue with the current medication for now. May consider switching to Levaquin before discharge. 3) COPD exacerbation: Continue current treatment with Solu-Medrol as well as inhalers. 4) CODE STATUS: No CODE BLUE. Please consider the patient for a palliative care consult to discuss goals of care. Thank you for taking good care of Ms. kanaan I will continue to follow up the patient with you and assist in her management on as-needed basis.   LOS: 1 day    Randen Kauth K. 07/23/2016

## 2016-07-24 ENCOUNTER — Ambulatory Visit
Admit: 2016-07-24 | Discharge: 2016-07-24 | Disposition: A | Discharge status: Home / Self Care | Payer: Medicare Other | Attending: Radiation Oncology | Admitting: Radiation Oncology

## 2016-07-24 ENCOUNTER — Telehealth: Payer: Self-pay | Admitting: Oncology

## 2016-07-24 ENCOUNTER — Inpatient Hospital Stay (HOSPITAL_COMMUNITY): Payer: Medicare Other

## 2016-07-24 ENCOUNTER — Ambulatory Visit
Admission: RE | Admit: 2016-07-24 | Discharge: 2016-07-24 | Disposition: A | Discharge status: Home / Self Care | Payer: Medicare Other | Source: Ambulatory Visit | Attending: Radiation Oncology | Admitting: Radiation Oncology

## 2016-07-24 DIAGNOSIS — C3481 Malignant neoplasm of overlapping sites of right bronchus and lung: Secondary | ICD-10-CM

## 2016-07-24 DIAGNOSIS — C3491 Malignant neoplasm of unspecified part of right bronchus or lung: Secondary | ICD-10-CM

## 2016-07-24 DIAGNOSIS — C3411 Malignant neoplasm of upper lobe, right bronchus or lung: Secondary | ICD-10-CM

## 2016-07-24 DIAGNOSIS — G459 Transient cerebral ischemic attack, unspecified: Secondary | ICD-10-CM

## 2016-07-24 LAB — BASIC METABOLIC PANEL
ANION GAP: 6 (ref 5–15)
BUN: 28 mg/dL — ABNORMAL HIGH (ref 6–20)
CALCIUM: 9.2 mg/dL (ref 8.9–10.3)
CO2: 27 mmol/L (ref 22–32)
Chloride: 105 mmol/L (ref 101–111)
Creatinine, Ser: 0.82 mg/dL (ref 0.44–1.00)
GFR calc Af Amer: >60 mL/min (ref >60)
GFR calc non Af Amer: >60 mL/min (ref >60)
GLUCOSE: 235 mg/dL — AB (ref 65–99)
Potassium: 5.6 mmol/L — ABNORMAL HIGH (ref 3.5–5.1)
Sodium: 138 mmol/L (ref 135–145)

## 2016-07-24 LAB — CBC
HCT: 36.4 % (ref 36.0–46.0)
HEMOGLOBIN: 10.9 g/dL — AB (ref 12.0–15.0)
MCH: 24.6 pg — AB (ref 26.0–34.0)
MCHC: 29.9 g/dL — AB (ref 30.0–36.0)
MCV: 82.2 fL (ref 78.0–100.0)
Platelets: 214 10*3/uL (ref 150–400)
RBC: 4.43 MIL/uL (ref 3.87–5.11)
RDW: 18.2 % — ABNORMAL HIGH (ref 11.5–15.5)
WBC: 9.5 10*3/uL (ref 4.0–10.5)

## 2016-07-24 LAB — LIPID PANEL
CHOLESTEROL: 153 mg/dL (ref 0–200)
HDL: 38 mg/dL — ABNORMAL LOW (ref >40)
LDL CALC: 93 mg/dL (ref 0–99)
Total CHOL/HDL Ratio: 4 RATIO
Triglycerides: 112 mg/dL (ref <150)
VLDL: 22 mg/dL (ref 0–40)

## 2016-07-24 LAB — GLUCOSE, CAPILLARY
GLUCOSE-CAPILLARY: 156 mg/dL — AB (ref 65–99)
GLUCOSE-CAPILLARY: 201 mg/dL — AB (ref 65–99)
GLUCOSE-CAPILLARY: 259 mg/dL — AB (ref 65–99)
Glucose-Capillary: 273 mg/dL — ABNORMAL HIGH (ref 65–99)
Glucose-Capillary: 217 mg/dL — ABNORMAL HIGH (ref 65–99)
Glucose-Capillary: 193 mg/dL — ABNORMAL HIGH (ref 65–99)

## 2016-07-24 LAB — ECHOCARDIOGRAM COMPLETE
HEIGHTINCHES: 62 in
WEIGHTICAEL: 3280 [oz_av]

## 2016-07-24 MED ORDER — METHYLPREDNISOLONE SODIUM SUCC 40 MG IJ SOLR
40.0000 mg | Freq: Two times a day (BID) | INTRAMUSCULAR | Status: DC
  Administered 2016-07-24: 40 mg via INTRAVENOUS
  Filled 2016-07-24: qty 1

## 2016-07-24 MED ORDER — INSULIN ASPART 100 UNIT/ML ~~LOC~~ SOLN
15.0000 [IU] | Freq: Three times a day (TID) | SUBCUTANEOUS | Status: DC
  Administered 2016-07-24 – 2016-07-25 (×5): 15 [IU] via SUBCUTANEOUS

## 2016-07-24 MED ORDER — POLYETHYLENE GLYCOL 3350 17 G PO PACK
17.0000 g | PACK | Freq: Two times a day (BID) | ORAL | Status: DC
  Administered 2016-07-25: 17 g via ORAL
  Filled 2016-07-24 (×2): qty 1

## 2016-07-24 MED ORDER — INSULIN DETEMIR 100 UNIT/ML ~~LOC~~ SOLN
40.0000 [IU] | Freq: Two times a day (BID) | SUBCUTANEOUS | Status: DC
  Administered 2016-07-24 (×2): 40 [IU] via SUBCUTANEOUS
  Filled 2016-07-24 (×3): qty 0.4

## 2016-07-24 MED ORDER — BISACODYL 10 MG RE SUPP
10.0000 mg | Freq: Every day | RECTAL | Status: DC
  Administered 2016-07-24: 10 mg via RECTAL
  Filled 2016-07-24 (×2): qty 1

## 2016-07-24 NOTE — Progress Notes (Signed)
Radiation Oncology         [phone] ________________________________  Initial inpatient Consultation  Name: [name] MRN: [id number]  Date: 07/24/16 DOB: 06-Mar-1946  QM:GQQP [place] Crane, MD  No ref. provider found   REFERRING PHYSICIAN: No ref. provider found  DIAGNOSIS: The primary encounter diagnosis was Dyspnea, unspecified type. Diagnoses of Malignant neoplasm of right lung, unspecified part of lung ([place]), Weak, and Stroke (cerebrum) ([place]) were also pertinent to this visit.    ICD-9-CM ICD-10-CM   1. Dyspnea, unspecified type 786.09 R06.00   2. Malignant neoplasm of right lung, unspecified part of lung (HCC) 162.9 C34.91   3. Weak 780.79 R53.1 CT ANGIO NECK W OR WO CONTRAST  4. Stroke (cerebrum) ([place]) 434.91 I63.9 [name] WO CONTRAST     [name] WO CONTRAST  HISTORY OF PRESENT ILLNESS: [name] is a 70 y.o. female seen at the request of Dr. Julien Nordmann for a diagnosis of recurrent lung cancer. The patient has a history of Stage IIIA (T2a N2 MX) non-small cell lung cancer, adenocarcinoma, diagnosed in October 2009 Status post right upper lobectomy with lymph node dissection under the care of Dr. Arlyce Dice on October 12, 2008. She then had  4 cycles of adjuvant chemotherapy with cisplatin and docetaxel given every 3 weeks which she completed on January 23, 2009. She also had adjuvant radiotherapy to the mediastinum. The patient received a total dose of 50.4 Gy between March 06, 2009, through April 07, 2009, under the care of Dr. Pablo Ledger.  She was lost to follow up but prior to the last two years had been NED. She was treated with two courses of antibiotics for pneumonia within the last month but progressively became short of breath despite increasing her oxygen from 2 to 5 liters at home. She presented to Dr. Julien Nordmann after a CXR did not reveal any tumor. On 07/18/16 a CT chest with contrast revealed progressive metastatic disease in the right lung with small right loculated effusion and  enlarging mediastinal and upper abdominal nodes. Her scan in the ED on 10/17 was to evaluate for stroke. She has disease of the vessels but no evidence of acute infarct. MRI of the [name] was negative.        PREVIOUS RADIATION THERAPY: Yes,  total dose of 50.4 Gy between March 06, 2009, through April 07, 2009, under the care of Dr. Pablo Ledger.   PAST MEDICAL HISTORY:  Past Medical History:  Diagnosis Date  . Adenocarcinoma of right lung, stage 3 ([place]) 08/28/2009      . Asthma   . Cholelithiasis   . Chronic bronchitis ([place])   . Colon polyps   . COPD (chronic obstructive pulmonary disease) (HCC)    USES O2 AT NIGHT + PRN IN DAYTIME3L  . Coronary artery disease    a. 2005 Cath/PCI: mRCA-> Cypher DES;  b. 06/2014 Cath/PCI: EF 60%. LM nl, LAD min irregs, D1 40, LCX 20p, OM2 95 (2.5x14 Resolute DES), RCA 30p, 30 ISR, 20d, PDA/PLA nl..  11/2015  Stenting of the entire RCA and LV branch off the RCA.    . Emphysema   . FH: chemotherapy 2010    had 4 times  . Fibromyalgia   . GERD (gastroesophageal reflux disease)   . H/O hiatal hernia   . Hyperlipidemia   . Hypertension   . Lung cancer ([place]) 2010   Adenocarcinoma, right lung, node positive  . On home oxygen therapy    "2-4L at night and prn" (07/18/2104)  .  Oxygen dependent    at night  3liters  . Peripheral vascular disease ([place])   . Pneumonia    hx of   . Radiation 6/10   28 times right lung, and 5 treatments to sternum  . Stress incontinence   . Tobacco abuse   . Type II diabetes mellitus ([place])       PAST SURGICAL HISTORY: Past Surgical History:  Procedure Laterality Date  . ABDOMINAL HYSTERECTOMY  1972  . ABDOMINAL HYSTERECTOMY    . APPENDECTOMY  1970  . arm fracture Left 3/14  . BACK SURGERY  2007  . CARDIAC CATHETERIZATION N/A 11/09/2015   Procedure: Left Heart Cath and Coronary Angiography;  Surgeon: Belva Crome, MD;  Location: [facility];  Service: Cardiovascular;  Laterality: N/A;  . CARDIAC CATHETERIZATION  Right 11/09/2015   Procedure: Intravascular Pressure Wire/FFR Study;  Surgeon: Belva Crome, MD;  Location: [facility] [facility];  Service: Cardiovascular;  Laterality: Right;  . CARDIAC CATHETERIZATION N/A 11/09/2015   Procedure: Coronary Stent Intervention;  Surgeon: Belva Crome, MD;  Location: [facility];  Service: Cardiovascular;  Laterality: N/A;  RCA  . CARPAL TUNNEL RELEASE    . CORONARY ANGIOPLASTY  2005  . EYE SURGERY     implants 02/18/12 (Left) 02/25/12 (right eye)  . FEMUR FRACTURE SURGERY Left    10/15  . FEMUR IM NAIL Left 07/19/2014   Procedure: INTRAMEDULLARY (IM) NAIL FEMORAL;  Surgeon: Alta Corning, MD;  Location: [facility];  Service: Orthopedics;  Laterality: Left;  . GALLBLADDER SURGERY  1989  . HARDWARE REMOVAL Left 07/19/2015   Procedure: HARDWARE REMOVAL;  Surgeon: Mcarthur Rossetti, MD;  Location: [facility];  Service: Orthopedics;  Laterality: Left;  . JOINT REPLACEMENT    . LEFT HEART CATHETERIZATION WITH CORONARY ANGIOGRAM N/A 06/20/2014   Procedure: LEFT HEART CATHETERIZATION WITH CORONARY ANGIOGRAM;  Surgeon: Troy Sine, MD;  Location: [facility];  Service: Cardiovascular;  Laterality: N/A;  . NECK SURGERY  2005  . ORIF HUMERUS FRACTURE Left 12/14/2012   Procedure: OPEN REDUCTION INTERNAL FIXATION (ORIF) LEFT PROXIMAL HUMERUS FRACTURE;  Surgeon: Mcarthur Rossetti, MD;  Location: [facility];  Service: Orthopedics;  Laterality: Left;  . PNEUMONECTOMY     Right Partial, lung cancer confirmed, nod positive  . PORT-A-CATH REMOVAL  05/12/2012   Procedure: REMOVAL PORT-A-CATH;  Surgeon: Melrose Nakayama, MD;  Location: [facility];  Service: Thoracic;  Laterality: Left;  . PORTACATH PLACEMENT  12/12/2007  . REFRACTIVE SURGERY Bilateral 2/14  . REPLACEMENT TOTAL KNEE  2001   left  . Right Upper Lobectomy with lymph node dissection  10/12/2008   Dr Arlyce Dice  . ROTATOR CUFF REPAIR     RIGHT SHOULDER  2 YRS  . TONSILLECTOMY  1966  . TOTAL KNEE REVISION Left 07/19/2015     Procedure: Removal Intramedullary Rod and Screws Left Knee/Femur, Revision arthroplasty Left knee;  Surgeon: Mcarthur Rossetti, MD;  Location: [facility];  Service: Orthopedics;  Laterality: Left;    FAMILY HISTORY:  Family History  Problem Relation Age of Onset  . Diabetes Mother   . Heart failure Mother   . Diabetes Father   . CAD Father 49  . Arthritis    . Lung disease    . Cancer    . Asthma      SOCIAL HISTORY:  Social History   Social History  . Marital status: Widowed    Spouse name: N/A  . Number  of children: N/A  . Years of education: N/A   Occupational History  . disabled    Social History Main Topics  . Smoking status: Current Every Day Smoker    Packs/day: 1.00    Years: 53.00    Types: Cigarettes  . Smokeless tobacco: Never Used  . Alcohol use No  . Drug use: No  . Sexual activity: No   Other Topics Concern  . Not on file   Social History Narrative   Daughter lives with her.  Also her mother and grandson also live with her.       ALLERGIES: Celebrex [celecoxib]; Niacin; Percocet [oxycodone-acetaminophen]; and Varenicline tartrate  MEDICATIONS:  Current Facility-Administered Medications  Medication Dose Route Frequency Provider Last Rate Last Dose  . acetaminophen (TYLENOL) tablet 650 mg  650 mg Oral Q6H PRN Rexene Alberts, MD       Or  . acetaminophen (TYLENOL) suppository 650 mg  650 mg Rectal Q6H PRN Rexene Alberts, MD      . aspirin EC tablet 81 mg  81 mg Oral Daily Rexene Alberts, MD   81 mg at 07/24/16 1113  . bisacodyl (DULCOLAX) suppository 10 mg  10 mg Rectal Daily PRN Janece Canterbury, MD      . bisacodyl (DULCOLAX) suppository 10 mg  10 mg Rectal Daily Janece Canterbury, MD   10 mg at 07/24/16 1114  . clopidogrel (PLAVIX) tablet 75 mg  75 mg Oral Q breakfast Rexene Alberts, MD   75 mg at 07/24/16 0836  . enoxaparin (LOVENOX) injection 40 mg  40 mg Subcutaneous QHS Rexene Alberts, MD   40 mg at 07/24/16 2214  . fluticasone  furoate-vilanterol (BREO ELLIPTA) 100-25 MCG/INH 1 puff  1 puff Inhalation Daily Rexene Alberts, MD   1 puff at 07/24/16 0851  . insulin aspart (novoLOG) injection 0-15 Units  0-15 Units Subcutaneous TID WC Rexene Alberts, MD   5 Units at 07/24/16 1805  . insulin aspart (novoLOG) injection 0-5 Units  0-5 Units Subcutaneous QHS Rexene Alberts, MD   3 Units at 07/23/16 2202  . insulin aspart (novoLOG) injection 15 Units  15 Units Subcutaneous TID WC Janece Canterbury, MD   15 Units at 07/24/16 1804  . insulin detemir (LEVEMIR) injection 40 Units  40 Units Subcutaneous BID Janece Canterbury, MD   40 Units at 07/24/16 2214  . levalbuterol (XOPENEX) nebulizer solution 0.63 mg  0.63 mg Nebulization TID Albertine Patricia, MD   0.63 mg at 07/24/16 2055  . methylPREDNISolone sodium succinate (SOLU-MEDROL) 40 mg/mL injection 40 mg  40 mg Intravenous BID Janece Canterbury, MD   40 mg at 07/24/16 2214  . metoprolol tartrate (LOPRESSOR) tablet 12.5 mg  12.5 mg Oral BID Janece Canterbury, MD   12.5 mg at 07/24/16 2213  . nicotine (NICODERM CQ - dosed in mg/24 hours) patch 21 mg  21 mg Transdermal Daily Rhetta Mura Schorr, NP   21 mg at 07/24/16 1115  . nitroGLYCERIN (NITROSTAT) SL tablet 0.4 mg  0.4 mg Sublingual Q5 Min x 3 PRN Rexene Alberts, MD      . nystatin (MYCOSTATIN/NYSTOP) topical powder   Topical TID Janece Canterbury, MD      . ondansetron [facility]) tablet 4 mg  4 mg Oral Q6H PRN Rexene Alberts, MD       Or  . ondansetron [facility]) injection 4 mg  4 mg Intravenous Q6H PRN Rexene Alberts, MD   4 mg at 07/22/16 2028  . oxyCODONE (Oxy IR/ROXICODONE) immediate release tablet 10 mg  10 mg Oral Q4H PRN Janece Canterbury, MD   10 mg at 07/24/16 2213  . pantoprazole (PROTONIX) EC tablet 80 mg  80 mg Oral Q1200 Rexene Alberts, MD   80 mg at 07/24/16 1114  . piperacillin-tazobactam (ZOSYN) IVPB 3.375 g  3.375 g Intravenous Q8H Rexene Alberts, MD   3.375 g at 07/24/16 2212  . polyethylene glycol (MIRALAX / GLYCOLAX) packet 17 g  17 g  Oral BID Janece Canterbury, MD      . polyvinyl alcohol (LIQUIFILM TEARS) 1.4 % ophthalmic solution 1 drop  1 drop Both Eyes BID Rexene Alberts, MD   1 drop at 07/24/16 2212  . pravastatin (PRAVACHOL) tablet 40 mg  40 mg Oral q1800 Rexene Alberts, MD   40 mg at 07/24/16 1800  . pregabalin (LYRICA) capsule 50 mg  50 mg Oral BID Rexene Alberts, MD   50 mg at 07/24/16 2213  . senna (SENOKOT) tablet 17.2 mg  2 tablet Oral QHS Janece Canterbury, MD   17.2 mg at 07/23/16 2145  . umeclidinium bromide (INCRUSE ELLIPTA) 62.5 MCG/INH 1 puff  1 puff Inhalation Daily Rexene Alberts, MD   1 puff at 07/24/16 [phone]    REVIEW OF SYSTEMS:  On review of systems, the patient reports that she is feeling better now in the hospital as far as her breathing but she does get short of breath with only taking a few steps. She has been taking miralax for constipation due to narcotics. She takes these for right sided chest pain. She denies any cough, fevers, chills, night sweats, but has lost about 15 pounds in the last 6 months. She denies any bowel or bladder disturbances, and denies abdominal pain, nausea or vomiting. She denies any new musculoskeletal or joint aches or pains. A complete review of systems is obtained and is otherwise negative.    PHYSICAL EXAM:  height is '5\' 2"'$  (1.575 m) and weight is 205 lb (93 kg). Her oral temperature is 98 F (36.7 C). Her blood pressure is 116/57 (abnormal) and her pulse is 91. Her respiration is 20 and oxygen saturation is 96%.   Pain Scale 0/10 In general this is a chronically ill appearing caucasian female in no acute distress. She is alert and oriented x4 and appropriate throughout the examination. HEENT reveals that the patient is normocephalic, atraumatic. EOMs are intact. PERRLA. Skin is intact without any evidence of gross lesions. Cardiovascular exam reveals a regular rate and rhythm, no clicks rubs or murmurs are auscultated. Chest is clear to auscultation bilaterally. Lymphatic  assessment is performed and does not reveal any adenopathy in the cervical, supraclavicular, axillary, or inguinal chains. Abdomen has active bowel sounds in all quadrants and is intact. The abdomen is soft, non tender, non distended. Lower extremities are negative for pretibial pitting edema, deep calf tenderness, cyanosis or clubbing.   KPS = 50  100 - Normal; no complaints; no evidence of disease. 90   - Able to carry on normal activity; minor signs or symptoms of disease. 80   - Normal activity with effort; some signs or symptoms of disease. 66   - Cares for self; unable to carry on normal activity or to do active work. 60   - Requires occasional assistance, but is able to care for most of his personal needs. 50   - Requires considerable assistance and frequent medical care. 63   - Disabled; requires special care and assistance. 80   - Severely disabled; hospital admission is indicated although death  not imminent. 38   - Very sick; hospital admission necessary; active supportive treatment necessary. 10   - Moribund; fatal processes progressing rapidly. 0     - Dead  Karnofsky DA, Abelmann [place], Craver LS and Burchenal [facility] [phone]) The use of the nitrogen mustards in the palliative treatment of carcinoma: with particular reference to bronchogenic carcinoma Cancer 1 634-56  LABORATORY DATA:  Lab Results  Component Value Date   WBC 9.5 07/24/2016   HGB 10.9 (L) 07/24/2016   HCT 36.4 07/24/2016   MCV 82.2 07/24/2016   PLT 214 07/24/2016   Lab Results  Component Value Date   NA 138 07/24/2016   K 5.6 (H) 07/24/2016   CL 105 07/24/2016   CO2 27 07/24/2016   Lab Results  Component Value Date   ALT 16 07/23/2016   AST 20 07/23/2016   ALKPHOS 61 07/23/2016   BILITOT 1.2 07/23/2016     RADIOGRAPHY: Ct Angio Head W Or Wo Contrast  Result Date: 07/22/2016 CLINICAL DATA:  Right-sided facial droop EXAM: CT ANGIOGRAPHY HEAD AND NECK TECHNIQUE: Multidetector CT imaging of the head and  neck was performed using the standard protocol during bolus administration of intravenous contrast. Multiplanar CT image reconstructions and MIPs were obtained to evaluate the vascular anatomy. Carotid stenosis measurements (when applicable) are obtained utilizing NASCET criteria, using the distal internal carotid diameter as the denominator. CONTRAST:  100 mL Isovue 370 IV COMPARISON:  Head CT 07/22/2016 FINDINGS: CTA NECK FINDINGS Aortic arch: There is a normal variant aortic arch branching pattern with the brachiocephalic and left common carotid arteries sharing a common origin. The visualized bilateral proximal subclavian arteries are normal. There is atherosclerotic calcification of the aortic arch. Right carotid system: There is atherosclerotic plaque at the right carotid bifurcation resulting in 60 percent stenosis. The remainder of the cervical right ICA is normal. Left carotid system: There is atherosclerotic plaque and calcification of the proximal left ICA resulting in 50 percent stenosis. Vertebral arteries: The right vertebral artery origin and V1 segment are not visualized. The diminutive right vertebral artery is 1st identified at the C3 level. The remainder of its V2 and V3 segments are normal. There is atherosclerotic calcification the narrows the lumen as it enters the foramen magnum, but a remains patent to the confluence with the basilar artery. The left vertebral artery is dominant and normal along its entire course. There is atherosclerotic calcification that narrows its origin. Skeleton: Anterior cervical fusion hardware at C5-6. Other neck: Unremarkable Upper chest: There is complete opacification of the right upper lobe associated collapse and large pleural effusion. This is better characterized on recent chest CT. Review of the MIP images confirms the above findings CTA HEAD FINDINGS Anterior circulation: --Intracranial internal carotid arteries: There is atherosclerotic calcification of  the proximal intracranial internal carotid artery is without greater than 50 percent stenosis. --Anterior cerebral arteries: Normal. --Middle cerebral arteries: Normal. --Posterior communicating arteries: Present bilaterally, larger on the right. Posterior circulation: --Posterior cerebral arteries: Fetal origin of the right PCA. Normal left PCA. --Superior cerebellar arteries: Normal. --Basilar artery: Normal. --Anterior inferior cerebellar arteries: Normal. --Posterior inferior cerebellar arteries: Normal. Venous sinuses: As permitted by contrast timing, patent. Anatomic variants: None Delayed phase: Not performed Review of the MIP images confirms the above findings IMPRESSION: 1. No occlusion or high-grade stenosis of the intracranial arteries. 2. Bilateral proximal internal carotid artery stenosis, measuring 60 percent on the right and 50 percent on the left. 3. Diminutive right vertebral artery with lack of  opacification below the C3 level. This may be congenital or secondary to chronic occlusion/stenosis. Electronically Signed   By: Ulyses Jarred M.D.   On: 07/22/2016 16:29   Dg Chest 2 View  Result Date: 07/22/2016 CLINICAL DATA:  Increased shortness of breath for 1 week. EXAM: CHEST  2 VIEW COMPARISON:  06/27/2016 and CT from 07/18/2016 FINDINGS: The heart size appears normal. There is near complete opacification of the entire right hemi thorax with a small focus of aerated lung in the right upper lung zone. There is subsegmental atelectasis within the left base. When compared with the previous chest radiograph there has been significantly diminished aeration to the entire right hemi thorax. Today's imaging findings correlate with CT appearance of the right lung from 07/18/2016. IMPRESSION: 1. Markedly diminished aeration to the right lung when compared with 06/27/2016. Findings most likely reflect progression of lung cancer. Electronically Signed   By: Kerby Moors M.D.   On: 07/22/2016 12:05   Dg  Chest 2 View  Result Date: 06/27/2016 CLINICAL DATA:  Shortness of Breath EXAM: CHEST  2 VIEW COMPARISON:  06/19/2016 FINDINGS: Cardiomegaly again noted. Stable postsurgical and postradiation changes right lung post right upper lobectomy. No infiltrate or pulmonary edema. Osteopenia and mild degenerative changes thoracic spine. IMPRESSION: No active disease. Stable postradiation changes and postoperative changes right hemi thorax. Electronically Signed   By: Lahoma Crocker M.D.   On: 06/27/2016 16:48   Ct Angio Neck W Or Wo Contrast  Result Date: 07/22/2016 CLINICAL DATA:  Right-sided facial droop EXAM: CT ANGIOGRAPHY HEAD AND NECK TECHNIQUE: Multidetector CT imaging of the head and neck was performed using the standard protocol during bolus administration of intravenous contrast. Multiplanar CT image reconstructions and MIPs were obtained to evaluate the vascular anatomy. Carotid stenosis measurements (when applicable) are obtained utilizing NASCET criteria, using the distal internal carotid diameter as the denominator. CONTRAST:  100 mL Isovue 370 IV COMPARISON:  Head CT 07/22/2016 FINDINGS: CTA NECK FINDINGS Aortic arch: There is a normal variant aortic arch branching pattern with the brachiocephalic and left common carotid arteries sharing a common origin. The visualized bilateral proximal subclavian arteries are normal. There is atherosclerotic calcification of the aortic arch. Right carotid system: There is atherosclerotic plaque at the right carotid bifurcation resulting in 60 percent stenosis. The remainder of the cervical right ICA is normal. Left carotid system: There is atherosclerotic plaque and calcification of the proximal left ICA resulting in 50 percent stenosis. Vertebral arteries: The right vertebral artery origin and V1 segment are not visualized. The diminutive right vertebral artery is 1st identified at the C3 level. The remainder of its V2 and V3 segments are normal. There is atherosclerotic  calcification the narrows the lumen as it enters the foramen magnum, but a remains patent to the confluence with the basilar artery. The left vertebral artery is dominant and normal along its entire course. There is atherosclerotic calcification that narrows its origin. Skeleton: Anterior cervical fusion hardware at C5-6. Other neck: Unremarkable Upper chest: There is complete opacification of the right upper lobe associated collapse and large pleural effusion. This is better characterized on recent chest CT. Review of the MIP images confirms the above findings CTA HEAD FINDINGS Anterior circulation: --Intracranial internal carotid arteries: There is atherosclerotic calcification of the proximal intracranial internal carotid artery is without greater than 50 percent stenosis. --Anterior cerebral arteries: Normal. --Middle cerebral arteries: Normal. --Posterior communicating arteries: Present bilaterally, larger on the right. Posterior circulation: --Posterior cerebral arteries: Fetal origin of the  right PCA. Normal left PCA. --Superior cerebellar arteries: Normal. --Basilar artery: Normal. --Anterior inferior cerebellar arteries: Normal. --Posterior inferior cerebellar arteries: Normal. Venous sinuses: As permitted by contrast timing, patent. Anatomic variants: None Delayed phase: Not performed Review of the MIP images confirms the above findings IMPRESSION: 1. No occlusion or high-grade stenosis of the intracranial arteries. 2. Bilateral proximal internal carotid artery stenosis, measuring 60 percent on the right and 50 percent on the left. 3. Diminutive right vertebral artery with lack of opacification below the C3 level. This may be congenital or secondary to chronic occlusion/stenosis. Electronically Signed   By: Ulyses Jarred M.D.   On: 07/22/2016 16:29   Ct Chest W Contrast  Result Date: 07/19/2016 CLINICAL DATA:  Right lung cancer. EXAM: CT CHEST WITH CONTRAST TECHNIQUE: Multidetector CT imaging of the  chest was performed during intravenous contrast administration. CONTRAST:  81m ISOVUE-300 IOPAMIDOL (ISOVUE-300) INJECTION 61% COMPARISON:  11/08/2015. FINDINGS: Cardiovascular: Atherosclerotic calcification of the arterial vasculature, including three-vessel involvement of the coronary arteries. Heart size normal. No pericardial effusion. Mediastinum/Nodes: Mediastinal lymph nodes measure up to 1.7 cm in the low right paratracheal station and AP window, previously 5 mm. Subcarinal lymph node measures 1.7 cm, previously 6 mm. No left hilar adenopathy. Right hilum is difficult to assess due to obscuration by extensive consolidation. No axillary adenopathy. Mid/distal esophagus appears thickened, similar. Lungs/Pleura: Postoperative changes of right upper lobectomy with collapse/consolidation involving nearly the entire right lung. There is an area of low attenuation contained within the consolidation, measuring 2.2 cm (image 35). Obscuration of previously seen radiation therapy changes in the medial right hemi thorax. Small partially loculated right pleural effusion, new. Upper Abdomen: Visualized portions of the liver, adrenal glands, right kidney unremarkable. Tiny left renal stone. Visualized portions of the spleen, pancreas, stomach unremarkable. There are enlarged gastrohepatic ligament and retroperitoneal lymph nodes. Index gastrohepatic ligament lymph node measures 1.3 cm and index aortocaval lymph node measures 1.3 cm. Juxta diaphragmatic lymph nodes measure up to 8 mm, new. Musculoskeletal: No worrisome lytic or sclerotic lesions. Postoperative changes in the left humerus and lower cervical spine. Degenerative changes are seen in the spine. IMPRESSION: 1. Progression of metastatic lung cancer as evidenced by extensive collapse/consolidation throughout the right lung, a small loculated right pleural effusion and new/enlarging mediastinal/upper abdominal lymph nodes. 2. Aortic atherosclerosis (ICD10-170.0).  Coronary artery calcification. 3. Mid/distal esophageal wall thickening, similar. 4. Tiny left renal stone. Electronically Signed   By: MLorin PicketM.D.   On: 07/19/2016 08:36   [name] Wo Contrast  Result Date: 07/22/2016 CLINICAL DATA:  Shortness of breath and right-sided facial droop. EXAM: MRI HEAD WITHOUT CONTRAST TECHNIQUE: Multiplanar, multiecho pulse sequences of the [name] and surrounding structures were obtained without intravenous contrast. COMPARISON:  Head CT 07/22/2016 and [name] MRI 11/15/2008 FINDINGS: The examination had to be discontinued prior to completion due to the patient's inability to tolerate further imaging. [name]: No acute infarct or intraparenchymal hemorrhage. The midline structures are normal. No focal parenchymal signal abnormality. No mass lesion or midline shift. No hydrocephalus or extra-axial fluid collection. Vascular: Major intracranial arterial and venous sinus flow voids are preserved. No evidence of chronic microhemorrhage or amyloid angiopathy. Skull and upper cervical spine: The visualized skull base, calvarium, upper cervical spine and extracranial soft tissues are normal. Sinuses/Orbits: No fluid levels or advanced mucosal thickening. No mastoid effusion. Normal orbits. IMPRESSION: Normal [name] MRI for age. Electronically Signed   By: KUlyses JarredM.D.   On: 07/22/2016 17:23   Ct  Head Code Stroke Wo Contrast  Addendum Date: 07/22/2016   ADDENDUM REPORT: 07/22/2016 15:59 ADDENDUM: Study discussed by telephone with Dr. Jeneen Rinks On 07/22/2016 at 1555 hours. Electronically Signed   By: Genevie Ann M.D.   On: 07/22/2016 15:59   Result Date: 07/22/2016 CLINICAL DATA:  Code stroke. 70 year old female with sudden change in mental status, left facial droop and left extremity weakness. Initial encounter. EXAM: CT HEAD WITHOUT CONTRAST TECHNIQUE: Contiguous axial images were obtained from the base of the skull through the vertex without intravenous contrast. COMPARISON:   Head CTs without contrast 11/27/2012 and earlier. FINDINGS: [name]: Cerebral volume is within normal limits for age. Stable and normal Stable gray-white matter differentiation throughout the [name]. No midline shift, ventriculomegaly, mass effect, evidence of mass lesion, intracranial hemorrhage or evidence of cortically based acute infarction. Vascular: Extensive Calcified atherosclerosis at the skull base. No suspicious intracranial vascular hyperdensity. Skull: No acute osseous abnormality identified. Hyperostosis frontalis. Sinuses/Orbits: Visualized paranasal sinuses and mastoids are stable and well pneumatized. Other: No acute orbit or scalp soft tissue finding. ASPECTS ([place] Stroke Program Early CT Score) - Ganglionic level infarction (caudate, lentiform nuclei, internal capsule, insula, M1-M3 cortex): 7 - Supraganglionic infarction (M4-M6 cortex): 3 Total score (0-10 with 10 being normal): 10 IMPRESSION: 1. Stable and normal for age noncontrast CT appearance of the [name]. 2. ASPECTS is 10. Electronically Signed: By: Genevie Ann M.D. On: 07/22/2016 15:48      IMPRESSION/PLAN: 1. Recurrent stage IIIA, T2a, N2, M0 NSCLC, adenocarcinoma of the right upper lobe with disease within the right lung causing obstruction and lung collapse. We discussed the findings on imaging and have reviewed Dr. Johny Shears recommendations for proceeding with palliative radiotherapy to the right chest to improve her breathing. He would recommend 30 Gy in 10 fractions. She is interested in moving forward. We discussed logistics and delivery and the risks, benefits, short, and long term effects of treatment. She is interested in moving forward and will come to the department today for simulation scan and to begin treatment today.   Carola Rhine, PAC

## 2016-07-24 NOTE — Progress Notes (Signed)
  Echocardiogram 2D Echocardiogram has been performed.  Tresa Res 07/24/2016, 3:54 PM

## 2016-07-24 NOTE — Progress Notes (Signed)
  Radiation Oncology         [phone] ________________________________  Name: [name] MRN: [id number]  Date: 07/22/2016  DOB: 10/24/1945  INPATIENT  SIMULATION AND TREATMENT PLANNING NOTE    ICD-9-CM ICD-10-CM   1. Dyspnea, unspecified type 786.09 R06.00   2. Malignant neoplasm of right lung, unspecified part of lung (HCC) 162.9 C34.91   3. Weak 780.79 R53.1 CT ANGIO NECK W OR WO CONTRAST  4. Stroke (cerebrum) (HCC) 434.91 I63.9 MR BRAIN WO CONTRAST     MR BRAIN WO CONTRAST    DIAGNOSIS:  70 yo woman with recurrent NSCLC in the right lung  NARRATIVE:  The patient was brought to the [facility].  Identity was confirmed.  All relevant records and images related to the planned course of therapy were reviewed.  The patient freely provided informed written consent to proceed with treatment after reviewing the details related to the planned course of therapy. The consent form was witnessed and verified by the simulation staff.  Then, the patient was set-up in a stable reproducible  supine position for radiation therapy.  CT images were obtained.  Surface markings were placed.  The CT images were loaded into the planning software.  Then the target and avoidance structures were contoured.  Treatment planning then occurred.  The radiation prescription was entered and confirmed.  Then, I designed and supervised the construction of a total of 6 medically necessary complex treatment devices, including a BodyFix immobilization mold custom fitted to the patient along with 5 multileaf collimators conformally shaped radiation around the treatment target while shielding critical structures such as the heart and spinal cord maximally.  I have requested : 3D Simulation  I have requested a DVH of the following structures: Left lung, right lung, spinal cord, heart, esophagus, and target.  I have ordered:Nutrition Consult  SPECIAL TREATMENT PROCEDURE:  The planned course of therapy using  radiation constitutes a special treatment procedure. Special care is required in the management of this patient for the following reasons.  The patient will be receiving re-irradiation of a previously treated area in the right lung.  The special nature of the planned course of radiotherapy will require increased physician supervision and oversight to ensure patient's safety with optimal treatment outcomes.  PLAN:  The patient will receive 30 Gy in 10 fractions.  ________________________________  Sheral Apley Tammi Klippel, M.D.

## 2016-07-24 NOTE — Telephone Encounter (Signed)
Called Katharine Look RN on [place] to see if [name] would be able to come for a mark and start for radiation at 1 pm.  Katharine Look said [name] should be fine to come at 1 and travels by wheelchair.  Notified Candace in CT SIM.

## 2016-07-24 NOTE — Consult Note (Signed)
   [facility] CM Inpatient Consult   07/24/2016  Kammie Scioli [facility] November 16, 1945 [id number]     Patient screened for [place] Management services. Chart reviewed. Noted Palliative Medicine consult pending. Will not attempt to engage for [facility] [place] Management services at this time.   Marthenia Rolling, MSN-Ed, RN,BSN [facility] Liaison [phone]

## 2016-07-24 NOTE — Progress Notes (Signed)
PROGRESS NOTE  [name]  YSA:[id number] DOB: 1946/10/04 DOA: 07/22/2016 PCP: [name], MD  Brief Narrative:   [name] is a 70 y.o. 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  CT scan of her chest ordered by her oncologist on 07/18/16 revealed progression of metastatic lung cancer as evidenced by extensive collapse/consolidation throughout the right lung and a small loculated right pleural effusion and new/enlarging mediastinal/upper abdominal lymph nodes.  She was admitted to the stepdown unit for respiratory distress and started on steroids and antibiotics for possible loculated effusion.  Transferred to floor on 10/19.     Assessment & Plan:   Principal Problem:   Collapse of right lung Active Problems:   Type II diabetes mellitus (HCC)   Tobacco abuse   Morbid obesity (HCC)   Acute on chronic respiratory failure with hypoxia (HCC)   Primary cancer of right lung (HCC)   Loculated pleural effusion   Malignant neoplasm of right lung (HCC)   Transient cerebral ischemia  Acute on chronic respiratory failure with hypoxia secondary to progression of metastatic lung cancer, possible COPD exacerbation or post-obstructive pneumonia.  Effusion is likely malignant and is small.  Patient has been afebrile and without leukocytosis.  -  Appreciate oncology assistance -  Reduce Solu-Medrol to BID -  Continue bronchodilators -  Continue Breo -  Continue zosyn and plan to discharge on augmentin to complete a 7-10 day course  TIA, symptoms have completely resolved -  MRI brain negative for stroke, no evidence of metastases -  CT  angiogram of the head and neck demonstrated moderate ICA stenosis, but less than 70% -  Echocardiogram for completeness -  Patient is already on aspirin and Plavix for her CAD -  Lipid panel LDL 93 and A1c pending -  Given progression of her metastatic disease, unclear benefit of statin medication at this time -  Continue neuro checks  Primary cancer of the right lung, previously in remission for the last 8 years -  Appreciate Dr. Worthy Flank assistance -  DO NOT RESUSCITATE -  Patient trying to decide if she would like to pursue palliative radiation and chemotherapy -  Pain control:  PRN Oxycodone 10 mg   Narcotic induced constipation -  Increase MiraLAX, senna -  Administer when necessary bisacodyl  Insulin-dependent diabetes mellitus 2, hyperglycemia secondary to reduced insulin in the setting of steroids -  Increase Levemir to 40 units twice daily -  Continue moderate dose sliding scale insulin -  Increase standing aspart with meals -  Frequent CBGs to adjust insulin rapidly -  Decrease steroids  CAD, chest pain atypical and related to malignancy - continue aspirin, plavix, statin  Ongoing tobacco abuse, encouraged cessation  Hyperkalemia, likely spurious. Patient does not have acute kidney injury and has not been administered medications are potassium supplementation that would explain her elevated potassium -  Repeat BMP in a.m.   DVT prophylaxis:  lovenox Code Status:  DO NOT RESUSCITATE Family Communication:  Patient's questions were answered, no family at bedside Disposition Plan:  Home pending further improvement in respiratory distress, tolerating oral medications. Anticipate that she may be a candidate for hospice pending conversations with Dr.  Mohamed.  PT evaluation   Consultants:   Dr. Julien Nordmann, oncology  Procedures:  None  Antimicrobials:   Vancomycin 10/17  Zosyn 10/17    Subjective: Shortness of breath has improved somewhat. She is still not had a  good bowel movement. Her abdomen feels distended and bloated. Pain is better controlled today.   Objective: Vitals:   07/23/16 2018 07/23/16 2143 07/24/16 0544 07/24/16 0852  BP:  (!) 146/54 127/61   Pulse:  (!) 104 78   Resp:  18 18   Temp:  98.1 F (36.7 C) 97.5 F (36.4 C)   TempSrc:  Oral Oral   SpO2: 93% 98% 96% 97%  Weight:      Height:        Intake/Output Summary (Last 24 hours) at 07/24/16 1234 Last data filed at 07/24/16 0951  Gross per 24 hour  Intake             1050 ml  Output             2550 ml  Net            -1500 ml   Filed Weights   07/22/16 1036  Weight: 93 kg (205 lb)    Examination:  General exam:  Adult female.  No acute distress  HEENT:  NCAT, MMM Respiratory system:  Diminished sounds throughout the right lung fields, no focal rales. No wheezes or rhonchi  Cardiovascular system: Regular rate and rhythm, normal S1/S2. No murmurs, rubs, gallops or clicks.  Warm extremities Gastrointestinal system: Normal active bowel sounds, soft, nondistended, nontender. MSK:  Normal tone and bulk, no lower extremity edema Neuro:  Grossly intact   Data Reviewed: I have personally reviewed following labs and imaging studies  CBC:  Recent Labs Lab 07/22/16 1108 07/23/16 0315 07/24/16 0501  WBC 5.0 5.7 9.5  NEUTROABS 3.2  --   --   HGB 11.0* 11.4* 10.9*  HCT 37.0 38.1 36.4  MCV 83.3 83.0 82.2  PLT 164 193 093   Basic Metabolic Panel:  Recent Labs Lab 07/22/16 1108 07/23/16 0315 07/24/16 0501  NA 139 138 138  K 3.8 4.7 5.6*  CL 103 105 105  CO2 '28 25 27  '$ GLUCOSE 145* 196* 235*  BUN 16 17 28*  CREATININE 0.77 0.75 0.82  CALCIUM 8.8* 9.1 9.2   GFR: Estimated Creatinine Clearance: 67.8 mL/min (by C-G formula based on SCr of 0.82 mg/dL). Liver Function Tests:  Recent Labs Lab 07/22/16 1108 07/23/16 0315  AST 18 20  ALT 13* 16  ALKPHOS 58 61  BILITOT 0.6 1.2  PROT 6.4* 6.8  ALBUMIN 3.4* 3.5   No results for input(s): LIPASE,  AMYLASE in the last 168 hours. No results for input(s): AMMONIA in the last 168 hours. Coagulation Profile: No results for input(s): INR, PROTIME in the last 168 hours. Cardiac Enzymes:  Recent Labs Lab 07/22/16 1108  TROPONINI <0.03   BNP (last 3 results) No results for input(s): PROBNP in the last 8760 hours. HbA1C: No results for input(s): HGBA1C in the last 72 hours. CBG:  Recent Labs Lab 07/23/16 1639 07/23/16 2145 07/24/16 0042 07/24/16 0809 07/24/16 1212  GLUCAP 304* 251* 259* 201* 273*   Lipid Profile:  Recent Labs  07/24/16 0501  CHOL 153  HDL 38*  LDLCALC 93  TRIG 112  CHOLHDL 4.0   Thyroid Function Tests: No results for input(s): TSH, T4TOTAL, FREET4, T3FREE, THYROIDAB in the last 72 hours. Anemia Panel: No results for input(s): VITAMINB12,  FOLATE, FERRITIN, TIBC, IRON, RETICCTPCT in the last 72 hours. Urine analysis:    Component Value Date/Time   COLORURINE YELLOW 06/17/2016 [place] 06/17/2016 1023   LABSPEC 1.020 06/17/2016 1023   PHURINE 5.5 06/17/2016 1023   GLUCOSEU >1000 (A) 06/17/2016 1023   HGBUR NEGATIVE 06/17/2016 1023   BILIRUBINUR NEGATIVE 06/17/2016 1023   KETONESUR NEGATIVE 06/17/2016 1023   PROTEINUR NEGATIVE 06/17/2016 1023   UROBILINOGEN 0.2 06/07/2015 1122   NITRITE POSITIVE (A) 06/17/2016 1023   LEUKOCYTESUR NEGATIVE 06/17/2016 1023   Sepsis Labs: '@LABRCNTIP'$ (procalcitonin:4,lacticidven:4)  ) Recent Results (from the past 240 hour(s))  MRSA PCR Screening     Status: None   Collection Time: 07/22/16  6:05 PM  Result Value Ref Range Status   MRSA by PCR NEGATIVE NEGATIVE Final    Comment:        The GeneXpert MRSA Assay (FDA approved for NASAL specimens only), is one component of a comprehensive MRSA colonization surveillance program. It is not intended to diagnose MRSA infection nor to guide or monitor treatment for MRSA infections.       Radiology Studies: Ct Angio Head W Or Wo  Contrast  Result Date: 07/22/2016 CLINICAL DATA:  Right-sided facial droop EXAM: CT ANGIOGRAPHY HEAD AND NECK TECHNIQUE: Multidetector CT imaging of the head and neck was performed using the standard protocol during bolus administration of intravenous contrast. Multiplanar CT image reconstructions and MIPs were obtained to evaluate the vascular anatomy. Carotid stenosis measurements (when applicable) are obtained utilizing NASCET criteria, using the distal internal carotid diameter as the denominator. CONTRAST:  100 mL Isovue 370 IV COMPARISON:  Head CT 07/22/2016 FINDINGS: CTA NECK FINDINGS Aortic arch: There is a normal variant aortic arch branching pattern with the brachiocephalic and left common carotid arteries sharing a common origin. The visualized bilateral proximal subclavian arteries are normal. There is atherosclerotic calcification of the aortic arch. Right carotid system: There is atherosclerotic plaque at the right carotid bifurcation resulting in 60 percent stenosis. The remainder of the cervical right ICA is normal. Left carotid system: There is atherosclerotic plaque and calcification of the proximal left ICA resulting in 50 percent stenosis. Vertebral arteries: The right vertebral artery origin and V1 segment are not visualized. The diminutive right vertebral artery is 1st identified at the C3 level. The remainder of its V2 and V3 segments are normal. There is atherosclerotic calcification the narrows the lumen as it enters the foramen magnum, but a remains patent to the confluence with the basilar artery. The left vertebral artery is dominant and normal along its entire course. There is atherosclerotic calcification that narrows its origin. Skeleton: Anterior cervical fusion hardware at C5-6. Other neck: Unremarkable Upper chest: There is complete opacification of the right upper lobe associated collapse and large pleural effusion. This is better characterized on recent chest CT. Review of the  MIP images confirms the above findings CTA HEAD FINDINGS Anterior circulation: --Intracranial internal carotid arteries: There is atherosclerotic calcification of the proximal intracranial internal carotid artery is without greater than 50 percent stenosis. --Anterior cerebral arteries: Normal. --Middle cerebral arteries: Normal. --Posterior communicating arteries: Present bilaterally, larger on the right. Posterior circulation: --Posterior cerebral arteries: Fetal origin of the right PCA. Normal left PCA. --Superior cerebellar arteries: Normal. --Basilar artery: Normal. --Anterior inferior cerebellar arteries: Normal. --Posterior inferior cerebellar arteries: Normal. Venous sinuses: As permitted by contrast timing, patent. Anatomic variants: None Delayed phase: Not performed Review of the MIP images confirms the above findings IMPRESSION: 1. No occlusion or  high-grade stenosis of the intracranial arteries. 2. Bilateral proximal internal carotid artery stenosis, measuring 60 percent on the right and 50 percent on the left. 3. Diminutive right vertebral artery with lack of opacification below the C3 level. This may be congenital or secondary to chronic occlusion/stenosis. Electronically Signed   By: Ulyses Jarred M.D.   On: 07/22/2016 16:29   Ct Angio Neck W Or Wo Contrast  Result Date: 07/22/2016 CLINICAL DATA:  Right-sided facial droop EXAM: CT ANGIOGRAPHY HEAD AND NECK TECHNIQUE: Multidetector CT imaging of the head and neck was performed using the standard protocol during bolus administration of intravenous contrast. Multiplanar CT image reconstructions and MIPs were obtained to evaluate the vascular anatomy. Carotid stenosis measurements (when applicable) are obtained utilizing NASCET criteria, using the distal internal carotid diameter as the denominator. CONTRAST:  100 mL Isovue 370 IV COMPARISON:  Head CT 07/22/2016 FINDINGS: CTA NECK FINDINGS Aortic arch: There is a normal variant aortic arch branching  pattern with the brachiocephalic and left common carotid arteries sharing a common origin. The visualized bilateral proximal subclavian arteries are normal. There is atherosclerotic calcification of the aortic arch. Right carotid system: There is atherosclerotic plaque at the right carotid bifurcation resulting in 60 percent stenosis. The remainder of the cervical right ICA is normal. Left carotid system: There is atherosclerotic plaque and calcification of the proximal left ICA resulting in 50 percent stenosis. Vertebral arteries: The right vertebral artery origin and V1 segment are not visualized. The diminutive right vertebral artery is 1st identified at the C3 level. The remainder of its V2 and V3 segments are normal. There is atherosclerotic calcification the narrows the lumen as it enters the foramen magnum, but a remains patent to the confluence with the basilar artery. The left vertebral artery is dominant and normal along its entire course. There is atherosclerotic calcification that narrows its origin. Skeleton: Anterior cervical fusion hardware at C5-6. Other neck: Unremarkable Upper chest: There is complete opacification of the right upper lobe associated collapse and large pleural effusion. This is better characterized on recent chest CT. Review of the MIP images confirms the above findings CTA HEAD FINDINGS Anterior circulation: --Intracranial internal carotid arteries: There is atherosclerotic calcification of the proximal intracranial internal carotid artery is without greater than 50 percent stenosis. --Anterior cerebral arteries: Normal. --Middle cerebral arteries: Normal. --Posterior communicating arteries: Present bilaterally, larger on the right. Posterior circulation: --Posterior cerebral arteries: Fetal origin of the right PCA. Normal left PCA. --Superior cerebellar arteries: Normal. --Basilar artery: Normal. --Anterior inferior cerebellar arteries: Normal. --Posterior inferior cerebellar  arteries: Normal. Venous sinuses: As permitted by contrast timing, patent. Anatomic variants: None Delayed phase: Not performed Review of the MIP images confirms the above findings IMPRESSION: 1. No occlusion or high-grade stenosis of the intracranial arteries. 2. Bilateral proximal internal carotid artery stenosis, measuring 60 percent on the right and 50 percent on the left. 3. Diminutive right vertebral artery with lack of opacification below the C3 level. This may be congenital or secondary to chronic occlusion/stenosis. Electronically Signed   By: Ulyses Jarred M.D.   On: 07/22/2016 16:29   Mr Brain Wo Contrast  Result Date: 07/22/2016 CLINICAL DATA:  Shortness of breath and right-sided facial droop. EXAM: MRI HEAD WITHOUT CONTRAST TECHNIQUE: Multiplanar, multiecho pulse sequences of the brain and surrounding structures were obtained without intravenous contrast. COMPARISON:  Head CT 07/22/2016 and brain MRI 11/15/2008 FINDINGS: The examination had to be discontinued prior to completion due to the patient's inability to tolerate further imaging. Brain:  No acute infarct or intraparenchymal hemorrhage. The midline structures are normal. No focal parenchymal signal abnormality. No mass lesion or midline shift. No hydrocephalus or extra-axial fluid collection. Vascular: Major intracranial arterial and venous sinus flow voids are preserved. No evidence of chronic microhemorrhage or amyloid angiopathy. Skull and upper cervical spine: The visualized skull base, calvarium, upper cervical spine and extracranial soft tissues are normal. Sinuses/Orbits: No fluid levels or advanced mucosal thickening. No mastoid effusion. Normal orbits. IMPRESSION: Normal brain MRI for age. Electronically Signed   By: Ulyses Jarred M.D.   On: 07/22/2016 17:23   Ct Head Code Stroke Wo Contrast  Addendum Date: 07/22/2016   ADDENDUM REPORT: 07/22/2016 15:59 ADDENDUM: Study discussed by telephone with Dr. Jeneen Rinks On 07/22/2016 at 1555  hours. Electronically Signed   By: Genevie Ann M.D.   On: 07/22/2016 15:59   Result Date: 07/22/2016 CLINICAL DATA:  Code stroke. 70 year old female with sudden change in mental status, left facial droop and left extremity weakness. Initial encounter. EXAM: CT HEAD WITHOUT CONTRAST TECHNIQUE: Contiguous axial images were obtained from the base of the skull through the vertex without intravenous contrast. COMPARISON:  Head CTs without contrast 11/27/2012 and earlier. FINDINGS: Brain: Cerebral volume is within normal limits for age. Stable and normal Stable gray-white matter differentiation throughout the brain. No midline shift, ventriculomegaly, mass effect, evidence of mass lesion, intracranial hemorrhage or evidence of cortically based acute infarction. Vascular: Extensive Calcified atherosclerosis at the skull base. No suspicious intracranial vascular hyperdensity. Skull: No acute osseous abnormality identified. Hyperostosis frontalis. Sinuses/Orbits: Visualized paranasal sinuses and mastoids are stable and well pneumatized. Other: No acute orbit or scalp soft tissue finding. ASPECTS ([place] Stroke Program Early CT Score) - Ganglionic level infarction (caudate, lentiform nuclei, internal capsule, insula, M1-M3 cortex): 7 - Supraganglionic infarction (M4-M6 cortex): 3 Total score (0-10 with 10 being normal): 10 IMPRESSION: 1. Stable and normal for age noncontrast CT appearance of the brain. 2. ASPECTS is 10. Electronically Signed: By: Genevie Ann M.D. On: 07/22/2016 15:48     Scheduled Meds: . aspirin EC  81 mg Oral Daily  . bisacodyl  10 mg Rectal Daily  . clopidogrel  75 mg Oral Q breakfast  . enoxaparin (LOVENOX) injection  40 mg Subcutaneous QHS  . fluticasone furoate-vilanterol  1 puff Inhalation Daily  . insulin aspart  0-15 Units Subcutaneous TID WC  . insulin aspart  0-5 Units Subcutaneous QHS  . insulin aspart  15 Units Subcutaneous TID WC  . insulin detemir  40 Units Subcutaneous BID  .  levalbuterol  0.63 mg Nebulization TID  . methylPREDNISolone (SOLU-MEDROL) injection  40 mg Intravenous Q8H  . metoprolol tartrate  12.5 mg Oral BID  . nicotine  21 mg Transdermal Daily  . nystatin   Topical TID  . pantoprazole  80 mg Oral Q1200  . piperacillin-tazobactam (ZOSYN)  IV  3.375 g Intravenous Q8H  . polyethylene glycol  17 g Oral Daily  . polyvinyl alcohol  1 drop Both Eyes BID  . pravastatin  40 mg Oral q1800  . pregabalin  50 mg Oral BID  . senna  2 tablet Oral QHS  . umeclidinium bromide  1 puff Inhalation Daily   Continuous Infusions:    LOS: 2 days    Time spent: 30 min    Janece Canterbury, MD [facility] Pager [phone]  If 7PM-7AM, please contact night-coverage [email or web address] Password [email or web address] 07/24/2016, 12:34 PM

## 2016-07-24 NOTE — Progress Notes (Signed)
Pt states that she would like Hospice with Fatima Blank, RN.  A call was made to [facility].  Information faxed to [phone].

## 2016-07-24 NOTE — Progress Notes (Signed)
  Radiation Oncology         [id number]   Name: [name] MRN: [id number]   Date: 07/24/2016  DOB: 02/16/1946     INPATIENT  Weekly Radiation Therapy Management    ICD-9-CM ICD-10-CM   1. Adenocarcinoma of right lung, stage 3 (HCC) 162.9 C34.91     Current Dose: 3 Gy  Planned Dose:  30 Gy  Narrative The patient presents for routine under treatment assessment.  The patient is doing well so far and is without complaint.  Set-up films were reviewed. The chart was checked.  Physical Findings Weight essentially stable.  No significant changes.  Impression The patient is tolerating radiation.  Plan Continue treatment as planned.         Sheral Apley Tammi Klippel, M.D.  This document serves as a record of services personally performed by Tyler Pita, MD. It was created on his behalf by Arlyce Harman, a trained medical scribe. The creation of this record is based on the scribe's personal observations and the provider's statements to them. This document has been checked and approved by the attending provider.

## 2016-07-24 NOTE — Consult Note (Signed)
Consultation Note Date: 07/24/2016   Patient Name: [name]  DOB: [birth year]  MRN: [id number]  Age / Sex: 70 y.o., female  PCP: Celene Squibb, MD Referring Physician: Janece Canterbury, MD  Reason for Consultation: Establishing goals of care  HPI/Patient Profile: 70 y.o. female    admitted on 07/22/2016     Clinical Assessment and Goals of Care:  69 year old lady with a past medical history significant for recurrent non-small cell lung cancer who lives at home in [place], [place] with her daughter, mother and several grandchildren. Her daughter Lorriane Shire is her healthcare power of attorney agent who is present at the bedside. Patient presented with progressive disease in the right lung with consolidation and collapse of right upper and middle lobe as well as loculated right pleural effusion. She has been followed by oncology in this hospitalization.   A palliative consultation has been requested for goals of care discussions.  Patient is a very pleasant elderly lady resting in bed. Her mother daughter and several grandchildren are present at the bedside. I introduced myself and scope of palliative care as follows: Palliative medicine is specialized medical care for people living with serious illness. It focuses on providing relief from the symptoms and stress of a serious illness. The goal is to improve quality of life for both the patient and the family.  Goals and wishes attempted to be elicited. Patient states that if she can receive palliation treatments and live for another year or so she is about to have a great-grandchild in a few months. She is very clear that she has advanced progressive disease that is non-curative at this point. If at any point she is faced with extensive symptom burden then she would elect full hospice and comfort measures. For now, she elects to pursue radiation oncology  treatments as a palliative measure.  HCPOA  Dorene Ar daughter at [phone] the designated healthcare power of attorney agent.  SUMMARY OF RECOMMENDATIONS    DO NOT RESUSCITATE/DO NOT INTUBATE   patient wishes to proceed with palliative radiation She is hopeful that she will be a candidate for either systemic chemotherapy or immunotherapy after that. She is hopeful for at least having 1 additional year or so to live.  If the burden of these palliative treatments becomes too great or if there are extensive symptoms, at that time, patient wishes to establish with hospice. She does not want to go to hospice home. She is familiar with [facility] from when her husband and son-in-law passed away in hospice care in the past.   Code Status/Advance Care Planning:  DNR    Symptom Management:   current treatments  Palliative Prophylaxis:   Bowel Regimen  Psycho-social/Spiritual:   Desire for further Chaplaincy support:no  Additional Recommendations: Caregiving  Support/Resources  Prognosis:   < 6-9 months?  Discharge Planning: Wishes to go home towards the end of this hospitalization. States she has excellent home support. If continuing with palliative radiation treatments and then either systemic chemotherapy or immunotherapy,  then she will likely go home with home health care and enrolled in hospice later on.      Primary Diagnoses: Present on Admission: . Collapse of right lung . Primary cancer of right lung ([facility]) . Loculated pleural effusion . Acute on chronic respiratory failure with hypoxia ([facility]) . Tobacco abuse . Morbid obesity ([facility])   I have reviewed the medical record, interviewed the patient and family, and examined the patient. The following aspects are pertinent.  Past Medical History:  Diagnosis Date  . Adenocarcinoma of right lung, stage 3 ([facility]) 08/28/2009      . Asthma   . Cholelithiasis   . Chronic bronchitis ([facility])   . Colon  polyps   . COPD (chronic obstructive pulmonary disease) ([facility])    USES O2 AT NIGHT + PRN IN DAYTIME3L  . Coronary artery disease    a. 2005 Cath/PCI: mRCA-> [place] DES;  b. 06/2014 Cath/PCI: EF 60%. LM nl, LAD min irregs, D1 40, LCX 20p, OM2 95 (2.5x14 Resolute DES), RCA 30p, 30 ISR, 20d, PDA/PLA nl..  11/2015  Stenting of the entire RCA and LV branch off the RCA.    . Emphysema   . FH: chemotherapy 2010    had 4 times  . Fibromyalgia   . GERD (gastroesophageal reflux disease)   . H/O hiatal hernia   . Hyperlipidemia   . Hypertension   . Lung cancer ([facility]) 2010   Adenocarcinoma, right lung, node positive  . On home oxygen therapy    "2-4L at night and prn" (07/18/2104)  . Oxygen dependent    at night  3liters  . Peripheral vascular disease ([facility])   . Pneumonia    hx of   . Radiation 6/10   28 times right lung, and 5 treatments to sternum  . Stress incontinence   . Tobacco abuse   . Type II diabetes mellitus ([facility])    Social History   Social History  . Marital status: Widowed    Spouse name: N/A  . Number of children: N/A  . Years of education: N/A   Occupational History  . disabled    Social History Main Topics  . Smoking status: Current Every Day Smoker    Packs/day: 1.00    Years: 53.00    Types: Cigarettes  . Smokeless tobacco: Never Used  . Alcohol use No  . Drug use: No  . Sexual activity: No   Other Topics Concern  . None   Social History Narrative   Daughter lives with her.  Also her mother and grandson also live with her.      Family History  Problem Relation Age of Onset  . Diabetes Mother   . Heart failure Mother   . Diabetes Father   . CAD Father 9  . Arthritis    . Lung disease    . Cancer    . Asthma     Scheduled Meds: . aspirin EC  81 mg Oral Daily  . bisacodyl  10 mg Rectal Daily  . clopidogrel  75 mg Oral Q breakfast  . enoxaparin (LOVENOX) injection  40 mg Subcutaneous QHS  . fluticasone furoate-vilanterol  1 puff Inhalation Daily    . insulin aspart  0-15 Units Subcutaneous TID WC  . insulin aspart  0-5 Units Subcutaneous QHS  . insulin aspart  15 Units Subcutaneous TID WC  . insulin detemir  40 Units Subcutaneous BID  . levalbuterol  0.63 mg Nebulization TID  . methylPREDNISolone (SOLU-MEDROL) injection  40 mg Intravenous BID  . metoprolol tartrate  12.5 mg Oral BID  . nicotine  21 mg Transdermal Daily  . nystatin   Topical TID  . pantoprazole  80 mg Oral Q1200  . piperacillin-tazobactam (ZOSYN)  IV  3.375 g Intravenous Q8H  . polyethylene glycol  17 g Oral BID  . polyvinyl alcohol  1 drop Both Eyes BID  . pravastatin  40 mg Oral q1800  . pregabalin  50 mg Oral BID  . senna  2 tablet Oral QHS  . umeclidinium bromide  1 puff Inhalation Daily   Continuous Infusions:  PRN Meds:.acetaminophen **OR** acetaminophen, bisacodyl, nitroGLYCERIN, ondansetron **OR** ondansetron (ZOFRAN) IV, oxyCODONE Medications Prior to Admission:  Prior to Admission medications   Medication Sig Start Date End Date Taking? Authorizing Provider  aspirin EC 81 MG tablet Take 81 mg by mouth daily.    Yes Historical Provider, MD  Cholecalciferol (VITAMIN D) 1000 UNITS capsule Take 1,000 Units by mouth daily.    Yes Historical Provider, MD  clopidogrel (PLAVIX) 75 MG tablet Take 1 tablet (75 mg total) by mouth daily with breakfast. 11/10/15  Yes Brett Canales, PA-C  empagliflozin (JARDIANCE) 25 MG TABS tablet Take 25 mg by mouth daily.   Yes Historical Provider, MD  esomeprazole (NEXIUM) 40 MG capsule Take 40 mg by mouth as needed (for heartburn/ acid reflux).    Yes Historical Provider, MD  fluticasone furoate-vilanterol (BREO ELLIPTA) 100-25 MCG/INH AEPB Inhale 1 puff into the lungs daily.   Yes Historical Provider, MD  furosemide (LASIX) 20 MG tablet Take 1 tablet (20 mg total) by mouth daily as needed for edema. 06/20/16  Yes Kelvin Cellar, MD  insulin aspart (NOVOLOG) 100 UNIT/ML injection Inject 1,015 Units into the skin as needed. Per  sliding scale: CBG <200 15 units, 200-260 20 units, >260 25 units   Yes Historical Provider, MD  insulin detemir (LEVEMIR) 100 UNIT/ML injection Inject 45-55 Units into the skin 2 (two) times daily. Inject 55 units subcutaneously every morning and 45 units at bedtime   Yes Historical Provider, MD  lisinopril (PRINIVIL,ZESTRIL) 10 MG tablet Take 1 tablet (10 mg total) by mouth daily. 03/11/16  Yes Minus Breeding, MD  lovastatin (MEVACOR) 40 MG tablet Take 40 mg by mouth daily after supper.   Yes Historical Provider, MD  metoprolol tartrate (LOPRESSOR) 25 MG tablet TAKE 1 TABLET BY MOUTH IN THE AM & 1/2 TAB BY MOUTH IN THE PM 05/02/16  Yes Minus Breeding, MD  Polyethyl Glycol-Propyl Glycol (SYSTANE) 0.4-0.3 % SOLN Place 1 drop into both eyes 2 (two) times daily.    Yes Historical Provider, MD  pregabalin (LYRICA) 50 MG capsule Take 50 mg by mouth 2 (two) times daily.    Yes Historical Provider, MD  umeclidinium bromide (INCRUSE ELLIPTA) 62.5 MCG/INH AEPB Inhale 1 puff into the lungs daily.   Yes Historical Provider, MD  acetaminophen (TYLENOL) 500 MG tablet Take 1,000 mg by mouth every 6 (six) hours as needed for mild pain.     Historical Provider, MD  albuterol (PROVENTIL) (2.5 MG/3ML) 0.083% nebulizer solution Take 2.5 mg by nebulization every 6 (six) hours as needed for wheezing or shortness of breath.    Historical Provider, MD  metFORMIN (GLUCOPHAGE) 500 MG tablet Take 1 tablet (500 mg total) by mouth 2 (two) times daily with a meal. 11/10/15   Brett Canales, PA-C  nitroGLYCERIN (NITROSTAT) 0.4 MG SL tablet Place 1 tablet (0.4 mg total) under the tongue every 5 (five)  minutes x 3 doses as needed for chest pain. Patient not taking: Reported on 07/15/2016 06/21/14   Erlene Quan, PA-C   Allergies  Allergen Reactions  . Celebrex [Celecoxib] Palpitations and Other (See Comments)    Chest pain, tachycardia, diaphoresis  . Niacin Rash  . Percocet [Oxycodone-Acetaminophen] Palpitations    Update 07/23/16  - pt reports racing heart with Percocet, but has tolerated oxycodone.  . Varenicline Tartrate Rash    Reaction to [place]   Review of Systems + generalized weakness Physical Exam Elderly lady resting in bed in no acute distress appears pale Diminished breath sounds throughout right lung field S1-S2 Extremities warm to touch no edema Abdomen soft nontender Awake alert oriented answers all questions appropriately  Vital Signs: BP 126/74 (BP Location: Left Arm)   Pulse 86   Temp 97.7 F (36.5 C) (Oral)   Resp 20   Ht '5\' 2"'$  (1.575 m)   Wt 93 kg (205 lb)   SpO2 98%   BMI 37.49 kg/m  Pain Assessment: No/denies pain POSS *See Group Information*: 1-Acceptable,Awake and alert Pain Score: 2    SpO2: SpO2: 98 % O2 Device:SpO2: 98 % O2 Flow Rate: .O2 Flow Rate (L/min): 3 L/min  IO: Intake/output summary:  Intake/Output Summary (Last 24 hours) at 07/24/16 1425 Last data filed at 07/24/16 0951  Gross per 24 hour  Intake             1050 ml  Output             2550 ml  Net            -1500 ml    LBM: Last BM Date: 07/22/16 Baseline Weight: Weight: 93 kg (205 lb) Most recent weight: Weight: 93 kg (205 lb)     Palliative Assessment/Data:   Flowsheet Rows   Flowsheet Row Most Recent Value  Intake Tab  Referral Department  Hospitalist  Unit at Time of Referral  Oncology Unit  Palliative Care Primary Diagnosis  Cancer  Palliative Care Type  New Palliative care  Reason for referral  Clarify Goals of Care  Date first seen by Palliative Care  07/24/16  Clinical Assessment  Palliative Performance Scale Score  40%  Pain Max last 24 hours  4  Pain Min Last 24 hours  3  Dyspnea Max Last 24 Hours  3  Dyspnea Min Last 24 hours  2  Nausea Max Last 24 Hours  3  Nausea Min Last 24 Hours  2  Psychosocial & Spiritual Assessment  Palliative Care Outcomes  Patient/Family meeting held?  Yes  Who was at the meeting?  patient mom daughter grand kids   Palliative Care Outcomes  Clarified  goals of care      Time In:  48 Time Out:  13.10 Time Total:  70 Greater than 50%  of this time was spent counseling and coordinating care related to the above assessment and plan.  Signed by: Loistine Chance, MD  [phone]  Please contact Palliative Medicine Team phone at [phone] for questions and concerns.  For individual provider: See Shea Evans

## 2016-07-25 ENCOUNTER — Ambulatory Visit
Admit: 2016-07-25 | Discharge: 2016-07-25 | Disposition: A | Discharge status: Home / Self Care | Payer: Medicare Other | Attending: Radiation Oncology | Admitting: Radiation Oncology

## 2016-07-25 ENCOUNTER — Other Ambulatory Visit: Payer: Medicare Other

## 2016-07-25 ENCOUNTER — Ambulatory Visit: Payer: Medicare Other | Admitting: Internal Medicine

## 2016-07-25 ENCOUNTER — Telehealth: Payer: Self-pay | Admitting: *Deleted

## 2016-07-25 DIAGNOSIS — C3481 Malignant neoplasm of overlapping sites of right bronchus and lung: Secondary | ICD-10-CM

## 2016-07-25 DIAGNOSIS — Z794 Long term (current) use of insulin: Secondary | ICD-10-CM

## 2016-07-25 DIAGNOSIS — E1159 Type 2 diabetes mellitus with other circulatory complications: Secondary | ICD-10-CM

## 2016-07-25 LAB — CBC
HCT: 34.8 % — ABNORMAL LOW (ref 36.0–46.0)
HEMOGLOBIN: 10.6 g/dL — AB (ref 12.0–15.0)
MCH: 25.1 pg — ABNORMAL LOW (ref 26.0–34.0)
MCHC: 30.5 g/dL (ref 30.0–36.0)
MCV: 82.3 fL (ref 78.0–100.0)
Platelets: 172 10*3/uL (ref 150–400)
RBC: 4.23 MIL/uL (ref 3.87–5.11)
RDW: 18.4 % — AB (ref 11.5–15.5)
WBC: 8.1 10*3/uL (ref 4.0–10.5)

## 2016-07-25 LAB — BASIC METABOLIC PANEL
ANION GAP: 7 (ref 5–15)
BUN: 29 mg/dL — ABNORMAL HIGH (ref 6–20)
CALCIUM: 9.5 mg/dL (ref 8.9–10.3)
CO2: 31 mmol/L (ref 22–32)
Chloride: 101 mmol/L (ref 101–111)
Creatinine, Ser: 0.96 mg/dL (ref 0.44–1.00)
GFR, EST NON AFRICAN AMERICAN: 59 mL/min — AB (ref >60)
GLUCOSE: 279 mg/dL — AB (ref 65–99)
Potassium: 6.2 mmol/L — ABNORMAL HIGH (ref 3.5–5.1)
SODIUM: 139 mmol/L (ref 135–145)

## 2016-07-25 LAB — GLUCOSE, CAPILLARY
GLUCOSE-CAPILLARY: 195 mg/dL — AB (ref 65–99)
GLUCOSE-CAPILLARY: 265 mg/dL — AB (ref 65–99)

## 2016-07-25 LAB — POTASSIUM: POTASSIUM: 4.9 mmol/L (ref 3.5–5.1)

## 2016-07-25 LAB — HEMOGLOBIN A1C
HEMOGLOBIN A1C: 6.3 % — AB (ref 4.8–5.6)
MEAN PLASMA GLUCOSE: 134 mg/dL

## 2016-07-25 MED ORDER — SODIUM CHLORIDE 0.9 % IV SOLN
1.0000 g | Freq: Once | INTRAVENOUS | Status: AC
  Administered 2016-07-25: 1 g via INTRAVENOUS
  Filled 2016-07-25: qty 10

## 2016-07-25 MED ORDER — SENNA 8.6 MG PO TABS: 2.0000 | ORAL_TABLET | Freq: Every day | ORAL | 0 refills | Status: AC

## 2016-07-25 MED ORDER — AMOXICILLIN-POT CLAVULANATE 875-125 MG PO TABS: 1.0000 | ORAL_TABLET | Freq: Two times a day (BID) | ORAL | 0 refills | Status: DC

## 2016-07-25 MED ORDER — PREDNISONE 20 MG PO TABS
60.0000 mg | ORAL_TABLET | Freq: Every day | ORAL | Status: DC
  Administered 2016-07-25: 60 mg via ORAL
  Filled 2016-07-25 (×2): qty 3

## 2016-07-25 MED ORDER — AMOXICILLIN-POT CLAVULANATE 875-125 MG PO TABS
1.0000 | ORAL_TABLET | Freq: Two times a day (BID) | ORAL | Status: DC
  Administered 2016-07-25: 1 via ORAL
  Filled 2016-07-25: qty 1

## 2016-07-25 MED ORDER — NYSTATIN 100000 UNIT/GM EX POWD: Freq: Three times a day (TID) | CUTANEOUS | 0 refills | Status: AC

## 2016-07-25 MED ORDER — BISACODYL 10 MG RE SUPP: 10.0000 mg | Freq: Every day | RECTAL | 0 refills | Status: AC | PRN

## 2016-07-25 MED ORDER — POLYETHYLENE GLYCOL 3350 17 G PO PACK: 17.0000 g | PACK | Freq: Two times a day (BID) | ORAL | 0 refills | Status: DC

## 2016-07-25 MED ORDER — PREDNISONE 20 MG PO TABS: ORAL_TABLET | ORAL | 0 refills | Status: DC

## 2016-07-25 MED ORDER — OXYCODONE HCL 10 MG PO TABS: 10.0000 mg | ORAL_TABLET | ORAL | 0 refills | Status: AC | PRN

## 2016-07-25 MED ORDER — INSULIN DETEMIR 100 UNIT/ML ~~LOC~~ SOLN
45.0000 [IU] | Freq: Two times a day (BID) | SUBCUTANEOUS | Status: DC
  Administered 2016-07-25: 45 [IU] via SUBCUTANEOUS
  Filled 2016-07-25 (×2): qty 0.45

## 2016-07-25 NOTE — Discharge Summary (Addendum)
Physician Discharge Summary  [name] XUX:[id number] DOB: 1946-07-08 DOA: 07/22/2016  PCP: Rita Neighbors, MD  Admit date: 07/22/2016 Discharge date: 07/25/2016  Admitted From: home  Disposition:  home  Recommendations for Outpatient Follow-up:  1. Follow up with Pulmonology in 2 weeks 2. Follow up with Radiation treatments according to provided schedule 3. Follow up with Dr. Julien Nordmann at next scheduled appointment 4. PCP in 1-2 weeks for reevaluation:  Please start to gradually taper her insulin to prevent hypoglycemia. 5. Augmentin to continue for 5 more days  6. Prednisone taper  Home Health:  Yes, PT and RN  Equipment/Devices:  Oxygen increased to 3L at rest with 4L with exertion  Discharge Condition:  Stable, improved CODE STATUS:  DNR  Diet recommendation:  regular   Brief/Interim Summary:  Rita Steinhardt Wittyis a 70 y.o.femalewith medical history significant forstage III adenocarcinoma of the right lung, status post radiation and chemotherapy, CAD, status post stenting in 2015 and in 2017, COPD causing oxygen dependent respiratory failure-requiring 2-4 L of oxygen, ongoing tobacco abuse, and insulin requiring diabetes mellitus, who presented to the ED with a one-month history of progressive shortness of breath.  CT scan of her chest ordered by her oncologist on 07/18/16 revealed progression of metastatic lung cancer as evidenced by extensive collapse/consolidation throughout the right lung and a small loculated right pleural effusion and new/enlarging mediastinal/upper abdominal lymph nodes.  She was admitted to the stepdown unit for respiratory distress and started on steroids and antibiotics for possible loculated effusion.  Transferred to floor on 10/19.  She had ongoing orthopnea and dyspnea with exertion.  She was seen by her oncologist, Dr. Julien Nordmann who recommended palliative chemotherapy and radiation vs. Hospice care.  Patient met with palliative care and decided to pursue  palliative treatments for now because she has another [place] on the way.  She started XRT treatments during her hospitalization and will need to follow up next week for ongoing therapy.  She will see Dr. Julien Nordmann in clinic in about a week to discuss her treatment options.  She was given antibiotics and steroids for home and her oxygen was increased slightly.  For pain control for her right chest pain, she was started on oxycodone with a bowel regimen.  She is familiar with hospice and has selected a hospice agency and person for when she has completed her palliative treatments.  She is DNR.    Discharge Diagnoses:  Principal Problem:   Collapse of right lung Active Problems:   Type II diabetes mellitus (HCC)   Tobacco abuse   Morbid obesity (HCC)   Acute on chronic respiratory failure with hypoxia (HCC)   Primary cancer of right upper lobe of lung (HCC)   Loculated pleural effusion   Malignant neoplasm of right lung (HCC)   Transient cerebral ischemia  Acute on chronic respiratory failure with hypoxia secondary to progression of metastatic lung cancer, possible COPD exacerbation or post-obstructive pneumonia.  Effusion is likely malignant and is small.  She remained afebrile and without leukocytosis.  -  Solumedrol was converted to oral Prednisone taper -  Continued bronchodilators -  Continued Breo -  Vanc and zosyn were narrowed to monotherapy zosyn once MRSA PCR was negative.  Discharged on augmentin to continue for 5 more days.  TIA, had transient right facial droop while en route to [employer] [facility] [employer] -  MRI brain negative for stroke, no evidence of metastases -  CT angiogram of the head and neck demonstrated moderate ICA stenosis, but less  than 70% -  Echocardiogram demonstrated a severely calcified MV, preserved EF and grade 2 DD  -  Patient is already on aspirin and Plavix for her CAD -  Lipid panel LDL 93 and A1c 6.3 -  Given progression of her metastatic disease, unclear benefit of  statin medication at this time  Primary cancer of the right lung, previously in remission for the last 8 years -  Appreciate Dr. Worthy Flank assistance -  DO NOT RESUSCITATE -  Pursuing palliative radiation and chemotherapy -  Pain control:  PRN Oxycodone 10 mg, RX given for 30 tabs  Narcotic induced constipation -  Rx for MiraLAX, senna -  continued PRN bisacodyl  Insulin-dependent diabetes mellitus 2, hyperglycemia secondary to reduced insulin in the setting of steroids -  Resumed home dose of insulin for now because of steroid taper, but will likely need to have her insulin doses reduced given her rapidly declining A1c in the setting of cancer and anticipated weight loss/anorexia.    CAD, chest pain atypical and related to malignancy - continued aspirin, plavix, statin  Mitral valve calcification with possible mobile calcification on the mitral valve.  Doubt endocarditis due to lack of fevers and negative blood cultures.  Patient NOT a good candidate for sedation for TEE due to COPD and obstructive malignancy of the right lung.  Unable to lay flat.   -  Follow up with PCP and cardiology if felt indicated.  Hypertension, blood pressures low normal -  Stopped her ACEI -  Close monitoring of BP as outpatient to stop or taper her other BP medication if necessary.  Ongoing tobacco abuse, encouraged cessation  Hyperkalemia, was spurious.  Patient may resume potassium supplementation  Discharge Instructions  Discharge Instructions    Call MD for:  difficulty breathing, headache or visual disturbances    Complete by:  As directed    Call MD for:  extreme fatigue    Complete by:  As directed    Call MD for:  hives    Complete by:  As directed    Call MD for:  persistant dizziness or light-headedness    Complete by:  As directed    Call MD for:  persistant nausea and vomiting    Complete by:  As directed    Call MD for:  severe uncontrolled pain    Complete by:  As directed     Call MD for:  temperature >100.4    Complete by:  As directed    Diet general    Complete by:  As directed    Increase activity slowly    Complete by:  As directed        Medication List    STOP taking these medications   lisinopril 10 MG tablet Commonly known as:  PRINIVIL,ZESTRIL     TAKE these medications   acetaminophen 500 MG tablet Commonly known as:  TYLENOL Take 1,000 mg by mouth every 6 (six) hours as needed for mild pain.   albuterol (2.5 MG/3ML) 0.083% nebulizer solution Commonly known as:  PROVENTIL Take 2.5 mg by nebulization every 6 (six) hours as needed for wheezing or shortness of breath.   amoxicillin-clavulanate 875-125 MG tablet Commonly known as:  AUGMENTIN Take 1 tablet by mouth every 12 (twelve) hours.   aspirin EC 81 MG tablet Take 81 mg by mouth daily.   bisacodyl 10 MG suppository Commonly known as:  DULCOLAX Place 1 suppository (10 mg total) rectally daily as needed for mild constipation or  moderate constipation.   BREO ELLIPTA 100-25 MCG/INH Aepb Generic drug:  fluticasone furoate-vilanterol Inhale 1 puff into the lungs daily.   clopidogrel 75 MG tablet Commonly known as:  PLAVIX Take 1 tablet (75 mg total) by mouth daily with breakfast.   esomeprazole 40 MG capsule Commonly known as:  NEXIUM Take 40 mg by mouth as needed (for heartburn/ acid reflux).   furosemide 20 MG tablet Commonly known as:  LASIX Take 1 tablet (20 mg total) by mouth daily as needed for edema.   INCRUSE ELLIPTA 62.5 MCG/INH Aepb Generic drug:  umeclidinium bromide Inhale 1 puff into the lungs daily.   insulin aspart 100 UNIT/ML injection Commonly known as:  novoLOG Inject 1,015 Units into the skin as needed. Per sliding scale: CBG <200 15 units, 200-260 20 units, >260 25 units   insulin detemir 100 UNIT/ML injection Commonly known as:  LEVEMIR Inject 45-55 Units into the skin 2 (two) times daily. Inject 55 units subcutaneously every morning and 45 units at  bedtime   JARDIANCE 25 MG Tabs tablet Generic drug:  empagliflozin Take 25 mg by mouth daily.   lovastatin 40 MG tablet Commonly known as:  MEVACOR Take 40 mg by mouth daily after supper.   LYRICA 50 MG capsule Generic drug:  pregabalin Take 50 mg by mouth 2 (two) times daily.   metFORMIN 500 MG tablet Commonly known as:  GLUCOPHAGE Take 1 tablet (500 mg total) by mouth 2 (two) times daily with a meal.   metoprolol tartrate 25 MG tablet Commonly known as:  LOPRESSOR TAKE 1 TABLET BY MOUTH IN THE AM & 1/2 TAB BY MOUTH IN THE PM   nitroGLYCERIN 0.4 MG SL tablet Commonly known as:  NITROSTAT Place 1 tablet (0.4 mg total) under the tongue every 5 (five) minutes x 3 doses as needed for chest pain.   nystatin powder Commonly known as:  MYCOSTATIN/NYSTOP Apply topically 3 (three) times daily.   Oxycodone HCl 10 MG Tabs Take 1 tablet (10 mg total) by mouth every 4 (four) hours as needed for severe pain.   polyethylene glycol packet Commonly known as:  MIRALAX / GLYCOLAX Take 17 g by mouth 2 (two) times daily.   predniSONE 20 MG tablet Commonly known as:  DELTASONE Take 3 tabs daily x 2 days, then 2 tabs daily x 2 days, then 1 tab daily x 2 days, then stop   senna 8.6 MG Tabs tablet Commonly known as:  SENOKOT Take 2 tablets (17.2 mg total) by mouth at bedtime.   SYSTANE 0.4-0.3 % Soln Generic drug:  Polyethyl Glycol-Propyl Glycol Place 1 drop into both eyes 2 (two) times daily.   Vitamin D 1000 units capsule Take 1,000 Units by mouth daily.      Follow-up Information    Rita Neighbors, MD. Schedule an appointment as soon as possible for a visit in 1 month(s).   Specialty:  Internal Medicine Contact information: [place] [address] [phone]        [facility]. Schedule an appointment as soon as possible for a visit in 2 week(s).   Specialty:  Pulmonology Why:  Dr. Elsworth Soho patient.  Any provider for follow up. Contact  information: [place] .   Specialty:  Oncology Why:  already scheduled appointment Contact information: [address] [place] [address] [phone]  Allergies  Allergen Reactions  . Celebrex [Celecoxib] Palpitations and Other (See Comments)    Chest pain, tachycardia, diaphoresis  . Niacin Rash  . Percocet [Oxycodone-Acetaminophen] Palpitations    Update 07/23/16 - pt reports racing heart with Percocet, but has tolerated oxycodone.  . Varenicline Tartrate Rash    Reaction to [place]    Consultations:  Dr. Julien Nordmann, oncology  Dr. Tammi Klippel, Radiation oncology  Dr. Rowe Pavy, Palliative care   Procedures/Studies: Ct Angio Head W Or Wo Contrast  Result Date: 07/22/2016 CLINICAL DATA:  Right-sided facial droop EXAM: CT ANGIOGRAPHY HEAD AND NECK TECHNIQUE: Multidetector CT imaging of the head and neck was performed using the standard protocol during bolus administration of intravenous contrast. Multiplanar CT image reconstructions and MIPs were obtained to evaluate the vascular anatomy. Carotid stenosis measurements (when applicable) are obtained utilizing NASCET criteria, using the distal internal carotid diameter as the denominator. CONTRAST:  100 mL Isovue 370 IV COMPARISON:  Head CT 07/22/2016 FINDINGS: CTA NECK FINDINGS Aortic arch: There is a normal variant aortic arch branching pattern with the brachiocephalic and left common carotid arteries sharing a common origin. The visualized bilateral proximal subclavian arteries are normal. There is atherosclerotic calcification of the aortic arch. Right carotid system: There is atherosclerotic plaque at the right carotid bifurcation resulting in 60 percent stenosis. The remainder of the cervical right ICA is normal. Left carotid system: There is atherosclerotic plaque and calcification of the proximal left ICA resulting in 50 percent  stenosis. Vertebral arteries: The right vertebral artery origin and V1 segment are not visualized. The diminutive right vertebral artery is 1st identified at the C3 level. The remainder of its V2 and V3 segments are normal. There is atherosclerotic calcification the narrows the lumen as it enters the foramen magnum, but a remains patent to the confluence with the basilar artery. The left vertebral artery is dominant and normal along its entire course. There is atherosclerotic calcification that narrows its origin. Skeleton: Anterior cervical fusion hardware at C5-6. Other neck: Unremarkable Upper chest: There is complete opacification of the right upper lobe associated collapse and large pleural effusion. This is better characterized on recent chest CT. Review of the MIP images confirms the above findings CTA HEAD FINDINGS Anterior circulation: --Intracranial internal carotid arteries: There is atherosclerotic calcification of the proximal intracranial internal carotid artery is without greater than 50 percent stenosis. --Anterior cerebral arteries: Normal. --Middle cerebral arteries: Normal. --Posterior communicating arteries: Present bilaterally, larger on the right. Posterior circulation: --Posterior cerebral arteries: Fetal origin of the right PCA. Normal left PCA. --Superior cerebellar arteries: Normal. --Basilar artery: Normal. --Anterior inferior cerebellar arteries: Normal. --Posterior inferior cerebellar arteries: Normal. Venous sinuses: As permitted by contrast timing, patent. Anatomic variants: None Delayed phase: Not performed Review of the MIP images confirms the above findings IMPRESSION: 1. No occlusion or high-grade stenosis of the intracranial arteries. 2. Bilateral proximal internal carotid artery stenosis, measuring 60 percent on the right and 50 percent on the left. 3. Diminutive right vertebral artery with lack of opacification below the C3 level. This may be congenital or secondary to chronic  occlusion/stenosis. Electronically Signed   By: Ulyses Jarred M.D.   On: 07/22/2016 16:29   Dg Chest 2 View  Result Date: 07/22/2016 CLINICAL DATA:  Increased shortness of breath for 1 week. EXAM: CHEST  2 VIEW COMPARISON:  06/27/2016 and CT from 07/18/2016 FINDINGS: The heart size appears normal. There is near complete opacification of the entire right hemi thorax with a small focus of aerated lung  in the right upper lung zone. There is subsegmental atelectasis within the left base. When compared with the previous chest radiograph there has been significantly diminished aeration to the entire right hemi thorax. Today's imaging findings correlate with CT appearance of the right lung from 07/18/2016. IMPRESSION: 1. Markedly diminished aeration to the right lung when compared with 06/27/2016. Findings most likely reflect progression of lung cancer. Electronically Signed   By: Kerby Moors M.D.   On: 07/22/2016 12:05   Dg Chest 2 View  Result Date: 06/27/2016 CLINICAL DATA:  Shortness of Breath EXAM: CHEST  2 VIEW COMPARISON:  06/19/2016 FINDINGS: Cardiomegaly again noted. Stable postsurgical and postradiation changes right lung post right upper lobectomy. No infiltrate or pulmonary edema. Osteopenia and mild degenerative changes thoracic spine. IMPRESSION: No active disease. Stable postradiation changes and postoperative changes right hemi thorax. Electronically Signed   By: Lahoma Crocker M.D.   On: 06/27/2016 16:48   Ct Angio Neck W Or Wo Contrast  Result Date: 07/22/2016 CLINICAL DATA:  Right-sided facial droop EXAM: CT ANGIOGRAPHY HEAD AND NECK TECHNIQUE: Multidetector CT imaging of the head and neck was performed using the standard protocol during bolus administration of intravenous contrast. Multiplanar CT image reconstructions and MIPs were obtained to evaluate the vascular anatomy. Carotid stenosis measurements (when applicable) are obtained utilizing NASCET criteria, using the distal internal  carotid diameter as the denominator. CONTRAST:  100 mL Isovue 370 IV COMPARISON:  Head CT 07/22/2016 FINDINGS: CTA NECK FINDINGS Aortic arch: There is a normal variant aortic arch branching pattern with the brachiocephalic and left common carotid arteries sharing a common origin. The visualized bilateral proximal subclavian arteries are normal. There is atherosclerotic calcification of the aortic arch. Right carotid system: There is atherosclerotic plaque at the right carotid bifurcation resulting in 60 percent stenosis. The remainder of the cervical right ICA is normal. Left carotid system: There is atherosclerotic plaque and calcification of the proximal left ICA resulting in 50 percent stenosis. Vertebral arteries: The right vertebral artery origin and V1 segment are not visualized. The diminutive right vertebral artery is 1st identified at the C3 level. The remainder of its V2 and V3 segments are normal. There is atherosclerotic calcification the narrows the lumen as it enters the foramen magnum, but a remains patent to the confluence with the basilar artery. The left vertebral artery is dominant and normal along its entire course. There is atherosclerotic calcification that narrows its origin. Skeleton: Anterior cervical fusion hardware at C5-6. Other neck: Unremarkable Upper chest: There is complete opacification of the right upper lobe associated collapse and large pleural effusion. This is better characterized on recent chest CT. Review of the MIP images confirms the above findings CTA HEAD FINDINGS Anterior circulation: --Intracranial internal carotid arteries: There is atherosclerotic calcification of the proximal intracranial internal carotid artery is without greater than 50 percent stenosis. --Anterior cerebral arteries: Normal. --Middle cerebral arteries: Normal. --Posterior communicating arteries: Present bilaterally, larger on the right. Posterior circulation: --Posterior cerebral arteries: Fetal  origin of the right PCA. Normal left PCA. --Superior cerebellar arteries: Normal. --Basilar artery: Normal. --Anterior inferior cerebellar arteries: Normal. --Posterior inferior cerebellar arteries: Normal. Venous sinuses: As permitted by contrast timing, patent. Anatomic variants: None Delayed phase: Not performed Review of the MIP images confirms the above findings IMPRESSION: 1. No occlusion or high-grade stenosis of the intracranial arteries. 2. Bilateral proximal internal carotid artery stenosis, measuring 60 percent on the right and 50 percent on the left. 3. Diminutive right vertebral artery with lack of  opacification below the C3 level. This may be congenital or secondary to chronic occlusion/stenosis. Electronically Signed   By: Ulyses Jarred M.D.   On: 07/22/2016 16:29   Ct Chest W Contrast  Result Date: 07/19/2016 CLINICAL DATA:  Right lung cancer. EXAM: CT CHEST WITH CONTRAST TECHNIQUE: Multidetector CT imaging of the chest was performed during intravenous contrast administration. CONTRAST:  84m ISOVUE-300 IOPAMIDOL (ISOVUE-300) INJECTION 61% COMPARISON:  11/08/2015. FINDINGS: Cardiovascular: Atherosclerotic calcification of the arterial vasculature, including three-vessel involvement of the coronary arteries. Heart size normal. No pericardial effusion. Mediastinum/Nodes: Mediastinal lymph nodes measure up to 1.7 cm in the low right paratracheal station and AP window, previously 5 mm. Subcarinal lymph node measures 1.7 cm, previously 6 mm. No left hilar adenopathy. Right hilum is difficult to assess due to obscuration by extensive consolidation. No axillary adenopathy. Mid/distal esophagus appears thickened, similar. Lungs/Pleura: Postoperative changes of right upper lobectomy with collapse/consolidation involving nearly the entire right lung. There is an area of low attenuation contained within the consolidation, measuring 2.2 cm (image 35). Obscuration of previously seen radiation therapy changes  in the medial right hemi thorax. Small partially loculated right pleural effusion, new. Upper Abdomen: Visualized portions of the liver, adrenal glands, right kidney unremarkable. Tiny left renal stone. Visualized portions of the spleen, pancreas, stomach unremarkable. There are enlarged gastrohepatic ligament and retroperitoneal lymph nodes. Index gastrohepatic ligament lymph node measures 1.3 cm and index aortocaval lymph node measures 1.3 cm. Juxta diaphragmatic lymph nodes measure up to 8 mm, new. Musculoskeletal: No worrisome lytic or sclerotic lesions. Postoperative changes in the left humerus and lower cervical spine. Degenerative changes are seen in the spine. IMPRESSION: 1. Progression of metastatic lung cancer as evidenced by extensive collapse/consolidation throughout the right lung, a small loculated right pleural effusion and new/enlarging mediastinal/upper abdominal lymph nodes. 2. Aortic atherosclerosis (ICD10-170.0). Coronary artery calcification. 3. Mid/distal esophageal wall thickening, similar. 4. Tiny left renal stone. Electronically Signed   By: MLorin PicketM.D.   On: 07/19/2016 08:36   Mr Brain Wo Contrast  Result Date: 07/22/2016 CLINICAL DATA:  Shortness of breath and right-sided facial droop. EXAM: MRI HEAD WITHOUT CONTRAST TECHNIQUE: Multiplanar, multiecho pulse sequences of the brain and surrounding structures were obtained without intravenous contrast. COMPARISON:  Head CT 07/22/2016 and brain MRI 11/15/2008 FINDINGS: The examination had to be discontinued prior to completion due to the patient's inability to tolerate further imaging. Brain: No acute infarct or intraparenchymal hemorrhage. The midline structures are normal. No focal parenchymal signal abnormality. No mass lesion or midline shift. No hydrocephalus or extra-axial fluid collection. Vascular: Major intracranial arterial and venous sinus flow voids are preserved. No evidence of chronic microhemorrhage or amyloid  angiopathy. Skull and upper cervical spine: The visualized skull base, calvarium, upper cervical spine and extracranial soft tissues are normal. Sinuses/Orbits: No fluid levels or advanced mucosal thickening. No mastoid effusion. Normal orbits. IMPRESSION: Normal brain MRI for age. Electronically Signed   By: KUlyses JarredM.D.   On: 07/22/2016 17:23   Ct Head Code Stroke Wo Contrast  Addendum Date: 07/22/2016   ADDENDUM REPORT: 07/22/2016 15:59 ADDENDUM: Study discussed by telephone with Dr. JJeneen RinksOn 07/22/2016 at 1555 hours. Electronically Signed   By: HGenevie AnnM.D.   On: 07/22/2016 15:59   Result Date: 07/22/2016 CLINICAL DATA:  Code stroke. 70year old female with sudden change in mental status, left facial droop and left extremity weakness. Initial encounter. EXAM: CT HEAD WITHOUT CONTRAST TECHNIQUE: Contiguous axial images were obtained from the base of  the skull through the vertex without intravenous contrast. COMPARISON:  Head CTs without contrast 11/27/2012 and earlier. FINDINGS: Brain: Cerebral volume is within normal limits for age. Stable and normal Stable gray-white matter differentiation throughout the brain. No midline shift, ventriculomegaly, mass effect, evidence of mass lesion, intracranial hemorrhage or evidence of cortically based acute infarction. Vascular: Extensive Calcified atherosclerosis at the skull base. No suspicious intracranial vascular hyperdensity. Skull: No acute osseous abnormality identified. Hyperostosis frontalis. Sinuses/Orbits: Visualized paranasal sinuses and mastoids are stable and well pneumatized. Other: No acute orbit or scalp soft tissue finding. ASPECTS ([place] Stroke Program Early CT Score) - Ganglionic level infarction (caudate, lentiform nuclei, internal capsule, insula, M1-M3 cortex): 7 - Supraganglionic infarction (M4-M6 cortex): 3 Total score (0-10 with 10 being normal): 10 IMPRESSION: 1. Stable and normal for age noncontrast CT appearance of the brain. 2.  ASPECTS is 10. Electronically Signed: By: Genevie Ann M.D. On: 07/22/2016 15:48    Subjective: Still feeling SOB with exertion and having orthopnea, but better than before and anxious to go home.  Would like to have her afternoon radiation treatment and then go home.    Discharge Exam: Vitals:   07/24/16 2205 07/25/16 0412  BP: (!) 116/57 113/60  Pulse: 91 93  Resp: 20 20  Temp: 98 F (36.7 C) 98.6 F (37 C)   Vitals:   07/24/16 2205 07/25/16 0412 07/25/16 0732 07/25/16 0749  BP: (!) 116/57 113/60    Pulse: 91 93    Resp: 20 20    Temp: 98 F (36.7 C) 98.6 F (37 C)    TempSrc: Oral Oral    SpO2: 96% 95% 94% 94%  Weight:      Height:        General exam:  Adult female.  No acute distress  HEENT:  NCAT, MMM Respiratory system:  Diminished sounds throughout the right lung fields, no focal rales. No wheezes or rhonchi  Cardiovascular system: Regular rate and rhythm, normal S1/S2. No murmurs, rubs, gallops or clicks.  Warm extremities Gastrointestinal system: Normal active bowel sounds, soft, nondistended, nontender. MSK:  Normal tone and bulk, no lower extremity edema Neuro:  Grossly intact    The results of significant diagnostics from this hospitalization (including imaging, microbiology, ancillary and laboratory) are listed below for reference.     Microbiology: Recent Results (from the past 240 hour(s))  MRSA PCR Screening     Status: None   Collection Time: 07/22/16  6:05 PM  Result Value Ref Range Status   MRSA by PCR NEGATIVE NEGATIVE Final    Comment:        The GeneXpert MRSA Assay (FDA approved for NASAL specimens only), is one component of a comprehensive MRSA colonization surveillance program. It is not intended to diagnose MRSA infection nor to guide or monitor treatment for MRSA infections.      Labs: BNP (last 3 results)  Recent Labs  11/09/15 1330  BNP 12.4   Basic Metabolic Panel:  Recent Labs Lab 07/22/16 1108 07/23/16 0315  07/24/16 0501 07/25/16 0357 07/25/16 0750  NA 139 138 138 139  --   K 3.8 4.7 5.6* 6.2* 4.9  CL 103 105 105 101  --   CO2 _0 --   GLUCOSE 145* 196* 235* 279*  --   BUN 16 17 28* 29*  --   CREATININE 0.77 0.75 0.82 0.96  --   CALCIUM 8.8* 9.1 9.2 9.5  --    Liver Function Tests:  Recent Labs Lab 07/22/16 1108 07/23/16 0315  AST 18 20  ALT 13* 16  ALKPHOS 58 61  BILITOT 0.6 1.2  PROT 6.4* 6.8  ALBUMIN 3.4* 3.5   No results for input(s): LIPASE, AMYLASE in the last 168 hours. No results for input(s): AMMONIA in the last 168 hours. CBC:  Recent Labs Lab 07/22/16 1108 07/23/16 0315 07/24/16 0501 07/25/16 0357  WBC 5.0 5.7 9.5 8.1  NEUTROABS 3.2  --   --   --   HGB 11.0* 11.4* 10.9* 10.6*  HCT 37.0 38.1 36.4 34.8*  MCV 83.3 83.0 82.2 82.3  PLT 164 193 214 172   Cardiac Enzymes:  Recent Labs Lab 07/22/16 1108  TROPONINI <0.03   BNP: Invalid input(s): POCBNP CBG:  Recent Labs Lab 07/24/16 1418 07/24/16 1701 07/24/16 2203 07/25/16 0747 07/25/16 1146  GLUCAP 217* 193* 156* 195* 265*   D-Dimer No results for input(s): DDIMER in the last 72 hours. Hgb A1c  Recent Labs  07/24/16 0501  HGBA1C 6.3*   Lipid Profile  Recent Labs  07/24/16 0501  CHOL 153  HDL 38*  LDLCALC 93  TRIG 112  CHOLHDL 4.0   Thyroid function studies No results for input(s): TSH, T4TOTAL, T3FREE, THYROIDAB in the last 72 hours.  Invalid input(s): FREET3 Anemia work up No results for input(s): VITAMINB12, FOLATE, FERRITIN, TIBC, IRON, RETICCTPCT in the last 72 hours. Urinalysis    Component Value Date/Time   COLORURINE YELLOW 06/17/2016 1023   APPEARANCEUR CLEAR 06/17/2016 1023   LABSPEC 1.020 06/17/2016 1023   PHURINE 5.5 06/17/2016 1023   GLUCOSEU >1000 (A) 06/17/2016 1023   HGBUR NEGATIVE 06/17/2016 1023   BILIRUBINUR NEGATIVE 06/17/2016 1023   KETONESUR NEGATIVE 06/17/2016 1023   PROTEINUR NEGATIVE 06/17/2016 1023   UROBILINOGEN 0.2 06/07/2015 1122    NITRITE POSITIVE (A) 06/17/2016 1023   LEUKOCYTESUR NEGATIVE 06/17/2016 1023   Sepsis Labs Invalid input(s): PROCALCITONIN,  WBC,  LACTICIDVEN   Time coordinating discharge: Over 30 minutes  SIGNED:   Janece Canterbury, MD  [facility] 07/25/2016, 2:14 PM Pager   If 7PM-7AM, please contact night-coverage [email or web address] Password [email or web address]

## 2016-07-25 NOTE — Evaluation (Signed)
Physical Therapy One Time Evaluation Patient Details Name: [name] MRN: [id number] DOB: 06/17/1946 Today's Date: 07/25/2016   History of Present Illness  70 y.o. 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  CT scan of her chest ordered by her oncologist on 07/18/16 revealed progression of metastatic lung cancer as evidenced by extensive collapse/consolidation throughout the right lung and a small loculated right pleural effusion and new/enlarging mediastinal/upper abdominal lymph nodes.  Admitted with Acute on chronic respiratory failure with hypoxia  Clinical Impression  Patient evaluated by Physical Therapy with no further acute PT needs identified. All education has been completed and the patient has no further questions.  Pt to have radiation later today and d/c home.  Pt agreeable to work with PT.  Pt ambulated to bathroom on 3L O2 and very SOB requiring seated rest break prior to ambulating in hallway.  Pt with increased work of breathing and 91% with 4L O2 [place] during ambulation.  Discussed long connecting oxygen tubing would decrease flow and demonstrated feel of flow against pt's skin for one line vs 2 extra lines on same 3L.  Pt would benefit from HHPT as she wishes to remain active and as independent as possible. PT is signing off. Thank you for this referral.     Follow Up Recommendations Home health PT    Equipment Recommendations  None recommended by PT    Recommendations for Other Services       Precautions / Restrictions Precautions Precautions: Other (comment) Precaution Comments: monitor sats, chronic oxygen       Mobility  Bed Mobility Overal bed mobility: Needs Assistance Bed Mobility: Supine to Sit;Sit to Supine     Supine to sit: HOB elevated;Min guard Sit to supine: Supervision;HOB elevated   General bed mobility comments: pt has hospital bed at home, grandson assisted pt to sitting - pt self assisted with use of his arms for support  Transfers Overall transfer level: Needs assistance Equipment used: None Transfers: Sit to/from Stand Sit to Stand: Min guard;Supervision            Ambulation/Gait Ambulation/Gait assistance: Min guard;Supervision Ambulation Distance (Feet): 100 Feet Assistive device: Rolling walker (2 wheeled) Gait Pattern/deviations: Step-through pattern;Decreased stride length     General Gait Details: pt able to push her O2 tank, SPO2 91% on 4L O2, pt required 2-3 standing breathing rest break, able to recall pursed lip breathing technique  Stairs            Wheelchair Mobility    Modified Rankin (Stroke Patients Only)       Balance                                             Pertinent Vitals/Pain Pain Assessment: No/denies pain    Home Living Family/patient expects to be discharged to:: Private residence Living Arrangements: Children (mother, daughter, grandson) Available Help at Discharge: Family Type of Home: House Home Access: Ramped entrance     Home Layout: One level Home Equipment: Shower seat;Bedside commode;Walker - 2 wheels;Wheelchair - manual  Prior Function Level of Independence: Independent with assistive device(s)               Hand Dominance        Extremity/Trunk Assessment               Lower Extremity Assessment: Generalized weakness         Communication   Communication: No difficulties  Cognition Arousal/Alertness: Awake/alert Behavior During Therapy: WFL for tasks assessed/performed Overall Cognitive Status: Within Functional Limits for tasks assessed                       General Comments      Exercises     Assessment/Plan    PT Assessment All further PT needs can be met in the next venue of care  PT Problem List Decreased strength;Decreased activity tolerance;Cardiopulmonary status limiting activity          PT Treatment Interventions      PT Goals (Current goals can be found in the Care Plan section)  Acute Rehab PT Goals PT Goal Formulation: All assessment and education complete, DC therapy    Frequency     Barriers to discharge        Co-evaluation               End of Session Equipment Utilized During Treatment: Oxygen Activity Tolerance: Patient tolerated treatment well Patient left: in bed;with call bell/phone within reach Nurse Communication: Mobility status         Time: 1255-1316 PT Time Calculation (min) (ACUTE ONLY): 21 min   Charges:   PT Evaluation $PT Eval Low Complexity: 1 Procedure     PT G Codes:        Gurnie Duris,KATHrine E 07/25/2016, 2:24 PM Carmelia Bake, PT, DPT 07/25/2016 Pager: [phone]

## 2016-07-25 NOTE — Progress Notes (Signed)
Pharmacy Antibiotic Note  SCOTT FIX is a 70 y.o. female admitted on 07/22/2016 with hypoxic respiratory failure secondary to progression of metastatic lung cancer, possible COPD exacerbation, and possible infected loculated pleural effusion.  Pharmacy has been consulted for Vancomycin and Zosyn dosing.  Afebrile, WBC remains WNL SCr 0.96 with CrCl ~ 58 ml/min   Plan:  Continue Zosyn 3.375g IV Q8H infused over 4hrs.  Follow up renal fxn, culture results,  and clinical course.   Dosage will likely remain stable at above dose and need for further dosage adjustment appears unlikely at present.    Will sign off at this time.  Please reconsult if a change in clinical status warrants re-evaluation of dosage.   Height: '5\' 2"'$  (157.5 cm) Weight: 205 lb (93 kg) IBW/kg (Calculated) : 50.1  Temp (24hrs), Avg:98.1 F (36.7 C), Min:97.7 F (36.5 C), Max:98.6 F (37 C)   Recent Labs Lab 07/22/16 1108 07/23/16 0315 07/24/16 0501 07/25/16 0357  WBC 5.0 5.7 9.5 8.1  CREATININE 0.77 0.75 0.82 0.96    Estimated Creatinine Clearance: 57.9 mL/min (by C-G formula based on SCr of 0.96 mg/dL).    Allergies  Allergen Reactions  . Celebrex [Celecoxib] Palpitations and Other (See Comments)    Chest pain, tachycardia, diaphoresis  . Niacin Rash  . Percocet [Oxycodone-Acetaminophen] Palpitations    Update 07/23/16 - pt reports racing heart with Percocet, but has tolerated oxycodone.  . Varenicline Tartrate Rash    Reaction to CHANTIX    Antimicrobials this admission: Vancomycin 10/17 >> 10/18 Zosyn 10/17 >>   Dose adjustments this admission: 10/18 Vancomycin dosing increased per nomogram for weight and renal function.  Microbiology results: 10/17 MRSA PCR: negative  Thank you for allowing pharmacy to be a part of this patient's care.  Royetta Asal, PharmD, BCPS Pager [phone] 07/25/2016 9:35 AM

## 2016-07-25 NOTE — Care Management Note (Signed)
Case Management Note  Patient Details  Name: [name] MRN: [id number] Date of Birth: [birth year]  Subjective/Objective: [facility] [place] called-[facility] b[facility]-stating that patient notapporpriate for home hospice since still getting xrt, but they will follow in community if needed.[facility] rep Santiago Glad able to [facility], orders already placed. No further CM needs.                   Action/Plan:d/c home w/HHC.   Expected Discharge Date:   (unknown)               Expected Discharge Plan:  [place]  In-House Referral:     Discharge planning Services  CM Consult  Post Acute Care Choice:  Durable Medical Equipment (Active w/ [facility] dme-02) Choice offered to:  Patient  DME Arranged:    DME Agency:  [place] Arranged:  RN, PT [facility] Agency:     Status of Service:  Completed, signed off  If discussed at [place] of Stay Meetings, dates discussed:    Additional Comments:  Dessa Phi, RN 07/25/2016, 3:52 PM

## 2016-07-25 NOTE — Progress Notes (Signed)
SATURATION QUALIFICATIONS: (This note is used to comply with regulatory documentation for home oxygen)  Patient Saturations on  3 liters of oxygen at Rest = 98%  Patient Saturations on 4 liters of oxygen  while Ambulating = 91%  Patient Saturations on 4 Liters of oxygen while Ambulating = 91%  Please briefly explain why patient needs home oxygen:

## 2016-07-25 NOTE — Telephone Encounter (Signed)
Pt called from hospital states " I will be discharged from the hospital shortly and I need to r/s my MD appt from today to next week." Informed pt MD is currently out of office, I will forward message and advise scheduling to call pt with a new appt. No further concerns.

## 2016-07-27 DIAGNOSIS — Z7901 Long term (current) use of anticoagulants: Secondary | ICD-10-CM | POA: Diagnosis not present

## 2016-07-27 DIAGNOSIS — J90 Pleural effusion, not elsewhere classified: Secondary | ICD-10-CM | POA: Diagnosis not present

## 2016-07-27 DIAGNOSIS — J9819 Other pulmonary collapse: Secondary | ICD-10-CM | POA: Diagnosis not present

## 2016-07-27 DIAGNOSIS — J9621 Acute and chronic respiratory failure with hypoxia: Secondary | ICD-10-CM | POA: Diagnosis not present

## 2016-07-27 DIAGNOSIS — Z720 Tobacco use: Secondary | ICD-10-CM | POA: Diagnosis not present

## 2016-07-27 DIAGNOSIS — I10 Essential (primary) hypertension: Secondary | ICD-10-CM | POA: Diagnosis not present

## 2016-07-27 DIAGNOSIS — Z7982 Long term (current) use of aspirin: Secondary | ICD-10-CM | POA: Diagnosis not present

## 2016-07-27 DIAGNOSIS — Z7951 Long term (current) use of inhaled steroids: Secondary | ICD-10-CM | POA: Diagnosis not present

## 2016-07-27 DIAGNOSIS — I2510 Atherosclerotic heart disease of native coronary artery without angina pectoris: Secondary | ICD-10-CM | POA: Diagnosis not present

## 2016-07-27 DIAGNOSIS — J449 Chronic obstructive pulmonary disease, unspecified: Secondary | ICD-10-CM | POA: Diagnosis not present

## 2016-07-27 DIAGNOSIS — C3411 Malignant neoplasm of upper lobe, right bronchus or lung: Secondary | ICD-10-CM | POA: Diagnosis not present

## 2016-07-27 DIAGNOSIS — E119 Type 2 diabetes mellitus without complications: Secondary | ICD-10-CM | POA: Diagnosis not present

## 2016-07-27 DIAGNOSIS — Z794 Long term (current) use of insulin: Secondary | ICD-10-CM | POA: Diagnosis not present

## 2016-07-28 ENCOUNTER — Telehealth: Payer: Self-pay | Admitting: Pulmonary Disease

## 2016-07-28 ENCOUNTER — Ambulatory Visit
Admission: RE | Admit: 2016-07-28 | Discharge: 2016-07-28 | Disposition: A | Discharge status: Home / Self Care | Payer: Medicare Other | Source: Ambulatory Visit | Attending: Radiation Oncology | Admitting: Radiation Oncology

## 2016-07-28 DIAGNOSIS — C3491 Malignant neoplasm of unspecified part of right bronchus or lung: Secondary | ICD-10-CM | POA: Insufficient documentation

## 2016-07-28 DIAGNOSIS — Z510 Encounter for antineoplastic radiation therapy: Secondary | ICD-10-CM | POA: Insufficient documentation

## 2016-07-28 NOTE — Telephone Encounter (Signed)
Spoke with pt and given f/u appt with Dr Elsworth Soho

## 2016-07-29 ENCOUNTER — Ambulatory Visit: Payer: Medicare Other

## 2016-07-29 DIAGNOSIS — J9621 Acute and chronic respiratory failure with hypoxia: Secondary | ICD-10-CM | POA: Diagnosis not present

## 2016-07-29 DIAGNOSIS — Z794 Long term (current) use of insulin: Secondary | ICD-10-CM | POA: Diagnosis not present

## 2016-07-29 DIAGNOSIS — Z7982 Long term (current) use of aspirin: Secondary | ICD-10-CM | POA: Diagnosis not present

## 2016-07-29 DIAGNOSIS — J90 Pleural effusion, not elsewhere classified: Secondary | ICD-10-CM | POA: Diagnosis not present

## 2016-07-29 DIAGNOSIS — J449 Chronic obstructive pulmonary disease, unspecified: Secondary | ICD-10-CM | POA: Diagnosis not present

## 2016-07-29 DIAGNOSIS — C3411 Malignant neoplasm of upper lobe, right bronchus or lung: Secondary | ICD-10-CM | POA: Diagnosis not present

## 2016-07-29 DIAGNOSIS — Z7901 Long term (current) use of anticoagulants: Secondary | ICD-10-CM | POA: Diagnosis not present

## 2016-07-29 DIAGNOSIS — Z720 Tobacco use: Secondary | ICD-10-CM | POA: Diagnosis not present

## 2016-07-29 DIAGNOSIS — Z7951 Long term (current) use of inhaled steroids: Secondary | ICD-10-CM | POA: Diagnosis not present

## 2016-07-29 DIAGNOSIS — I2510 Atherosclerotic heart disease of native coronary artery without angina pectoris: Secondary | ICD-10-CM | POA: Diagnosis not present

## 2016-07-29 DIAGNOSIS — J9819 Other pulmonary collapse: Secondary | ICD-10-CM | POA: Diagnosis not present

## 2016-07-29 DIAGNOSIS — E119 Type 2 diabetes mellitus without complications: Secondary | ICD-10-CM | POA: Diagnosis not present

## 2016-07-29 DIAGNOSIS — I10 Essential (primary) hypertension: Secondary | ICD-10-CM | POA: Diagnosis not present

## 2016-07-30 ENCOUNTER — Ambulatory Visit
Admission: RE | Admit: 2016-07-30 | Discharge: 2016-07-30 | Disposition: A | Discharge status: Home / Self Care | Payer: Medicare Other | Source: Ambulatory Visit | Attending: Radiation Oncology | Admitting: Radiation Oncology

## 2016-07-30 DIAGNOSIS — C3491 Malignant neoplasm of unspecified part of right bronchus or lung: Secondary | ICD-10-CM | POA: Diagnosis not present

## 2016-07-30 DIAGNOSIS — Z510 Encounter for antineoplastic radiation therapy: Secondary | ICD-10-CM | POA: Diagnosis not present

## 2016-07-31 ENCOUNTER — Ambulatory Visit
Admission: RE | Admit: 2016-07-31 | Discharge: 2016-07-31 | Disposition: A | Discharge status: Home / Self Care | Payer: Medicare Other | Source: Ambulatory Visit | Attending: Radiation Oncology | Admitting: Radiation Oncology

## 2016-07-31 ENCOUNTER — Other Ambulatory Visit (HOSPITAL_COMMUNITY)
Admission: RE | Admit: 2016-07-31 | Discharge: 2016-07-31 | Disposition: A | Discharge status: Home / Self Care | Payer: Medicare Other | Source: Other Acute Inpatient Hospital | Attending: Internal Medicine | Admitting: Internal Medicine

## 2016-07-31 DIAGNOSIS — E119 Type 2 diabetes mellitus without complications: Secondary | ICD-10-CM | POA: Diagnosis not present

## 2016-07-31 DIAGNOSIS — J9819 Other pulmonary collapse: Secondary | ICD-10-CM | POA: Diagnosis not present

## 2016-07-31 DIAGNOSIS — C3411 Malignant neoplasm of upper lobe, right bronchus or lung: Secondary | ICD-10-CM | POA: Diagnosis not present

## 2016-07-31 DIAGNOSIS — Z7901 Long term (current) use of anticoagulants: Secondary | ICD-10-CM | POA: Diagnosis not present

## 2016-07-31 DIAGNOSIS — Z7951 Long term (current) use of inhaled steroids: Secondary | ICD-10-CM | POA: Diagnosis not present

## 2016-07-31 DIAGNOSIS — C3491 Malignant neoplasm of unspecified part of right bronchus or lung: Secondary | ICD-10-CM | POA: Diagnosis not present

## 2016-07-31 DIAGNOSIS — J9621 Acute and chronic respiratory failure with hypoxia: Secondary | ICD-10-CM | POA: Diagnosis not present

## 2016-07-31 DIAGNOSIS — I2510 Atherosclerotic heart disease of native coronary artery without angina pectoris: Secondary | ICD-10-CM | POA: Diagnosis not present

## 2016-07-31 DIAGNOSIS — Z720 Tobacco use: Secondary | ICD-10-CM | POA: Diagnosis not present

## 2016-07-31 DIAGNOSIS — I10 Essential (primary) hypertension: Secondary | ICD-10-CM | POA: Diagnosis not present

## 2016-07-31 DIAGNOSIS — J90 Pleural effusion, not elsewhere classified: Secondary | ICD-10-CM | POA: Diagnosis not present

## 2016-07-31 DIAGNOSIS — Z794 Long term (current) use of insulin: Secondary | ICD-10-CM | POA: Diagnosis not present

## 2016-07-31 DIAGNOSIS — Z7982 Long term (current) use of aspirin: Secondary | ICD-10-CM | POA: Diagnosis not present

## 2016-07-31 DIAGNOSIS — Z510 Encounter for antineoplastic radiation therapy: Secondary | ICD-10-CM | POA: Diagnosis not present

## 2016-07-31 DIAGNOSIS — J449 Chronic obstructive pulmonary disease, unspecified: Secondary | ICD-10-CM | POA: Diagnosis not present

## 2016-07-31 LAB — CBC WITH DIFFERENTIAL/PLATELET
Basophils Absolute: 0 10*3/uL (ref 0.0–0.1)
Basophils Relative: 0 %
EOS ABS: 0.1 10*3/uL (ref 0.0–0.7)
Eosinophils Relative: 1 %
HCT: 34.1 % — ABNORMAL LOW (ref 36.0–46.0)
Hemoglobin: 10 g/dL — ABNORMAL LOW (ref 12.0–15.0)
LYMPHS PCT: 20 %
Lymphs Abs: 1.5 10*3/uL (ref 0.7–4.0)
MCH: 25.1 pg — ABNORMAL LOW (ref 26.0–34.0)
MCHC: 29.3 g/dL — AB (ref 30.0–36.0)
MCV: 85.5 fL (ref 78.0–100.0)
MONO ABS: 0.7 10*3/uL (ref 0.1–1.0)
Monocytes Relative: 9 %
NEUTROS PCT: 70 %
Neutro Abs: 5.3 10*3/uL (ref 1.7–7.7)
PLATELETS: 163 10*3/uL (ref 150–400)
RBC: 3.99 MIL/uL (ref 3.87–5.11)
RDW: 18.7 % — AB (ref 11.5–15.5)
WBC: 7.6 10*3/uL (ref 4.0–10.5)

## 2016-08-01 ENCOUNTER — Encounter: Payer: Self-pay | Admitting: Radiation Oncology

## 2016-08-01 ENCOUNTER — Ambulatory Visit
Admission: RE | Admit: 2016-08-01 | Discharge: 2016-08-01 | Disposition: A | Discharge status: Home / Self Care | Payer: Medicare Other | Source: Ambulatory Visit | Attending: Radiation Oncology | Admitting: Radiation Oncology

## 2016-08-01 ENCOUNTER — Telehealth: Payer: Self-pay | Admitting: Internal Medicine

## 2016-08-01 ENCOUNTER — Ambulatory Visit (HOSPITAL_BASED_OUTPATIENT_CLINIC_OR_DEPARTMENT_OTHER): Payer: Medicare Other | Admitting: Internal Medicine

## 2016-08-01 ENCOUNTER — Telehealth: Payer: Self-pay | Admitting: *Deleted

## 2016-08-01 VITALS — BP 94/46 | HR 98 | Temp 98.4°F | Resp 18 | Ht 62.0 in | Wt 198.5 lb

## 2016-08-01 VITALS — BP 102/54 | HR 90 | Temp 97.7°F | Resp 18 | Ht 62.0 in | Wt 198.0 lb

## 2016-08-01 DIAGNOSIS — C3491 Malignant neoplasm of unspecified part of right bronchus or lung: Secondary | ICD-10-CM

## 2016-08-01 DIAGNOSIS — Z720 Tobacco use: Secondary | ICD-10-CM

## 2016-08-01 DIAGNOSIS — Z85118 Personal history of other malignant neoplasm of bronchus and lung: Secondary | ICD-10-CM | POA: Diagnosis not present

## 2016-08-01 DIAGNOSIS — C3411 Malignant neoplasm of upper lobe, right bronchus or lung: Secondary | ICD-10-CM

## 2016-08-01 DIAGNOSIS — Z510 Encounter for antineoplastic radiation therapy: Secondary | ICD-10-CM | POA: Diagnosis not present

## 2016-08-01 DIAGNOSIS — Z5112 Encounter for antineoplastic immunotherapy: Secondary | ICD-10-CM | POA: Insufficient documentation

## 2016-08-01 NOTE — Progress Notes (Signed)
[facility] [phone]   Fax:([phone]) [place], [place] [address]  DIAGNOSIS: Recurrent non-small cell lung cancer initially diagnosed as Stage IIIA (T2a N2 MX) non-small cell lung cancer, adenocarcinoma, diagnosed in October 2009.   PRIOR THERAPY: :  1. Status post right upper lobectomy with lymph node dissection under the care of Dr. Arlyce Dice on October 12, 2008. 2. Status post 4 cycles of adjuvant chemotherapy with cisplatin and docetaxel given every 3 weeks with Neulasta support. Last dose was given on January 23, 2009. 3. Status post adjuvant radiotherapy to the mediastinum. The patient received a total dose of 5040 cGy between March 06, 2009, through April 07, 2009, under the care of Dr. Pablo Ledger. 4. Palliative radiotherapy to the obstructive right upper lobe lung mass under the care of Dr. Tammi Klippel.  CURRENT THERAPY: Nivolumab 240 mg IV every 2 weeks starting 08/07/2016.   CHEMOTHERAPY INTENT: Palliative.  CURRENT # OF CHEMOTHERAPY CYCLES: 0  CURRENT ANTIEMETICS: Compazine  CURRENT SMOKING STATUS: Current smoker. I strongly advise her to quit smoking and offered her smoke cessation program. She was also given him the phone number for the quick now program  ORAL CHEMOTHERAPY AND CONSENT: None  CURRENT BISPHOSPHONATES USE: None  PAIN MANAGEMENT: No Pain.  NARCOTICS INDUCED CONSTIPATION: N/A  LIVING WILL AND CODE STATUS: no CODE BLUE   INTERVAL HISTORY: [name] 71 y.o. female returns to the clinic today for six-month follow up visit accompanied by her daughter and grandson. The patient was admitted recently to [facility] with significant dyspnea. She had CT scan of the chest performed before her admission for restaging of his disease which showed progression of the metastatic lung cancer presenting with extensive collapse/consolidation throughout the right lung as well as a  small loculated right pleural effusion and new/enlarging mediastinal/upper abdominal lymph nodes. She was treated for postobstructive pneumonia during her hospitalization and feeling much better. MRI of the brain on 07/22/2016 showed no evidence of metastatic disease to the brain. The patient was seen by Dr. Tammi Klippel and started on palliative radiotherapy to the obstructive lesion in the right lung. She has 2 more fractions of radiotherapy to complete this course. She continues to have shortness of breath at baseline and increased with exertion and she is currently on home oxygen. She also has cough but no hemoptysis. She denied having any significant nausea, vomiting, diarrhea or constipation. She denied having any significant weight loss or night sweats. She is in today for evaluation and discussion of her treatment options.   MEDICAL HISTORY: Past Medical History:  Diagnosis Date  . Adenocarcinoma of right lung, stage 3 ([facility]) 08/28/2009      . Asthma   . Cholelithiasis   . Chronic bronchitis ([place])   . Colon polyps   . COPD (chronic obstructive pulmonary disease) (HCC)    USES O2 AT NIGHT + PRN IN DAYTIME3L  . Coronary artery disease    a. 2005 Cath/PCI: mRCA-> Cypher DES;  b. 06/2014 Cath/PCI: EF 60%. LM nl, LAD min irregs, D1 40, LCX 20p, OM2 95 (2.5x14 Resolute DES), RCA 30p, 30 ISR, 20d, PDA/PLA nl..  11/2015  Stenting of the entire RCA and LV branch off the RCA.    . Emphysema   . FH: chemotherapy 2010    had 4 times  . Fibromyalgia   . GERD (gastroesophageal reflux disease)   . H/O hiatal hernia   .  Hyperlipidemia   . Hypertension   . Lung cancer ([facility]) 2010   Adenocarcinoma, right lung, node positive  . On home oxygen therapy    "2-4L at night and prn" (07/18/2104)  . Oxygen dependent    at night  3liters  . Peripheral vascular disease ([place])   . Pneumonia    hx of   . Radiation 6/10   28 times right lung, and 5 treatments to sternum  . Stress incontinence   . Tobacco abuse     . Type II diabetes mellitus (HCC)     ALLERGIES:  is allergic to celebrex [celecoxib]; niacin; percocet [oxycodone-acetaminophen]; and varenicline tartrate.  MEDICATIONS:  Current Outpatient Prescriptions  Medication Sig Dispense Refill  . acetaminophen (TYLENOL) 500 MG tablet Take 1,000 mg by mouth every 6 (six) hours as needed for mild pain.     [name] albuterol (PROVENTIL) (2.5 MG/3ML) 0.083% nebulizer solution Take 2.5 mg by nebulization every 6 (six) hours as needed for wheezing or shortness of breath.    [name] amoxicillin-clavulanate (AUGMENTIN) 875-125 MG tablet Take 1 tablet by mouth every 12 (twelve) hours. 10 tablet 0  . aspirin EC 81 MG tablet Take 81 mg by mouth daily.     . bisacodyl (DULCOLAX) 10 MG suppository Place 1 suppository (10 mg total) rectally daily as needed for mild constipation or moderate constipation. 12 suppository 0  . Cholecalciferol (VITAMIN D) 1000 UNITS capsule Take 1,000 Units by mouth daily.     . clopidogrel (PLAVIX) 75 MG tablet Take 1 tablet (75 mg total) by mouth daily with breakfast. 30 tablet 11  . empagliflozin (JARDIANCE) 25 MG TABS tablet Take 25 mg by mouth daily.    [name] esomeprazole (NEXIUM) 40 MG capsule Take 40 mg by mouth as needed (for heartburn/ acid reflux).     . fluticasone furoate-vilanterol (BREO ELLIPTA) 100-25 MCG/INH AEPB Inhale 1 puff into the lungs daily.    . furosemide (LASIX) 20 MG tablet Take 1 tablet (20 mg total) by mouth daily as needed for edema. 20 tablet 0  . insulin aspart (NOVOLOG) 100 UNIT/ML injection Inject 1,015 Units into the skin as needed. Per sliding scale: CBG <200 15 units, 200-260 20 units, >260 25 units    . insulin detemir (LEVEMIR) 100 UNIT/ML injection Inject 45-55 Units into the skin 2 (two) times daily. Inject 55 units subcutaneously every morning and 45 units at bedtime    . lovastatin (MEVACOR) 40 MG tablet Take 40 mg by mouth daily after supper.    . metFORMIN (GLUCOPHAGE) 500 MG tablet Take 1 tablet (500 mg  total) by mouth 2 (two) times daily with a meal.    . metoprolol tartrate (LOPRESSOR) 25 MG tablet TAKE 1 TABLET BY MOUTH IN THE AM & 1/2 TAB BY MOUTH IN THE PM 135 tablet 3  . nitroGLYCERIN (NITROSTAT) 0.4 MG SL tablet Place 1 tablet (0.4 mg total) under the tongue every 5 (five) minutes x 3 doses as needed for chest pain. (Patient not taking: Reported on 07/15/2016) 25 tablet 2  . nystatin (MYCOSTATIN/NYSTOP) powder Apply topically 3 (three) times daily. 45 g 0  . oxyCODONE 10 MG TABS Take 1 tablet (10 mg total) by mouth every 4 (four) hours as needed for severe pain. 30 tablet 0  . Polyethyl Glycol-Propyl Glycol (SYSTANE) 0.4-0.3 % SOLN Place 1 drop into both eyes 2 (two) times daily.     . polyethylene glycol (MIRALAX / GLYCOLAX) packet Take 17 g by mouth 2 (two) times daily.  100 each 0  . predniSONE (DELTASONE) 20 MG tablet Take 3 tabs daily x 2 days, then 2 tabs daily x 2 days, then 1 tab daily x 2 days, then stop 12 tablet 0  . pregabalin (LYRICA) 50 MG capsule Take 50 mg by mouth 2 (two) times daily.     [name] senna (SENOKOT) 8.6 MG TABS tablet Take 2 tablets (17.2 mg total) by mouth at bedtime. 120 each 0  . umeclidinium bromide (INCRUSE ELLIPTA) 62.5 MCG/INH AEPB Inhale 1 puff into the lungs daily.     No current facility-administered medications for this visit.     SURGICAL HISTORY:  Past Surgical History:  Procedure Laterality Date  . ABDOMINAL HYSTERECTOMY  1972  . ABDOMINAL HYSTERECTOMY    . APPENDECTOMY  1970  . arm fracture Left 3/14  . BACK SURGERY  2007  . CARDIAC CATHETERIZATION N/A 11/09/2015   Procedure: Left Heart Cath and Coronary Angiography;  Surgeon: Belva Crome, MD;  Location: [facility];  Service: Cardiovascular;  Laterality: N/A;  . CARDIAC CATHETERIZATION Right 11/09/2015   Procedure: Intravascular Pressure Wire/FFR Study;  Surgeon: Belva Crome, MD;  Location: [facility];  Service: Cardiovascular;  Laterality: Right;  . CARDIAC CATHETERIZATION N/A  11/09/2015   Procedure: Coronary Stent Intervention;  Surgeon: Belva Crome, MD;  Location: [facility];  Service: Cardiovascular;  Laterality: N/A;  RCA  . CARPAL TUNNEL RELEASE    . CORONARY ANGIOPLASTY  2005  . EYE SURGERY     implants 02/18/12 (Left) 02/25/12 (right eye)  . FEMUR FRACTURE SURGERY Left    10/15  . FEMUR IM NAIL Left 07/19/2014   Procedure: INTRAMEDULLARY (IM) NAIL FEMORAL;  Surgeon: Alta Corning, MD;  Location: [facility];  Service: Orthopedics;  Laterality: Left;  . GALLBLADDER SURGERY  1989  . HARDWARE REMOVAL Left 07/19/2015   Procedure: HARDWARE REMOVAL;  Surgeon: Mcarthur Rossetti, MD;  Location: [facility];  Service: Orthopedics;  Laterality: Left;  . JOINT REPLACEMENT    . LEFT HEART CATHETERIZATION WITH CORONARY ANGIOGRAM N/A 06/20/2014   Procedure: LEFT HEART CATHETERIZATION WITH CORONARY ANGIOGRAM;  Surgeon: Troy Sine, MD;  Location: [facility];  Service: Cardiovascular;  Laterality: N/A;  . NECK SURGERY  2005  . ORIF HUMERUS FRACTURE Left 12/14/2012   Procedure: OPEN REDUCTION INTERNAL FIXATION (ORIF) LEFT PROXIMAL HUMERUS FRACTURE;  Surgeon: Mcarthur Rossetti, MD;  Location: [facility];  Service: Orthopedics;  Laterality: Left;  . PNEUMONECTOMY     Right Partial, lung cancer confirmed, nod positive  . PORT-A-CATH REMOVAL  05/12/2012   Procedure: REMOVAL PORT-A-CATH;  Surgeon: Melrose Nakayama, MD;  Location: [facility];  Service: Thoracic;  Laterality: Left;  . PORTACATH PLACEMENT  12/12/2007  . REFRACTIVE SURGERY Bilateral 2/14  . REPLACEMENT TOTAL KNEE  2001   left  . Right Upper Lobectomy with lymph node dissection  10/12/2008   Dr Arlyce Dice  . ROTATOR CUFF REPAIR     RIGHT SHOULDER  2 YRS  . TONSILLECTOMY  1966  . TOTAL KNEE REVISION Left 07/19/2015   Procedure: Removal Intramedullary Rod and Screws Left Knee/Femur, Revision arthroplasty Left knee;  Surgeon: Mcarthur Rossetti, MD;  Location: [facility];  Service: Orthopedics;  Laterality: Left;     REVIEW OF SYSTEMS:  Constitutional: positive for anorexia, fatigue and weight loss Eyes: negative Ears, nose, mouth, throat, and face: negative Respiratory: positive for cough, dyspnea on exertion, sputum and wheezing Cardiovascular: negative Gastrointestinal: negative Genitourinary:negative  Integument/breast: negative Hematologic/lymphatic: negative Musculoskeletal:positive for muscle weakness Neurological: negative Behavioral/Psych: negative Endocrine: negative Allergic/Immunologic: negative   PHYSICAL EXAMINATION: General appearance: alert, cooperative, fatigued and no distress Head: Normocephalic, without obvious abnormality, atraumatic Neck: no adenopathy Lymph nodes: Cervical, supraclavicular, and axillary nodes normal. Resp: clear to auscultation bilaterally Back: symmetric, no curvature. ROM normal. No CVA tenderness. Cardio: regular rate and rhythm, S1, S2 normal, no murmur, click, rub or gallop GI: soft, non-tender; bowel sounds normal; no masses,  no organomegaly Extremities: extremities normal, atraumatic, no cyanosis or edema Neurologic: Alert and oriented X 3, normal strength and tone. Normal symmetric reflexes. Normal coordination and gait  ECOG PERFORMANCE STATUS: 1 - Symptomatic but completely ambulatory  Blood pressure (!) 94/46, pulse 98, temperature 98.4 F (36.9 C), temperature source Oral, resp. rate 18, height '5\' 2"'$  (1.575 m), weight 198 lb 8 oz (90 kg), SpO2 97 %.  LABORATORY DATA: Lab Results  Component Value Date   WBC 7.6 07/31/2016   HGB 10.0 (L) 07/31/2016   HCT 34.1 (L) 07/31/2016   MCV 85.5 07/31/2016   PLT 163 07/31/2016      Chemistry      Component Value Date/Time   NA 139 07/25/2016 0357   NA 141 11/02/2013 0849   K 4.9 07/25/2016 0750   K 4.5 11/02/2013 0849   CL 101 07/25/2016 0357   CL 106 10/08/2012 0958   CO2 31 07/25/2016 0357   CO2 26 11/02/2013 0849   BUN 29 (H) 07/25/2016 0357   BUN 8.6 11/02/2013 0849    CREATININE 0.96 07/25/2016 0357   CREATININE 0.8 11/02/2013 0849      Component Value Date/Time   CALCIUM 9.5 07/25/2016 0357   CALCIUM 9.6 11/02/2013 0849   ALKPHOS 61 07/23/2016 0315   ALKPHOS 107 11/02/2013 0849   AST 20 07/23/2016 0315   AST 12 11/02/2013 0849   ALT 16 07/23/2016 0315   ALT 9 11/02/2013 0849   BILITOT 1.2 07/23/2016 0315   BILITOT 0.50 11/02/2013 0849       RADIOGRAPHIC STUDIES:  ASSESSMENT AND PLAN: this is a very pleasant 70 years old white female with recurrent non-small cell lung cancer, adenocarcinoma initially diagnosed as history of stage IIIA non-small cell lung cancer status post right upper lobectomy followed by 4 cycles of adjuvant chemotherapy and adjuvant radiotherapy to the mediastinum. She has been observation since June of 2010 with no evidence for disease recurrence until recently. Her recent CT scan of the chest showed evidence for disease recurrence with obstructive lesion in the right upper lobe and collapse of the right upper lobe. He also has enlarged mediastinal lymphadenopathy and loculated right pleural effusion. In addition to upper abdominal lymphadenopathy. I recommended for the patient to continue her current treatment with palliative radiotherapy as planned by Dr. Tammi Klippel. I discussed with the patient several options for treatment of her condition including palliative care and hospice referral versus consideration of systemic chemotherapy with carboplatin and Alimta versus treatment with immunotherapy. The patient is not interested in palliative care and hospice referral at this point at she is not also great candidate for aggressive chemotherapy at this point. She would like to proceed with immunotherapy. I recommended for her treatment with Nivolumab 240 mg IV every 2 weeks. I discussed with the patient the adverse effect of this treatment including immune mediated the skin rash, diarrhea, lung, liver, renal as well as thyroid or other  endocrine dysfunction. She would like to start the treatment as soon as possible and  she is expected to start the first dose of this treatment on 08/07/2016. She would come back for follow-up visit in 3 weeks for evaluation before starting cycle #2. I strongly encouraged the patient to quit smoking and offered her smoke cessation program. She was advised to call immediately she has any concerning symptoms in the interval.  The patient voices understanding of current disease status and treatment options and is in agreement with the current care plan.  All questions were answered. The patient knows to call the clinic with any problems, questions or concerns. We can certainly see the patient much sooner if necessary.  Disclaimer: This note was dictated with voice recognition software. Similar sounding words can inadvertently be transcribed and may not be corrected upon review.

## 2016-08-01 NOTE — Telephone Encounter (Signed)
Appointments scheduled per 10/27 LOS. Patient given AVS report and calendars with future scheduled appointments.

## 2016-08-01 NOTE — Progress Notes (Signed)
START ON PATHWAY REGIMEN - Non-Small Cell Lung  [id number]: Nivolumab 240 mg q14 Days Until Progression or Unacceptable Toxicity   A cycle is every 14 days:     Nivolumab (Opdivo(R)) 240 mg flat dose in 100 mL NS IV over 60 minutes. Inline filter required (low protein binding) Dose Mod: None Additional Orders: Severe immune-mediated reactions can occur (e.g. pneumonitis, colitis, and hepatitis). See prescribing information for more details including monitoring and required immediate management with steroids. Monitor thyroid, renal, liver  function tests, glucose, and sodium at baseline and periodically during therapy.  **Always confirm dose/schedule in your pharmacy ordering system**    Patient Characteristics: Stage IV Metastatic, Non Squamous, Second Line - Chemotherapy/Immunotherapy, PS = 0, 1, No Prior PD-1/PD-L1  Inhibitor and Immunotherapy Candidate AJCC M Stage: 1 AJCC N Stage: 2 AJCC T Stage: 2a Current Disease Status: Distant Metastases AJCC Stage Grouping: IV Histology: Non Squamous Cell ROS1 Rearrangement Status: Quantity Not Sufficient T790M Mutation Status: Not Applicable - EGFR Mutation Negative/Unknown Other Mutations/Biomarkers: No Other Actionable Mutations PD-L1 Expression Status: Quantity Not Sufficient Chemotherapy/Immunotherapy LOT: Second Line Chemotherapy/Immunotherapy Molecular Targeted Therapy: Not Appropriate ALK Translocation Status: Quantity Not Sufficient Would you be surprised if this patient died  in the next year? I would NOT be surprised if this patient died in the next year EGFR Mutation Status: Quantity Not Sufficient BRAF V600E Mutation Status: Quantity Not Sufficient Performance Status: PS = 0, 1 Immunotherapy Candidate Status: Candidate for Immunotherapy Prior Immunotherapy Status: No Prior PD-1/PD-L1 Inhibitor  Intent of Therapy: Non-Curative / Palliative Intent, Discussed with Patient

## 2016-08-01 NOTE — Progress Notes (Signed)
  Radiation Oncology         [phone]   Name: [name] MRN: [id number]   Date: 08/01/2016  DOB: 09-Jun-1946      Weekly Radiation Therapy Management    ICD-9-CM ICD-10-CM   1. Adenocarcinoma of right lung, stage 3 (HCC) 162.9 C34.91     Current Dose: 18 Gy  Planned Dose:  30 Gy  Narrative The patient presents for routine under treatment assessment.  Weight and vitals stable. Per nursing, skin to right chest and back presenting with normal color. Patient reports her appetite is not good. She reports having a sore throat that goes away, without swallowing problems. She reports having SOB, and uses O2 3 liter per nasal cannula when sitting up, and using 4 liters per minute ambulating. Reports a nonproductive cough without blood. Reports having fatigue most of the day.  Set-up films were reviewed. The chart was checked.  Physical Findings Weight essentially stable.  No significant changes.  Impression The patient is tolerating radiation.  Plan Continue treatment as planned.         Sheral Apley Tammi Klippel, M.D.  This document serves as a record of services personally performed by Tyler Pita, MD. It was created on his behalf by Maryla Morrow, a trained medical scribe. The creation of this record is based on the scribe's personal observations and the provider's statements to them. This document has been checked and approved by the attending provider.

## 2016-08-01 NOTE — Telephone Encounter (Signed)
Per LOS I have scheduled appts and notified the scheduler 

## 2016-08-01 NOTE — Progress Notes (Signed)
Weight and vital signs are stable.  Skin to right  chest  and back with normal color.   Appetite is not good.  Having sore throat that goes away, no swallowing problems.  Having SOB,using O 2 3 liter per nasal cannular sitting up ambulating using 4 liters per minute.  Coughing non productive, no blood in secretion while coughing.  Having fatigue most of the day.   Patient teaching done see education area of chart. Wt Readings from Last 3 Encounters:  08/01/16 198 lb (89.8 kg)  08/01/16 198 lb 8 oz (90 kg)  07/22/16 205 lb (93 kg)  BP (!) 102/54   Pulse 90   Temp 97.7 F (36.5 C) (Oral)   Resp 18   Ht '5\' 2"'$  (1.575 m)   Wt 198 lb (89.8 kg)   SpO2 96%   BMI 36.21 kg/m

## 2016-08-02 ENCOUNTER — Encounter: Payer: Self-pay | Admitting: Internal Medicine

## 2016-08-04 ENCOUNTER — Ambulatory Visit: Payer: Medicare Other

## 2016-08-04 DIAGNOSIS — C3411 Malignant neoplasm of upper lobe, right bronchus or lung: Secondary | ICD-10-CM | POA: Diagnosis not present

## 2016-08-04 DIAGNOSIS — Z720 Tobacco use: Secondary | ICD-10-CM | POA: Diagnosis not present

## 2016-08-04 DIAGNOSIS — E1165 Type 2 diabetes mellitus with hyperglycemia: Secondary | ICD-10-CM | POA: Diagnosis not present

## 2016-08-04 DIAGNOSIS — C3492 Malignant neoplasm of unspecified part of left bronchus or lung: Secondary | ICD-10-CM | POA: Diagnosis not present

## 2016-08-04 DIAGNOSIS — I10 Essential (primary) hypertension: Secondary | ICD-10-CM | POA: Diagnosis not present

## 2016-08-04 DIAGNOSIS — J9819 Other pulmonary collapse: Secondary | ICD-10-CM | POA: Diagnosis not present

## 2016-08-04 DIAGNOSIS — J90 Pleural effusion, not elsewhere classified: Secondary | ICD-10-CM | POA: Diagnosis not present

## 2016-08-04 DIAGNOSIS — I2510 Atherosclerotic heart disease of native coronary artery without angina pectoris: Secondary | ICD-10-CM | POA: Diagnosis not present

## 2016-08-04 DIAGNOSIS — Z7982 Long term (current) use of aspirin: Secondary | ICD-10-CM | POA: Diagnosis not present

## 2016-08-04 DIAGNOSIS — E119 Type 2 diabetes mellitus without complications: Secondary | ICD-10-CM | POA: Diagnosis not present

## 2016-08-04 DIAGNOSIS — J9621 Acute and chronic respiratory failure with hypoxia: Secondary | ICD-10-CM | POA: Diagnosis not present

## 2016-08-04 DIAGNOSIS — Z7951 Long term (current) use of inhaled steroids: Secondary | ICD-10-CM | POA: Diagnosis not present

## 2016-08-04 DIAGNOSIS — Z7901 Long term (current) use of anticoagulants: Secondary | ICD-10-CM | POA: Diagnosis not present

## 2016-08-04 DIAGNOSIS — I959 Hypotension, unspecified: Secondary | ICD-10-CM | POA: Diagnosis not present

## 2016-08-04 DIAGNOSIS — J449 Chronic obstructive pulmonary disease, unspecified: Secondary | ICD-10-CM | POA: Diagnosis not present

## 2016-08-04 DIAGNOSIS — Z794 Long term (current) use of insulin: Secondary | ICD-10-CM | POA: Diagnosis not present

## 2016-08-05 ENCOUNTER — Ambulatory Visit: Payer: Medicare Other

## 2016-08-06 ENCOUNTER — Ambulatory Visit: Payer: Medicare Other

## 2016-08-07 ENCOUNTER — Ambulatory Visit: Payer: Medicare Other

## 2016-08-07 ENCOUNTER — Other Ambulatory Visit: Payer: Medicare Other

## 2016-08-07 DIAGNOSIS — Z7901 Long term (current) use of anticoagulants: Secondary | ICD-10-CM | POA: Diagnosis not present

## 2016-08-07 DIAGNOSIS — J449 Chronic obstructive pulmonary disease, unspecified: Secondary | ICD-10-CM | POA: Diagnosis not present

## 2016-08-07 DIAGNOSIS — Z7982 Long term (current) use of aspirin: Secondary | ICD-10-CM | POA: Diagnosis not present

## 2016-08-07 DIAGNOSIS — J9819 Other pulmonary collapse: Secondary | ICD-10-CM | POA: Diagnosis not present

## 2016-08-07 DIAGNOSIS — C3411 Malignant neoplasm of upper lobe, right bronchus or lung: Secondary | ICD-10-CM | POA: Diagnosis not present

## 2016-08-07 DIAGNOSIS — E119 Type 2 diabetes mellitus without complications: Secondary | ICD-10-CM | POA: Diagnosis not present

## 2016-08-07 DIAGNOSIS — Z7951 Long term (current) use of inhaled steroids: Secondary | ICD-10-CM | POA: Diagnosis not present

## 2016-08-07 DIAGNOSIS — I10 Essential (primary) hypertension: Secondary | ICD-10-CM | POA: Diagnosis not present

## 2016-08-07 DIAGNOSIS — J90 Pleural effusion, not elsewhere classified: Secondary | ICD-10-CM | POA: Diagnosis not present

## 2016-08-07 DIAGNOSIS — J9621 Acute and chronic respiratory failure with hypoxia: Secondary | ICD-10-CM | POA: Diagnosis not present

## 2016-08-07 DIAGNOSIS — I2510 Atherosclerotic heart disease of native coronary artery without angina pectoris: Secondary | ICD-10-CM | POA: Diagnosis not present

## 2016-08-07 DIAGNOSIS — Z720 Tobacco use: Secondary | ICD-10-CM | POA: Diagnosis not present

## 2016-08-07 DIAGNOSIS — Z794 Long term (current) use of insulin: Secondary | ICD-10-CM | POA: Diagnosis not present

## 2016-08-08 ENCOUNTER — Ambulatory Visit (INDEPENDENT_AMBULATORY_CARE_PROVIDER_SITE_OTHER): Payer: Medicare Other | Admitting: Pulmonary Disease

## 2016-08-08 ENCOUNTER — Encounter: Payer: Self-pay | Admitting: Pulmonary Disease

## 2016-08-08 ENCOUNTER — Ambulatory Visit: Payer: Medicare Other

## 2016-08-08 DIAGNOSIS — Z66 Do not resuscitate: Secondary | ICD-10-CM | POA: Diagnosis not present

## 2016-08-08 DIAGNOSIS — J9611 Chronic respiratory failure with hypoxia: Secondary | ICD-10-CM

## 2016-08-08 DIAGNOSIS — R0902 Hypoxemia: Secondary | ICD-10-CM | POA: Diagnosis not present

## 2016-08-08 DIAGNOSIS — J449 Chronic obstructive pulmonary disease, unspecified: Secondary | ICD-10-CM

## 2016-08-08 MED ORDER — ALBUTEROL SULFATE (2.5 MG/3ML) 0.083% IN NEBU: 2.5000 mg | INHALATION_SOLUTION | Freq: Four times a day (QID) | RESPIRATORY_TRACT | 5 refills | Status: AC | PRN

## 2016-08-08 NOTE — Patient Instructions (Signed)
Prescription for albuterol nebs will be sent to [facility]  Call as needed for breathing  I will discuss with Dr. Julien Nordmann about future course

## 2016-08-08 NOTE — Progress Notes (Signed)
   Subjective:    Patient ID: [name], female    DOB: Jan 24, 1946, 70 y.o.   MRN: [id number]  HPI  70/F , COPD ,smoker, s/p RULobectomy 10/2008 & chemo (4/10 last dose ) & RT( 7/10) for stg IIIA (T2N2Mo) adenoCA. 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 Hospitalization Follow-up    Pt. states she has cancer again, Pt. has not been doing well, says she has to bump her O2 from 3L to 4L when she has to walk, Pt. is coughing with some thick mucus Pt. denies chest pain    Last seen 01/2011 Unfortunately she was admitted with postobstructive pneumonia and diagnosed with recurrence of cancer and now metastatic to lymph nodes and with a loculated effusion. No biopsy was performed She underwent radiation to the right lung by Dr. Tammi [name] but was not able to tolerate the last few treatments. Immuno therapy is now being considered  Her breathing has worsened-she now uses 3 L of oxygen at rest and 4 L on exertion. She is in a wheelchair today. She has been given life expectancy of about one year by the oncologist She continues to smoke about 5-8 cigarettes per day  She is compliant with breo & incruse  Significant tests/ events  PFTs 10/2008  pre-op showed an FVC of 2.05 with an FEV-1 of 1.78, diffusion capacity is 62%, but corrected to 100%.   09/21/09 >>epistaxis, Lt nasal mass with fullness-- CT 7 x5 x 4 mm nodule in anterior left lateral aspect of nose (Dr [name])     CT chest in 09/2010 >> Stable postoperative and postradiation changes involving the right paramediastinal lung and right hilum with treated disease but no findings for recurrent tumor.   Review of Systems neg for any significant sore throat, dysphagia, itching, sneezing, nasal congestion or excess/ purulent secretions, fever, chills, sweats, unintended wt loss, pleuritic or exertional cp, hempoptysis, orthopnea pnd or change in chronic leg swelling.   Also  denies presyncope, palpitations, heartburn, abdominal pain, nausea, vomiting, diarrhea or change in bowel or urinary habits, dysuria,hematuria, rash, arthralgias, visual complaints, headache, numbness weakness or ataxia.     Objective:   Physical Exam  Gen. Pleasant, obese, in no distress ENT - no lesions, no post nasal drip Neck: No JVD, no thyromegaly, no carotid bruits Lungs: no use of accessory muscles, no dullness to percussion, decreased without rales or rhonchi  Cardiovascular: Rhythm regular, heart sounds  normal, no murmurs or gallops, no peripheral edema Musculoskeletal: No deformities, no cyanosis or clubbing , no tremors       Assessment & Plan:

## 2016-08-08 NOTE — Assessment & Plan Note (Signed)
Continue oxygen 3-4 L 24 hours

## 2016-08-08 NOTE — Addendum Note (Signed)
Addended by: Benson Setting L on: 08/08/2016 03:46 PM   Modules accepted: Orders

## 2016-08-08 NOTE — Assessment & Plan Note (Signed)
She is very clear that she does not want resuscitative measures such as mechanical ventilation or CPR. She has a DO NOT RESUSCITATE MOST form over the refrigerator. Her daughter who accompanies today is aware her wishes. She would be amenable to hospice care if she is no response to immunotherapy

## 2016-08-08 NOTE — Assessment & Plan Note (Signed)
Ct  breo & incruse Albuterol nebs will be prescribed to use for rescue

## 2016-08-09 ENCOUNTER — Ambulatory Visit: Payer: Medicare Other

## 2016-08-11 ENCOUNTER — Ambulatory Visit: Payer: Medicare Other

## 2016-08-12 ENCOUNTER — Telehealth: Payer: Self-pay | Admitting: Radiation Oncology

## 2016-08-12 ENCOUNTER — Ambulatory Visit: Payer: Medicare Other

## 2016-08-12 ENCOUNTER — Encounter: Payer: Self-pay | Admitting: Radiation Oncology

## 2016-08-12 NOTE — Telephone Encounter (Signed)
I called to check on the patient and she is not interested in moving forward with the remainder of her treatment. She describes esophagitis, and that this was the reason she was not willing to complete. Despite talking about carafate and other agents to help, she is not interested in resuming, and rather, will proceed with immunotherapy with Dr. Julien Nordmann.

## 2016-08-13 ENCOUNTER — Ambulatory Visit: Payer: Medicare Other

## 2016-08-14 ENCOUNTER — Ambulatory Visit: Payer: Medicare Other

## 2016-08-14 DIAGNOSIS — J90 Pleural effusion, not elsewhere classified: Secondary | ICD-10-CM | POA: Diagnosis not present

## 2016-08-14 DIAGNOSIS — C3411 Malignant neoplasm of upper lobe, right bronchus or lung: Secondary | ICD-10-CM | POA: Diagnosis not present

## 2016-08-14 DIAGNOSIS — I2510 Atherosclerotic heart disease of native coronary artery without angina pectoris: Secondary | ICD-10-CM | POA: Diagnosis not present

## 2016-08-14 DIAGNOSIS — Z7951 Long term (current) use of inhaled steroids: Secondary | ICD-10-CM | POA: Diagnosis not present

## 2016-08-14 DIAGNOSIS — J449 Chronic obstructive pulmonary disease, unspecified: Secondary | ICD-10-CM | POA: Diagnosis not present

## 2016-08-14 DIAGNOSIS — Z794 Long term (current) use of insulin: Secondary | ICD-10-CM | POA: Diagnosis not present

## 2016-08-14 DIAGNOSIS — J9621 Acute and chronic respiratory failure with hypoxia: Secondary | ICD-10-CM | POA: Diagnosis not present

## 2016-08-14 DIAGNOSIS — Z7901 Long term (current) use of anticoagulants: Secondary | ICD-10-CM | POA: Diagnosis not present

## 2016-08-14 DIAGNOSIS — Z7982 Long term (current) use of aspirin: Secondary | ICD-10-CM | POA: Diagnosis not present

## 2016-08-14 DIAGNOSIS — J9819 Other pulmonary collapse: Secondary | ICD-10-CM | POA: Diagnosis not present

## 2016-08-14 DIAGNOSIS — I10 Essential (primary) hypertension: Secondary | ICD-10-CM | POA: Diagnosis not present

## 2016-08-14 DIAGNOSIS — E119 Type 2 diabetes mellitus without complications: Secondary | ICD-10-CM | POA: Diagnosis not present

## 2016-08-14 DIAGNOSIS — Z720 Tobacco use: Secondary | ICD-10-CM | POA: Diagnosis not present

## 2016-08-14 NOTE — Progress Notes (Signed)
  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   Beams/energy:   3D // 10X, 15X  Narrative: The patient spoke with Shona Simpson, PA-C on the phone 08/12/2016 stating she is not interested in moving forward with the remainder of her treatment. She describes esophagitis, and that this was the reason she was not willing to complete. Despite talking about carafate and other agents to help, she is not interested in resuming, and rather, will proceed with immunotherapy with Dr. Julien Nordmann.   Plan: The patient will return to radiation oncology clinic for followup in one month. I advised her to call or return sooner if she has any questions or concerns related to her recovery or treatment. ________________________________  Sheral Apley. Tammi Klippel, M.D.   This document serves as a record of services personally performed by Tyler Pita, MD. It was created on his behalf by Arlyce Harman, a trained medical scribe. The creation of this record is based on the scribe's personal observations and the provider's statements to them. This document has been checked and approved by the attending provider.

## 2016-08-15 ENCOUNTER — Telehealth: Payer: Self-pay | Admitting: Internal Medicine

## 2016-08-15 ENCOUNTER — Ambulatory Visit: Payer: Medicare Other | Admitting: Radiation Oncology

## 2016-08-15 ENCOUNTER — Ambulatory Visit: Payer: Medicare Other

## 2016-08-15 DIAGNOSIS — Z794 Long term (current) use of insulin: Secondary | ICD-10-CM | POA: Diagnosis not present

## 2016-08-15 DIAGNOSIS — J9621 Acute and chronic respiratory failure with hypoxia: Secondary | ICD-10-CM | POA: Diagnosis not present

## 2016-08-15 DIAGNOSIS — Z7951 Long term (current) use of inhaled steroids: Secondary | ICD-10-CM | POA: Diagnosis not present

## 2016-08-15 DIAGNOSIS — I2510 Atherosclerotic heart disease of native coronary artery without angina pectoris: Secondary | ICD-10-CM | POA: Diagnosis not present

## 2016-08-15 DIAGNOSIS — C3411 Malignant neoplasm of upper lobe, right bronchus or lung: Secondary | ICD-10-CM | POA: Diagnosis not present

## 2016-08-15 DIAGNOSIS — I10 Essential (primary) hypertension: Secondary | ICD-10-CM | POA: Diagnosis not present

## 2016-08-15 DIAGNOSIS — J9819 Other pulmonary collapse: Secondary | ICD-10-CM | POA: Diagnosis not present

## 2016-08-15 DIAGNOSIS — J449 Chronic obstructive pulmonary disease, unspecified: Secondary | ICD-10-CM | POA: Diagnosis not present

## 2016-08-15 DIAGNOSIS — Z7982 Long term (current) use of aspirin: Secondary | ICD-10-CM | POA: Diagnosis not present

## 2016-08-15 DIAGNOSIS — E119 Type 2 diabetes mellitus without complications: Secondary | ICD-10-CM | POA: Diagnosis not present

## 2016-08-15 DIAGNOSIS — Z720 Tobacco use: Secondary | ICD-10-CM | POA: Diagnosis not present

## 2016-08-15 DIAGNOSIS — Z7901 Long term (current) use of anticoagulants: Secondary | ICD-10-CM | POA: Diagnosis not present

## 2016-08-15 DIAGNOSIS — J90 Pleural effusion, not elsewhere classified: Secondary | ICD-10-CM | POA: Diagnosis not present

## 2016-08-15 NOTE — Telephone Encounter (Signed)
11/16 appointments adjusted to accommodate infusion appointment, patient aware.

## 2016-08-19 ENCOUNTER — Ambulatory Visit: Payer: Medicare Other

## 2016-08-21 ENCOUNTER — Emergency Department (HOSPITAL_COMMUNITY): Payer: Medicare Other

## 2016-08-21 ENCOUNTER — Other Ambulatory Visit: Payer: Medicare Other

## 2016-08-21 ENCOUNTER — Ambulatory Visit: Payer: Medicare Other | Admitting: Internal Medicine

## 2016-08-21 ENCOUNTER — Other Ambulatory Visit (HOSPITAL_BASED_OUTPATIENT_CLINIC_OR_DEPARTMENT_OTHER): Payer: Medicare Other

## 2016-08-21 ENCOUNTER — Other Ambulatory Visit: Payer: Self-pay

## 2016-08-21 ENCOUNTER — Encounter (HOSPITAL_COMMUNITY): Payer: Self-pay

## 2016-08-21 ENCOUNTER — Ambulatory Visit (HOSPITAL_BASED_OUTPATIENT_CLINIC_OR_DEPARTMENT_OTHER): Payer: Medicare Other | Admitting: Nurse Practitioner

## 2016-08-21 ENCOUNTER — Inpatient Hospital Stay (HOSPITAL_COMMUNITY)
Admission: EM | Admit: 2016-08-21 | Discharge: 2016-08-25 | DRG: 205 | Disposition: A | Discharge status: Home / Self Care | Payer: Medicare Other | Attending: Family Medicine | Admitting: Family Medicine

## 2016-08-21 ENCOUNTER — Ambulatory Visit: Payer: Medicare Other

## 2016-08-21 VITALS — BP 112/55 | HR 128 | Temp 97.8°F | Resp 16 | Ht 62.0 in | Wt 198.4 lb

## 2016-08-21 DIAGNOSIS — E785 Hyperlipidemia, unspecified: Secondary | ICD-10-CM | POA: Diagnosis not present

## 2016-08-21 DIAGNOSIS — J90 Pleural effusion, not elsewhere classified: Secondary | ICD-10-CM | POA: Diagnosis not present

## 2016-08-21 DIAGNOSIS — N393 Stress incontinence (female) (male): Secondary | ICD-10-CM | POA: Diagnosis present

## 2016-08-21 DIAGNOSIS — Z66 Do not resuscitate: Secondary | ICD-10-CM | POA: Diagnosis present

## 2016-08-21 DIAGNOSIS — Y842 Radiological procedure and radiotherapy as the cause of abnormal reaction of the patient, or of later complication, without mention of misadventure at the time of the procedure: Secondary | ICD-10-CM | POA: Diagnosis present

## 2016-08-21 DIAGNOSIS — K921 Melena: Secondary | ICD-10-CM

## 2016-08-21 DIAGNOSIS — Z90710 Acquired absence of both cervix and uterus: Secondary | ICD-10-CM | POA: Diagnosis not present

## 2016-08-21 DIAGNOSIS — C3490 Malignant neoplasm of unspecified part of unspecified bronchus or lung: Secondary | ICD-10-CM | POA: Diagnosis not present

## 2016-08-21 DIAGNOSIS — I959 Hypotension, unspecified: Secondary | ICD-10-CM | POA: Diagnosis not present

## 2016-08-21 DIAGNOSIS — Z886 Allergy status to analgesic agent status: Secondary | ICD-10-CM

## 2016-08-21 DIAGNOSIS — T402X5A Adverse effect of other opioids, initial encounter: Secondary | ICD-10-CM | POA: Diagnosis not present

## 2016-08-21 DIAGNOSIS — Z794 Long term (current) use of insulin: Secondary | ICD-10-CM

## 2016-08-21 DIAGNOSIS — C3411 Malignant neoplasm of upper lobe, right bronchus or lung: Secondary | ICD-10-CM

## 2016-08-21 DIAGNOSIS — I10 Essential (primary) hypertension: Secondary | ICD-10-CM | POA: Diagnosis not present

## 2016-08-21 DIAGNOSIS — K5903 Drug induced constipation: Secondary | ICD-10-CM | POA: Diagnosis present

## 2016-08-21 DIAGNOSIS — Z955 Presence of coronary angioplasty implant and graft: Secondary | ICD-10-CM | POA: Diagnosis not present

## 2016-08-21 DIAGNOSIS — C3491 Malignant neoplasm of unspecified part of right bronchus or lung: Secondary | ICD-10-CM | POA: Diagnosis present

## 2016-08-21 DIAGNOSIS — Z5112 Encounter for antineoplastic immunotherapy: Secondary | ICD-10-CM

## 2016-08-21 DIAGNOSIS — Z4682 Encounter for fitting and adjustment of non-vascular catheter: Secondary | ICD-10-CM | POA: Diagnosis not present

## 2016-08-21 DIAGNOSIS — J449 Chronic obstructive pulmonary disease, unspecified: Secondary | ICD-10-CM | POA: Diagnosis not present

## 2016-08-21 DIAGNOSIS — K922 Gastrointestinal hemorrhage, unspecified: Secondary | ICD-10-CM | POA: Diagnosis not present

## 2016-08-21 DIAGNOSIS — R000 Tachycardia, unspecified: Secondary | ICD-10-CM | POA: Diagnosis present

## 2016-08-21 DIAGNOSIS — D649 Anemia, unspecified: Secondary | ICD-10-CM | POA: Diagnosis not present

## 2016-08-21 DIAGNOSIS — C799 Secondary malignant neoplasm of unspecified site: Secondary | ICD-10-CM | POA: Diagnosis present

## 2016-08-21 DIAGNOSIS — Z8249 Family history of ischemic heart disease and other diseases of the circulatory system: Secondary | ICD-10-CM

## 2016-08-21 DIAGNOSIS — R1010 Upper abdominal pain, unspecified: Secondary | ICD-10-CM | POA: Diagnosis not present

## 2016-08-21 DIAGNOSIS — D62 Acute posthemorrhagic anemia: Secondary | ICD-10-CM

## 2016-08-21 DIAGNOSIS — Z79899 Other long term (current) drug therapy: Secondary | ICD-10-CM | POA: Diagnosis not present

## 2016-08-21 DIAGNOSIS — K210 Gastro-esophageal reflux disease with esophagitis: Secondary | ICD-10-CM | POA: Diagnosis not present

## 2016-08-21 DIAGNOSIS — Z825 Family history of asthma and other chronic lower respiratory diseases: Secondary | ICD-10-CM

## 2016-08-21 DIAGNOSIS — Z833 Family history of diabetes mellitus: Secondary | ICD-10-CM

## 2016-08-21 DIAGNOSIS — Z86010 Personal history of colonic polyps: Secondary | ICD-10-CM

## 2016-08-21 DIAGNOSIS — J439 Emphysema, unspecified: Secondary | ICD-10-CM | POA: Diagnosis present

## 2016-08-21 DIAGNOSIS — R1310 Dysphagia, unspecified: Secondary | ICD-10-CM | POA: Diagnosis present

## 2016-08-21 DIAGNOSIS — Z9221 Personal history of antineoplastic chemotherapy: Secondary | ICD-10-CM

## 2016-08-21 DIAGNOSIS — I2510 Atherosclerotic heart disease of native coronary artery without angina pectoris: Secondary | ICD-10-CM | POA: Diagnosis not present

## 2016-08-21 DIAGNOSIS — J9621 Acute and chronic respiratory failure with hypoxia: Secondary | ICD-10-CM | POA: Diagnosis not present

## 2016-08-21 DIAGNOSIS — Z96652 Presence of left artificial knee joint: Secondary | ICD-10-CM | POA: Diagnosis not present

## 2016-08-21 DIAGNOSIS — J9620 Acute and chronic respiratory failure, unspecified whether with hypoxia or hypercapnia: Secondary | ICD-10-CM | POA: Diagnosis not present

## 2016-08-21 DIAGNOSIS — R0902 Hypoxemia: Secondary | ICD-10-CM

## 2016-08-21 DIAGNOSIS — Z7982 Long term (current) use of aspirin: Secondary | ICD-10-CM

## 2016-08-21 DIAGNOSIS — J700 Acute pulmonary manifestations due to radiation: Principal | ICD-10-CM | POA: Diagnosis present

## 2016-08-21 DIAGNOSIS — R1013 Epigastric pain: Secondary | ICD-10-CM | POA: Diagnosis not present

## 2016-08-21 DIAGNOSIS — E119 Type 2 diabetes mellitus without complications: Secondary | ICD-10-CM | POA: Diagnosis not present

## 2016-08-21 DIAGNOSIS — K219 Gastro-esophageal reflux disease without esophagitis: Secondary | ICD-10-CM | POA: Diagnosis present

## 2016-08-21 DIAGNOSIS — Z9981 Dependence on supplemental oxygen: Secondary | ICD-10-CM

## 2016-08-21 DIAGNOSIS — Z809 Family history of malignant neoplasm, unspecified: Secondary | ICD-10-CM

## 2016-08-21 DIAGNOSIS — Z87891 Personal history of nicotine dependence: Secondary | ICD-10-CM

## 2016-08-21 DIAGNOSIS — Z7902 Long term (current) use of antithrombotics/antiplatelets: Secondary | ICD-10-CM

## 2016-08-21 DIAGNOSIS — Z885 Allergy status to narcotic agent status: Secondary | ICD-10-CM

## 2016-08-21 DIAGNOSIS — Z923 Personal history of irradiation: Secondary | ICD-10-CM

## 2016-08-21 DIAGNOSIS — C801 Malignant (primary) neoplasm, unspecified: Secondary | ICD-10-CM | POA: Diagnosis not present

## 2016-08-21 DIAGNOSIS — Z888 Allergy status to other drugs, medicaments and biological substances status: Secondary | ICD-10-CM

## 2016-08-21 LAB — COMPREHENSIVE METABOLIC PANEL
ALK PHOS: 75 U/L (ref 38–126)
ALT: 14 U/L (ref 0–55)
ALT: 16 U/L (ref 14–54)
ANION GAP: 7 (ref 5–15)
AST: 34 U/L (ref 5–34)
AST: 39 U/L (ref 15–41)
Albumin: 2.4 g/dL — ABNORMAL LOW (ref 3.5–5.0)
Albumin: 3 g/dL — ABNORMAL LOW (ref 3.5–5.0)
Alkaline Phosphatase: 88 U/L (ref 40–150)
Anion Gap: 12 mEq/L — ABNORMAL HIGH (ref 3–11)
BUN: 19.4 mg/dL (ref 7.0–26.0)
BUN: 22 mg/dL — ABNORMAL HIGH (ref 6–20)
CALCIUM: 9.7 mg/dL (ref 8.4–10.4)
CALCIUM: 9.4 mg/dL (ref 8.9–10.3)
CHLORIDE: 101 meq/L (ref 98–109)
CO2: 29 mEq/L (ref 22–29)
CO2: 32 mmol/L (ref 22–32)
CREATININE: 0.84 mg/dL (ref 0.44–1.00)
Chloride: 100 mmol/L — ABNORMAL LOW (ref 101–111)
Creatinine: 0.8 mg/dL (ref 0.6–1.1)
EGFR: 77 mL/min/{1.73_m2} — ABNORMAL LOW (ref >90)
GLUCOSE: 172 mg/dL — AB (ref 70–140)
Glucose, Bld: 172 mg/dL — ABNORMAL HIGH (ref 65–99)
POTASSIUM: 3.8 meq/L (ref 3.5–5.1)
Potassium: 3.4 mmol/L — ABNORMAL LOW (ref 3.5–5.1)
SODIUM: 143 meq/L (ref 136–145)
Sodium: 139 mmol/L (ref 135–145)
Total Bilirubin: 0.31 mg/dL (ref 0.20–1.20)
Total Bilirubin: 0.6 mg/dL (ref 0.3–1.2)
Total Protein: 5.7 g/dL — ABNORMAL LOW (ref 6.4–8.3)
Total Protein: 6 g/dL — ABNORMAL LOW (ref 6.5–8.1)

## 2016-08-21 LAB — CBC
HCT: 23.1 % — ABNORMAL LOW (ref 36.0–46.0)
Hemoglobin: 6.8 g/dL — CL (ref 12.0–15.0)
MCH: 24 pg — ABNORMAL LOW (ref 26.0–34.0)
MCHC: 29.4 g/dL — ABNORMAL LOW (ref 30.0–36.0)
MCV: 81.6 fL (ref 78.0–100.0)
PLATELETS: 159 10*3/uL (ref 150–400)
RBC: 2.83 MIL/uL — AB (ref 3.87–5.11)
RDW: 17.7 % — ABNORMAL HIGH (ref 11.5–15.5)
WBC: 3.8 10*3/uL — AB (ref 4.0–10.5)

## 2016-08-21 LAB — HEMOGLOBIN AND HEMATOCRIT, BLOOD
HEMATOCRIT: 28.4 % — AB (ref 36.0–46.0)
HEMOGLOBIN: 9 g/dL — AB (ref 12.0–15.0)

## 2016-08-21 LAB — CBC WITH DIFFERENTIAL/PLATELET
BASO%: 0.5 % (ref 0.0–2.0)
Basophils Absolute: 0 10*3/uL (ref 0.0–0.1)
EOS%: 1.8 % (ref 0.0–7.0)
Eosinophils Absolute: 0.1 10*3/uL (ref 0.0–0.5)
HCT: 22.7 % — ABNORMAL LOW (ref 34.8–46.6)
HGB: 6.6 g/dL — CL (ref 11.6–15.9)
LYMPH%: 23.3 % (ref 14.0–49.7)
MCH: 23.5 pg — AB (ref 25.1–34.0)
MCHC: 29.1 g/dL — AB (ref 31.5–36.0)
MCV: 80.8 fL (ref 79.5–101.0)
MONO#: 0.4 10*3/uL (ref 0.1–0.9)
MONO%: 9.8 % (ref 0.0–14.0)
NEUT#: 2.5 10*3/uL (ref 1.5–6.5)
NEUT%: 64.6 % (ref 38.4–76.8)
Platelets: 140 10*3/uL — ABNORMAL LOW (ref 145–400)
RBC: 2.81 10*6/uL — AB (ref 3.70–5.45)
RDW: 18 % — ABNORMAL HIGH (ref 11.2–14.5)
WBC: 3.9 10*3/uL (ref 3.9–10.3)
lymph#: 0.9 10*3/uL (ref 0.9–3.3)
nRBC: 1 % — ABNORMAL HIGH (ref 0–0)

## 2016-08-21 LAB — PROTIME-INR
INR: 1.09
Prothrombin Time: 14.2 seconds (ref 11.4–15.2)

## 2016-08-21 LAB — PREPARE RBC (CROSSMATCH)

## 2016-08-21 LAB — TECHNOLOGIST REVIEW

## 2016-08-21 LAB — MRSA PCR SCREENING: MRSA by PCR: POSITIVE — AB

## 2016-08-21 LAB — TSH: TSH: 2.039 m[IU]/L (ref 0.308–3.960)

## 2016-08-21 LAB — GLUCOSE, CAPILLARY: Glucose-Capillary: 91 mg/dL (ref 65–99)

## 2016-08-21 LAB — POC OCCULT BLOOD, ED: Fecal Occult Bld: POSITIVE — AB

## 2016-08-21 MED ORDER — INSULIN ASPART 100 UNIT/ML ~~LOC~~ SOLN
0.0000 [IU] | SUBCUTANEOUS | Status: DC
  Administered 2016-08-22: 1 [IU] via SUBCUTANEOUS
  Administered 2016-08-22: 2 [IU] via SUBCUTANEOUS
  Administered 2016-08-22: 1 [IU] via SUBCUTANEOUS
  Administered 2016-08-23: 2 [IU] via SUBCUTANEOUS
  Administered 2016-08-23: 1 [IU] via SUBCUTANEOUS
  Administered 2016-08-23 – 2016-08-24 (×5): 2 [IU] via SUBCUTANEOUS
  Administered 2016-08-24: 3 [IU] via SUBCUTANEOUS
  Administered 2016-08-24: 2 [IU] via SUBCUTANEOUS

## 2016-08-21 MED ORDER — SODIUM CHLORIDE 0.9 % IV SOLN: Freq: Once | INTRAVENOUS | Status: DC

## 2016-08-21 MED ORDER — PROMETHAZINE HCL 25 MG/ML IJ SOLN
12.5000 mg | Freq: Once | INTRAMUSCULAR | Status: AC
  Administered 2016-08-21: 12.5 mg via INTRAVENOUS
  Filled 2016-08-21: qty 1

## 2016-08-21 MED ORDER — SODIUM CHLORIDE 0.9% FLUSH
3.0000 mL | Freq: Two times a day (BID) | INTRAVENOUS | Status: DC
  Administered 2016-08-21 – 2016-08-24 (×3): 3 mL via INTRAVENOUS

## 2016-08-21 MED ORDER — PANTOPRAZOLE SODIUM 40 MG IV SOLR
40.0000 mg | Freq: Two times a day (BID) | INTRAVENOUS | Status: DC
  Administered 2016-08-21 – 2016-08-22 (×2): 40 mg via INTRAVENOUS
  Filled 2016-08-21 (×2): qty 40

## 2016-08-21 MED ORDER — PANTOPRAZOLE SODIUM 40 MG IV SOLR: 40.0000 mg | Freq: Two times a day (BID) | INTRAVENOUS | Status: DC

## 2016-08-21 MED ORDER — UMECLIDINIUM BROMIDE 62.5 MCG/INH IN AEPB
1.0000 | INHALATION_SPRAY | Freq: Every day | RESPIRATORY_TRACT | Status: DC
  Administered 2016-08-21 – 2016-08-24 (×4): 1 via RESPIRATORY_TRACT
  Filled 2016-08-21: qty 7

## 2016-08-21 MED ORDER — PREGABALIN 50 MG PO CAPS
50.0000 mg | ORAL_CAPSULE | Freq: Two times a day (BID) | ORAL | Status: DC
  Administered 2016-08-21 – 2016-08-25 (×8): 50 mg via ORAL
  Filled 2016-08-21 (×9): qty 1

## 2016-08-21 MED ORDER — SENNA 8.6 MG PO TABS
2.0000 | ORAL_TABLET | Freq: Every day | ORAL | Status: DC
  Administered 2016-08-21 – 2016-08-23 (×2): 17.2 mg via ORAL
  Filled 2016-08-21 (×2): qty 2

## 2016-08-21 MED ORDER — POLYVINYL ALCOHOL 1.4 % OP SOLN
1.0000 [drp] | Freq: Two times a day (BID) | OPHTHALMIC | Status: DC
Start: 2016-08-21 — End: 2016-08-25
  Administered 2016-08-21 – 2016-08-25 (×7): 1 [drp] via OPHTHALMIC
  Filled 2016-08-21 (×2): qty 15

## 2016-08-21 MED ORDER — POLYETHYL GLYCOL-PROPYL GLYCOL 0.4-0.3 % OP SOLN: 1.0000 [drp] | Freq: Two times a day (BID) | OPHTHALMIC | Status: DC

## 2016-08-21 MED ORDER — SODIUM CHLORIDE 0.9 % IJ SOLN
INTRAMUSCULAR | Status: AC
  Filled 2016-08-21: qty 50

## 2016-08-21 MED ORDER — IOPAMIDOL (ISOVUE-300) INJECTION 61%
INTRAVENOUS | Status: AC
  Filled 2016-08-21: qty 100

## 2016-08-21 MED ORDER — ALBUTEROL SULFATE (2.5 MG/3ML) 0.083% IN NEBU: 2.5000 mg | INHALATION_SOLUTION | Freq: Four times a day (QID) | RESPIRATORY_TRACT | Status: DC | PRN

## 2016-08-21 MED ORDER — OXYCODONE HCL 5 MG PO TABS
10.0000 mg | ORAL_TABLET | ORAL | Status: DC | PRN
  Administered 2016-08-21 – 2016-08-24 (×5): 10 mg via ORAL
  Filled 2016-08-21 (×5): qty 2

## 2016-08-21 MED ORDER — PANTOPRAZOLE SODIUM 40 MG IV SOLR
40.0000 mg | Freq: Once | INTRAVENOUS | Status: AC
  Administered 2016-08-21: 40 mg via INTRAVENOUS
  Filled 2016-08-21: qty 40

## 2016-08-21 MED ORDER — KCL IN DEXTROSE-NACL 20-5-0.9 MEQ/L-%-% IV SOLN
INTRAVENOUS | Status: DC
  Administered 2016-08-22 – 2016-08-23 (×3): via INTRAVENOUS
  Administered 2016-08-23: 1000 mL via INTRAVENOUS
  Administered 2016-08-24 (×2): via INTRAVENOUS
  Filled 2016-08-21 (×7): qty 1000

## 2016-08-21 MED ORDER — ONDANSETRON HCL 4 MG/2ML IJ SOLN
4.0000 mg | Freq: Four times a day (QID) | INTRAMUSCULAR | Status: DC | PRN
  Administered 2016-08-22 – 2016-08-23 (×2): 4 mg via INTRAVENOUS
  Filled 2016-08-21 (×2): qty 2

## 2016-08-21 MED ORDER — INSULIN DETEMIR 100 UNIT/ML ~~LOC~~ SOLN
25.0000 [IU] | Freq: Two times a day (BID) | SUBCUTANEOUS | Status: DC
  Administered 2016-08-21 – 2016-08-24 (×6): 25 [IU] via SUBCUTANEOUS
  Filled 2016-08-21 (×9): qty 0.25

## 2016-08-21 MED ORDER — BISACODYL 10 MG RE SUPP: 10.0000 mg | Freq: Every day | RECTAL | Status: DC | PRN

## 2016-08-21 MED ORDER — IOPAMIDOL (ISOVUE-300) INJECTION 61%
100.0000 mL | Freq: Once | INTRAVENOUS | Status: AC | PRN
  Administered 2016-08-21: 100 mL via INTRAVENOUS

## 2016-08-21 MED ORDER — NITROGLYCERIN 0.4 MG SL SUBL: 0.4000 mg | SUBLINGUAL_TABLET | SUBLINGUAL | Status: DC | PRN

## 2016-08-21 MED ORDER — FLUTICASONE FUROATE-VILANTEROL 100-25 MCG/INH IN AEPB
1.0000 | INHALATION_SPRAY | Freq: Every day | RESPIRATORY_TRACT | Status: DC
  Administered 2016-08-21 – 2016-08-24 (×4): 1 via RESPIRATORY_TRACT
  Filled 2016-08-21: qty 28

## 2016-08-21 MED ORDER — PROMETHAZINE HCL 25 MG PO TABS
25.0000 mg | ORAL_TABLET | Freq: Four times a day (QID) | ORAL | Status: DC | PRN
  Administered 2016-08-21 – 2016-08-23 (×4): 25 mg via ORAL
  Filled 2016-08-21 (×4): qty 1

## 2016-08-21 NOTE — ED Notes (Signed)
Bed: RESA Expected date:  Expected time:  Means of arrival:  Comments: Cancer center- [facility]

## 2016-08-21 NOTE — Consult Note (Signed)
Consultation  Referring Provider: Er MD/Goldston MD Primary Care Physician:  [name], MD Primary Gastroenterologist:  Dr.Jacobs  Reason for Consultation:  GI bleed   HPI: [name] is a 70 y.o. female  admitted through the emergency room this afternoon after being seen in the oncology clinic this morning with complaints of weakness and lightheadedness increased shortness of breath and burning upper abdominal pain. She stated she had been having very dark stools over the past 3 weeks. Blood pressure was 112/50, pulse 128. Labs were done and showed a hemoglobin in the 6 range. Her baseline was 10 about 3 weeks ago. Patient has no prior history of GI bleeding, she did have EGD done in 2011 per Dr. Ardis Hughs for complaints of dysphagia and was found to have a mild GE junction stricture which was dilated, exam was otherwise negative.  Patient has a recurrent non-small cell lung cancer, which was again initially diagnosed in 2010. She had undergone a right upper lobe lobectomy. She had recurrence earlier this fall and on CT in mid-October had extensive collapse and consolidation of her right lung with disease progression as well as enlarged mediastinal nodes. She started radiation therapy and completed about 6 sessions and then stopped about 2 weeks ago because of complaints of severe burning in her lower chest and upper abdomen. Patient has history of coronary artery disease, she has 4 coronary stents, her last procedure was done in February 2017, and she is being maintained on Plavix and aspirin. She also has chronic respiratory failure and is on home O2 at 3 L. Also insulin-dependent diabetic. Patient has been hemodynamically stable in the emergency room after mild hypotension on arrival. She is currently being transfused 2 units of packed RBCs. She denies any chest pain, no shortness of breath at rest She has not had any vomiting though does complain of some nausea. She says she has been having  solid food dysphagia and some odynophagia as well over the past couple of weeks. She has not had any hematemesis. No chronic PPI therapy, no NSAIDs. CT of the abdomen and pelvis was done today with finding of dense consolidation in the right lower lobe with masslike consolidation in the infrahilar right lung measuring 5.8 x 3.8 cm also is moderate right effusion. There is thickening in the distal esophagus, and enlarged gastrohepatic ligament lymph nodes and periaortic adenopathy.   Past Medical History:  Diagnosis Date  . Adenocarcinoma of right lung, stage 3 ([place]) 08/28/2009      . Asthma   . Cholelithiasis   . Chronic bronchitis ([place])   . Colon polyps   . COPD (chronic obstructive pulmonary disease) ([facility])    USES O2 AT NIGHT + PRN IN DAYTIME3L  . Coronary artery disease    a. 2005 Cath/PCI: mRCA-> [place] DES;  b. 06/2014 Cath/PCI: EF 60%. LM nl, LAD min irregs, D1 40, LCX 20p, OM2 95 (2.5x14 Resolute DES), RCA 30p, 30 ISR, 20d, PDA/PLA nl..  11/2015  Stenting of the entire RCA and LV branch off the RCA.    . Emphysema   . FH: chemotherapy 2010    had 4 times  . Fibromyalgia   . GERD (gastroesophageal reflux disease)   . H/O hiatal hernia   . Hyperlipidemia   . Hypertension   . Lung cancer ([place]) 2010   Adenocarcinoma, right lung, node positive  . On home oxygen therapy    "2-4L at night and prn" (07/18/2104)  . Oxygen dependent  at night  3liters  . Peripheral vascular disease ([place])   . Pneumonia    hx of   . Radiation 6/10   28 times right lung, and 5 treatments to sternum  . Stress incontinence   . Tobacco abuse   . Type II diabetes mellitus ([place])     Past Surgical History:  Procedure Laterality Date  . ABDOMINAL HYSTERECTOMY  1972  . ABDOMINAL HYSTERECTOMY    . APPENDECTOMY  1970  . arm fracture Left 3/14  . BACK SURGERY  2007  . CARDIAC CATHETERIZATION N/A 11/09/2015   Procedure: Left Heart Cath and Coronary Angiography;  Surgeon: Belva Crome, MD;  Location: [facility];  Service: Cardiovascular;  Laterality: N/A;  . CARDIAC CATHETERIZATION Right 11/09/2015   Procedure: Intravascular Pressure Wire/FFR Study;  Surgeon: Belva Crome, MD;  Location: [facility];  Service: Cardiovascular;  Laterality: Right;  . CARDIAC CATHETERIZATION N/A 11/09/2015   Procedure: Coronary Stent Intervention;  Surgeon: Belva Crome, MD;  Location: [facility];  Service: Cardiovascular;  Laterality: N/A;  RCA  . CARPAL TUNNEL RELEASE    . CORONARY ANGIOPLASTY  2005  . EYE SURGERY     implants 02/18/12 (Left) 02/25/12 (right eye)  . FEMUR FRACTURE SURGERY Left    10/15  . FEMUR IM NAIL Left 07/19/2014   Procedure: INTRAMEDULLARY (IM) NAIL FEMORAL;  Surgeon: Alta Corning, MD;  Location: [facility];  Service: Orthopedics;  Laterality: Left;  . GALLBLADDER SURGERY  1989  . HARDWARE REMOVAL Left 07/19/2015   Procedure: HARDWARE REMOVAL;  Surgeon: Mcarthur Rossetti, MD;  Location: [facility];  Service: Orthopedics;  Laterality: Left;  . JOINT REPLACEMENT    . LEFT HEART CATHETERIZATION WITH CORONARY ANGIOGRAM N/A 06/20/2014   Procedure: LEFT HEART CATHETERIZATION WITH CORONARY ANGIOGRAM;  Surgeon: Troy Sine, MD;  Location: [facility];  Service: Cardiovascular;  Laterality: N/A;  . NECK SURGERY  2005  . ORIF HUMERUS FRACTURE Left 12/14/2012   Procedure: OPEN REDUCTION INTERNAL FIXATION (ORIF) LEFT PROXIMAL HUMERUS FRACTURE;  Surgeon: Mcarthur Rossetti, MD;  Location: [facility];  Service: Orthopedics;  Laterality: Left;  . PNEUMONECTOMY     Right Partial, lung cancer confirmed, nod positive  . PORT-A-CATH REMOVAL  05/12/2012   Procedure: REMOVAL PORT-A-CATH;  Surgeon: Melrose Nakayama, MD;  Location: [facility];  Service: Thoracic;  Laterality: Left;  . PORTACATH PLACEMENT  12/12/2007  . REFRACTIVE SURGERY Bilateral 2/14  . REPLACEMENT TOTAL KNEE  2001   left  . Right Upper Lobectomy with lymph node dissection  10/12/2008   Dr Arlyce Dice  . ROTATOR CUFF REPAIR       RIGHT SHOULDER  2 YRS  . TONSILLECTOMY  1966  . TOTAL KNEE REVISION Left 07/19/2015   Procedure: Removal Intramedullary Rod and Screws Left Knee/Femur, Revision arthroplasty Left knee;  Surgeon: Mcarthur Rossetti, MD;  Location: [facility];  Service: Orthopedics;  Laterality: Left;    Prior to Admission medications   Medication Sig Start Date End Date Taking? Authorizing Provider  acetaminophen (TYLENOL) 500 MG tablet Take 1,000 mg by mouth every 6 (six) hours as needed for mild pain.     Historical Provider, MD  albuterol (PROVENTIL) (2.5 MG/3ML) 0.083% nebulizer solution Take 3 mLs (2.5 mg total) by nebulization every 6 (six) hours as needed for wheezing or shortness of breath. 08/08/16   Rigoberto Noel, MD  aspirin EC 81 MG tablet Take 81 mg by mouth daily.  Historical Provider, MD  bisacodyl (DULCOLAX) 10 MG suppository Place 1 suppository (10 mg total) rectally daily as needed for mild constipation or moderate constipation. 07/25/16   Janece Canterbury, MD  Cholecalciferol (VITAMIN D) 1000 UNITS capsule Take 1,000 Units by mouth daily.     Historical Provider, MD  clopidogrel (PLAVIX) 75 MG tablet Take 1 tablet (75 mg total) by mouth daily with breakfast. 11/10/15   Brett Canales, PA-C  empagliflozin (JARDIANCE) 25 MG TABS tablet Take 25 mg by mouth daily.    Historical Provider, MD  esomeprazole (NEXIUM) 40 MG capsule Take 40 mg by mouth as needed (for heartburn/ acid reflux).     Historical Provider, MD  fluticasone furoate-vilanterol (BREO ELLIPTA) 100-25 MCG/INH AEPB Inhale 1 puff into the lungs daily.    Historical Provider, MD  furosemide (LASIX) 20 MG tablet Take 1 tablet (20 mg total) by mouth daily as needed for edema. 06/20/16   Kelvin Cellar, MD  insulin aspart (NOVOLOG) 100 UNIT/ML injection Inject 1,015 Units into the skin as needed. Per sliding scale: CBG <200 15 units, 200-260 20 units, >260 25 units    Historical Provider, MD  insulin detemir (LEVEMIR) 100 UNIT/ML injection  Inject 45-55 Units into the skin 2 (two) times daily. Inject 55 units subcutaneously every morning and 45 units at bedtime    Historical Provider, MD  lovastatin (MEVACOR) 40 MG tablet Take 40 mg by mouth daily after supper.    Historical Provider, MD  metFORMIN (GLUCOPHAGE) 500 MG tablet Take 1 tablet (500 mg total) by mouth 2 (two) times daily with a meal. 11/10/15   Brett Canales, PA-C  metoprolol tartrate (LOPRESSOR) 25 MG tablet TAKE 1 TABLET BY MOUTH IN THE AM & 1/2 TAB BY MOUTH IN THE PM 05/02/16   Minus Breeding, MD  nitroGLYCERIN (NITROSTAT) 0.4 MG SL tablet Place 1 tablet (0.4 mg total) under the tongue every 5 (five) minutes x 3 doses as needed for chest pain. 06/21/14   Erlene Quan, PA-C  nystatin (MYCOSTATIN/NYSTOP) powder Apply topically 3 (three) times daily. 07/25/16   Janece Canterbury, MD  oxyCODONE 10 MG TABS Take 1 tablet (10 mg total) by mouth every 4 (four) hours as needed for severe pain. 07/25/16   Janece Canterbury, MD  Polyethyl Glycol-Propyl Glycol (SYSTANE) 0.4-0.3 % SOLN Place 1 drop into both eyes 2 (two) times daily.     Historical Provider, MD  polyethylene glycol (MIRALAX / GLYCOLAX) packet Take 17 g by mouth 2 (two) times daily. 07/25/16   Janece Canterbury, MD  pregabalin (LYRICA) 50 MG capsule Take 50 mg by mouth 2 (two) times daily.     Historical Provider, MD  promethazine (PHENERGAN) 25 MG tablet Take 25 mg by mouth every 6 (six) hours as needed for nausea or vomiting.    Celene Squibb, MD  senna (SENOKOT) 8.6 MG TABS tablet Take 2 tablets (17.2 mg total) by mouth at bedtime. 07/25/16   Janece Canterbury, MD  umeclidinium bromide (INCRUSE ELLIPTA) 62.5 MCG/INH AEPB Inhale 1 puff into the lungs daily.    Historical Provider, MD    Current Facility-Administered Medications  Medication Dose Route Frequency Provider Last Rate Last Dose  . 0.9 %  sodium chloride infusion   Intravenous Once Sherwood Gambler, MD      . iopamidol (ISOVUE-300) 61 % injection           . sodium  chloride 0.9 % injection  Current Outpatient Prescriptions  Medication Sig Dispense Refill  . acetaminophen (TYLENOL) 500 MG tablet Take 1,000 mg by mouth every 6 (six) hours as needed for mild pain.     [name] albuterol (PROVENTIL) (2.5 MG/3ML) 0.083% nebulizer solution Take 3 mLs (2.5 mg total) by nebulization every 6 (six) hours as needed for wheezing or shortness of breath. 75 mL 5  . aspirin EC 81 MG tablet Take 81 mg by mouth daily.     . bisacodyl (DULCOLAX) 10 MG suppository Place 1 suppository (10 mg total) rectally daily as needed for mild constipation or moderate constipation. 12 suppository 0  . Cholecalciferol (VITAMIN D) 1000 UNITS capsule Take 1,000 Units by mouth daily.     . clopidogrel (PLAVIX) 75 MG tablet Take 1 tablet (75 mg total) by mouth daily with breakfast. 30 tablet 11  . empagliflozin (JARDIANCE) 25 MG TABS tablet Take 25 mg by mouth daily.    [name] esomeprazole (NEXIUM) 40 MG capsule Take 40 mg by mouth as needed (for heartburn/ acid reflux).     . fluticasone furoate-vilanterol (BREO ELLIPTA) 100-25 MCG/INH AEPB Inhale 1 puff into the lungs daily.    . furosemide (LASIX) 20 MG tablet Take 1 tablet (20 mg total) by mouth daily as needed for edema. 20 tablet 0  . insulin aspart (NOVOLOG) 100 UNIT/ML injection Inject 1,015 Units into the skin as needed. Per sliding scale: CBG <200 15 units, 200-260 20 units, >260 25 units    . insulin detemir (LEVEMIR) 100 UNIT/ML injection Inject 45-55 Units into the skin 2 (two) times daily. Inject 55 units subcutaneously every morning and 45 units at bedtime    . lovastatin (MEVACOR) 40 MG tablet Take 40 mg by mouth daily after supper.    . metFORMIN (GLUCOPHAGE) 500 MG tablet Take 1 tablet (500 mg total) by mouth 2 (two) times daily with a meal.    . metoprolol tartrate (LOPRESSOR) 25 MG tablet TAKE 1 TABLET BY MOUTH IN THE AM & 1/2 TAB BY MOUTH IN THE PM 135 tablet 3  . nitroGLYCERIN (NITROSTAT) 0.4 MG SL tablet Place 1 tablet (0.4  mg total) under the tongue every 5 (five) minutes x 3 doses as needed for chest pain. 25 tablet 2  . nystatin (MYCOSTATIN/NYSTOP) powder Apply topically 3 (three) times daily. 45 g 0  . oxyCODONE 10 MG TABS Take 1 tablet (10 mg total) by mouth every 4 (four) hours as needed for severe pain. 30 tablet 0  . Polyethyl Glycol-Propyl Glycol (SYSTANE) 0.4-0.3 % SOLN Place 1 drop into both eyes 2 (two) times daily.     . polyethylene glycol (MIRALAX / GLYCOLAX) packet Take 17 g by mouth 2 (two) times daily. 100 each 0  . pregabalin (LYRICA) 50 MG capsule Take 50 mg by mouth 2 (two) times daily.     . promethazine (PHENERGAN) 25 MG tablet Take 25 mg by mouth every 6 (six) hours as needed for nausea or vomiting.    . senna (SENOKOT) 8.6 MG TABS tablet Take 2 tablets (17.2 mg total) by mouth at bedtime. 120 each 0  . umeclidinium bromide (INCRUSE ELLIPTA) 62.5 MCG/INH AEPB Inhale 1 puff into the lungs daily.      Allergies as of 08/21/2016 - Review Complete 08/21/2016  Allergen Reaction Noted  . Celebrex [celecoxib] Palpitations and Other (See Comments) 07/11/2013  . Niacin Rash   . Percocet [oxycodone-acetaminophen] Palpitations 02/02/2015  . Varenicline tartrate Rash     Family History  Problem Relation Age  of Onset  . Diabetes Mother   . Heart failure Mother   . Diabetes Father   . CAD Father 65  . Arthritis    . Lung disease    . Cancer    . Asthma      Social History   Social History  . Marital status: Widowed    Spouse name: N/A  . Number of children: N/A  . Years of education: N/A   Occupational History  . disabled    Social History Main Topics  . Smoking status: Former Smoker    Packs/day: 1.00    Years: 53.00    Types: Cigarettes    Quit date: 06/17/2016  . Smokeless tobacco: Never Used  . Alcohol use No  . Drug use: No  . Sexual activity: No   Other Topics Concern  . Not on file   Social History Narrative   Daughter lives with her.  Also her mother and grandson  also live with her.       Review of Systems:  all ROS negative except as in HPI  Physical Exam: Vital signs in last 24 hours: Temp:  [97.8 F (36.6 C)-99 F (37.2 C)] 98.2 F (36.8 C) (11/16 1546) Pulse Rate:  [110-128] 115 (11/16 1600) Resp:  [16-27] 20 (11/16 1600) BP: (89-150)/(51-79) 139/71 (11/16 1600) SpO2:  [93 %-98 %] 98 % (11/16 1600) Weight:  [198 lb 6.4 oz (90 kg)] 198 lb 6.4 oz (90 kg) (11/16 1207)   General:   Alert,  Well-developed, , Pale, elderly white female pleasant and cooperative in NAD. No increased work of breathing on 3 L nasal O2 Head:  Normocephalic and atraumatic. Eyes:  Sclera clear, no icterus.   Conjunctiva pale Ears:  Normal auditory acuity. Nose:  No deformity, discharge,  or lesions. Mouth:  No deformity or lesions.   Neck:  Supple; no masses or thyromegaly. Lungs:  Decreased breath sounds throughout right lung greater than left, no shortness of breath at rest  Heart:  Regular rate and rhythm; no murmurs, clicks, rubs,  or gallops. Abdomen:  Soft, tender in the epigastrium and across the upper abdomen, BS active,nonpalp mass or hsm. Bowel sounds are present  Rectal:  Deferred  Msk:  Symmetrical without gross deformities. Status post left knee replacement . Pulses:  Normal pulses noted. Extremities:  Without clubbing or edema. Neurologic:  Alert and  oriented x4;  grossly normal neurologically. Skin:  Intact without significant lesions or rashes.. Psych:  Alert and cooperative. Normal mood and affect.  Intake/Output from previous day: No intake/output data recorded. Intake/Output this shift: Total I/O In: 500 [I.V.:500] Out: -   Lab Results:  Recent Labs  08/21/16 1147 08/21/16 1343  WBC 3.9 3.8*  HGB 6.6* 6.8*  HCT 22.7* 23.1*  PLT 140* 159   BMET  Recent Labs  08/21/16 1147 08/21/16 1343  NA 143 139  K 3.8 3.4*  CL  --  100*  CO2 29 32  GLUCOSE 172* 172*  BUN 19.4 22*  CREATININE 0.8 0.84  CALCIUM 9.7 9.4    LFT  Recent Labs  08/21/16 1343  PROT 6.0*  ALBUMIN 3.0*  AST 39  ALT 16  ALKPHOS 75  BILITOT 0.6   PT/INR  Recent Labs  08/21/16 1343  LABPROT 14.2  INR 1.09   Hepatitis Panel No results for input(s): HEPBSAG, HCVAB, HEPAIGM, HEPBIGM in the last 72 hours.    IMPRESSION:   #51 70 year old white female with recurrent non-small cell lung  cancer now with extensive involvement of the right long, with dense consolidation and masslike consolidation of the right lower lobe, and new development of enlarged gastrohepatic ligament and. Aortic adenopathy. 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 Hilarie Fredrickson in a.m. tomorrow 08/22/2016. Procedure was discussed in detail with the patient including risks and benefits and she is agreeable to proceed Further recommendations pending findings at EGD.  Will hold on adding Carafate until post EGD      Amy Esterwood  08/21/2016, 4:36 PM

## 2016-08-21 NOTE — ED Notes (Signed)
Patient returned from [place]. No IV pump available to infuse blood. Portable called by ED secretary for IV pump.

## 2016-08-21 NOTE — H&P (Signed)
History and Physical   [name] BJS:[id number] DOB: [birth year] DOA: 08/21/2016  Referring MD/NP/PA: Dr. Regenia Skeeter, EDP PCP: [name], MD Outpatient Specialists:  - GI Dr. Ardis Hughs - Oncology Dr. Julien Nordmann   Patient coming from: Home by way of cancer center  Chief Complaint: Symptomatic anemia  HPI: [name] is a 70 y.o. female with a history of NSCLC s/p lobectomy, adjuvant chemo, postadjuvant XRT and most recently palliative radiation who presented to the [facility] cancer center complaining of dyspnea, weakness, poor appetite, and dark stools. She was found to be anemic (hgb 6.6), sent to the ED and is being admitted for active GI bleed causing acute on chronic anemia.   She endorses 3 weeks of nausea worsening from her baseline with any movement or anything po, decreased po, frequent dysphagia/odynophagia solids > liquids. Emesis nearly daily without hematemesis, but has noted darkening of brown formed stool during this time associated with fatigue, weakness. She took medications today which include aspirin and plavix s/p PCI in Feb 2017. She's required 4L O2 by [place] since discharge from hospital 10/20.   ED Course: On arrival she was oriented in no distress, afebrile, tachycardic, with diffuse pallor and abdominal tenderness. Hgb was 6.8 with hypochromic indices, WBC 3.8, FOBT+, INR 1.09. CT abdomen showed diffuse esophageal thickening without evidence of perforation, right infrahilar consolidation, and gastrohepatic/periaortic adenopathy. Tachycardia improved modestly with IVF. Transfusion of 2u PRBCs started, IV PPI given, and GI consulted for evaluation in ED. She will be admitted to SDU for intensive hemodynamic monitoring of symptomatic anemia from active GI bleed.  Review of Systems: No fevers, and per HPI. All others reviewed and are negative.   Past Medical History:  Diagnosis Date  . Adenocarcinoma of right lung, stage 3 ([place]) 08/28/2009      . Asthma   . Cholelithiasis   .  Chronic bronchitis ([place])   . Colon polyps   . COPD (chronic obstructive pulmonary disease) (HCC)    USES O2 AT NIGHT + PRN IN DAYTIME3L  . Coronary artery disease    a. 2005 Cath/PCI: mRCA-> Cypher DES;  b. 06/2014 Cath/PCI: EF 60%. LM nl, LAD min irregs, D1 40, LCX 20p, OM2 95 (2.5x14 Resolute DES), RCA 30p, 30 ISR, 20d, PDA/PLA nl..  11/2015  Stenting of the entire RCA and LV branch off the RCA.    . Emphysema   . FH: chemotherapy 2010    had 4 times  . Fibromyalgia   . GERD (gastroesophageal reflux disease)   . H/O hiatal hernia   . Hyperlipidemia   . Hypertension   . Lung cancer ([place]) 2010   Adenocarcinoma, right lung, node positive  . On home oxygen therapy    "2-4L at night and prn" (07/18/2104)  . Oxygen dependent    at night  3liters  . Peripheral vascular disease ([place])   . Pneumonia    hx of   . Radiation 6/10   28 times right lung, and 5 treatments to sternum  . Stress incontinence   . Tobacco abuse   . Type II diabetes mellitus ([place])     Past Surgical History:  Procedure Laterality Date  . ABDOMINAL HYSTERECTOMY  1972  . ABDOMINAL HYSTERECTOMY    . APPENDECTOMY  1970  . arm fracture Left 3/14  . BACK SURGERY  2007  . CARDIAC CATHETERIZATION N/A 11/09/2015   Procedure: Left Heart Cath and Coronary Angiography;  Surgeon: Belva Crome, MD;  Location: [place] [facility];  Service:  Cardiovascular;  Laterality: N/A;  . CARDIAC CATHETERIZATION Right 11/09/2015   Procedure: Intravascular Pressure Wire/FFR Study;  Surgeon: Belva Crome, MD;  Location: [place] [facility];  Service: Cardiovascular;  Laterality: Right;  . CARDIAC CATHETERIZATION N/A 11/09/2015   Procedure: Coronary Stent Intervention;  Surgeon: Belva Crome, MD;  Location: [place] [facility];  Service: Cardiovascular;  Laterality: N/A;  RCA  . CARPAL TUNNEL RELEASE    . CORONARY ANGIOPLASTY  2005  . EYE SURGERY     implants 02/18/12 (Left) 02/25/12 (right eye)  . FEMUR FRACTURE SURGERY Left    10/15  . FEMUR  IM NAIL Left 07/19/2014   Procedure: INTRAMEDULLARY (IM) NAIL FEMORAL;  Surgeon: Alta Corning, MD;  Location: [facility];  Service: Orthopedics;  Laterality: Left;  . GALLBLADDER SURGERY  1989  . HARDWARE REMOVAL Left 07/19/2015   Procedure: HARDWARE REMOVAL;  Surgeon: Mcarthur Rossetti, MD;  Location: [facility];  Service: Orthopedics;  Laterality: Left;  . JOINT REPLACEMENT    . LEFT HEART CATHETERIZATION WITH CORONARY ANGIOGRAM N/A 06/20/2014   Procedure: LEFT HEART CATHETERIZATION WITH CORONARY ANGIOGRAM;  Surgeon: Troy Sine, MD;  Location: [facility] CATH LAB;  Service: Cardiovascular;  Laterality: N/A;  . NECK SURGERY  2005  . ORIF HUMERUS FRACTURE Left 12/14/2012   Procedure: OPEN REDUCTION INTERNAL FIXATION (ORIF) LEFT PROXIMAL HUMERUS FRACTURE;  Surgeon: Mcarthur Rossetti, MD;  Location: [facility];  Service: Orthopedics;  Laterality: Left;  . PNEUMONECTOMY     Right Partial, lung cancer confirmed, nod positive  . PORT-A-CATH REMOVAL  05/12/2012   Procedure: REMOVAL PORT-A-CATH;  Surgeon: Melrose Nakayama, MD;  Location: [facility];  Service: Thoracic;  Laterality: Left;  . PORTACATH PLACEMENT  12/12/2007  . REFRACTIVE SURGERY Bilateral 2/14  . REPLACEMENT TOTAL KNEE  2001   left  . Right Upper Lobectomy with lymph node dissection  10/12/2008   Dr Arlyce Dice  . ROTATOR CUFF REPAIR     RIGHT SHOULDER  2 YRS  . TONSILLECTOMY  1966  . TOTAL KNEE REVISION Left 07/19/2015   Procedure: Removal Intramedullary Rod and Screws Left Knee/Femur, Revision arthroplasty Left knee;  Surgeon: Mcarthur Rossetti, MD;  Location: [facility];  Service: Orthopedics;  Laterality: Left;   - No longer smoking s/p 53 pack-years, no EtOH. Lives with daughter.   Allergies  Allergen Reactions  . Celebrex [Celecoxib] Palpitations and Other (See Comments)    Chest pain, tachycardia, diaphoresis  . Niacin Rash  . Percocet [Oxycodone-Acetaminophen] Palpitations    Update 07/23/16 - pt reports racing heart with Percocet,  but has tolerated oxycodone.  . Varenicline Tartrate Rash    Reaction to [place]    Family History  Problem Relation Age of Onset  . Diabetes Mother   . Heart failure Mother   . Diabetes Father   . CAD Father 23  . Arthritis    . Lung disease    . Cancer    . Asthma     - Family history otherwise reviewed and not pertinent.  Prior to Admission medications   Medication Sig Start Date End Date Taking? Authorizing Provider  aspirin EC 81 MG tablet Take 81 mg by mouth daily.    Yes Historical Provider, MD  Cholecalciferol (VITAMIN D) 1000 UNITS capsule Take 1,000 Units by mouth daily.    Yes Historical Provider, MD  clopidogrel (PLAVIX) 75 MG tablet Take 1 tablet (75 mg total) by mouth daily with breakfast. 11/10/15  Yes Brett Canales, PA-C  fluticasone furoate-vilanterol (BREO ELLIPTA) 100-25 MCG/INH AEPB Inhale 1 puff into the lungs 2 (two) times daily.    Yes Historical Provider, MD  furosemide (LASIX) 20 MG tablet Take 1 tablet (20 mg total) by mouth daily as needed for edema. 06/20/16  Yes Kelvin Cellar, MD  insulin detemir (LEVEMIR) 100 UNIT/ML injection Inject 45-55 Units into the skin 2 (two) times daily. Inject 55 units subcutaneously every morning and 45 units at bedtime   Yes Historical Provider, MD  lovastatin (MEVACOR) 40 MG tablet Take 20 mg by mouth daily after supper.    Yes Historical Provider, MD  oxyCODONE 10 MG TABS Take 1 tablet (10 mg total) by mouth every 4 (four) hours as needed for severe pain. 07/25/16  Yes Janece Canterbury, MD  Polyethyl Glycol-Propyl Glycol (SYSTANE) 0.4-0.3 % SOLN Place 1 drop into both eyes 2 (two) times daily.    Yes Historical Provider, MD  pregabalin (LYRICA) 50 MG capsule Take 50 mg by mouth 2 (two) times daily.    Yes Historical Provider, MD  promethazine (PHENERGAN) 25 MG tablet Take 25 mg by mouth every 6 (six) hours as needed for nausea or vomiting.   Yes Celene Squibb, MD  umeclidinium bromide (INCRUSE ELLIPTA) 62.5 MCG/INH AEPB Inhale 1  puff into the lungs 2 (two) times daily.   Yes Historical Provider, MD  acetaminophen (TYLENOL) 500 MG tablet Take 1,000 mg by mouth every 6 (six) hours as needed for mild pain.     Historical Provider, MD  albuterol (PROVENTIL) (2.5 MG/3ML) 0.083% nebulizer solution Take 3 mLs (2.5 mg total) by nebulization every 6 (six) hours as needed for wheezing or shortness of breath. 08/08/16   Rigoberto Noel, MD  bisacodyl (DULCOLAX) 10 MG suppository Place 1 suppository (10 mg total) rectally daily as needed for mild constipation or moderate constipation. 07/25/16   Janece Canterbury, MD  insulin aspart (NOVOLOG) 100 UNIT/ML injection Inject 15-25 Units into the skin as needed for high blood sugar. Per sliding scale: CBG <200 15 units, 200-260 20 units, >260 25 units    Historical Provider, MD  nitroGLYCERIN (NITROSTAT) 0.4 MG SL tablet Place 1 tablet (0.4 mg total) under the tongue every 5 (five) minutes x 3 doses as needed for chest pain. 06/21/14   Erlene Quan, PA-C  nystatin (MYCOSTATIN/NYSTOP) powder Apply topically 3 (three) times daily. 07/25/16   Janece Canterbury, MD  senna (SENOKOT) 8.6 MG TABS tablet Take 2 tablets (17.2 mg total) by mouth at bedtime. 07/25/16   Janece Canterbury, MD    Physical Exam: Vitals:   08/21/16 1747 08/21/16 1800 08/21/16 1815 08/21/16 1925  BP: (!) 100/48 (!) 100/48 (!) 97/52 (!) 104/58  Pulse: (!) 112 (!) 111 (!) 112 (!) 101  Resp: (!) 25 (!) 23 (!) 27 (!) 0  Temp:  97.6 F (36.4 C) 97.8 F (36.6 C) 98 F (36.7 C)  TempSrc: Oral Oral Oral Oral  SpO2: 98% 97% 98% 98%  Weight:      Height:       Constitutional: 70 y.o. female in no distress, calm demeanor Eyes: Lids and conjunctival pallor, PERRL ENMT: Mucous membranes are tacky. Posterior pharynx clear of any exudate or lesions.  Neck: normal, supple, no masses, no thyromegaly Respiratory: Non-labored breathing 3L O2 without accessory muscle use. Reduced bibasilar R > L breath sounds that are clear without crackles  or wheezes. Cardiovascular: Tachycardic rate, regular rhythm, no murmurs, rubs, or gallops. No carotid bruits. No JVD. No LE edema. 1+  pedal pulses. Abdomen: Normoactive bowel sounds. Diffusely tender without distention, rebound or guarding. No hepatosplenomegaly. GU: No indwelling catheter Musculoskeletal: Mild clubbing, no cyanosis. No joint deformity upper and lower extremities. Good ROM, no contractures. Normal muscle tone.  Skin: Warm, dry, diffuse pallor without rashes/wounds or extensive bruising.  Neurologic: CN II-XII grossly intact. Gait not assessed. Speech normal. No focal deficits in motor strength or sensation in all extremities.  Psychiatric: Alert and oriented x3. Normal judgment and insight. Mood euthymic with appropriate affect.   Labs on Admission: I have personally reviewed following labs and imaging studies  CBC:  Recent Labs Lab 08/21/16 1147 08/21/16 1343  WBC 3.9 3.8*  NEUTROABS 2.5  --   HGB 6.6* 6.8*  HCT 22.7* 23.1*  MCV 80.8 81.6  PLT 140* 035   Basic Metabolic Panel:  Recent Labs Lab 08/21/16 1147 08/21/16 1343  NA 143 139  K 3.8 3.4*  CL  --  100*  CO2 29 32  GLUCOSE 172* 172*  BUN 19.4 22*  CREATININE 0.8 0.84  CALCIUM 9.7 9.4   Liver Function Tests:  Recent Labs Lab 08/21/16 1147 08/21/16 1343  AST 34 39  ALT 14 16  ALKPHOS 88 75  BILITOT 0.31 0.6  PROT 5.7* 6.0*  ALBUMIN 2.4* 3.0*   Coagulation Profile:  Recent Labs Lab 08/21/16 1343  INR 1.09   Thyroid Function Tests:  Recent Labs  08/21/16 1147  TSH 2.039   Radiological Exams on Admission: Ct Abdomen Pelvis W Contrast  Result Date: 08/21/2016 CLINICAL DATA:  Upper abdominal pain, nausea and vomiting and dark stools EXAM: CT ABDOMEN AND PELVIS WITH CONTRAST TECHNIQUE: Multidetector CT imaging of the abdomen and pelvis was performed using the standard protocol following bolus administration of intravenous contrast. CONTRAST:  148m ISOVUE-300 IOPAMIDOL (ISOVUE-300)  INJECTION 61% COMPARISON:  12/04/2014 FINDINGS: Lower chest: Dense consolidation in the RIGHT lower lobe. There is masslike consolidation in the infrahilar RIGHT lung measuring 5.8 x 3.8 cm. There is a moderate RIGHT pleural effusion. There is some thickening in the distal esophagus is below the carina (image 66 series 2. Hepatobiliary: Low-attenuation along the falciform ligament likely represents fatty infiltration have image 31 series 2 postcholecystectomy. Pancreas: Pancreas is normal. No ductal dilatation. No pancreatic inflammation. Spleen: Normal spleen Adrenals/urinary tract: Adrenal glands and kidneys are normal. The ureters and bladder normal. Stomach/Bowel: Stomach, duodenum, cecum normal. The colon rectosigmoid colon normal. Vascular/Lymphatic: Abdominal aorta is normal caliber with atherosclerotic calcification. There is enlarged gastrohepatic ligament lymph node measuring 13 mm (image 31 series 2. Celiac lymph node measures 16 mm on image 38, series 2 ER aortocaval node measures 14 mm on image 54. Reproductive: Post hysterectomy Other: No peritoneal nodularity.  No free fluid Musculoskeletal: Posterior lumbar fusion. No aggressive osseous lesion. IMPRESSION: 1. Dense masslike RIGHT infrahilar consolidation is concerning for lung carcinoma. Pneumonia could have a similar appearance. Recommend CT thorax with contrast and consider bronchoscopy. 2. Diffuse thickening of the esophagus inferior to the carina. 3. Enlarged gastrohepatic ligament lymph nodes and periaortic adenopathy. 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 Electronically Signed   By: SSuzy BouchardM.D.   On: 08/21/2016 15:19   Dg Chest Portable 1 View  Result Date: 08/21/2016 CLINICAL DATA:  Status post right chest tube placement today. History of right lung adenocarcinoma. EXAM: PORTABLE CHEST 1 VIEW COMPARISON:  PA and lateral chest 07/22/2016 and CT chest 07/18/2016. FINDINGS: New right chest  tube is in place. Right pleural effusion is somewhat  decreased with some improvement in aeration in the right lung. Left lung remains clear. No pneumothorax. Cardiac silhouette is largely obscured. IMPRESSION: Decreased right pleural effusion with some improvement in aeration in the right chest after chest tube placement. Negative for pneumothorax. Electronically Signed   By: Inge Rise M.D.   On: 08/21/2016 14:18    EKG: Independently reviewed. Sinus tachycardia, leftward axis, no ischemic changes compared with ECG 1 month ago.  Assessment/Plan Principal Problem:   Symptomatic anemia Active Problems:   Adenocarcinoma of right lung, stage 3 (HCC)   Essential hypertension   RADIATION PNEUMONITIS   Coronary artery disease   GERD (gastroesophageal reflux disease)   Hyperlipidemia   COPD with hypoxia (H[facility])   DNR (do not resuscitate)   Melena   Acute post-hemorrhagic anemia   Antiplatelet or antithrombotic long-term use   GI bleeding: Presumed upper source, likely radiation-induced mucositis with esophageal inflammation seen on CT and h/o XRT. Ulcer and malignancy not ruled out. Dr. Ardis Hughs has performed EGD 2011, noting mild stricture.  - IV PPI BID - Antiemetics - Clear liquid diet tonight, NPO after midnight for EGD.   Acute symptomatic blood loss anemia with hypotension: Normocytic, hypochromic. Due to GI bleed above.  - Transfuse 2u PRBCs, started in ED - Repeat CBC in AM - Admit to SDU for hemodynamic monitoring (currently hypotensive and tachycardic) - With recent steroids for COPD exacerbation and current hypotension, will rule out adrenal insufficiency as contributing factor with cortisol level in AM.  Recurrent NSCLC adenocarcinoma: Stage IIIb followed by Dr. Julien Nordmann who was planning beginning immunotherapy with nivolumab today. Oct 2017 CT chest showed right upper lobe collapse and obstructive lesion with right pleural effusion, mediastinal and upper abdominal  lymphadenopathy.  - Sent from cancer center, Dr. Julien Nordmann added to treatment team.  - DO NOT RESUSCITATE: Patient active with outpatient hospice, recently stopped palliative radiation therapy due to side effects. Declined offer to reconsult palliative care team during this admission. - Home oxycodone continued prn pain (documented percocet allergy noted, not a true allergy) - Dense masslike consolidation in RLL noted on CT abd/pelvis concerning for malignancy vs. pneumonia without signs of pneumonia. Monitor closely for sxs of pneumonia and would require HCAP Tx.  Chronic hypoxemic respiratory failure with hypoxia: Due to progressive metastatic lung cancer, pleural effusion, and COPD with recent hospitalization for exacerbation.  - Effusion appears small, likely malignant, risks greater than benefits of thoracentesis at this time.  - Continue home bronchodilators - Continue oxygen 3-4L by nasal cannula and oximetry monitoring  CAD s/p PCI Feb 2017: Followed by Dr. Percival Spanish. Recent echocardiogram demonstrated a severely calcified MV, preserved EF and grade 2 DD. Appears volume down.  - Metoprolol held by PCP for hypotension and pressures remain soft, so will not order this at admission. ACE inhibitor discontinued recently due to hypotension as well.  - Hold aspirin and plavix - Continue IVF's and transfusions and monitor for volume overload. - Given progression of her metastatic disease, unclear benefit of statin medication at this time  Insulin-dependent T2DM: She's had declining A1c in the setting of cancer and weight loss/anorexia.  - Home insulin decreased by half with D/C mealtime insulin while NPO.  - Sensitive SSI q4h while NPO  History of constipation related to opioids:  - Bowel regimen to be resumed following GI work up.   DVT prophylaxis: SCDs  Code Status: DNR confirmed at admission  Family Communication: Daughter and granddaughter at bedside Disposition Plan: Admit to SDU  for  intensive hemodynamic monitoring of active GI bleeding in very frail cancer patient. Will require GI procedure.  Consults called: Gastroenterology, Dr. Ardis Hughs  Admission status: Inpatient   Vance Gather, MD [facility] Pager [phone]  If 7PM-7AM, please contact night-coverage [email or web address] Password [facility] 08/21/2016, 7:43 PM

## 2016-08-21 NOTE — Progress Notes (Addendum)
[place] OFFICE PROGRESS NOTE   DIAGNOSIS: Recurrent non-small cell lung cancer initially diagnosed as Stage IIIA (T2a N2 MX) non-small cell lung cancer, adenocarcinoma, diagnosed in October 2009. Chest CT 07/18/2016 with evidence of disease progression with extensive collapse/consolidation throughout the right lung, small loculated right pleural effusion and new/enlarging mediastinal/upper abdominal lymph nodes.  PRIOR THERAPY: :  1. Status post right upper lobectomy with lymph node dissection under the care of Dr. Arlyce Dice on October 12, 2008. 2. Status post 4 cycles of adjuvant chemotherapy with cisplatin and docetaxel given every 3 weeks with Neulasta support. Last dose was given on January 23, 2009. 3. Status post adjuvant radiotherapy to the mediastinum. The patient received a total dose of 5040 cGy between March 06, 2009, through April 07, 2009, under the care of Dr. Pablo Ledger. 4. Palliative radiotherapy to the obstructive right upper lobe lung mass under the care of Dr. Tammi Klippel. Patient discontinued radiation after 6 fractions.  CURRENT THERAPY: Nivolumab 240 mg IV every 2 weeks planned to begin 08/21/2016.   INTERVAL HISTORY:   [name] returns as scheduled. She is due to begin nivolumab today. She reports she discontinued the radiation due to burning upper abdominal pain. She noted onset of upper abdominal pain, dark stools and nausea/vomiting over the past 3 weeks. She is more short of breath than baseline. She feels "weak and tired". Appetite is poor.  Objective:  Vital signs in last 24 hours:  Blood pressure (!) 112/55, pulse (!) 128, temperature 97.8 F (36.6 C), temperature source Oral, resp. rate 16, height '5\' 2"'$  (1.575 m), weight 198 lb 6.4 oz (90 kg), SpO2 96 %.    HEENT: No thrush or ulcers. Mouth appears dry. Resp: Lungs are clear. Respiratory rate is increased. Cardio: Heart is regular, tachycardic. GI: Marked tenderness over the epigastric  region. Vascular: No leg edema. Neuro: Alert and oriented.  Skin: Pale appearing.    Lab Results:  Lab Results  Component Value Date   WBC 3.9 08/21/2016   HGB 6.6 (LL) 08/21/2016   HCT 22.7 (L) 08/21/2016   MCV 80.8 08/21/2016   PLT 140 (L) 08/21/2016   NEUTROABS 2.5 08/21/2016    Imaging:  No results found.  Medications: I have reviewed the patient's current medications.  Assessment/Plan: 1. Recurrent non-small cell lung cancer October 2017 with CT chest showing an obstructive lesion in the right upper lobe and collapse of the right upper lobe, enlarged mediastinal lymphadenopathy, right pleural effusion and upper abdominal lymphadenopathy. Scheduled to begin immunotherapy with nivolumab today. 2. Severe anemia with presenting symptoms concerning for a GI bleed   Disposition: [name] has recurrent non-small cell lung cancer. She was scheduled to begin immunotherapy with nivolumab today. She presents with severe symptomatic anemia with complaints of upper abdominal pain, nausea/vomiting and dark stools over the past 3 weeks. We are referring her to the emergency department for further evaluation.   Patient seen with Dr. Julien Nordmann.    Ned Card ANP/GNP-BC   08/21/2016  12:37 PM  ADDENDUM: Hematology/Oncology Attending: I had a face to face encounter with the patient. I recommended her care plan. This is a very pleasant 70 years old white female with recurrent non-small cell lung cancer with central obstructive lesion of the right upper lobe and collapse of the right upper lobe. The patient was undergoing palliative radiotherapy to this area. She was scheduled to start treatment with immunotherapy with Nivolumab today. She presented to the clinic with significant fatigue and weakness and her  CBC today showed severe anemia secondary to GI blood loss. I had a lengthy discussion with the patient and her daughter today about her condition. I strongly recommended for the  patient to go immediately to the emergency department for evaluation and admission for management of the gastrointestinal bleeding. We will delay the start of her treatment with immunotherapy until improvement of her condition and discharged from the hospital. She was advised to call immediately if she has any concerning symptoms in the interval. Disclaimer: This note was dictated with voice recognition software. Similar sounding words can inadvertently be transcribed and may be missed upon review. Eilleen Kempf., MD 08/24/16

## 2016-08-21 NOTE — ED Provider Notes (Signed)
[facility] DEPT Provider Note   CSN: [id number] Arrival date & time: 08/21/16  1326     History   Chief Complaint Chief Complaint  Patient presents with  . GI Bleeding  . Shortness of Breath    HPI [name] is a 70 y.o. female.  HPI  70 year old female with a history of lung cancer presents with anemia from her oncologist office. She was there for an appointment and her hemoglobin had dropped from 10 last month to 6.6 today. Has been having dark brown stool for a couple weeks. Has also been having abdominal pain from month as well as chronic nausea and vomiting. Is always short of breath and chronically on 3 L oxygen but she does feel like this is worsening. Has had worsening fatigue and weakness. To her knowledge she has never had an ulcer. She has had have an EGD due to GERD and strictures. Is on Plavix.  Past Medical History:  Diagnosis Date  . Adenocarcinoma of right lung, stage 3 ([place]) 08/28/2009      . Asthma   . Cholelithiasis   . Chronic bronchitis ([place])   . Colon polyps   . COPD (chronic obstructive pulmonary disease) ([facility])    USES O2 AT NIGHT + PRN IN DAYTIME3L  . Coronary artery disease    a. 2005 Cath/PCI: mRCA-> Cypher DES;  b. 06/2014 Cath/PCI: EF 60%. LM nl, LAD min irregs, D1 40, LCX 20p, OM2 95 (2.5x14 Resolute DES), RCA 30p, 30 ISR, 20d, PDA/PLA nl..  11/2015  Stenting of the entire RCA and LV branch off the RCA.    . Emphysema   . FH: chemotherapy 2010    had 4 times  . Fibromyalgia   . GERD (gastroesophageal reflux disease)   . H/O hiatal hernia   . Hyperlipidemia   . Hypertension   . Lung cancer ([place]) 2010   Adenocarcinoma, right lung, node positive  . On home oxygen therapy    "2-4L at night and prn" (07/18/2104)  . Oxygen dependent    at night  3liters  . Peripheral vascular disease ([place])   . Pneumonia    hx of   . Radiation 6/10   28 times right lung, and 5 treatments to sternum  . Stress incontinence   . Tobacco abuse   . Type  II diabetes mellitus [facility])     Patient Active Problem List   Diagnosis Date Noted  . Symptomatic anemia 08/21/2016  . DNR (do not resuscitate) 08/08/2016  . Encounter for antineoplastic immunotherapy 08/01/2016  . Malignant neoplasm of right lung ([place])   . Transient cerebral ischemia   . Chronic respiratory failure ([place]) 07/22/2016  . Collapse of right lung 07/22/2016  . Primary cancer of right upper lobe of lung ([place]) 07/22/2016  . Loculated pleural effusion 07/22/2016  . CAP (community acquired pneumonia) 06/17/2016  . Morbid obesity ([place]) 11/10/2015  . Coronary artery disease involving native coronary artery of native heart with unstable angina pectoris ([place])   . 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 Coronary artery disease   . GERD (gastroesophageal reflux disease)   . Hyperlipidemia   . COPD with hypoxia ([place])   . Hypertension   . Tobacco abuse 07/29/2010  . Adenocarcinoma of right lung, stage 3 ([place]) 08/28/2009  . RADIATION PNEUMONITIS 08/28/2009  . Type II diabetes mellitus ([place]) 07/30/2008  . Hyperlipidemia LDL goal <70 07/30/2008  . Essential hypertension 07/28/2008  . OXYGEN-USE OF SUPPLEMENTAL 07/28/2008  Past Surgical History:  Procedure Laterality Date  . ABDOMINAL HYSTERECTOMY  1972  . ABDOMINAL HYSTERECTOMY    . APPENDECTOMY  1970  . arm fracture Left 3/14  . BACK SURGERY  2007  . CARDIAC CATHETERIZATION N/A 11/09/2015   Procedure: Left Heart Cath and Coronary Angiography;  Surgeon: Belva Crome, MD;  Location: [facility] CV LAB;  Service: Cardiovascular;  Laterality: N/A;  . CARDIAC CATHETERIZATION Right 11/09/2015   Procedure: Intravascular Pressure Wire/FFR Study;  Surgeon: Belva Crome, MD;  Location: [facility] CV LAB;  Service: Cardiovascular;  Laterality: Right;  . CARDIAC CATHETERIZATION N/A 11/09/2015   Procedure: Coronary Stent Intervention;  Surgeon: Belva Crome, MD;  Location: [facility] LAB;  Service: Cardiovascular;   Laterality: N/A;  RCA  . CARPAL TUNNEL RELEASE    . CORONARY ANGIOPLASTY  2005  . EYE SURGERY     implants 02/18/12 (Left) 02/25/12 (right eye)  . FEMUR FRACTURE SURGERY Left    10/15  . FEMUR IM NAIL Left 07/19/2014   Procedure: INTRAMEDULLARY (IM) NAIL FEMORAL;  Surgeon: Alta Corning, MD;  Location: [facility];  Service: Orthopedics;  Laterality: Left;  . GALLBLADDER SURGERY  1989  . HARDWARE REMOVAL Left 07/19/2015   Procedure: HARDWARE REMOVAL;  Surgeon: Mcarthur Rossetti, MD;  Location: [facility];  Service: Orthopedics;  Laterality: Left;  . JOINT REPLACEMENT    . LEFT HEART CATHETERIZATION WITH CORONARY ANGIOGRAM N/A 06/20/2014   Procedure: LEFT HEART CATHETERIZATION WITH CORONARY ANGIOGRAM;  Surgeon: Troy Sine, MD;  Location: [facility] CATH LAB;  Service: Cardiovascular;  Laterality: N/A;  . NECK SURGERY  2005  . ORIF HUMERUS FRACTURE Left 12/14/2012   Procedure: OPEN REDUCTION INTERNAL FIXATION (ORIF) LEFT PROXIMAL HUMERUS FRACTURE;  Surgeon: Mcarthur Rossetti, MD;  Location: [facility];  Service: Orthopedics;  Laterality: Left;  . PNEUMONECTOMY     Right Partial, lung cancer confirmed, nod positive  . PORT-A-CATH REMOVAL  05/12/2012   Procedure: REMOVAL PORT-A-CATH;  Surgeon: Melrose Nakayama, MD;  Location: [facility];  Service: Thoracic;  Laterality: Left;  . PORTACATH PLACEMENT  12/12/2007  . REFRACTIVE SURGERY Bilateral 2/14  . REPLACEMENT TOTAL KNEE  2001   left  . Right Upper Lobectomy with lymph node dissection  10/12/2008   Dr Arlyce Dice  . ROTATOR CUFF REPAIR     RIGHT SHOULDER  2 YRS  . TONSILLECTOMY  1966  . TOTAL KNEE REVISION Left 07/19/2015   Procedure: Removal Intramedullary Rod and Screws Left Knee/Femur, Revision arthroplasty Left knee;  Surgeon: Mcarthur Rossetti, MD;  Location: [facility];  Service: Orthopedics;  Laterality: Left;    OB History    No data available       Home Medications    Prior to Admission medications   Medication Sig Start Date End Date  Taking? Authorizing Provider  aspirin EC 81 MG tablet Take 81 mg by mouth daily.    Yes Historical Provider, MD  Cholecalciferol (VITAMIN D) 1000 UNITS capsule Take 1,000 Units by mouth daily.    Yes Historical Provider, MD  clopidogrel (PLAVIX) 75 MG tablet Take 1 tablet (75 mg total) by mouth daily with breakfast. 11/10/15  Yes Einar Pheasant Hager, PA-C  fluticasone furoate-vilanterol (BREO ELLIPTA) 100-25 MCG/INH AEPB Inhale 1 puff into the lungs 2 (two) times daily.    Yes Historical Provider, MD  furosemide (LASIX) 20 MG tablet Take 1 tablet (20 mg total) by mouth daily as needed for edema. 06/20/16  Yes Kelvin Cellar,  MD  insulin detemir (LEVEMIR) 100 UNIT/ML injection Inject 45-55 Units into the skin 2 (two) times daily. Inject 55 units subcutaneously every morning and 45 units at bedtime   Yes Historical Provider, MD  lovastatin (MEVACOR) 40 MG tablet Take 20 mg by mouth daily after supper.    Yes Historical Provider, MD  oxyCODONE 10 MG TABS Take 1 tablet (10 mg total) by mouth every 4 (four) hours as needed for severe pain. 07/25/16  Yes Janece Canterbury, MD  Polyethyl Glycol-Propyl Glycol (SYSTANE) 0.4-0.3 % SOLN Place 1 drop into both eyes 2 (two) times daily.    Yes Historical Provider, MD  pregabalin (LYRICA) 50 MG capsule Take 50 mg by mouth 2 (two) times daily.    Yes Historical Provider, MD  promethazine (PHENERGAN) 25 MG tablet Take 25 mg by mouth every 6 (six) hours as needed for nausea or vomiting.   Yes Celene Squibb, MD  umeclidinium bromide (INCRUSE ELLIPTA) 62.5 MCG/INH AEPB Inhale 1 puff into the lungs 2 (two) times daily.   Yes Historical Provider, MD  acetaminophen (TYLENOL) 500 MG tablet Take 1,000 mg by mouth every 6 (six) hours as needed for mild pain.     Historical Provider, MD  albuterol (PROVENTIL) (2.5 MG/3ML) 0.083% nebulizer solution Take 3 mLs (2.5 mg total) by nebulization every 6 (six) hours as needed for wheezing or shortness of breath. 08/08/16   Rigoberto Noel, MD    bisacodyl (DULCOLAX) 10 MG suppository Place 1 suppository (10 mg total) rectally daily as needed for mild constipation or moderate constipation. 07/25/16   Janece Canterbury, MD  insulin aspart (NOVOLOG) 100 UNIT/ML injection Inject 15-25 Units into the skin as needed for high blood sugar. Per sliding scale: CBG <200 15 units, 200-260 20 units, >260 25 units    Historical Provider, MD  nitroGLYCERIN (NITROSTAT) 0.4 MG SL tablet Place 1 tablet (0.4 mg total) under the tongue every 5 (five) minutes x 3 doses as needed for chest pain. 06/21/14   Erlene Quan, PA-C  nystatin (MYCOSTATIN/NYSTOP) powder Apply topically 3 (three) times daily. 07/25/16   Janece Canterbury, MD  senna (SENOKOT) 8.6 MG TABS tablet Take 2 tablets (17.2 mg total) by mouth at bedtime. 07/25/16   Janece Canterbury, MD    Family History Family History  Problem Relation Age of Onset  . Diabetes Mother   . Heart failure Mother   . Diabetes Father   . CAD Father 67  . Arthritis    . Lung disease    . Cancer    . Asthma      Social History Social History  Substance Use Topics  . Smoking status: Former Smoker    Packs/day: 1.00    Years: 53.00    Types: Cigarettes    Quit date: 06/17/2016  . Smokeless tobacco: Never Used  . Alcohol use No     Allergies   Celebrex [celecoxib]; Niacin; Percocet [oxycodone-acetaminophen]; and Varenicline tartrate   Review of Systems Review of Systems  Constitutional: Positive for fatigue.  Respiratory: Positive for shortness of breath.   Gastrointestinal: Positive for abdominal pain, nausea and vomiting.  Neurological: Positive for weakness.  All other systems reviewed and are negative.    Physical Exam Updated Vital Signs BP 125/70   Pulse 111   Temp 98.2 F (36.8 C) (Oral)   Resp 11   SpO2 98%   Physical Exam  Constitutional: She is oriented to person, place, and time. She appears well-developed and well-nourished.  HENT:  Head: Normocephalic and atraumatic.  Right  Ear: External ear normal.  Left Ear: External ear normal.  Nose: Nose normal.  Eyes: Right eye exhibits no discharge. Left eye exhibits no discharge.  Cardiovascular: Regular rhythm and normal heart sounds.  Tachycardia present.   Pulmonary/Chest: Effort normal. She has wheezes (faint, expiratory).  Abdominal: Soft. There is tenderness (diffuse).  Neurological: She is alert and oriented to person, place, and time.  Skin: Skin is warm and dry. There is pallor.  Nursing note and vitals reviewed.    ED Treatments / Results  Labs (all labs ordered are listed, but only abnormal results are displayed) Labs Reviewed  COMPREHENSIVE METABOLIC PANEL - Abnormal; Notable for the following:       Result Value   Potassium 3.4 (*)    Chloride 100 (*)    Glucose, Bld 172 (*)    BUN 22 (*)    Total Protein 6.0 (*)    Albumin 3.0 (*)    All other components within normal limits  CBC - Abnormal; Notable for the following:    WBC 3.8 (*)    RBC 2.83 (*)    Hemoglobin 6.8 (*)    HCT 23.1 (*)    MCH 24.0 (*)    MCHC 29.4 (*)    RDW 17.7 (*)    All other components within normal limits  POC OCCULT BLOOD, ED - Abnormal; Notable for the following:    Fecal Occult Bld POSITIVE (*)    All other components within normal limits  PROTIME-INR  TYPE AND SCREEN  PREPARE RBC (CROSSMATCH)    EKG  EKG Interpretation None       Radiology Ct Abdomen Pelvis W Contrast  Result Date: 08/21/2016 CLINICAL DATA:  Upper abdominal pain, nausea and vomiting and dark stools EXAM: CT ABDOMEN AND PELVIS WITH CONTRAST TECHNIQUE: Multidetector CT imaging of the abdomen and pelvis was performed using the standard protocol following bolus administration of intravenous contrast. CONTRAST:  15m ISOVUE-300 IOPAMIDOL (ISOVUE-300) INJECTION 61% COMPARISON:  12/04/2014 FINDINGS: Lower chest: Dense consolidation in the RIGHT lower lobe. There is masslike consolidation in the infrahilar RIGHT lung measuring 5.8 x 3.8 cm.  There is a moderate RIGHT pleural effusion. There is some thickening in the distal esophagus is below the carina (image 66 series 2. Hepatobiliary: Low-attenuation along the falciform ligament likely represents fatty infiltration have image 31 series 2 postcholecystectomy. Pancreas: Pancreas is normal. No ductal dilatation. No pancreatic inflammation. Spleen: Normal spleen Adrenals/urinary tract: Adrenal glands and kidneys are normal. The ureters and bladder normal. Stomach/Bowel: Stomach, duodenum, cecum normal. The colon rectosigmoid colon normal. Vascular/Lymphatic: Abdominal aorta is normal caliber with atherosclerotic calcification. There is enlarged gastrohepatic ligament lymph node measuring 13 mm (image 31 series 2. Celiac lymph node measures 16 mm on image 38, series 2 ER aortocaval node measures 14 mm on image 54. Reproductive: Post hysterectomy Other: No peritoneal nodularity.  No free fluid Musculoskeletal: Posterior lumbar fusion. No aggressive osseous lesion. IMPRESSION: 1. Dense masslike RIGHT infrahilar consolidation is concerning for lung carcinoma. Pneumonia could have a similar appearance. Recommend CT thorax with contrast and consider bronchoscopy. 2. Diffuse thickening of the esophagus inferior to the carina. 3. Enlarged gastrohepatic ligament lymph nodes and periaortic adenopathy. 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 Electronically Signed   By: SSuzy BouchardM.D.   On: 08/21/2016 15:19   Dg Chest Portable 1 View  Result Date: 08/21/2016 CLINICAL DATA:  Status post right chest tube placement today. History  of right lung adenocarcinoma. EXAM: PORTABLE CHEST 1 VIEW COMPARISON:  PA and lateral chest 07/22/2016 and CT chest 07/18/2016. FINDINGS: New right chest tube is in place. Right pleural effusion is somewhat decreased with some improvement in aeration in the right lung. Left lung remains clear. No pneumothorax. Cardiac silhouette is largely  obscured. IMPRESSION: Decreased right pleural effusion with some improvement in aeration in the right chest after chest tube placement. Negative for pneumothorax. Electronically Signed   By: Inge Rise M.D.   On: 08/21/2016 14:18    Procedures Procedures (including critical care time) CRITICAL CARE Performed by: Sherwood Gambler T   Total critical care time: 30 minutes  Critical care time was exclusive of separately billable procedures and treating other patients.  Critical care was necessary to treat or prevent imminent or life-threatening deterioration.  Critical care was time spent personally by me on the following activities: development of treatment plan with patient and/or surrogate as well as nursing, discussions with consultants, evaluation of patient's response to treatment, examination of patient, obtaining history from patient or surrogate, ordering and performing treatments and interventions, ordering and review of laboratory studies, ordering and review of radiographic studies, pulse oximetry and re-evaluation of patient's condition.   Medications Ordered in ED Medications  0.9 %  sodium chloride infusion (not administered)  iopamidol (ISOVUE-300) 61 % injection (not administered)  sodium chloride 0.9 % injection (not administered)  promethazine (PHENERGAN) injection 12.5 mg (12.5 mg Intravenous Given 08/21/16 1417)  pantoprazole (PROTONIX) injection 40 mg (40 mg Intravenous Given 08/21/16 1506)  iopamidol (ISOVUE-300) 61 % injection 100 mL (100 mLs Intravenous Contrast Given 08/21/16 1447)     Initial Impression / Assessment and Plan / ED Course  I have reviewed the triage vital signs and the nursing notes.  Pertinent labs & imaging results that were available during my care of the patient were reviewed by me and considered in my medical decision making (see chart for details).  Clinical Course as of Aug 21 1708  Thu Aug 21, 2016  1359 Blood, CT scan, chest  portable  [SG]  1548 Dr. Bonner Puna to admit, waiting on GI consult.   [SG]  1624 D/w GI, they will come consult  [SG]    Clinical Course User Index [SG] Sherwood Gambler, MD    Patient's heart rate has slowly improved but is still tachycardic. Given her symptomatic anemia with a hemoglobin of 6.8 she will be given a blood transfusion. Her dark stools are concerning for upper GI bleed. She was given IV Protonix. CT as above does not show any acute perforation or colitis. Gastroesophageal thickening which could be cancer versus esophagitis. GI to consult and medicine will admit to the step down unit.  Final Clinical Impressions(s) / ED Diagnoses   Final diagnoses:  Symptomatic anemia    New Prescriptions New Prescriptions   No medications on file     Sherwood Gambler, MD 08/21/16 1710

## 2016-08-22 ENCOUNTER — Encounter (HOSPITAL_COMMUNITY): Payer: Self-pay

## 2016-08-22 ENCOUNTER — Encounter (HOSPITAL_COMMUNITY)
Admission: EM | Disposition: A | Discharge status: Home / Self Care | Payer: Self-pay | Source: Home / Self Care | Attending: Family Medicine

## 2016-08-22 ENCOUNTER — Inpatient Hospital Stay (HOSPITAL_COMMUNITY): Payer: Medicare Other | Admitting: Anesthesiology

## 2016-08-22 HISTORY — PX: ESOPHAGOGASTRODUODENOSCOPY: SHX5428

## 2016-08-22 LAB — BASIC METABOLIC PANEL
ANION GAP: 7 (ref 5–15)
BUN: 17 mg/dL (ref 6–20)
CALCIUM: 9.1 mg/dL (ref 8.9–10.3)
CO2: 31 mmol/L (ref 22–32)
Chloride: 104 mmol/L (ref 101–111)
Creatinine, Ser: 0.82 mg/dL (ref 0.44–1.00)
GLUCOSE: 116 mg/dL — AB (ref 65–99)
POTASSIUM: 4.2 mmol/L (ref 3.5–5.1)
SODIUM: 142 mmol/L (ref 135–145)

## 2016-08-22 LAB — GLUCOSE, CAPILLARY
GLUCOSE-CAPILLARY: 111 mg/dL — AB (ref 65–99)
GLUCOSE-CAPILLARY: 131 mg/dL — AB (ref 65–99)
GLUCOSE-CAPILLARY: 139 mg/dL — AB (ref 65–99)
Glucose-Capillary: 121 mg/dL — ABNORMAL HIGH (ref 65–99)
Glucose-Capillary: 162 mg/dL — ABNORMAL HIGH (ref 65–99)
Glucose-Capillary: 173 mg/dL — ABNORMAL HIGH (ref 65–99)
Glucose-Capillary: 65 mg/dL (ref 65–99)

## 2016-08-22 LAB — TYPE AND SCREEN
ABO/RH(D): B POS
Antibody Screen: NEGATIVE
UNIT DIVISION: 0
UNIT DIVISION: 0

## 2016-08-22 LAB — CBC
HCT: 29.9 % — ABNORMAL LOW (ref 36.0–46.0)
HEMOGLOBIN: 9.5 g/dL — AB (ref 12.0–15.0)
MCH: 25.7 pg — ABNORMAL LOW (ref 26.0–34.0)
MCHC: 31.8 g/dL (ref 30.0–36.0)
MCV: 81 fL (ref 78.0–100.0)
Platelets: 136 10*3/uL — ABNORMAL LOW (ref 150–400)
RBC: 3.69 MIL/uL — AB (ref 3.87–5.11)
RDW: 16.6 % — ABNORMAL HIGH (ref 11.5–15.5)
WBC: 4.1 10*3/uL (ref 4.0–10.5)

## 2016-08-22 LAB — HEMOGLOBIN AND HEMATOCRIT, BLOOD
HEMATOCRIT: 30.7 % — AB (ref 36.0–46.0)
HEMATOCRIT: 29.8 % — AB (ref 36.0–46.0)
HEMOGLOBIN: 9.5 g/dL — AB (ref 12.0–15.0)
Hemoglobin: 9.4 g/dL — ABNORMAL LOW (ref 12.0–15.0)

## 2016-08-22 LAB — CORTISOL-AM, BLOOD: CORTISOL - AM: 13.6 ug/dL (ref 6.7–22.6)

## 2016-08-22 SURGERY — EGD (ESOPHAGOGASTRODUODENOSCOPY)
Anesthesia: Monitor Anesthesia Care

## 2016-08-22 MED ORDER — PANTOPRAZOLE SODIUM 40 MG PO TBEC
40.0000 mg | DELAYED_RELEASE_TABLET | Freq: Every day | ORAL | Status: DC
  Administered 2016-08-23 – 2016-08-25 (×3): 40 mg via ORAL
  Filled 2016-08-22 (×6): qty 1

## 2016-08-22 MED ORDER — ESMOLOL HCL 100 MG/10ML IV SOLN
INTRAVENOUS | Status: AC
  Filled 2016-08-22: qty 10

## 2016-08-22 MED ORDER — ESMOLOL HCL 100 MG/10ML IV SOLN
INTRAVENOUS | Status: DC | PRN
  Administered 2016-08-22: 20 mg via INTRAVENOUS

## 2016-08-22 MED ORDER — BUTAMBEN-TETRACAINE-BENZOCAINE 2-2-14 % EX AERO
INHALATION_SPRAY | CUTANEOUS | Status: DC | PRN
  Administered 2016-08-22: 2 via TOPICAL

## 2016-08-22 MED ORDER — LACTATED RINGERS IV SOLN
INTRAVENOUS | Status: DC | PRN
  Administered 2016-08-22: 14:00:00 via INTRAVENOUS

## 2016-08-22 MED ORDER — KETAMINE HCL 10 MG/ML IJ SOLN
INTRAMUSCULAR | Status: AC
  Filled 2016-08-22: qty 1

## 2016-08-22 MED ORDER — NICOTINE 21 MG/24HR TD PT24
21.0000 mg | MEDICATED_PATCH | TRANSDERMAL | Status: DC
  Administered 2016-08-22 – 2016-08-24 (×3): 21 mg via TRANSDERMAL
  Filled 2016-08-22 (×3): qty 1

## 2016-08-22 MED ORDER — KETAMINE HCL 10 MG/ML IJ SOLN
INTRAMUSCULAR | Status: DC | PRN
  Administered 2016-08-22 (×4): 5 mg via INTRAVENOUS

## 2016-08-22 NOTE — Op Note (Signed)
[facility] Patient Name: [name] Procedure Date: 08/22/2016 MRN: [id number] Attending MD: Jerene Bears , MD Date of Birth: 16-Mar-1946 CSN: [id number] Age: 70 Admit Type: Inpatient Procedure:                Upper GI endoscopy Indications:              Epigastric abdominal pain, Acute post hemorrhagic                            anemia, Melena Providers:                Lajuan Lines. Hilarie Fredrickson, MD, Dortha Schwalbe RN, RN, Corliss Parish, Technician Referring MD:             [facility] Medicines:                Monitored Anesthesia Care Complications:            No immediate complications. Estimated Blood Loss:     Estimated blood loss: none. Procedure:                Pre-Anesthesia Assessment:                           - Prior to the procedure, a History and Physical                            was performed, and patient medications and                            allergies were reviewed. The patient's tolerance of                            previous anesthesia was also reviewed. The risks                            and benefits of the procedure and the sedation                            options and risks were discussed with the patient.                            All questions were answered, and informed consent                            was obtained. Prior Anticoagulants: The patient has                            taken Plavix (clopidogrel), last dose was 1 day                            prior to procedure. ASA Grade Assessment: IV - A  patient with severe systemic disease that is a                            constant threat to life. After reviewing the risks                            and benefits, the patient was deemed in                            satisfactory condition to undergo the procedure.                           After obtaining informed consent, the endoscope was   passed under direct vision. Throughout the                            procedure, the patient's blood pressure, pulse, and                            oxygen saturations were monitored continuously. The                            Endoscope was introduced through the mouth, and                            advanced to the second part of duodenum. The upper                            GI endoscopy was accomplished without difficulty.                            The patient tolerated the procedure well. Scope In: Scope Out: Findings:      Localized mild mucosal changes characterized by congestion/edema was       found in the distal esophagus and at the gastroesophageal junction       (likely radiation induced). No ulceration or evidence of tumor or       esophagitis.      The entire examined stomach was normal.      The cardia and gastric fundus were normal on retroflexion.      The examined duodenum was normal. Impression:               - Congested mucosa in the esophagus like radiation                            effect. No esophagitis, ulceration or bleeding.                           - Normal stomach. No evidence of gastritis,                            ulceration or bleeding.                           - Normal examined duodenum. No duodenitis,  ulceration or bleeding.                           - No specimens collected. Moderate Sedation:      N/A Recommendation:           - Return patient to hospital ward for ongoing care.                           - Advance diet as tolerated.                           - Continue present medications, but hold Plavix.                           - Monitor for recurrent melena, transfuse as                            necessary. No plans for further endoscopic                            procedures.                           - Defer decision to CT chest to primary team and                            oncology in further evaluation of  upper abd/lower                            chest pain.                           - Would recommend daily PPI and avoidance of NSAIDs. Procedure Code(s):        --- Professional ---                           [phone], Esophagogastroduodenoscopy, flexible,                            transoral; diagnostic, including collection of                            specimen(s) by brushing or washing, when performed                            (separate procedure) Diagnosis Code(s):        --- Professional ---                           K22.8, Other specified diseases of esophagus                           R10.13, Epigastric pain                           D62, Acute posthemorrhagic anemia  K92.1, Melena (includes Hematochezia) CPT copyright 2016 [employer]. All rights reserved. The codes documented in this report are preliminary and upon coder review may  be revised to meet current compliance requirements. Jerene Bears, MD 08/22/2016 2:54:43 PM This report has been signed electronically. Number of Addenda: 0

## 2016-08-22 NOTE — Care Management Note (Signed)
Case Management Note  Patient Details  Name: [name] MRN: [id number] Date of Birth: [birth year]  Subjective/Objective:    Gi Bleed with hypotension                Action/Plan:  From home Date:  August 22, 2016 Chart reviewed for concurrent status and case management needs. Will continue to follow patient progress. Discharge Planning: following for needs Expected discharge date: [id number] Velva Harman, BSN, [place], [place]   Expected Discharge Date:   (unknown)               Expected Discharge Plan:  Home/Self Care  In-House Referral:     Discharge planning Services     Post Acute Care Choice:    Choice offered to:     DME Arranged:    DME Agency:     HH Arranged:    HH Agency:     Status of Service:  In process, will continue to follow  If discussed at Long Length of Stay Meetings, dates discussed:    Additional Comments:  Leeroy Cha, RN 08/22/2016, 7:34 AM

## 2016-08-22 NOTE — Progress Notes (Signed)
Initial Nutrition Assessment  DOCUMENTATION CODES:   Not applicable  INTERVENTION:  - Diet advancement as medically feasible. - RD will follow-up 11/19.  NUTRITION DIAGNOSIS:   Inadequate oral intake related to inability to eat as evidenced by NPO status.  GOAL:   Patient will meet greater than or equal to 90% of their needs  MONITOR:   Diet advancement, Weight trends, Labs, I & O's  REASON FOR ASSESSMENT:   Malnutrition Screening Tool  ASSESSMENT:   70 y.o. female with a history of NSCLC s/p lobectomy, adjuvant chemo, postadjuvant XRT and most recently palliative radiation who presented to the [facility] cancer center complaining of dyspnea, weakness, poor appetite, and dark stools. She was found to be anemic (hgb 6.6), sent to the ED and is being admitted for active GI bleed causing acute on chronic anemia. She endorses 3 weeks of nausea worsening from her baseline with any movement or anything po, decreased po, frequent dysphagia/odynophagia solids > liquids. Emesis nearly daily without hematemesis, but has noted darkening of brown formed stool during this time associated with fatigue, weakness.   Pt seen for MST. BMI indicates obesity. Pt has been NPO since 0500 for EGD today and is currently in Endo for the same. She was on CLD from 1722 yesterday-0500 today with no documented intakes during that time frame. All information obtained from the chart at time time as pt out of room for procedure. Information from H&P outlined above. Per GI PA note this AM, pt has been having "ongoing burning epigastric pain."   Will perform physical assessment at follow-up and document finding at that time. Per chart review, pt has lost 7 lbs (3.4% body weight) in the past 1 month which is not significant for time frame.   Medications reviewed; PRN Dulcolax, sliding scale Novolog, 25 units Levemir BID, PRN IV Zofran, 40 mg IV Protonix BID, 2 tablets Senokot/day. Labs reviewed; CBGs: 111-131  mg/dL.  IVF: D5-NS @ 100 mL/hr (408 kcal).     Diet Order:  Diet NPO time specified  Skin:  Reviewed, no issues  Last BM:  11/17  Height:   Ht Readings from Last 1 Encounters:  08/22/16 '5\' 1"'$  (1.549 m)    Weight:   Wt Readings from Last 1 Encounters:  08/22/16 198 lb 10.2 oz (90.1 kg)    Ideal Body Weight:  47.73 kg  BMI:  Body mass index is 37.53 kg/m.  Estimated Nutritional Needs:   Kcal:  2075-2250 (23-25 kcal/kg)  Protein:  72-90 grams (0.8-1 grams/kg)  Fluid:  2 L/day  EDUCATION NEEDS:   No education needs identified at this time    Jarome Matin, MS, RD, LDN Inpatient Clinical Dietitian Pager # [phone] After hours/weekend pager # [phone]

## 2016-08-22 NOTE — Progress Notes (Signed)
PROGRESS NOTE  [name]  YJE:[id number] DOB: September 19, 1946 DOA: 08/21/2016 PCP: [name], MD  Outpatient Specialists: - GI Dr. Ardis Hughs - Oncology Dr. Julien Nordmann  Brief Narrative: [name] is a 70 y.o. female with a history of NSCLC s/p lobectomy, adjuvant chemo, postadjuvant XRT and most recently palliative radiation who presented to the [facility] cancer center complaining of dyspnea, weakness, poor appetite, and dark stools. She was found to be anemic (hgb 6.6), sent to the ED and admitted for further work up. EGD 11/17 showed esophageal edema consistent with h/o radiation, without esophagitis, gastritis, or duodenitis. Transfusion of 2u PRBCs improved hgb to 9.5, which has remained stable.  Assessment & Plan: Principal Problem:   Symptomatic anemia Active Problems:   Adenocarcinoma of right lung, stage 3 (HCC)   Essential hypertension   RADIATION PNEUMONITIS   Coronary artery disease   GERD (gastroesophageal reflux disease)   Hyperlipidemia   COPD with hypoxia (HCC)   DNR (do not resuscitate)   Melena   Acute post-hemorrhagic anemia   Antiplatelet or antithrombotic long-term use  GI bleeding: Presumed upper source, likely radiation-induced. EGD 11/17 showed no obvious bleeding source. No further endoscopy recommended. - Antiemetics - Clear liquid diet as tolerated - Hold plavix per GI - Monitor for recurrent melena - Restart daily po PPI  Acute symptomatic blood loss anemia with hypotension: Normocytic, hypochromic. Due to GI bleed above. Hgb 6.6 > 9.5 s/p 2u PRBCs 11/16 - Repeat CBC in AM - Stay in SDU for hemodynamic monitoring.   Recurrent NSCLC adenocarcinoma: Stage IIIb followed by Dr. Julien Nordmann who was planning beginning immunotherapy with nivolumab today. Oct 2017 CT chest showed right upper lobe collapse and obstructive lesion with right pleural effusion, mediastinal and upper abdominal lymphadenopathy.  - Sent from cancer center, Dr. Julien Nordmann added to treatment team.    - DO NOT RESUSCITATE: Patient active with outpatient hospice, recently stopped palliative radiation therapy due to side effects. Declined offer to reconsult palliative care team during this admission. - Home oxycodone continued prn pain (documented percocet allergy noted, not a true allergy) - Dense masslike consolidation in RLL noted on CT abd/pelvis concerning for malignancy vs. pneumonia without signs of pneumonia. Monitor closely for sxs of pneumonia and would require HCAP Tx. - Reports dyspnea back at baseline after transfusions this AM, so will defer CT chest/HCAP tx.   Chronic hypoxemic respiratory failure with hypoxia: Due to progressive metastatic lung cancer, pleural effusion, and COPD with recent hospitalization for exacerbation.  - Effusion appears small, likely malignant, risks greater than benefits of thoracentesis at this time.  - Continue homebronchodilators - Continue oxygen by nasal cannula to maintain saturations 88-92% and oximetry monitoring  CAD s/p PCI Feb 2017: Followed by Dr. Percival Spanish. Recent echocardiogram demonstrated a severely calcified MV, preserved EF and grade 2 DD. Appears volume down.  - Metoprolol held by PCP for hypotension and pressures remain soft, so will not order this at admission. ACE inhibitor discontinued recently due to hypotension as well.  - Hold plavix, restart aspirin per GI - Given progression of her metastatic disease, unclear benefit of statin medication at this time Tachycardia is not new: HR 117 at office visit 11/3. Consider restarting cardioselective beta blocker if BP maintains.   Insulin-dependent T2DM: She's had declining A1c in the setting of cancer and weight loss/anorexia.  - Home insulin decreased by half with D/C mealtime insulin while NPO.  - Sensitive SSI q4h while NPO  History of constipation related to opioids:  -  Bowel regimen to be resumed following GI work up.   DVT prophylaxis: SCD Code Status: DNR Family  Communication: Daughter called but no answer this AM Disposition Plan: Ongoing monitoring. If no recurrent melena noted, may discharge in next 24-48 hours.  Consultants:   GI, Dr. Ardis Hughs  Procedures:   EGD 11/17  Antimicrobials:  None   Subjective: Patient feels at breathing baseline currently. No chest pain. Better generally after transfusion.   Objective: Vitals:   08/22/16 1446 08/22/16 1500 08/22/16 1553 08/22/16 1631  BP: (!) 196/89 (!) 159/71 130/61   Pulse: (!) 121 (!) 116 (!) 115 (!) 114  Resp: (!) 24  (!) 34 (!) 30  Temp: 98.5 F (36.9 C)   98.1 F (36.7 C)  TempSrc: Oral   Oral  SpO2: 91% 90% 90% 93%  Weight:      Height:        Intake/Output Summary (Last 24 hours) at 08/22/16 1638 Last data filed at 08/22/16 1440  Gross per 24 hour  Intake          2073.33 ml  Output              226 ml  Net          1847.33 ml   Filed Weights   08/21/16 1715 08/22/16 1343  Weight: 90.1 kg (198 lb 10.2 oz) 90.1 kg (198 lb 10.2 oz)    Examination: Constitutional: 70 y.o. female in no distress, calm demeanor Eyes: Lids and conjunctival pallor, PERRL ENMT: Mucous membranes are tacky. Posterior pharynx clear of any exudate or lesions.  Neck: normal, supple, no masses, no thyromegaly Respiratory: Non-labored breathing 3L O2 without accessory muscle use. Reduced bibasilar R > L breath sounds that are clear without crackles or wheezes. Cardiovascular: Tachycardic rate, regular rhythm, no murmurs, rubs, or gallops. No carotid bruits. No JVD. No LE edema. 1+ pedal pulses. Abdomen: Normoactive bowel sounds. Diffusely tender without distention, rebound or guarding. No hepatosplenomegaly. GU: No indwelling catheter Musculoskeletal: Mild clubbing, no cyanosis. No joint deformity upper and lower extremities. Good ROM, no contractures. Normal muscle tone.  Skin: Warm, dry, diffuse pallor without rashes/wounds or extensive bruising.  Neurologic: CN II-XII grossly intact. Gait not  assessed. Speech normal. No focal deficits in motor strength or sensation in all extremities.  Psychiatric: Alert and oriented x3. Normal judgment and insight. Mood euthymic with appropriate affect.   Data Reviewed: I have personally reviewed following labs and imaging studies  CBC:  Recent Labs Lab 08/21/16 1147 08/21/16 1343 08/21/16 2151 08/22/16 0611 08/22/16 1120  WBC 3.9 3.8*  --  4.1  --   NEUTROABS 2.5  --   --   --   --   HGB 6.6* 6.8* 9.0* 9.5* 9.5*  HCT 22.7* 23.1* 28.4* 29.9* 30.7*  MCV 80.8 81.6  --  81.0  --   PLT 140* 159  --  136*  --    Basic Metabolic Panel:  Recent Labs Lab 08/21/16 1147 08/21/16 1343 08/22/16 0611  NA 143 139 142  K 3.8 3.4* 4.2  CL  --  100* 104  CO2 29 32 31  GLUCOSE 172* 172* 116*  BUN 19.4 22* 17  CREATININE 0.8 0.84 0.82  CALCIUM 9.7 9.4 9.1   GFR: Estimated Creatinine Clearance: 65.2 mL/min (by C-G formula based on SCr of 0.82 mg/dL). Liver Function Tests:  Recent Labs Lab 08/21/16 1147 08/21/16 1343  AST 34 39  ALT 14 16  ALKPHOS 88 75  BILITOT  0.31 0.6  PROT 5.7* 6.0*  ALBUMIN 2.4* 3.0*   No results for input(s): LIPASE, AMYLASE in the last 168 hours. No results for input(s): AMMONIA in the last 168 hours. Coagulation Profile:  Recent Labs Lab 08/21/16 1343  INR 1.09   Cardiac Enzymes: No results for input(s): CKTOTAL, CKMB, CKMBINDEX, TROPONINI in the last 168 hours. BNP (last 3 results) No results for input(s): PROBNP in the last 8760 hours. HbA1C: No results for input(s): HGBA1C in the last 72 hours. CBG:  Recent Labs Lab 08/22/16 0047 08/22/16 0356 08/22/16 0750 08/22/16 1155 08/22/16 1634  GLUCAP 111* 131* 121* 162* 139*   Lipid Profile: No results for input(s): CHOL, HDL, LDLCALC, TRIG, CHOLHDL, LDLDIRECT in the last 72 hours. Thyroid Function Tests:  Recent Labs  08/21/16 1147  TSH 2.039   Anemia Panel: No results for input(s): VITAMINB12, FOLATE, FERRITIN, TIBC, IRON,  RETICCTPCT in the last 72 hours. Urine analysis:    Component Value Date/Time   COLORURINE YELLOW 06/17/2016 1023   APPEARANCEUR CLEAR 06/17/2016 1023   LABSPEC 1.020 06/17/2016 1023   PHURINE 5.5 06/17/2016 1023   GLUCOSEU >1000 (A) 06/17/2016 1023   HGBUR NEGATIVE 06/17/2016 1023   BILIRUBINUR NEGATIVE 06/17/2016 1023   KETONESUR NEGATIVE 06/17/2016 1023   PROTEINUR NEGATIVE 06/17/2016 1023   UROBILINOGEN 0.2 06/07/2015 1122   NITRITE POSITIVE (A) 06/17/2016 1023   LEUKOCYTESUR NEGATIVE 06/17/2016 1023   Sepsis Labs: '@LABRCNTIP'$ (procalcitonin:4,lacticidven:4)  ) Recent Results (from the past 240 hour(s))  TECHNOLOGIST REVIEW     Status: None   Collection Time: 08/21/16 11:47 AM  Result Value Ref Range Status   Technologist Review occ Metas and Myelocytes present  Final  MRSA PCR Screening     Status: Abnormal   Collection Time: 08/21/16  5:43 PM  Result Value Ref Range Status   MRSA by PCR POSITIVE (A) NEGATIVE Final    Comment:        The GeneXpert MRSA Assay (FDA approved for NASAL specimens only), is one component of a comprehensive MRSA colonization surveillance program. It is not intended to diagnose MRSA infection nor to guide or monitor treatment for MRSA infections. RESULT CALLED TO, READ BACK BY AND VERIFIED WITH: [place] RN AT 1921 ON 08/21/16 BY S.VANHOORNE      Radiology Studies: Ct Abdomen Pelvis W Contrast  Result Date: 08/21/2016 CLINICAL DATA:  Upper abdominal pain, nausea and vomiting and dark stools EXAM: CT ABDOMEN AND PELVIS WITH CONTRAST TECHNIQUE: Multidetector CT imaging of the abdomen and pelvis was performed using the standard protocol following bolus administration of intravenous contrast. CONTRAST:  1104m ISOVUE-300 IOPAMIDOL (ISOVUE-300) INJECTION 61% COMPARISON:  12/04/2014 FINDINGS: Lower chest: Dense consolidation in the RIGHT lower lobe. There is masslike consolidation in the infrahilar RIGHT lung measuring 5.8 x 3.8 cm. There is a  moderate RIGHT pleural effusion. There is some thickening in the distal esophagus is below the carina (image 66 series 2. Hepatobiliary: Low-attenuation along the falciform ligament likely represents fatty infiltration have image 31 series 2 postcholecystectomy. Pancreas: Pancreas is normal. No ductal dilatation. No pancreatic inflammation. Spleen: Normal spleen Adrenals/urinary tract: Adrenal glands and kidneys are normal. The ureters and bladder normal. Stomach/Bowel: Stomach, duodenum, cecum normal. The colon rectosigmoid colon normal. Vascular/Lymphatic: Abdominal aorta is normal caliber with atherosclerotic calcification. There is enlarged gastrohepatic ligament lymph node measuring 13 mm (image 31 series 2. Celiac lymph node measures 16 mm on image 38, series 2 ER aortocaval node measures 14 mm on image 54. Reproductive: Post hysterectomy Other:  No peritoneal nodularity.  No free fluid Musculoskeletal: Posterior lumbar fusion. No aggressive osseous lesion. IMPRESSION: 1. Dense masslike RIGHT infrahilar consolidation is concerning for lung carcinoma. Pneumonia could have a similar appearance. Recommend CT thorax with contrast and consider bronchoscopy. 2. Diffuse thickening of the esophagus inferior to the carina. 3. Enlarged gastrohepatic ligament lymph nodes and periaortic adenopathy. 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 Electronically Signed   By: Suzy Bouchard M.D.   On: 08/21/2016 15:19   Dg Chest Portable 1 View  Addendum Date: 08/22/2016   ADDENDUM REPORT: 08/22/2016 09:53 ADDENDUM: This addendum is given for the purpose of noting that was thought to represent a chest tube on the prior examination is a monitor lead. The report is otherwise unchanged. Electronically Signed   By: Inge Rise M.D.   On: 08/22/2016 09:53   Result Date: 08/22/2016 CLINICAL DATA:  Status post right chest tube placement today. History of right lung adenocarcinoma. EXAM:  PORTABLE CHEST 1 VIEW COMPARISON:  PA and lateral chest 07/22/2016 and CT chest 07/18/2016. FINDINGS: New right chest tube is in place. Right pleural effusion is somewhat decreased with some improvement in aeration in the right lung. Left lung remains clear. No pneumothorax. Cardiac silhouette is largely obscured. IMPRESSION: Decreased right pleural effusion with some improvement in aeration in the right chest after chest tube placement. Negative for pneumothorax. Electronically Signed: By: Inge Rise M.D. On: 08/21/2016 14:18    Scheduled Meds: . sodium chloride   Intravenous Once  . fluticasone furoate-vilanterol  1 puff Inhalation Daily  . insulin aspart  0-9 Units Subcutaneous Q4H  . insulin detemir  25 Units Subcutaneous BID  . [START ON 08/23/2016] pantoprazole  40 mg Oral Q0600  . polyvinyl alcohol  1 drop Both Eyes BID  . pregabalin  50 mg Oral BID  . senna  2 tablet Oral QHS  . sodium chloride flush  3 mL Intravenous Q12H  . umeclidinium bromide  1 puff Inhalation Daily   Continuous Infusions: . dextrose 5 % and 0.9 % NaCl with KCl 20 mEq/L 100 mL/hr at 08/22/16 1136     LOS: 1 day   Time spent: 25 minutes.  Vance Gather, MD [facility] Pager [phone]  If 7PM-7AM, please contact night-coverage [email or web address] Password [facility] 08/22/2016, 4:38 PM

## 2016-08-22 NOTE — Anesthesia Preprocedure Evaluation (Addendum)
Anesthesia Evaluation  Patient identified by MRN, date of birth, ID band Patient awake    Reviewed: Allergy & Precautions, H&P , NPO status , Patient's Chart, lab work & pertinent test results  Airway Mallampati: III  TM Distance: >3 FB Neck ROM: Full    Dental no notable dental hx. (+) Upper Dentures, Lower Dentures, Dental Advisory Given   Pulmonary asthma , COPD,  COPD inhaler and oxygen dependent, former smoker,  Lung CA   Pulmonary exam normal  + decreased breath sounds      Cardiovascular hypertension, Pt. on medications + CAD, + Cardiac Stents and + Peripheral Vascular Disease   Rhythm:Regular Rate:Tachycardia     Neuro/Psych negative neurological ROS  negative psych ROS   GI/Hepatic Neg liver ROS, hiatal hernia, GERD  Medicated and Controlled,  Endo/Other  diabetes, Insulin Dependent  Renal/GU negative Renal ROS  negative genitourinary   Musculoskeletal  (+) Fibromyalgia -  Abdominal   Peds  Hematology negative hematology ROS (+) anemia ,   Anesthesia Other Findings   Reproductive/Obstetrics negative OB ROS                            Anesthesia Physical Anesthesia Plan  ASA: IV  Anesthesia Plan: MAC   Post-op Pain Management:    Induction: Intravenous  Airway Management Planned: Nasal Cannula  Additional Equipment:   Intra-op Plan:   Post-operative Plan:   Informed Consent: I have reviewed the patients History and Physical, chart, labs and discussed the procedure including the risks, benefits and alternatives for the proposed anesthesia with the patient or authorized representative who has indicated his/her understanding and acceptance.   Dental advisory given  Plan Discussed with: CRNA  Anesthesia Plan Comments:        Anesthesia Quick Evaluation

## 2016-08-22 NOTE — Transfer of Care (Signed)
Immediate Anesthesia Transfer of Care Note  Patient: [name]  Procedure(s) Performed: Procedure(s): ESOPHAGOGASTRODUODENOSCOPY (EGD) (N/A)  Patient Location: PACU  Anesthesia Type:MAC  Level of Consciousness: awake, alert  and oriented  Airway & Oxygen Therapy: Patient Spontanous Breathing and Patient connected to nasal cannula oxygen  Post-op Assessment: Report given to RN and Post -op Vital signs reviewed and stable  Post vital signs: Reviewed and stable  Last Vitals:  Vitals:   08/22/16 1139 08/22/16 1343  BP: (!) 147/76 (!) 169/74  Pulse: (!) 110 (!) 117  Resp: (!) 24 (!) 24  Temp:  37.1 C    Last Pain:  Vitals:   08/22/16 1343  TempSrc: Oral  PainSc:       Patients Stated Pain Goal: 3 (14/70/92 9574)  Complications: No apparent anesthesia complications

## 2016-08-22 NOTE — Anesthesia Postprocedure Evaluation (Signed)
Anesthesia Post Note  Patient: [name]  Procedure(s) Performed: Procedure(s) (LRB): ESOPHAGOGASTRODUODENOSCOPY (EGD) (N/A)  Patient location during evaluation: PACU Anesthesia Type: MAC Level of consciousness: awake and alert Pain management: pain level controlled Vital Signs Assessment: post-procedure vital signs reviewed and stable Respiratory status: spontaneous breathing, nonlabored ventilation, respiratory function stable and patient connected to nasal cannula oxygen Cardiovascular status: stable and blood pressure returned to baseline Anesthetic complications: no    Last Vitals:  Vitals:   08/22/16 1343 08/22/16 1446  BP: (!) 169/74 (!) 196/89  Pulse: (!) 117 (!) 121  Resp: (!) 24 (!) 24  Temp: 37.1 C 36.9 C    Last Pain:  Vitals:   08/22/16 1446  TempSrc: Oral  PainSc:                  Kacin Dancy,W. EDMOND

## 2016-08-22 NOTE — Progress Notes (Signed)
Patient ID: [name], female   DOB: [birth year], 70 y.o.   MRN: [id number]    Progress Note   Subjective   CC - weakness , melena , epigastric pain  One dark stool this am, having ongoing burning epigastric pain. Tolerated blood transfusions, no CP, has mild SOB over her baseline  HGB up to 9.5    Objective   Vital signs in last 24 hours: Temp:  [97.6 F (36.4 C)-99 F (37.2 C)] 98 F (36.7 C) (11/17 0400) Pulse Rate:  [94-132] 106 (11/17 0800) Resp:  [0-27] 22 (11/17 0800) BP: (89-164)/(48-105) 140/72 (11/17 0800) SpO2:  [93 %-99 %] 97 % (11/17 0800) Weight:  [198 lb 6.4 oz (90 kg)-198 lb 10.2 oz (90.1 kg)] 198 lb 10.2 oz (90.1 kg) (11/16 1715) Last BM Date: 08/20/16 General:  Elderly   white female in NAD Heart:  Regular rate and rhythm; no murmurs Lungs: Respirations even and unlabored,decreased BS bilat, Abdomen:  Soft, tender   Across upper abdomen and nondistended. Normal bowel sounds. Extremities:  Without edema. Neurologic:  Alert and oriented,  grossly normal neurologically. Psych:  Cooperative. Normal mood and affect.  Intake/Output from previous day: 11/16 0701 - 11/17 0700 In: 1915 [P.O.:100; I.V.:1000; Blood:815] Out: 225 [Urine:225] Intake/Output this shift: Total I/O In: 558.3 [I.V.:558.3] Out: -   Lab Results:  Recent Labs  08/21/16 1147 08/21/16 1343 08/21/16 2151 08/22/16 0611  WBC 3.9 3.8*  --  4.1  HGB 6.6* 6.8* 9.0* 9.5*  HCT 22.7* 23.1* 28.4* 29.9*  PLT 140* 159  --  136*   BMET  Recent Labs  08/21/16 1147 08/21/16 1343 08/22/16 0611  NA 143 139 142  K 3.8 3.4* 4.2  CL  --  100* 104  CO2 29 32 31  GLUCOSE 172* 172* 116*  BUN 19.4 22* 17  CREATININE 0.8 0.84 0.82  CALCIUM 9.7 9.4 9.1   LFT  Recent Labs  08/21/16 1343  PROT 6.0*  ALBUMIN 3.0*  AST 39  ALT 16  ALKPHOS 75  BILITOT 0.6   PT/INR  Recent Labs  08/21/16 1343  LABPROT 14.2  INR 1.09    Studies/Results: Ct Abdomen Pelvis W Contrast  Result  Date: 08/21/2016 CLINICAL DATA:  Upper abdominal pain, nausea and vomiting and dark stools EXAM: CT ABDOMEN AND PELVIS WITH CONTRAST TECHNIQUE: Multidetector CT imaging of the abdomen and pelvis was performed using the standard protocol following bolus administration of intravenous contrast. CONTRAST:  169m ISOVUE-300 IOPAMIDOL (ISOVUE-300) INJECTION 61% COMPARISON:  12/04/2014 FINDINGS: Lower chest: Dense consolidation in the RIGHT lower lobe. There is masslike consolidation in the infrahilar RIGHT lung measuring 5.8 x 3.8 cm. There is a moderate RIGHT pleural effusion. There is some thickening in the distal esophagus is below the carina (image 66 series 2. Hepatobiliary: Low-attenuation along the falciform ligament likely represents fatty infiltration have image 31 series 2 postcholecystectomy. Pancreas: Pancreas is normal. No ductal dilatation. No pancreatic inflammation. Spleen: Normal spleen Adrenals/urinary tract: Adrenal glands and kidneys are normal. The ureters and bladder normal. Stomach/Bowel: Stomach, duodenum, cecum normal. The colon rectosigmoid colon normal. Vascular/Lymphatic: Abdominal aorta is normal caliber with atherosclerotic calcification. There is enlarged gastrohepatic ligament lymph node measuring 13 mm (image 31 series 2. Celiac lymph node measures 16 mm on image 38, series 2 ER aortocaval node measures 14 mm on image 54. Reproductive: Post hysterectomy Other: No peritoneal nodularity.  No free fluid Musculoskeletal: Posterior lumbar fusion. No aggressive osseous lesion. IMPRESSION: 1. Dense masslike RIGHT infrahilar consolidation  is concerning for lung carcinoma. Pneumonia could have a similar appearance. Recommend CT thorax with contrast and consider bronchoscopy. 2. Diffuse thickening of the esophagus inferior to the carina. 3. Enlarged gastrohepatic ligament lymph nodes and periaortic adenopathy. 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  Electronically Signed   By: Suzy Bouchard M.D.   On: 08/21/2016 15:19   Dg Chest Portable 1 View  Result Date: 08/21/2016 CLINICAL DATA:  Status post right chest tube placement today. History of right lung adenocarcinoma. EXAM: PORTABLE CHEST 1 VIEW COMPARISON:  PA and lateral chest 07/22/2016 and CT chest 07/18/2016. FINDINGS: New right chest tube is in place. Right pleural effusion is somewhat decreased with some improvement in aeration in the right lung. Left lung remains clear. No pneumothorax. Cardiac silhouette is largely obscured. IMPRESSION: Decreased right pleural effusion with some improvement in aeration in the right chest after chest tube placement. Negative for pneumothorax. Electronically Signed   By: Inge Rise M.D.   On: 08/21/2016 14:18       Assessment / Plan:    #1 70 yo female with recurrent nonsmall cell lung CA with extensive involvement of R lung , presenting with melena x 3 weeks , burning subxyphoid./epigastric pain - stopped radiation about 2 weeks ago because of burning pain. 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 #5 anemia secondary to GI blood loss - stable post transfusions   Plan; Continue IV Protonix BID  EGD today  Continue serial hgbs and transfuse to keep hgb 8 Hold ASA and Plavix  Principal Problem:   Symptomatic anemia Active Problems:   Adenocarcinoma of right lung, stage 3 (HCC)   Essential hypertension   RADIATION PNEUMONITIS   Coronary artery disease   GERD (gastroesophageal reflux disease)   Hyperlipidemia   COPD with hypoxia ([place])   DNR (do not resuscitate)   Melena   Acute post-hemorrhagic anemia   Antiplatelet or antithrombotic long-term use     LOS: 1 day   [name]  08/22/2016, 9:48 AM

## 2016-08-22 NOTE — Progress Notes (Signed)
[facility]  Patient Status: Active (receiving services up to time of hospitalization)  [facility] is providing the following services: RN  If patient discharges after hours, please call [phone].   [name] 08/22/2016, 9:18 AM

## 2016-08-23 ENCOUNTER — Encounter (HOSPITAL_COMMUNITY): Payer: Self-pay | Admitting: Radiology

## 2016-08-23 ENCOUNTER — Inpatient Hospital Stay (HOSPITAL_COMMUNITY): Payer: Medicare Other

## 2016-08-23 LAB — CBC
HCT: 31.1 % — ABNORMAL LOW (ref 36.0–46.0)
Hemoglobin: 9.6 g/dL — ABNORMAL LOW (ref 12.0–15.0)
MCH: 25.9 pg — AB (ref 26.0–34.0)
MCHC: 30.9 g/dL (ref 30.0–36.0)
MCV: 84.1 fL (ref 78.0–100.0)
PLATELETS: 151 10*3/uL (ref 150–400)
RBC: 3.7 MIL/uL — AB (ref 3.87–5.11)
RDW: 17.6 % — AB (ref 11.5–15.5)
WBC: 4.7 10*3/uL (ref 4.0–10.5)

## 2016-08-23 LAB — GLUCOSE, CAPILLARY
GLUCOSE-CAPILLARY: 178 mg/dL — AB (ref 65–99)
GLUCOSE-CAPILLARY: 152 mg/dL — AB (ref 65–99)
GLUCOSE-CAPILLARY: 154 mg/dL — AB (ref 65–99)
Glucose-Capillary: 135 mg/dL — ABNORMAL HIGH (ref 65–99)
Glucose-Capillary: 145 mg/dL — ABNORMAL HIGH (ref 65–99)
Glucose-Capillary: 152 mg/dL — ABNORMAL HIGH (ref 65–99)
Glucose-Capillary: 167 mg/dL — ABNORMAL HIGH (ref 65–99)
Glucose-Capillary: 223 mg/dL — ABNORMAL HIGH (ref 65–99)

## 2016-08-23 LAB — BASIC METABOLIC PANEL
Anion gap: 5 (ref 5–15)
BUN: 13 mg/dL (ref 6–20)
CHLORIDE: 106 mmol/L (ref 101–111)
CO2: 29 mmol/L (ref 22–32)
CREATININE: 0.75 mg/dL (ref 0.44–1.00)
Calcium: 8.6 mg/dL — ABNORMAL LOW (ref 8.9–10.3)
GFR calc non Af Amer: >60 mL/min (ref >60)
Glucose, Bld: 176 mg/dL — ABNORMAL HIGH (ref 65–99)
Potassium: 4.7 mmol/L (ref 3.5–5.1)
Sodium: 140 mmol/L (ref 135–145)

## 2016-08-23 LAB — HEMOGLOBIN AND HEMATOCRIT, BLOOD
HCT: 28.8 % — ABNORMAL LOW (ref 36.0–46.0)
HEMOGLOBIN: 9.2 g/dL — AB (ref 12.0–15.0)

## 2016-08-23 MED ORDER — PROMETHAZINE HCL 25 MG PO TABS
25.0000 mg | ORAL_TABLET | Freq: Four times a day (QID) | ORAL | Status: DC | PRN
  Administered 2016-08-23: 50 mg via ORAL
  Filled 2016-08-23: qty 2

## 2016-08-23 MED ORDER — METHYLPREDNISOLONE SODIUM SUCC 125 MG IJ SOLR
125.0000 mg | Freq: Once | INTRAMUSCULAR | Status: AC
  Administered 2016-08-23: 125 mg via INTRAVENOUS
  Filled 2016-08-23: qty 2

## 2016-08-23 MED ORDER — CEFEPIME HCL 1 G IJ SOLR
1.0000 g | Freq: Three times a day (TID) | INTRAMUSCULAR | Status: DC
  Administered 2016-08-23 – 2016-08-24 (×5): 1 g via INTRAVENOUS
  Filled 2016-08-23 (×8): qty 1

## 2016-08-23 MED ORDER — VANCOMYCIN HCL IN DEXTROSE 1-5 GM/200ML-% IV SOLN
1000.0000 mg | Freq: Two times a day (BID) | INTRAVENOUS | Status: DC
  Administered 2016-08-23 – 2016-08-25 (×4): 1000 mg via INTRAVENOUS
  Filled 2016-08-23 (×4): qty 200

## 2016-08-23 MED ORDER — SODIUM CHLORIDE 0.9 % IJ SOLN
INTRAMUSCULAR | Status: AC
  Filled 2016-08-23: qty 50

## 2016-08-23 MED ORDER — PREDNISONE 20 MG PO TABS
40.0000 mg | ORAL_TABLET | Freq: Every day | ORAL | Status: DC
  Administered 2016-08-24 – 2016-08-25 (×2): 40 mg via ORAL
  Filled 2016-08-23 (×2): qty 2

## 2016-08-23 MED ORDER — IOPAMIDOL (ISOVUE-300) INJECTION 61%
INTRAVENOUS | Status: AC
  Filled 2016-08-23: qty 75

## 2016-08-23 MED ORDER — ONDANSETRON HCL 4 MG/2ML IJ SOLN
8.0000 mg | Freq: Four times a day (QID) | INTRAMUSCULAR | Status: DC | PRN
  Administered 2016-08-23 (×2): 8 mg via INTRAVENOUS
  Filled 2016-08-23 (×2): qty 4

## 2016-08-23 MED ORDER — IOPAMIDOL (ISOVUE-300) INJECTION 61%
75.0000 mL | Freq: Once | INTRAVENOUS | Status: AC | PRN
  Administered 2016-08-23: 75 mL via INTRAVENOUS

## 2016-08-23 NOTE — Progress Notes (Signed)
Pharmacy Antibiotic Note  [name] is a 70 y.o. female admitted on 08/21/2016 with pneumonia.  Pharmacy has been consulted for Vanc/cefepime dosing.  Plan: Vancomycin 1g IV every 12 hours.  Goal trough 15-20 mcg/mL. The dose of cefepime will be adjusted to 1g q8 based on renal function.  Height: '5\' 1"'$  (154.9 cm) Weight: 198 lb 10.2 oz (90.1 kg) IBW/kg (Calculated) : 47.8  Temp (24hrs), Avg:98.2 F (36.8 C), Min:97.6 F (36.4 C), Max:98.6 F (37 C)   Recent Labs Lab 08/21/16 1147 08/21/16 1343 08/22/16 0611 08/23/16 0526 08/23/16 0527  WBC 3.9 3.8* 4.1  --  4.7  CREATININE 0.8 0.84 0.82 0.75  --     Estimated Creatinine Clearance: 66.8 mL/min (by C-G formula based on SCr of 0.75 mg/dL).    Allergies  Allergen Reactions  . Celebrex [Celecoxib] Palpitations and Other (See Comments)    Chest pain, tachycardia, diaphoresis  . Niacin Rash  . Percocet [Oxycodone-Acetaminophen] Palpitations    Update 07/23/16 - pt reports racing heart with Percocet, but has tolerated oxycodone.  . Varenicline Tartrate Rash    Reaction to [place]    Thank you for allowing pharmacy to be a part of this patient's care.   Adrian Saran, PharmD, BCPS Pager [phone] 08/23/2016 3:18 PM

## 2016-08-23 NOTE — Progress Notes (Signed)
PROGRESS NOTE  [name]  XIP:[id number] DOB: 1945/11/19 DOA: 08/21/2016 PCP: [name], MD  Outpatient Specialists: - GI Dr. Ardis Hughs - Oncology Dr. Julien Nordmann  Brief Narrative: [name] is a 70 y.o. female with a history of NSCLC s/p lobectomy, adjuvant chemo, postadjuvant XRT and most recently palliative radiation who presented to the [facility] cancer center complaining of dyspnea, weakness, poor appetite, and dark stools. She was found to be anemic (hgb 6.6), sent to the ED and admitted for further work up. EGD 11/17 showed esophageal edema consistent with h/o radiation, without esophagitis, gastritis, or duodenitis. Transfusion of 2u PRBCs improved hgb to 9.5, which has remained stable. There have been no stools since admission. Following EGD 11/17, she has had increased oxygen requirement to 5L still saturating in upper 80%'s. [name] has very emphatically expressed her desire NOT to be on BiPAP or intubated at any time and not to be resuscitated if her heart were to stop.  Assessment & Plan: Principal Problem:   Symptomatic anemia Active Problems:   Adenocarcinoma of right lung, stage 3 (HCC)   Essential hypertension   RADIATION PNEUMONITIS   Coronary artery disease   GERD (gastroesophageal reflux disease)   Hyperlipidemia   COPD with hypoxia (HCC)   DNR (do not resuscitate)   Melena   Acute post-hemorrhagic anemia   Antiplatelet or antithrombotic long-term use  Acute on chronic hypoxemic respiratory failure with hypoxia: Due to progressive metastatic lung cancer, pleural effusion, and COPD with recent hospitalization for exacerbation.  - Effusion appears small, likely malignant, risks greater than benefits of thoracentesis at this time.  - Continue homebronchodilators - Continue oxygen by nasal cannula to maintain saturations 88-92% and oximetry monitoring - Dense masslike consolidation in RLL noted on CT abd/pelvis concerning for malignancy vs. pneumonia without signs of  pneumonia.  - Will get CT chest to evaluate worsening respiratory function. Low threshold to start abx for HCAP (+MRSA swab)/aspiration (EGD 11/17). - Discussed possibility of respiratory decompensation: Does not want any NIPPV even for short term. If work of breathing worsens, would get ABG for prognostic purposes. Would want to be at home if nothing is left to be done.  GI bleeding: Presumed upper source, likely radiation-induced. EGD 11/17 showed no obvious bleeding source. No further endoscopy recommended. - Augmenting antiemetics: Zofran '8mg'$  prn and phenergan 25-'50mg'$  prn. - Clear liquid diet as tolerated - Hold plavix per GI - Monitor for recurrent melena - Restarted daily po PPI  Acute symptomatic blood loss anemia with hypotension: Normocytic, hypochromic. Due to GI bleed above. Hgb 6.6 > 9.5 s/p 2u PRBCs 11/16 - Repeat CBC stable. Can no longer blame respiratory distress on anemia.   Recurrent NSCLC adenocarcinoma: Stage IIIb followed by Dr. Julien Nordmann who was planning beginning immunotherapy with nivolumab today. Oct 2017 CT chest showed right upper lobe collapse and obstructive lesion with right pleural effusion, mediastinal and upper abdominal lymphadenopathy.  - Sent from cancer center, Dr. Julien Nordmann added to treatment team.  - DO NOT RESUSCITATE: Patient active with outpatient hospice, recently stopped palliative radiation therapy due to side effects. Declined offer to reconsult palliative care team during this admission. - Home oxycodone continued prn pain (documented percocet allergy noted, not a true allergy)  CAD s/p PCI Feb 2017: Followed by Dr. Percival Spanish. Recent echocardiogram demonstrated a severely calcified MV, preserved EF and grade 2 DD. Appears volume down.  - Metoprolol held by PCP for hypotension and pressures remain soft, so will not order this at admission. ACE  inhibitor discontinued recently due to hypotension as well.  - Hold plavix, restart aspirin per GI - Given  progression of her metastatic disease, unclear benefit of statin medication at this time Tachycardia is not new: HR 117 at office visit 11/3. Consider restarting cardioselective beta blocker if BP maintains.   Insulin-dependent T2DM: She's had declining A1c in the setting of cancer and weight loss/anorexia.  - Home insulin decreased by half with D/C mealtime insulin while NPO.  - Sensitive SSI q4h while NPO  History of constipation related to opioids:  - Bowel regimen resumed.   DVT prophylaxis: SCD Code Status: DNR Family Communication: Daughter called but no answer this AM Disposition Plan: Ongoing monitoring. If no recurrent melena noted, may discharge in next 24-48 hours.  Consultants:   GI, Dr. Hilarie Fredrickson  Procedures:   EGD 11/17 Impression:                - Congested mucosa in the esophagus like radiation effect. No esophagitis, ulceration or bleeding. - Normal stomach. No evidence of gastritis, ulceration or bleeding. - Normal examined duodenum. No duodenitis, ulceration or bleeding. - No specimens collected.  Antimicrobials:  None   Subjective: Patient feels overall worse. Nausea not controlled and has been more short of breath since the procedure yesterday. No chest pain. Has been requiring increased oxygen from baseline despite improvement of anemia. Discussed next steps in case of decompensation: Refuses BiPAP. Will let me know when her daughter gets here and we'll discuss this further.   Objective: Vitals:   08/22/16 2000 08/22/16 2032 08/23/16 0000 08/23/16 0400  BP:  (!) 160/52 (!) 135/40 (!) 113/56  Pulse:  (!) 113 (!) 104 (!) 113  Resp:  (!) 27 14 (!) 22  Temp: 98.4 F (36.9 C)  98 F (36.7 C) 98.6 F (37 C)  TempSrc: Oral  Oral Oral  SpO2:  93% 94% 90%  Weight:      Height:        Intake/Output Summary (Last 24 hours) at 08/23/16 0852 Last data filed at 08/23/16 0500  Gross per 24 hour  Intake          3788.34 ml  Output              551 ml    Net          3237.34 ml   Filed Weights   08/21/16 1715 08/22/16 1343  Weight: 90.1 kg (198 lb 10.2 oz) 90.1 kg (198 lb 10.2 oz)    Examination: Constitutional: 70 y.o. female in no distress, calm demeanor Eyes: Lids and conjunctival pallor, PERRL ENMT: Mucous membranes are tacky. Posterior pharynx clear of any exudate or lesions.  Neck: normal, supple, no masses, no thyromegaly Respiratory: Mildly labored on 5L O2. Reduced bibasilar R > L breath sounds, now with L-sided crackles. Cardiovascular: Tachycardic rate, regular rhythm, no murmurs, rubs, or gallops. No carotid bruits. No JVD. No LE edema. 1+ pedal pulses. Abdomen: Normoactive bowel sounds. Diffusely tender without distention, rebound or guarding. No hepatosplenomegaly. GU: No indwelling catheter Musculoskeletal: Mild clubbing, no cyanosis. No joint deformity upper and lower extremities. Good ROM, no contractures. Normal muscle tone.  Skin: Warm, dry, diffuse pallor without rashes/wounds or extensive bruising.  Neurologic: CN II-XII grossly intact. Gait not assessed. Speech normal. No focal deficits in motor strength or sensation in all extremities.  Psychiatric: Alert and oriented x3. Normal judgment and insight. Mood normal with appropriate affect.   Data Reviewed: I have personally reviewed following labs  and imaging studies  CBC:  Recent Labs Lab 08/21/16 1147 08/21/16 1343  08/22/16 0611 08/22/16 1120 08/22/16 1834 08/22/16 2324 08/23/16 0527  WBC 3.9 3.8*  --  4.1  --   --   --  4.7  NEUTROABS 2.5  --   --   --   --   --   --   --   HGB 6.6* 6.8*  < > 9.5* 9.5* 9.4* 9.2* 9.6*  HCT 22.7* 23.1*  < > 29.9* 30.7* 29.8* 28.8* 31.1*  MCV 80.8 81.6  --  81.0  --   --   --  84.1  PLT 140* 159  --  136*  --   --   --  151  < > = values in this interval not displayed. Basic Metabolic Panel:  Recent Labs Lab 08/21/16 1147 08/21/16 1343 08/22/16 0611  NA 143 139 142  K 3.8 3.4* 4.2  CL  --  100* 104  CO2 29 32  31  GLUCOSE 172* 172* 116*  BUN 19.4 22* 17  CREATININE 0.8 0.84 0.82  CALCIUM 9.7 9.4 9.1   GFR: Estimated Creatinine Clearance: 65.2 mL/min (by C-G formula based on SCr of 0.82 mg/dL). Liver Function Tests:  Recent Labs Lab 08/21/16 1147 08/21/16 1343  AST 34 39  ALT 14 16  ALKPHOS 88 75  BILITOT 0.31 0.6  PROT 5.7* 6.0*  ALBUMIN 2.4* 3.0*   No results for input(s): LIPASE, AMYLASE in the last 168 hours. No results for input(s): AMMONIA in the last 168 hours. Coagulation Profile:  Recent Labs Lab 08/21/16 1343  INR 1.09   Cardiac Enzymes: No results for input(s): CKTOTAL, CKMB, CKMBINDEX, TROPONINI in the last 168 hours. BNP (last 3 results) No results for input(s): PROBNP in the last 8760 hours. HbA1C: No results for input(s): HGBA1C in the last 72 hours. CBG:  Recent Labs Lab 08/22/16 1155 08/22/16 1634 08/22/16 2018 08/23/16 0011 08/23/16 0030  GLUCAP 162* 139* 173* 145* 152*   Lipid Profile: No results for input(s): CHOL, HDL, LDLCALC, TRIG, CHOLHDL, LDLDIRECT in the last 72 hours. Thyroid Function Tests:  Recent Labs  08/21/16 1147  TSH 2.039   Anemia Panel: No results for input(s): VITAMINB12, FOLATE, FERRITIN, TIBC, IRON, RETICCTPCT in the last 72 hours. Urine analysis:    Component Value Date/Time   COLORURINE YELLOW 06/17/2016 1023   APPEARANCEUR CLEAR 06/17/2016 1023   LABSPEC 1.020 06/17/2016 1023   PHURINE 5.5 06/17/2016 1023   GLUCOSEU >1000 (A) 06/17/2016 1023   HGBUR NEGATIVE 06/17/2016 1023   BILIRUBINUR NEGATIVE 06/17/2016 1023   KETONESUR NEGATIVE 06/17/2016 1023   PROTEINUR NEGATIVE 06/17/2016 1023   UROBILINOGEN 0.2 06/07/2015 1122   NITRITE POSITIVE (A) 06/17/2016 1023   LEUKOCYTESUR NEGATIVE 06/17/2016 1023   Sepsis Labs: '@LABRCNTIP'$ (procalcitonin:4,lacticidven:4)  ) Recent Results (from the past 240 hour(s))  TECHNOLOGIST REVIEW     Status: None   Collection Time: 08/21/16 11:47 AM  Result Value Ref Range Status     Technologist Review occ Metas and Myelocytes present  Final  MRSA PCR Screening     Status: Abnormal   Collection Time: 08/21/16  5:43 PM  Result Value Ref Range Status   MRSA by PCR POSITIVE (A) NEGATIVE Final    Comment:        The GeneXpert MRSA Assay (FDA approved for NASAL specimens only), is one component of a comprehensive MRSA colonization surveillance program. It is not intended to diagnose MRSA infection nor to guide or monitor  treatment for MRSA infections. RESULT CALLED TO, READ BACK BY AND VERIFIED WITH: Lincoln RN AT 1921 ON 08/21/16 BY S.VANHOORNE      Radiology Studies: Ct Abdomen Pelvis W Contrast  Result Date: 08/21/2016 CLINICAL DATA:  Upper abdominal pain, nausea and vomiting and dark stools EXAM: CT ABDOMEN AND PELVIS WITH CONTRAST TECHNIQUE: Multidetector CT imaging of the abdomen and pelvis was performed using the standard protocol following bolus administration of intravenous contrast. CONTRAST:  135m ISOVUE-300 IOPAMIDOL (ISOVUE-300) INJECTION 61% COMPARISON:  12/04/2014 FINDINGS: Lower chest: Dense consolidation in the RIGHT lower lobe. There is masslike consolidation in the infrahilar RIGHT lung measuring 5.8 x 3.8 cm. There is a moderate RIGHT pleural effusion. There is some thickening in the distal esophagus is below the carina (image 66 series 2. Hepatobiliary: Low-attenuation along the falciform ligament likely represents fatty infiltration have image 31 series 2 postcholecystectomy. Pancreas: Pancreas is normal. No ductal dilatation. No pancreatic inflammation. Spleen: Normal spleen Adrenals/urinary tract: Adrenal glands and kidneys are normal. The ureters and bladder normal. Stomach/Bowel: Stomach, duodenum, cecum normal. The colon rectosigmoid colon normal. Vascular/Lymphatic: Abdominal aorta is normal caliber with atherosclerotic calcification. There is enlarged gastrohepatic ligament lymph node measuring 13 mm (image 31 series 2. Celiac lymph node  measures 16 mm on image 38, series 2 ER aortocaval node measures 14 mm on image 54. Reproductive: Post hysterectomy Other: No peritoneal nodularity.  No free fluid Musculoskeletal: Posterior lumbar fusion. No aggressive osseous lesion. IMPRESSION: 1. Dense masslike RIGHT infrahilar consolidation is concerning for lung carcinoma. Pneumonia could have a similar appearance. Recommend CT thorax with contrast and consider bronchoscopy. 2. Diffuse thickening of the esophagus inferior to the carina. 3. Enlarged gastrohepatic ligament lymph nodes and periaortic adenopathy. 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 Electronically Signed   By: SSuzy BouchardM.D.   On: 08/21/2016 15:19   Dg Chest Portable 1 View  Addendum Date: 08/22/2016   ADDENDUM REPORT: 08/22/2016 09:53 ADDENDUM: This addendum is given for the purpose of noting that was thought to represent a chest tube on the prior examination is a monitor lead. The report is otherwise unchanged. Electronically Signed   By: TInge RiseM.D.   On: 08/22/2016 09:53   Result Date: 08/22/2016 CLINICAL DATA:  Status post right chest tube placement today. History of right lung adenocarcinoma. EXAM: PORTABLE CHEST 1 VIEW COMPARISON:  PA and lateral chest 07/22/2016 and CT chest 07/18/2016. FINDINGS: New right chest tube is in place. Right pleural effusion is somewhat decreased with some improvement in aeration in the right lung. Left lung remains clear. No pneumothorax. Cardiac silhouette is largely obscured. IMPRESSION: Decreased right pleural effusion with some improvement in aeration in the right chest after chest tube placement. Negative for pneumothorax. Electronically Signed: By: TInge RiseM.D. On: 08/21/2016 14:18    Scheduled Meds: . sodium chloride   Intravenous Once  . fluticasone furoate-vilanterol  1 puff Inhalation Daily  . insulin aspart  0-9 Units Subcutaneous Q4H  . insulin detemir  25 Units  Subcutaneous BID  . nicotine  21 mg Transdermal Q24H  . pantoprazole  40 mg Oral Q0600  . polyvinyl alcohol  1 drop Both Eyes BID  . pregabalin  50 mg Oral BID  . senna  2 tablet Oral QHS  . sodium chloride flush  3 mL Intravenous Q12H  . umeclidinium bromide  1 puff Inhalation Daily   Continuous Infusions: . dextrose 5 % and 0.9 % NaCl with KCl 20 mEq/L 100  mL/hr at 08/23/16 0034     LOS: 2 days   Time spent: 25 minutes.  Vance Gather, MD [facility] Pager [phone]  If 7PM-7AM, please contact night-coverage [email or web address] Password [email or web address] 08/23/2016, 8:52 AM

## 2016-08-24 ENCOUNTER — Encounter: Payer: Self-pay | Admitting: Nurse Practitioner

## 2016-08-24 LAB — CBC
HEMATOCRIT: 29.4 % — AB (ref 36.0–46.0)
Hemoglobin: 9.2 g/dL — ABNORMAL LOW (ref 12.0–15.0)
MCH: 26.5 pg (ref 26.0–34.0)
MCHC: 31.3 g/dL (ref 30.0–36.0)
MCV: 84.7 fL (ref 78.0–100.0)
Platelets: 142 10*3/uL — ABNORMAL LOW (ref 150–400)
RBC: 3.47 MIL/uL — ABNORMAL LOW (ref 3.87–5.11)
RDW: 17.6 % — AB (ref 11.5–15.5)
WBC: 4.5 10*3/uL (ref 4.0–10.5)

## 2016-08-24 LAB — BASIC METABOLIC PANEL
Anion gap: 5 (ref 5–15)
BUN: 9 mg/dL (ref 6–20)
CALCIUM: 8.8 mg/dL — AB (ref 8.9–10.3)
CO2: 29 mmol/L (ref 22–32)
Chloride: 107 mmol/L (ref 101–111)
Creatinine, Ser: 0.75 mg/dL (ref 0.44–1.00)
GFR calc Af Amer: >60 mL/min (ref >60)
GLUCOSE: 210 mg/dL — AB (ref 65–99)
Potassium: 4.5 mmol/L (ref 3.5–5.1)
Sodium: 141 mmol/L (ref 135–145)

## 2016-08-24 LAB — GLUCOSE, CAPILLARY
GLUCOSE-CAPILLARY: 284 mg/dL — AB (ref 65–99)
GLUCOSE-CAPILLARY: 216 mg/dL — AB (ref 65–99)
Glucose-Capillary: 186 mg/dL — ABNORMAL HIGH (ref 65–99)
Glucose-Capillary: 166 mg/dL — ABNORMAL HIGH (ref 65–99)
Glucose-Capillary: 160 mg/dL — ABNORMAL HIGH (ref 65–99)

## 2016-08-24 MED ORDER — SENNA 8.6 MG PO TABS: 2.0000 | ORAL_TABLET | Freq: Every evening | ORAL | Status: DC | PRN

## 2016-08-24 MED ORDER — MUPIROCIN 2 % EX OINT
1.0000 "application " | TOPICAL_OINTMENT | Freq: Two times a day (BID) | CUTANEOUS | Status: DC
  Administered 2016-08-24 – 2016-08-25 (×3): 1 via NASAL
  Filled 2016-08-24: qty 22

## 2016-08-24 MED ORDER — INSULIN ASPART 100 UNIT/ML ~~LOC~~ SOLN
0.0000 [IU] | Freq: Three times a day (TID) | SUBCUTANEOUS | Status: DC
  Administered 2016-08-24: 8 [IU] via SUBCUTANEOUS
  Administered 2016-08-24: 2 [IU] via SUBCUTANEOUS

## 2016-08-24 MED ORDER — CHLORHEXIDINE GLUCONATE CLOTH 2 % EX PADS
6.0000 | MEDICATED_PAD | Freq: Every day | CUTANEOUS | Status: DC
  Administered 2016-08-25: 6 via TOPICAL

## 2016-08-24 NOTE — Progress Notes (Signed)
Nutrition Follow-up  DOCUMENTATION CODES:   Obesity unspecified  INTERVENTION:   Encouraged PO intakes of liquids and meals Encouraged family to bring snacks or foods that patient is craving from home RD to continue to monitor PO intakes.  NUTRITION DIAGNOSIS:   Increased nutrient needs related to cancer and cancer related treatments as evidenced by estimated needs.  GOAL:   Patient will meet greater than or equal to 90% of their needs  Progressing.  MONITOR:   PO intake, Labs, Weight trends, I & O's  ASSESSMENT:   70 y.o. female with a history of NSCLC s/p lobectomy, adjuvant chemo, postadjuvant XRT and most recently palliative radiation who presented to the [facility] cancer center complaining of dyspnea, weakness, poor appetite, and dark stools. She was found to be anemic (hgb 6.6), sent to the ED and is being admitted for active GI bleed causing acute on chronic anemia. She endorses 3 weeks of nausea worsening from her baseline with any movement or anything po, decreased po, frequent dysphagia/odynophagia solids > liquids. Emesis nearly daily without hematemesis, but has noted darkening of brown formed stool during this time associated with fatigue, weakness.   Patient in room with daughter at bedside. Pt eager to eat some solid foods. Pt states she hasn't felt like eating her liquid diet and is tired of soups at the moment. Pt's daughter states lately the pt has been craving asian foods like beef lo mein and teriyaki chicken. Encouraged family to bring foods from home, if desired. Pt reports some swallowing difficulties lately. EGD from 11/17 found esophageal edema. Pt states she mainly had issues swallowing solid foods. Denies any chewing or taste alterations. She does not use any protein supplements as home.   Per pt, her UBW prior to her cancer diagnosis was 226 lb. Lately she has been averaging 198 lb. Nutrition focused physical exam shows no sign of depletion of muscle mass or  body fat.  Medications: Senokot tablet daily, D5 and .9% NaCl w/ KCl infusion at 75 ml/hr -provides 306 kcal, Phenergan PRN, Zofran PRN Labs reviewed: CBGs: 166-186  Diet Order:  DIET SOFT Room service appropriate? Yes; Fluid consistency: Thin  Skin:  Reviewed, no issues  Last BM:  11/19  Height:   Ht Readings from Last 1 Encounters:  08/22/16 '5\' 1"'$  (1.549 m)    Weight:   Wt Readings from Last 1 Encounters:  08/24/16 203 lb 0.7 oz (92.1 kg)    Ideal Body Weight:  47.73 kg  BMI:  Body mass index is 38.36 kg/m.  Estimated Nutritional Needs:   Kcal:  8891-6945  Protein:  70-80g  Fluid:  2L/day  EDUCATION NEEDS:   Education needs addressed  Clayton Bibles, MS, RD, LDN Pager: [phone] After Hours Pager: [phone]

## 2016-08-24 NOTE — Progress Notes (Signed)
Chaplain referred by staff.  Ms [name] is a 70 year old cancer survivor, who has been diagnosed with another bout of cancer. She has been told that this time the cancer is terminal and there is nothing that can be done to cure the cancer, but there are palliative and comfort care methods to increase her quality of life for the time remaining.   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 She states that this is were she finds spiritual peace, but has always been open to any person of faith. She states she is at peace spiritually and she looks forward to ending her physical life in peace with herself, others in her life and with her God. She appreciates visits by chaplains to pray with her and provide EOL counsel and blessings.  Prayer and spiritual comfort provided. Page chaplain if Ms [name] needs or requests further spiritual care.  Sallee Lange. Tylyn Stankovich, [place]

## 2016-08-24 NOTE — Progress Notes (Signed)
PROGRESS NOTE  [name]  PNT:[id number] DOB: Feb 05, 1946 DOA: 08/21/2016 PCP: [name], MD  Outpatient Specialists: - GI Dr. Ardis Hughs - Oncology Dr. Julien Nordmann  Brief Narrative: [name] is a 70 y.o. female with a history of NSCLC s/p lobectomy, adjuvant chemo, postadjuvant XRT and most recently palliative radiation who presented to the [facility] cancer center complaining of dyspnea, weakness, poor appetite, and dark stools. She was found to be anemic (hgb 6.6), sent to the ED and admitted for further work up. EGD 11/17 showed esophageal edema consistent with h/o radiation, without esophagitis, gastritis, or duodenitis. Transfusion of 2u PRBCs improved hgb to 9.5, which has remained stable. Following EGD 11/17, she has had increased oxygen requirement to 5L saturating in upper 80%'s. CT chest showed increased right loculated pleural effusion and consolidation vs worsening collapse of right lung. Steroids and antibiotics were started 11/18 with improvement in hypoxemia back to baseline.   Assessment & Plan: Principal Problem:   Symptomatic anemia Active Problems:   Adenocarcinoma of right lung, stage 3 (HCC)   Essential hypertension   RADIATION PNEUMONITIS   Coronary artery disease   GERD (gastroesophageal reflux disease)   Hyperlipidemia   COPD with hypoxia (HCC)   DNR (do not resuscitate)   Melena   Acute post-hemorrhagic anemia   Antiplatelet or antithrombotic long-term use  Acute on chronic hypoxemic respiratory failure with hypoxia: Due to progressive metastatic lung cancer, pleural effusion, and COPD with recent hospitalization for exacerbation.  - Effusion has enlarged, likely malignant, risks greater than benefits of thoracentesis at this time.  - Continue homebronchodilators - Continue oxygen by nasal cannula to maintain saturations 88-92% and oximetry monitoring - Dense masslike consolidation in RLL noted on CT abd/pelvis concerning for malignancy vs. pneumonia. CT chest to  evaluate worsening respiratory function 11/18 showed increased loculated effusion and worsening collapse vs. consolidation of right lung.  - Started vanc/cefepime and steroids with improvement.   GI bleeding: Presumed upper source, likely radiation-induced. EGD 11/17 showed no obvious bleeding source. No further endoscopy recommended. - Augmenting antiemetics: Zofran '8mg'$  prn and phenergan 25-'50mg'$  prn. - Clear liquid diet as tolerated > adv to soft - Hold plavix per GI - Monitor for recurrent melena - Restarted daily po PPI  Acute symptomatic blood loss anemia with hypotension: Normocytic, hypochromic. Due to GI bleed above. Hgb 6.6 > 9.5 s/p 2u PRBCs 11/16 - Repeat CBC stable, will continue monitoring.   Recurrent NSCLC adenocarcinoma: Stage IIIb followed by Dr. Julien Nordmann who was planning beginning immunotherapy with nivolumab today. Oct 2017 CT chest showed right upper lobe collapse and obstructive lesion with right pleural effusion, mediastinal and upper abdominal lymphadenopathy.  - Sent from cancer center, Dr. Julien Nordmann added to treatment team.  - DO NOT RESUSCITATE: Patient active with outpatient hospice, recently stopped palliative radiation therapy due to side effects. Pt seen by palliative care 11/19.  - Home oxycodone continued prn pain (documented percocet allergy noted, not a true allergy)  CAD s/p PCI Feb 2017: Followed by Dr. Percival Spanish. Recent echocardiogram demonstrated a severely calcified MV, preserved EF and grade 2 DD. Appears volume down.  - Metoprolol held by PCP for hypotension and pressures remain soft, so will not order this at admission. ACE inhibitor discontinued recently due to hypotension as well.  - Hold plavix, restart aspirin per GI - Given progression of her metastatic disease, unclear benefit of statin medication at this time - N.b. Tachycardia is not new: HR 117 at office visit 11/3. Consider restarting cardioselective beta  blocker if BP maintains.    Insulin-dependent T2DM: She's had declining A1c in the setting of cancer and weight loss/anorexia.  - Home long-acting insulin decreased by half.  - Start mealtime insulin and AC/HS CBG's  History of constipation related to opioids:  - Bowel regimen resumed.   DVT prophylaxis: SCD Code Status: DNR Family Communication: Daughter called but no answer this AM Disposition Plan: Ongoing monitoring. If no recurrent melena noted, may discharge in next 24-48 hours.  Consultants:   GI, Dr. Hilarie Fredrickson  Procedures:   EGD 11/17 Impression:                - Congested mucosa in the esophagus like radiation effect. No esophagitis, ulceration or bleeding. - Normal stomach. No evidence of gastritis, ulceration or bleeding. - Normal examined duodenum. No duodenitis, ulceration or bleeding. - No specimens collected.  Antimicrobials:  None   Subjective: Patient much better this morning. Tolerating clear liquids, wants to advance to "real food." Had 2 BMs yesterday without obvious blood. Breathing back to baseline. No chest pain.   Objective: Vitals:   08/24/16 0200 08/24/16 0339 08/24/16 0343 08/24/16 0400  BP: (!) 132/51   (!) 115/48  Pulse: (!) 103   97  Resp: 20   (!) 0  Temp:  98.1 F (36.7 C)    TempSrc:  Oral    SpO2: 90%   99%  Weight:   92.1 kg (203 lb 0.7 oz)   Height:        Intake/Output Summary (Last 24 hours) at 08/24/16 0937 Last data filed at 08/24/16 0536  Gross per 24 hour  Intake             2210 ml  Output             1650 ml  Net              560 ml   Filed Weights   08/22/16 1343 08/23/16 1600 08/24/16 0343  Weight: 90.1 kg (198 lb 10.2 oz) 90.1 kg (198 lb 10.2 oz) 92.1 kg (203 lb 0.7 oz)    Examination: Constitutional: 70 y.o. female in no distress, calm demeanor Eyes: Lids and conjunctival pallor, PERRL ENMT: Mucous membranes are moist. Posterior pharynx clear of any exudate or lesions.  Neck: normal, supple, no masses, no thyromegaly Respiratory:  Nonlabored on 5L O2. Reduced bibasilar R > L breath sounds. Cardiovascular: Tachycardic rate, regular rhythm, no murmurs, rubs, or gallops. No carotid bruits. No JVD. No LE edema. 1+ pedal pulses. Abdomen: Normoactive bowel sounds. Epigastric tenderness without distention, rebound or guarding. No hepatosplenomegaly. GU: No indwelling catheter Musculoskeletal: Mild clubbing, no cyanosis. No joint deformity upper and lower extremities. Good ROM, no contractures. Normal muscle tone.  Skin: Warm, dry, diffuse pallor without rashes/wounds or extensive bruising.  Neurologic: CN II-XII grossly intact. Gait not assessed. Speech normal. No focal deficits in motor strength or sensation in all extremities.  Psychiatric: Alert and oriented x3. Normal judgment and insight. Mood normal with appropriate affect.   Data Reviewed: I have personally reviewed following labs and imaging studies  CBC:  Recent Labs Lab 08/21/16 1147 08/21/16 1343  08/22/16 0611 08/22/16 1120 08/22/16 1834 08/22/16 2324 08/23/16 0527 08/24/16 0328  WBC 3.9 3.8*  --  4.1  --   --   --  4.7 4.5  NEUTROABS 2.5  --   --   --   --   --   --   --   --  HGB 6.6* 6.8*  < > 9.5* 9.5* 9.4* 9.2* 9.6* 9.2*  HCT 22.7* 23.1*  < > 29.9* 30.7* 29.8* 28.8* 31.1* 29.4*  MCV 80.8 81.6  --  81.0  --   --   --  84.1 84.7  PLT 140* 159  --  136*  --   --   --  151 142*  < > = values in this interval not displayed. Basic Metabolic Panel:  Recent Labs Lab 08/21/16 1147 08/21/16 1343 08/22/16 0611 08/23/16 0526 08/24/16 0328  NA 143 139 142 140 141  K 3.8 3.4* 4.2 4.7 4.5  CL  --  100* 104 106 107  CO2 29 32 '31 29 29  '$ GLUCOSE 172* 172* 116* 176* 210*  BUN 19.4 22* '17 13 9  '$ CREATININE 0.8 0.84 0.82 0.75 0.75  CALCIUM 9.7 9.4 9.1 8.6* 8.8*   GFR: Estimated Creatinine Clearance: 67.7 mL/min (by C-G formula based on SCr of 0.75 mg/dL). Liver Function Tests:  Recent Labs Lab 08/21/16 1147 08/21/16 1343  AST 34 39  ALT 14 16    ALKPHOS 88 75  BILITOT 0.31 0.6  PROT 5.7* 6.0*  ALBUMIN 2.4* 3.0*   No results for input(s): LIPASE, AMYLASE in the last 168 hours. No results for input(s): AMMONIA in the last 168 hours. Coagulation Profile:  Recent Labs Lab 08/21/16 1343  INR 1.09   Cardiac Enzymes: No results for input(s): CKTOTAL, CKMB, CKMBINDEX, TROPONINI in the last 168 hours. BNP (last 3 results) No results for input(s): PROBNP in the last 8760 hours. HbA1C: No results for input(s): HGBA1C in the last 72 hours. CBG:  Recent Labs Lab 08/23/16 1635 08/23/16 1955 08/23/16 2335 08/24/16 0332 08/24/16 0754  GLUCAP 135* 154* 223* 186* 166*   Lipid Profile: No results for input(s): CHOL, HDL, LDLCALC, TRIG, CHOLHDL, LDLDIRECT in the last 72 hours. Thyroid Function Tests:  Recent Labs  08/21/16 1147  TSH 2.039   Anemia Panel: No results for input(s): VITAMINB12, FOLATE, FERRITIN, TIBC, IRON, RETICCTPCT in the last 72 hours. Urine analysis:    Component Value Date/Time   COLORURINE YELLOW 06/17/2016 1023   APPEARANCEUR CLEAR 06/17/2016 1023   LABSPEC 1.020 06/17/2016 1023   PHURINE 5.5 06/17/2016 1023   GLUCOSEU >1000 (A) 06/17/2016 1023   HGBUR NEGATIVE 06/17/2016 1023   BILIRUBINUR NEGATIVE 06/17/2016 1023   KETONESUR NEGATIVE 06/17/2016 1023   PROTEINUR NEGATIVE 06/17/2016 1023   UROBILINOGEN 0.2 06/07/2015 1122   NITRITE POSITIVE (A) 06/17/2016 1023   LEUKOCYTESUR NEGATIVE 06/17/2016 1023   Sepsis Labs: '@LABRCNTIP'$ (procalcitonin:4,lacticidven:4)  ) Recent Results (from the past 240 hour(s))  TECHNOLOGIST REVIEW     Status: None   Collection Time: 08/21/16 11:47 AM  Result Value Ref Range Status   Technologist Review occ Metas and Myelocytes present  Final  MRSA PCR Screening     Status: Abnormal   Collection Time: 08/21/16  5:43 PM  Result Value Ref Range Status   MRSA by PCR POSITIVE (A) NEGATIVE Final    Comment:        The GeneXpert MRSA Assay (FDA approved for NASAL  specimens only), is one component of a comprehensive MRSA colonization surveillance program. It is not intended to diagnose MRSA infection nor to guide or monitor treatment for MRSA infections. RESULT CALLED TO, READ BACK BY AND VERIFIED WITH: Alanson RN AT [place] ON 08/21/16 BY S.VANHOORNE      Radiology Studies: Ct Chest W Contrast  Result Date: 08/23/2016 CLINICAL DATA:  Worsening acute on chronic  respiratory failure. Right lung adenocarcinoma. Right pleural effusion. Previous chemotherapy and radiation therapy. EXAM: CT CHEST WITH CONTRAST TECHNIQUE: Multidetector CT imaging of the chest was performed during intravenous contrast administration. CONTRAST:  34m ISOVUE-300 IOPAMIDOL (ISOVUE-300) INJECTION 61% COMPARISON:  07/18/2016 FINDINGS: Cardiovascular: Normal heart size. No evidence of pericardial effusion. Aortic atherosclerosis. Coronary artery calcification. Mediastinum/Nodes: Mild decrease in mediastinal lymphadenopathy seen in the right paratracheal, lateral aortic and subcarinal regions. Index lymph node in the lateral aortic region measures 1.5 cm on image 40/2 compared to 1.7 cm previously. Other index lymph node in the right paratracheal region measures 1.2 cm on image 39/2 compared with 1.7 cm previously. 6 mm lymph node in the right cardiophrenic angle shows no significant change in size. No new or increased lymphadenopathy identified. Diffuse esophageal wall thickening is again demonstrated likely due to radiation esophagitis. Lungs/Pleura: Multilocular right pleural effusion shows mild increase in size. Near complete collapse or consolidation of the right lung is again demonstrated. Postop changes are again seen in the right perihilar region and medial right lung. New tiny left pleural effusion is seen. There is also new diffuse ground-glass pulmonary opacity with mild atelectasis or infiltrate in the inferior lingula and left lower lobe. Seen in the inferior lingula and left  lower lobe. Upper Abdomen: Upper abdominal lymphadenopathy in the porta hepatis, celiac axis region, and upper retroperitoneum show mild increase in size and number since previous study. Index lymph node in the celiac axis region measures 1.5 cm compared to 1.3 cm previously. Retroperitoneal lymph node adjacent to left adrenal gland measures 1.7 cm on image 139/2 compared to 1.1 cm previously. No adrenal masses identified. Stable mild splenomegaly. Musculoskeletal: No suspicious bone lesions or other significant abnormality. IMPRESSION: Mild increase in size of multilocular right pleural effusion and increased collapse or consolidation of right lung. Mild decrease in mediastinal lymphadenopathy. Mild increase in upper abdominal lymphadenopathy. Increased tiny left pleural effusion. Increased diffuse left lung ground-glass opacity with mild infiltrate or atelectasis in the inferior lingula and left lower lobe. This may be due to pulmonary edema, infection, or less likely lymphangitic spread of carcinoma. Electronically Signed   By: JEarle GellM.D.   On: 08/23/2016 14:43    Scheduled Meds: . sodium chloride   Intravenous Once  . ceFEPime (MAXIPIME) IV  1 g Intravenous Q8H  . fluticasone furoate-vilanterol  1 puff Inhalation Daily  . insulin aspart  0-9 Units Subcutaneous Q4H  . insulin detemir  25 Units Subcutaneous BID  . nicotine  21 mg Transdermal Q24H  . pantoprazole  40 mg Oral Q0600  . polyvinyl alcohol  1 drop Both Eyes BID  . predniSONE  40 mg Oral Q breakfast  . pregabalin  50 mg Oral BID  . senna  2 tablet Oral QHS  . sodium chloride flush  3 mL Intravenous Q12H  . umeclidinium bromide  1 puff Inhalation Daily  . vancomycin  1,000 mg Intravenous Q12H   Continuous Infusions: . dextrose 5 % and 0.9 % NaCl with KCl 20 mEq/L 75 mL/hr at 08/24/16 0400     LOS: 3 days   Time spent: 25 minutes.  RVance Gather MD [facility] Pager [phone] If 7PM-7AM, please contact  night-coverage [email or web address] Password [email or web address]11/19/2017, 9:37 AM

## 2016-08-24 NOTE — Consult Note (Addendum)
Consultation Note Date: 08/24/2016   Patient Name: [name]  DOB: [birth year]  MRN: [id number]  Age / Sex: 70 y.o., female  PCP: Celene Squibb, MD Referring Physician: Patrecia Pour, MD  Reason for Consultation: Establishing goals of care  HPI/Patient Profile: 70 y.o. female    admitted on 08/21/2016  [name] is a 70 y.o. female with a history of NSCLC s/p lobectomy, adjuvant chemo, postadjuvant XRT and most recently palliative radiation who presented to the [facility] complaining of dyspnea, weakness, poor appetite, and dark stools. She was found to be anemic (hgb 6.6), sent to the ED and admitted for further work up. EGD 11/17 showed esophageal edema consistent with h/o radiation, without esophagitis, gastritis, or duodenitis. Transfusion of 2u PRBCs improved hgb to 9.5, which has remained stable. Following EGD 11/17, she has had increased oxygen requirement to 5L saturating in upper 80%'s. CT chest showed increased right loculated pleural effusion and consolidation vs worsening collapse of right lung. Steroids and antibiotics were started 11/18 with improvement in hypoxemia back to baseline.      Clinical Assessment and Goals of Care:  Patient is known to palliative service, she has been seen by the undersigned in a previous hospitalization. She is resting in bed, she is awake and alert. She states that her breathing is at baseline, her daughter [name] is also by the bedside, I re introduced myself and palliative care as follows: Palliative medicine is specialized medical care for people living with serious illness. It focuses on providing relief from the symptoms and stress of a serious illness. The goal is to improve quality of life for both the patient and the family.  Patient complains of abdominal cramping, she has had a lot of Bowel movements, reportedly after receiving 2 Senokot tablets  last night. She has eaten around 80% of her breakfast.   Patient has not been able to tolerate radiation treatments, she took about 6 treatments. She was due to receive immunotherapy after she saw her oncologist Dr Earlie Server in his office late October 2017. She is thankful for his care, voices a lot of trust in him. She is aware of hospice of [facility] and would be accepting of their services if requested directly by Dr Earlie Server.   Thank you for the consult, continue current mode of care, see below:  NEXT OF KIN  daughter [name]   SUMMARY OF RECOMMENDATIONS    DNR Continue current mode of treatment Patient is reportedly waiting to discuss further with her oncologist Dr Earlie Server regarding whether or not she ought to get more immunotherapy She wishes to discuss directly with Dr Earlie Server whether or not she ought to pursue Hospice at this time. If he agrees, then she would like to proceed with Hospice referral to [facility] and go home with hospice services towards the end of this hospitalization.   Code Status/Advance Care Planning:  DNR    Symptom Management:    continue current treatment.   Palliative Prophylaxis:  Bowel Regimen   Psycho-social/Spiritual:   Desire for further Chaplaincy support:no  Additional Recommendations: Education on Hospice  Prognosis:   < 6 months ? Given recent progression of her cancer, she was not able to tolerate radiation treatments.   Discharge Planning: Home with Hospice after awaiting further discussions with her oncologist Dr Earlie Server.       Primary Diagnoses: Present on Admission: . Symptomatic anemia . Adenocarcinoma of right lung, stage 3 ([place]) . Coronary artery disease . RADIATION PNEUMONITIS . Essential hypertension . GERD (gastroesophageal reflux disease) . Hyperlipidemia . COPD with hypoxia ([place]) . DNR (do not resuscitate)   I have reviewed the medical record, interviewed the patient and family,  and examined the patient. The following aspects are pertinent.  Past Medical History:  Diagnosis Date  . Adenocarcinoma of right lung, stage 3 ([place]) 08/28/2009      . Asthma   . Cholelithiasis   . Chronic bronchitis ([place])   . Colon polyps   . COPD (chronic obstructive pulmonary disease) ([facility])    USES O2 AT NIGHT + PRN IN DAYTIME3L  . Coronary artery disease    a. 2005 Cath/PCI: mRCA-> Cypher DES;  b. 06/2014 Cath/PCI: EF 60%. LM nl, LAD min irregs, D1 40, LCX 20p, OM2 95 (2.5x14 Resolute DES), RCA 30p, 30 ISR, 20d, PDA/PLA nl..  11/2015  Stenting of the entire RCA and LV branch off the RCA.    . Emphysema   . FH: chemotherapy 2010    had 4 times  . Fibromyalgia   . GERD (gastroesophageal reflux disease)   . H/O hiatal hernia   . Hyperlipidemia   . Hypertension   . Lung cancer ([place]) 2010   Adenocarcinoma, right lung, node positive  . On home oxygen therapy    "2-4L at night and prn" (07/18/2104)  . Oxygen dependent    at night  3liters  . Peripheral vascular disease ([place])   . Pneumonia    hx of   . Radiation 6/10   28 times right lung, and 5 treatments to sternum  . Stress incontinence   . Tobacco abuse   . Type II diabetes mellitus ([place])    Social History   Social History  . Marital status: Widowed    Spouse name: N/A  . Number of children: N/A  . Years of education: N/A   Occupational History  . disabled    Social History Main Topics  . Smoking status: Former Smoker    Packs/day: 1.00    Years: 53.00    Types: Cigarettes    Quit date: 06/17/2016  . Smokeless tobacco: Never Used  . Alcohol use No  . Drug use: No  . Sexual activity: No   Other Topics Concern  . None   Social History Narrative   Daughter lives with her.  Also her mother and grandson also live with her.      Family History  Problem Relation Age of Onset  . Diabetes Mother   . Heart failure Mother   . Diabetes Father   . CAD Father 61  . Arthritis    . Lung disease    . Cancer    .  Asthma     Scheduled Meds: . sodium chloride   Intravenous Once  . ceFEPime (MAXIPIME) IV  1 g Intravenous Q8H  . fluticasone furoate-vilanterol  1 puff Inhalation Daily  . insulin aspart  0-15 Units Subcutaneous TID WC  . insulin detemir  25 Units Subcutaneous BID  .  nicotine  21 mg Transdermal Q24H  . pantoprazole  40 mg Oral Q0600  . polyvinyl alcohol  1 drop Both Eyes BID  . predniSONE  40 mg Oral Q breakfast  . pregabalin  50 mg Oral BID  . senna  2 tablet Oral QHS  . sodium chloride flush  3 mL Intravenous Q12H  . umeclidinium bromide  1 puff Inhalation Daily  . vancomycin  1,000 mg Intravenous Q12H   Continuous Infusions: . dextrose 5 % and 0.9 % NaCl with KCl 20 mEq/L 75 mL/hr at 08/24/16 1000   PRN Meds:.albuterol, bisacodyl, nitroGLYCERIN, ondansetron, oxyCODONE, promethazine Medications Prior to Admission:  Prior to Admission medications   Medication Sig Start Date End Date Taking? Authorizing Provider  aspirin EC 81 MG tablet Take 81 mg by mouth daily.    Yes Historical Provider, MD  Cholecalciferol (VITAMIN D) 1000 UNITS capsule Take 1,000 Units by mouth daily.    Yes Historical Provider, MD  clopidogrel (PLAVIX) 75 MG tablet Take 1 tablet (75 mg total) by mouth daily with breakfast. 11/10/15  Yes Einar Pheasant Hager, PA-C  fluticasone furoate-vilanterol (BREO ELLIPTA) 100-25 MCG/INH AEPB Inhale 1 puff into the lungs 2 (two) times daily.    Yes Historical Provider, MD  furosemide (LASIX) 20 MG tablet Take 1 tablet (20 mg total) by mouth daily as needed for edema. 06/20/16  Yes Kelvin Cellar, MD  insulin detemir (LEVEMIR) 100 UNIT/ML injection Inject 45-55 Units into the skin 2 (two) times daily. Inject 55 units subcutaneously every morning and 45 units at bedtime   Yes Historical Provider, MD  lovastatin (MEVACOR) 40 MG tablet Take 20 mg by mouth daily after supper.    Yes Historical Provider, MD  oxyCODONE 10 MG TABS Take 1 tablet (10 mg total) by mouth every 4 (four) hours as  needed for severe pain. 07/25/16  Yes Janece Canterbury, MD  Polyethyl Glycol-Propyl Glycol (SYSTANE) 0.4-0.3 % SOLN Place 1 drop into both eyes 2 (two) times daily.    Yes Historical Provider, MD  pregabalin (LYRICA) 50 MG capsule Take 50 mg by mouth 2 (two) times daily.    Yes Historical Provider, MD  promethazine (PHENERGAN) 25 MG tablet Take 25 mg by mouth every 6 (six) hours as needed for nausea or vomiting.   Yes Celene Squibb, MD  umeclidinium bromide (INCRUSE ELLIPTA) 62.5 MCG/INH AEPB Inhale 1 puff into the lungs 2 (two) times daily.   Yes Historical Provider, MD  acetaminophen (TYLENOL) 500 MG tablet Take 1,000 mg by mouth every 6 (six) hours as needed for mild pain.     Historical Provider, MD  albuterol (PROVENTIL) (2.5 MG/3ML) 0.083% nebulizer solution Take 3 mLs (2.5 mg total) by nebulization every 6 (six) hours as needed for wheezing or shortness of breath. 08/08/16   Rigoberto Noel, MD  bisacodyl (DULCOLAX) 10 MG suppository Place 1 suppository (10 mg total) rectally daily as needed for mild constipation or moderate constipation. 07/25/16   Janece Canterbury, MD  insulin aspart (NOVOLOG) 100 UNIT/ML injection Inject 15-25 Units into the skin as needed for high blood sugar. Per sliding scale: CBG <200 15 units, 200-260 20 units, >260 25 units    Historical Provider, MD  nitroGLYCERIN (NITROSTAT) 0.4 MG SL tablet Place 1 tablet (0.4 mg total) under the tongue every 5 (five) minutes x 3 doses as needed for chest pain. 06/21/14   Erlene Quan, PA-C  nystatin (MYCOSTATIN/NYSTOP) powder Apply topically 3 (three) times daily. 07/25/16   Janece Canterbury,  MD  senna (SENOKOT) 8.6 MG TABS tablet Take 2 tablets (17.2 mg total) by mouth at bedtime. 07/25/16   Janece Canterbury, MD   Allergies  Allergen Reactions  . Celebrex [Celecoxib] Palpitations and Other (See Comments)    Chest pain, tachycardia, diaphoresis  . Niacin Rash  . Percocet [Oxycodone-Acetaminophen] Palpitations    Update 07/23/16 - pt  reports racing heart with Percocet, but has tolerated oxycodone.  . Varenicline Tartrate Rash    Reaction to [place]   Review of Systems + for abdominal cramping this morning  Physical Exam NAD S1 S2 Clear anteriorly Abdomen soft, pain in both lower quadrants Trace edema Non focal  Vital Signs: BP (!) 156/74   Pulse (!) 103   Temp 98.1 F (36.7 C) (Oral)   Resp 20   Ht '5\' 1"'$  (1.549 m)   Wt 92.1 kg (203 lb 0.7 oz)   SpO2 98%   BMI 38.36 kg/m  Pain Assessment: 0-10   Pain Score: 6    SpO2: SpO2: 98 % O2 Device:SpO2: 98 % O2 Flow Rate: .O2 Flow Rate (L/min): 5 L/min  IO: Intake/output summary:  Intake/Output Summary (Last 24 hours) at 08/24/16 1014 Last data filed at 08/24/16 1000  Gross per 24 hour  Intake             2560 ml  Output             1650 ml  Net              910 ml    LBM: Last BM Date: 08/23/16 Baseline Weight: Weight: 90.1 kg (198 lb 10.2 oz) Most recent weight: Weight: 92.1 kg (203 lb 0.7 oz)     Palliative Assessment/Data:   Flowsheet Rows   Flowsheet Row Most Recent Value  Intake Tab  Referral Department  Hospitalist  Unit at Time of Referral  ICU  Palliative Care Primary Diagnosis  Cancer  Palliative Care Type  Return patient Palliative Care  Reason for referral  Non-pain Symptom, Clarify Goals of Care, Counsel Regarding Hospice  Date first seen by Palliative Care  08/24/16  Clinical Assessment  Palliative Performance Scale Score  30%  Pain Max last 24 hours  5  Pain Min Last 24 hours  4  Dyspnea Max Last 24 Hours  6  Dyspnea Min Last 24 hours  4  Nausea Max Last 24 Hours  5  Nausea Min Last 24 Hours  4  Psychosocial & Spiritual Assessment  Palliative Care Outcomes  Patient/Family meeting held?  Yes  Who was at the meeting?  patient daughter [name]   Palliative Care follow-up planned  Yes, Home      Time In:  9 Time Out:10   Time Total: 60  Greater than 50%  of this time was spent counseling and coordinating care related  to the above assessment and plan.  Signed by: Loistine Chance, MD  [phone]  Please contact Palliative Medicine Team phone at [phone] for questions and concerns.  For individual provider: See Shea Evans

## 2016-08-25 ENCOUNTER — Encounter (HOSPITAL_COMMUNITY): Payer: Self-pay | Admitting: Internal Medicine

## 2016-08-25 LAB — BASIC METABOLIC PANEL
Anion gap: 5 (ref 5–15)
BUN: 10 mg/dL (ref 6–20)
CALCIUM: 8.8 mg/dL — AB (ref 8.9–10.3)
CO2: 31 mmol/L (ref 22–32)
CREATININE: 0.82 mg/dL (ref 0.44–1.00)
Chloride: 105 mmol/L (ref 101–111)
GFR calc non Af Amer: >60 mL/min (ref >60)
Glucose, Bld: 144 mg/dL — ABNORMAL HIGH (ref 65–99)
Potassium: 4.2 mmol/L (ref 3.5–5.1)
SODIUM: 141 mmol/L (ref 135–145)

## 2016-08-25 LAB — CBC
HCT: 29.2 % — ABNORMAL LOW (ref 36.0–46.0)
Hemoglobin: 8.9 g/dL — ABNORMAL LOW (ref 12.0–15.0)
MCH: 26.2 pg (ref 26.0–34.0)
MCHC: 30.5 g/dL (ref 30.0–36.0)
MCV: 85.9 fL (ref 78.0–100.0)
Platelets: 139 10*3/uL — ABNORMAL LOW (ref 150–400)
RBC: 3.4 MIL/uL — ABNORMAL LOW (ref 3.87–5.11)
RDW: 17.9 % — AB (ref 11.5–15.5)
WBC: 5.6 10*3/uL (ref 4.0–10.5)

## 2016-08-25 LAB — GLUCOSE, CAPILLARY
GLUCOSE-CAPILLARY: 86 mg/dL (ref 65–99)
GLUCOSE-CAPILLARY: 130 mg/dL — AB (ref 65–99)

## 2016-08-25 MED ORDER — PREDNISONE 20 MG PO TABS: 40.0000 mg | ORAL_TABLET | Freq: Every day | ORAL | 0 refills | Status: AC

## 2016-08-25 MED ORDER — LEVOFLOXACIN 750 MG PO TABS
750.0000 mg | ORAL_TABLET | Freq: Every day | ORAL | Status: DC
  Administered 2016-08-25: 750 mg via ORAL
  Filled 2016-08-25: qty 1

## 2016-08-25 MED ORDER — LEVOFLOXACIN 750 MG PO TABS: 750.0000 mg | ORAL_TABLET | Freq: Every day | ORAL | 0 refills | Status: AC

## 2016-08-25 NOTE — Care Management Note (Signed)
Case Management Note  Patient Details  Name: [name] MRN: [id number] Date of Birth: 03-16-1946  Subjective/Objective:     Pt admitted wtih  Symptomatic anemia, SOB, weakness, and poor appetite.           Action/Plan: Plan to discharge home with [facility]   Expected Discharge Date:  08/25/2016               Expected Discharge Plan:  Home/Self Care  In-House Referral:     Discharge planning Services     Post Acute Care Choice:    Choice offered to:     DME Arranged:    DME Agency:     HH Arranged:    HH Agency:     Status of Service:  In process, will continue to follow  If discussed at Long Length of Stay Meetings, dates discussed:    Additional CommentsPurcell Mouton, RN 08/25/2016, 11:35 AM

## 2016-08-25 NOTE — Progress Notes (Signed)
Pt selected [facility] [phone], fax [phone], referral given to [place], [place] and information faxed. Pt's family at bedside. Very sweet lady.

## 2016-08-25 NOTE — Progress Notes (Signed)
Patient complained of her IV causing pain this morning. Patient stated she has been stuck numerous times and is refusing to be stuck again.  MD paged with this d/t IV fluids and IV abx to be given.  IV removed.  Will continue to monitor.

## 2016-08-25 NOTE — Discharge Summary (Signed)
Physician Discharge Summary  [name] ZOX:[id number] DOB: 31-Mar-1946 DOA: 08/21/2016  PCP: [name], MD  Admit date: 08/21/2016 Discharge date: 08/25/2016  Admitted From: Home Disposition: Home   Recommendations for Outpatient Follow-up:  1. Follow up with Dr. Julien Nordmann as soon as possible to follow up respiratory status and discuss hospice vs. immunotherapy. 2. Please obtain CBC to monitor anemia. Received 2u PRBCs, discharge hgb 8.9. Plavix was stopped due to concern for bleeding. 3. Monitor glucose, hyperglycemia caused by steroids noted. Prednisone finish date is 11/22.  Home Health: None new. Equipment/Devices: 5 L O2  Discharge Condition: Stable CODE STATUS: DNR Diet recommendation: Carbohydrate-modified  Brief/Interim Summary: Rita Mckinstry Wittyis a 70 y.o.femalewith a history of NSCLC s/p lobectomy, adjuvant chemo, postadjuvant XRT and most recently palliative radiation who presented to the [facility] center complaining of dyspnea, weakness, poor appetite, and dark stools. She was found to be anemic (hgb 6.6), sent to the ED and admitted for further work up. EGD 11/17 showed esophageal edema consistent with h/o radiation, without esophagitis, gastritis, or duodenitis. Transfusion of 2u PRBCs improved hgb to 9.5, which has remained stable. Following EGD 11/17, she has had increased oxygen requirement to 5L saturating in upper 80%'s. CT chest showed increased right loculated pleural effusion and consolidation vs worsening collapse of right lung. Steroids and antibiotics were started 11/18 with improvement in hypoxemia back to baseline.   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  - Effusion has enlarged, likely malignant, risks greater than benefits of thoracentesis at this time.  - Continue homebronchodilators - Continue oxygen by nasal cannula to maintain saturations 88-92% and oximetry monitoring - Dense masslike consolidation in RLL noted on CT abd/pelvis concerning for malignancy vs. pneumonia. CT chest to evaluate worsening respiratory function 11/18 showed increased loculated effusion and worsening collapse vs. consolidation of right lung.  - Started vanc/cefepime and steroids with improvement >> levaquin 11/20.    GI bleeding: EGD 11/17 showed no obvious bleeding source. No further endoscopy recommended. Hgb stable, 8.9 at discharge. - Augmented antiemetics: Zofran '8mg'$  prn and phenergan 25-'50mg'$  prn. - Tolerated carb-modified diet. - Hold plavix per GI - Monitor for recurrent melena - Restarted daily po PPI  Acute symptomatic blood loss anemia with hypotension: Normocytic, hypochromic. Due to GI bleed above. Hgb 6.6 > 9.5 s/p 2u PRBCs 11/16 - Repeat CBC stable, will continue monitoring.   Recurrent NSCLC adenocarcinoma: Stage IIIb followed by Dr. Julien Nordmann who was planning beginning immunotherapy with nivolumab today. Oct 2017 CT chest showed right upper lobe collapse and obstructive lesion with right pleural effusion, mediastinal and upper abdominal lymphadenopathy.  - Sent from cancer center, Dr. Julien Nordmann added to treatment team.  - DO NOT RESUSCITATE: Patient active with outpatient hospice, recently stopped palliative radiation therapy due to side effects. Pt seen by palliative care 11/19.  - Home oxycodone continued prn pain (documented percocet allergy noted, not a true allergy)  CAD s/p PCI Feb 2017:  Followed by Dr. Percival Spanish. Recent echocardiogram demonstrated a severely  calcified MV, preserved EF and grade 2 DD. Appears volume down.  - Metoprolol held by PCP for hypotension and pressures remain soft, so will not order this at admission. ACE inhibitor discontinued recently due to hypotension as well.  - Hold plavix, restart aspirin per GI - Given progression of her metastatic disease, unclear benefit of statin medication at this time - N.b. Tachycardia is not new: HR 117 at office visit 11/3. Consider restarting cardioselective beta blocker if BP maintains.   Insulin-dependent T2DM: She's had declining A1c in the setting of cancer and weight loss/anorexia.  - Home long-acting insulin decreased by half.  - Start mealtime insulin and AC/HS CBG's  History of constipation related to opioids:  - Bowel regimen resumed.   Discharge Instructions Discharge Instructions    Discharge instructions    Complete by:  As directed    You were admitted for anemia and given transfusions of blood. Your hemoglobin levels have remained stable. The EGD performed showed no sources of bleeding, but did show swelling in the esophagus which is due to radiation. You developed worsening shortness of breath and were started on antibiotics and steroids to treat a pneumonia and you will continue these at discharge.  - Take levaquin once daily starting 11/21 for 4 days - Take prednisone once daily starting 11/21 for 2 days - Follow up very soon with Dr. Julien Nordmann to discuss hospice vs. ongoing therapies  - Call home hospice to go ahead and come in because they may be able to help you and certainly will not hurt in any way.   It was a pleasure getting to know you and your wonderful family. Take care! - Dr. Bonner Puna   Increase activity slowly    Complete by:  As directed        Medication List    TAKE these medications   acetaminophen 500 MG tablet Commonly known as:  TYLENOL Take 1,000 mg by mouth every 6 (six)  hours as needed for mild pain.   albuterol (2.5 MG/3ML) 0.083% nebulizer solution Commonly known as:  PROVENTIL Take 3 mLs (2.5 mg total) by nebulization every 6 (six) hours as needed for wheezing or shortness of breath.   aspirin EC 81 MG tablet Take 81 mg by mouth daily.   bisacodyl 10 MG suppository Commonly known as:  DULCOLAX Place 1 suppository (10 mg total) rectally daily as needed for mild constipation or moderate constipation.   BREO ELLIPTA 100-25 MCG/INH Aepb Generic drug:  fluticasone furoate-vilanterol Inhale 1 puff into the lungs 2 (two) times daily.   clopidogrel 75 MG tablet Commonly known as:  PLAVIX Take 1 tablet (75 mg total) by mouth daily with breakfast.   furosemide 20 MG tablet Commonly known as:  LASIX Take 1 tablet (20 mg total) by mouth daily as needed for edema.   INCRUSE ELLIPTA 62.5 MCG/INH Aepb Generic drug:  umeclidinium bromide Inhale 1 puff into the lungs 2 (two) times daily.   insulin aspart 100 UNIT/ML injection Commonly known as:  novoLOG Inject 15-25 Units into the skin as needed for high blood sugar. Per sliding scale: CBG <200 15 units, 200-260 20 units, >260 25 units   insulin detemir 100 UNIT/ML injection Commonly known as:  LEVEMIR Inject 45-55 Units into the skin 2 (two) times daily. Inject 55 units subcutaneously every morning and 45 units at bedtime   levofloxacin 750 MG tablet Commonly known as:  LEVAQUIN Take 1 tablet (750 mg total) by mouth daily.   lovastatin 40  MG tablet Commonly known as:  MEVACOR Take 20 mg by mouth daily after supper.   LYRICA 50 MG capsule Generic drug:  pregabalin Take 50 mg by mouth 2 (two) times daily.   nitroGLYCERIN 0.4 MG SL tablet Commonly known as:  NITROSTAT Place 1 tablet (0.4 mg total) under the tongue every 5 (five) minutes x 3 doses as needed for chest pain.   nystatin powder Commonly known as:  MYCOSTATIN/NYSTOP Apply topically 3 (three) times daily.   Oxycodone HCl 10 MG  Tabs Take 1 tablet (10 mg total) by mouth every 4 (four) hours as needed for severe pain.   predniSONE 20 MG tablet Commonly known as:  DELTASONE Take 2 tablets (40 mg total) by mouth daily with breakfast. Start taking on:  08/26/2016   promethazine 25 MG tablet Commonly known as:  PHENERGAN Take 25 mg by mouth every 6 (six) hours as needed for nausea or vomiting.   senna 8.6 MG Tabs tablet Commonly known as:  SENOKOT Take 2 tablets (17.2 mg total) by mouth at bedtime.   SYSTANE 0.4-0.3 % Soln Generic drug:  Polyethyl Glycol-Propyl Glycol Place 1 drop into both eyes 2 (two) times daily.   Vitamin D 1000 units capsule Take 1,000 Units by mouth daily.      Follow-up Information    Eilleen Kempf., MD. Schedule an appointment as soon as possible for a visit.   Specialty:  Oncology Contact information: [place] [address] [phone]          Allergies  Allergen Reactions  . Celebrex [Celecoxib] Palpitations and Other (See Comments)    Chest pain, tachycardia, diaphoresis  . Niacin Rash  . Percocet [Oxycodone-Acetaminophen] Palpitations    Update 07/23/16 - pt reports racing heart with Percocet, but has tolerated oxycodone.  . Varenicline Tartrate Rash    Reaction to [place]    Consultations:  Gastroenterology: Dr. Hilarie Fredrickson  Palliative care: Dr. Rowe Pavy  Procedures/Studies: Ct Chest W Contrast  Result Date: 08/23/2016 CLINICAL DATA:  Worsening acute on chronic respiratory failure. Right lung adenocarcinoma. Right pleural effusion. Previous chemotherapy and radiation therapy. EXAM: CT CHEST WITH CONTRAST TECHNIQUE: Multidetector CT imaging of the chest was performed during intravenous contrast administration. CONTRAST:  46m ISOVUE-300 IOPAMIDOL (ISOVUE-300) INJECTION 61% COMPARISON:  07/18/2016 FINDINGS: Cardiovascular: Normal heart size. No evidence of pericardial effusion. Aortic atherosclerosis. Coronary artery calcification.  Mediastinum/Nodes: Mild decrease in mediastinal lymphadenopathy seen in the right paratracheal, lateral aortic and subcarinal regions. Index lymph node in the lateral aortic region measures 1.5 cm on image 40/2 compared to 1.7 cm previously. Other index lymph node in the right paratracheal region measures 1.2 cm on image 39/2 compared with 1.7 cm previously. 6 mm lymph node in the right cardiophrenic angle shows no significant change in size. No new or increased lymphadenopathy identified. Diffuse esophageal wall thickening is again demonstrated likely due to radiation esophagitis. Lungs/Pleura: Multilocular right pleural effusion shows mild increase in size. Near complete collapse or consolidation of the right lung is again demonstrated. Postop changes are again seen in the right perihilar region and medial right lung. New tiny left pleural effusion is seen. There is also new diffuse ground-glass pulmonary opacity with mild atelectasis or infiltrate in the inferior lingula and left lower lobe. Seen in the inferior lingula and left lower lobe. Upper Abdomen: Upper abdominal lymphadenopathy in the porta hepatis, celiac axis region, and upper retroperitoneum show mild increase in size and number since previous study. Index lymph node in the celiac  axis region measures 1.5 cm compared to 1.3 cm previously. Retroperitoneal lymph node adjacent to left adrenal gland measures 1.7 cm on image 139/2 compared to 1.1 cm previously. No adrenal masses identified. Stable mild splenomegaly. Musculoskeletal: No suspicious bone lesions or other significant abnormality. IMPRESSION: Mild increase in size of multilocular right pleural effusion and increased collapse or consolidation of right lung. Mild decrease in mediastinal lymphadenopathy. Mild increase in upper abdominal lymphadenopathy. Increased tiny left pleural effusion. Increased diffuse left lung ground-glass opacity with mild infiltrate or atelectasis in the inferior lingula  and left lower lobe. This may be due to pulmonary edema, infection, or less likely lymphangitic spread of carcinoma. Electronically Signed   By: Earle Gell M.D.   On: 08/23/2016 14:43   Ct Abdomen Pelvis W Contrast  Result Date: 08/21/2016 CLINICAL DATA:  Upper abdominal pain, nausea and vomiting and dark stools EXAM: CT ABDOMEN AND PELVIS WITH CONTRAST TECHNIQUE: Multidetector CT imaging of the abdomen and pelvis was performed using the standard protocol following bolus administration of intravenous contrast. CONTRAST:  151m ISOVUE-300 IOPAMIDOL (ISOVUE-300) INJECTION 61% COMPARISON:  12/04/2014 FINDINGS: Lower chest: Dense consolidation in the RIGHT lower lobe. There is masslike consolidation in the infrahilar RIGHT lung measuring 5.8 x 3.8 cm. There is a moderate RIGHT pleural effusion. There is some thickening in the distal esophagus is below the carina (image 66 series 2. Hepatobiliary: Low-attenuation along the falciform ligament likely represents fatty infiltration have image 31 series 2 postcholecystectomy. Pancreas: Pancreas is normal. No ductal dilatation. No pancreatic inflammation. Spleen: Normal spleen Adrenals/urinary tract: Adrenal glands and kidneys are normal. The ureters and bladder normal. Stomach/Bowel: Stomach, duodenum, cecum normal. The colon rectosigmoid colon normal. Vascular/Lymphatic: Abdominal aorta is normal caliber with atherosclerotic calcification. There is enlarged gastrohepatic ligament lymph node measuring 13 mm (image 31 series 2. Celiac lymph node measures 16 mm on image 38, series 2 ER aortocaval node measures 14 mm on image 54. Reproductive: Post hysterectomy Other: No peritoneal nodularity.  No free fluid Musculoskeletal: Posterior lumbar fusion. No aggressive osseous lesion. IMPRESSION: 1. Dense masslike RIGHT infrahilar consolidation is concerning for lung carcinoma. Pneumonia could have a similar appearance. Recommend CT thorax with contrast and consider  bronchoscopy. 2. Diffuse thickening of the esophagus inferior to the carina. 3. Enlarged gastrohepatic ligament lymph nodes and periaortic adenopathy. 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 Electronically Signed   By: SSuzy BouchardM.D.   On: 08/21/2016 15:19   Dg Chest Portable 1 View  Addendum Date: 08/22/2016   ADDENDUM REPORT: 08/22/2016 09:53 ADDENDUM: This addendum is given for the purpose of noting that was thought to represent a chest tube on the prior examination is a monitor lead. The report is otherwise unchanged. Electronically Signed   By: TInge RiseM.D.   On: 08/22/2016 09:53   Result Date: 08/22/2016 CLINICAL DATA:  Status post right chest tube placement today. History of right lung adenocarcinoma. EXAM: PORTABLE CHEST 1 VIEW COMPARISON:  PA and lateral chest 07/22/2016 and CT chest 07/18/2016. FINDINGS: New right chest tube is in place. Right pleural effusion is somewhat decreased with some improvement in aeration in the right lung. Left lung remains clear. No pneumothorax. Cardiac silhouette is largely obscured. IMPRESSION: Decreased right pleural effusion with some improvement in aeration in the right chest after chest tube placement. Negative for pneumothorax. Electronically Signed: By: TInge RiseM.D. On: 08/21/2016 14:18   EGD 11/17 Impression:  - Congested mucosa in the esophagus like radiationeffect. No esophagitis,  ulceration or bleeding. - Normal stomach. No evidence of gastritis,ulceration or bleeding. - Normal examined duodenum. No duodenitis, ulceration or bleeding. - No specimens collected.  Subjective: Pt feels back to baseline. Breathing better. Having fun with her family in the room. No recurrent bleeding. Nausea and pain controlled without emesis. No chest pain.   Discharge Exam: Vitals:   08/24/16 2105 08/25/16 0604  BP: (!) 158/68 (!) 149/66  Pulse: (!) 109 97  Resp: 18 18  Temp: 97.5  F (36.4 C) 97.6 F (36.4 C)   Vitals:   08/24/16 1453 08/24/16 1853 08/24/16 2105 08/25/16 0604  BP: (!) 123/57  (!) 158/68 (!) 149/66  Pulse: (!) 115 (!) 111 (!) 109 97  Resp: '19  18 18  '$ Temp: 98.7 F (37.1 C)  97.5 F (36.4 C) 97.6 F (36.4 C)  TempSrc: Oral  Oral Oral  SpO2: 90%  90% 90%  Weight:      Height:       Constitutional:70 y.o.femalein no distress Respiratory:Nonlabored on 5L O2. Reduced bibasilar R > L breath sounds. Cardiovascular:Tachycardic rate, regular rhythm, no murmurs, rubs, or gallops. Nocarotid bruits. NoJVD. NoLE edema. 1+pedal pulses. Abdomen:Normoactive bowel sounds. Epigastric tenderness without distention, rebound or guarding. No hepatosplenomegaly. VO:HYWVPXTGGYIR catheter Psychiatric:Alert and oriented x3. Normal judgment and insight. Mood normalwith appropriateaffect.   The results of significant diagnostics from this hospitalization (including imaging, microbiology, ancillary and laboratory) are listed below for reference.    Microbiology: Recent Results (from the past 240 hour(s))  TECHNOLOGIST REVIEW     Status: None   Collection Time: 08/21/16 11:47 AM  Result Value Ref Range Status   Technologist Review occ Metas and Myelocytes present  Final  MRSA PCR Screening     Status: Abnormal   Collection Time: 08/21/16  5:43 PM  Result Value Ref Range Status   MRSA by PCR POSITIVE (A) NEGATIVE Final    Comment:        The GeneXpert MRSA Assay (FDA approved for NASAL specimens only), is one component of a comprehensive MRSA colonization surveillance program. It is not intended to diagnose MRSA infection nor to guide or monitor treatment for MRSA infections. RESULT CALLED TO, READ BACK BY AND VERIFIED WITH: K.BROOKS RN AT 1921 ON 08/21/16 BY S.VANHOORNE      Labs: BNP (last 3 results)  Recent Labs  11/09/15 1330  BNP 48.5   Basic Metabolic Panel:  Recent Labs Lab 08/21/16 1343 08/22/16 0611 08/23/16 0526  08/24/16 0328 08/25/16 0428  NA 139 142 140 141 141  K 3.4* 4.2 4.7 4.5 4.2  CL 100* 104 106 107 105  CO2 32 '31 29 29 31  '$ GLUCOSE 172* 116* 176* 210* 144*  BUN 22* '17 13 9 10  '$ CREATININE 0.84 0.82 0.75 0.75 0.82  CALCIUM 9.4 9.1 8.6* 8.8* 8.8*   Liver Function Tests:  Recent Labs Lab 08/21/16 1147 08/21/16 1343  AST 34 39  ALT 14 16  ALKPHOS 88 75  BILITOT 0.31 0.6  PROT 5.7* 6.0*  ALBUMIN 2.4* 3.0*   No results for input(s): LIPASE, AMYLASE in the last 168 hours. No results for input(s): AMMONIA in the last 168 hours. CBC:  Recent Labs Lab 08/21/16 1147 08/21/16 1343  08/22/16 0611  08/22/16 1834 08/22/16 2324 08/23/16 0527 08/24/16 0328 08/25/16 0428  WBC 3.9 3.8*  --  4.1  --   --   --  4.7 4.5 5.6  NEUTROABS 2.5  --   --   --   --   --   --   --   --   --  HGB 6.6* 6.8*  < > 9.5*  < > 9.4* 9.2* 9.6* 9.2* 8.9*  HCT 22.7* 23.1*  < > 29.9*  < > 29.8* 28.8* 31.1* 29.4* 29.2*  MCV 80.8 81.6  --  81.0  --   --   --  84.1 84.7 85.9  PLT 140* 159  --  136*  --   --   --  151 142* 139*  < > = values in this interval not displayed. Cardiac Enzymes: No results for input(s): CKTOTAL, CKMB, CKMBINDEX, TROPONINI in the last 168 hours. BNP: Invalid input(s): POCBNP CBG:  Recent Labs Lab 08/24/16 0754 08/24/16 1148 08/24/16 1622 08/24/16 2059 08/25/16 0802  GLUCAP 166* 160* 284* 216* 86   D-Dimer No results for input(s): DDIMER in the last 72 hours. Hgb A1c No results for input(s): HGBA1C in the last 72 hours. Lipid Profile No results for input(s): CHOL, HDL, LDLCALC, TRIG, CHOLHDL, LDLDIRECT in the last 72 hours. Thyroid function studies No results for input(s): TSH, T4TOTAL, T3FREE, THYROIDAB in the last 72 hours.  Invalid input(s): FREET3 Anemia work up No results for input(s): VITAMINB12, FOLATE, FERRITIN, TIBC, IRON, RETICCTPCT in the last 72 hours. Urinalysis    Component Value Date/Time   COLORURINE YELLOW 06/17/2016 1023   APPEARANCEUR CLEAR  06/17/2016 1023   LABSPEC 1.020 06/17/2016 1023   PHURINE 5.5 06/17/2016 1023   GLUCOSEU >1000 (A) 06/17/2016 1023   HGBUR NEGATIVE 06/17/2016 1023   BILIRUBINUR NEGATIVE 06/17/2016 1023   KETONESUR NEGATIVE 06/17/2016 1023   PROTEINUR NEGATIVE 06/17/2016 1023   UROBILINOGEN 0.2 06/07/2015 1122   NITRITE POSITIVE (A) 06/17/2016 1023   LEUKOCYTESUR NEGATIVE 06/17/2016 1023   Sepsis Labs Invalid input(s): PROCALCITONIN,  WBC,  LACTICIDVEN Microbiology Recent Results (from the past 240 hour(s))  TECHNOLOGIST REVIEW     Status: None   Collection Time: 08/21/16 11:47 AM  Result Value Ref Range Status   Technologist Review occ Metas and Myelocytes present  Final  MRSA PCR Screening     Status: Abnormal   Collection Time: 08/21/16  5:43 PM  Result Value Ref Range Status   MRSA by PCR POSITIVE (A) NEGATIVE Final    Comment:        The GeneXpert MRSA Assay (FDA approved for NASAL specimens only), is one component of a comprehensive MRSA colonization surveillance program. It is not intended to diagnose MRSA infection nor to guide or monitor treatment for MRSA infections. RESULT CALLED TO, READ BACK BY AND VERIFIED WITH: Crum RN AT [place] ON 08/21/16 BY S.VANHOORNE     Time coordinating discharge: Over 70 minutes  Vance Gather, MD  [facility] 08/25/2016, 11:46 AM Pager [phone]  If 7PM-7AM, please contact night-coverage [email or web address] Password [email or web address]

## 2016-08-25 NOTE — Progress Notes (Signed)
Inpatient Diabetes Program Recommendations  AACE/ADA: New Consensus Statement on Inpatient Glycemic Control (2015)  Target Ranges:  Prepandial:   less than 140 mg/dL      Peak postprandial:   less than 180 mg/dL (1-2 hours)      Critically ill patients:  140 - 180 mg/dL   Results for [name], [name] (MRN [id number]) as of 08/25/2016 09:06  Ref. Range 08/24/2016 07:54 08/24/2016 11:48 08/24/2016 16:22 08/24/2016 20:59  Glucose-Capillary Latest Ref Range: 65 - 99 mg/dL 166 (H) 160 (H) 284 (H) 216 (H)   Results for [name], [name] (MRN [id number]) as of 08/25/2016 09:06  Ref. Range 08/25/2016 08:02  Glucose-Capillary Latest Ref Range: 65 - 99 mg/dL 86    Admit with: Symptomatic Anemia  History: DM, Lung Cancer  Home DM Meds: Levemir 55 units AM/ 45 units PM       Novolog 15-25 units per SSI  Current Insulin Orders: Levemir 25 units BID      Novolog Moderate Correction Scale/ SSI (0-15 units) TID AC        -Note patient currently getting Prednisone 40 mg daily.  -Has elevated glucose levels in the afternoon likely due to the Prednisone.  -Diet advanced to Carbohydrate Modified diet yesterday.     MD- If patient continues to have late afternoon elevated postprandial glucose levels (and within goals of care), please consider starting Novolog Meal Coverage:  Novolog 3 units TIDWC (hold if pt eats <50% of meal)      --Will follow patient during hospitalization--  Wyn Quaker RN, MSN, CDE Diabetes Coordinator Inpatient Glycemic Control Team Team Pager: [phone] (8a-5p)

## 2016-08-29 ENCOUNTER — Telehealth: Payer: Self-pay | Admitting: Medical Oncology

## 2016-08-29 ENCOUNTER — Ambulatory Visit: Payer: Medicare Other | Admitting: Internal Medicine

## 2016-08-29 NOTE — Telephone Encounter (Signed)
I left message for dtr to call back re appt 11/30

## 2016-09-04 ENCOUNTER — Other Ambulatory Visit: Payer: Medicare Other

## 2016-09-04 ENCOUNTER — Ambulatory Visit: Payer: Medicare Other | Admitting: Internal Medicine

## 2016-09-04 ENCOUNTER — Ambulatory Visit: Payer: Medicare Other

## 2016-09-04 DIAGNOSIS — R404 Transient alteration of awareness: Secondary | ICD-10-CM | POA: Diagnosis not present

## 2016-09-04 DIAGNOSIS — R531 Weakness: Secondary | ICD-10-CM | POA: Diagnosis not present

## 2016-09-04 DIAGNOSIS — C210 Malignant neoplasm of anus, unspecified: Secondary | ICD-10-CM | POA: Diagnosis not present

## 2016-09-17 ENCOUNTER — Encounter: Payer: Self-pay | Admitting: Radiation Oncology

## 2016-09-18 ENCOUNTER — Ambulatory Visit: Payer: Medicare Other | Admitting: Internal Medicine

## 2016-09-18 ENCOUNTER — Other Ambulatory Visit: Payer: Medicare Other

## 2016-09-18 ENCOUNTER — Ambulatory Visit: Payer: Medicare Other

## 2016-09-19 ENCOUNTER — Encounter: Payer: Self-pay | Admitting: *Deleted

## 2016-09-19 NOTE — Progress Notes (Signed)
Spoke with [name]'s grandson he shared with me that she passed away earlier this month; I replied that I was very sorry to hear of his family's loss and I would make Dr. Tammi Klippel aware.

## 2016-09-23 ENCOUNTER — Ambulatory Visit: Admission: RE | Admit: 2016-09-23 | Payer: Medicare Other | Source: Ambulatory Visit | Admitting: Radiation Oncology

## 2016-10-02 ENCOUNTER — Ambulatory Visit: Payer: Medicare Other | Admitting: Internal Medicine

## 2016-10-02 ENCOUNTER — Other Ambulatory Visit: Payer: Medicare Other

## 2016-10-02 ENCOUNTER — Other Ambulatory Visit: Payer: Self-pay | Admitting: Nurse Practitioner

## 2016-10-06 DEATH — deceased

## 2016-11-10 ENCOUNTER — Ambulatory Visit: Payer: Medicare Other | Admitting: Pulmonary Disease

## 2017-01-12 ENCOUNTER — Other Ambulatory Visit: Payer: Medicare Other

## 2017-01-19 ENCOUNTER — Ambulatory Visit: Payer: Medicare Other | Admitting: Internal Medicine

## 2018-02-18 IMAGING — DX DG CHEST 2V
2 series · 2 of 2 positions shown · non-contrast
Comparison: 06/19/2016

CLINICAL DATA: Shortness of Breath

EXAM:
CHEST  2 VIEW

[chest pa]
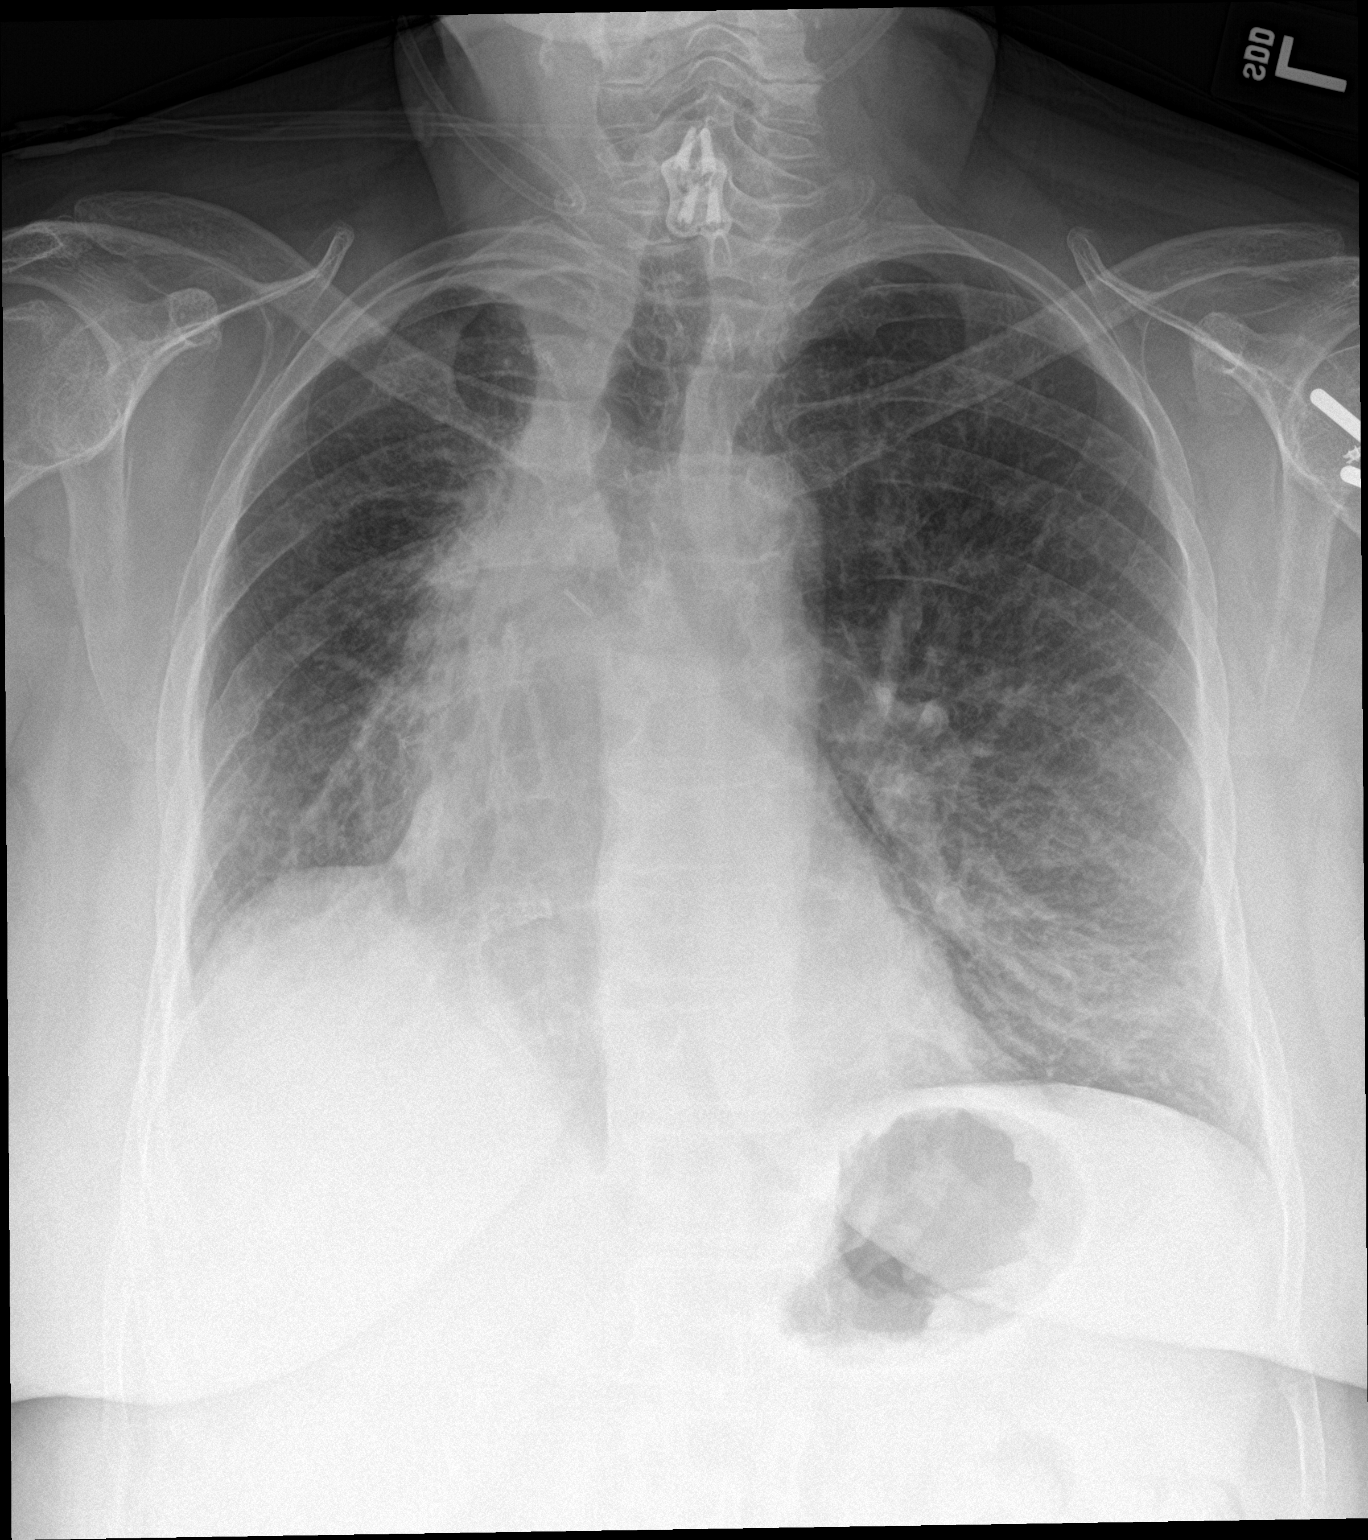

[chest lat]
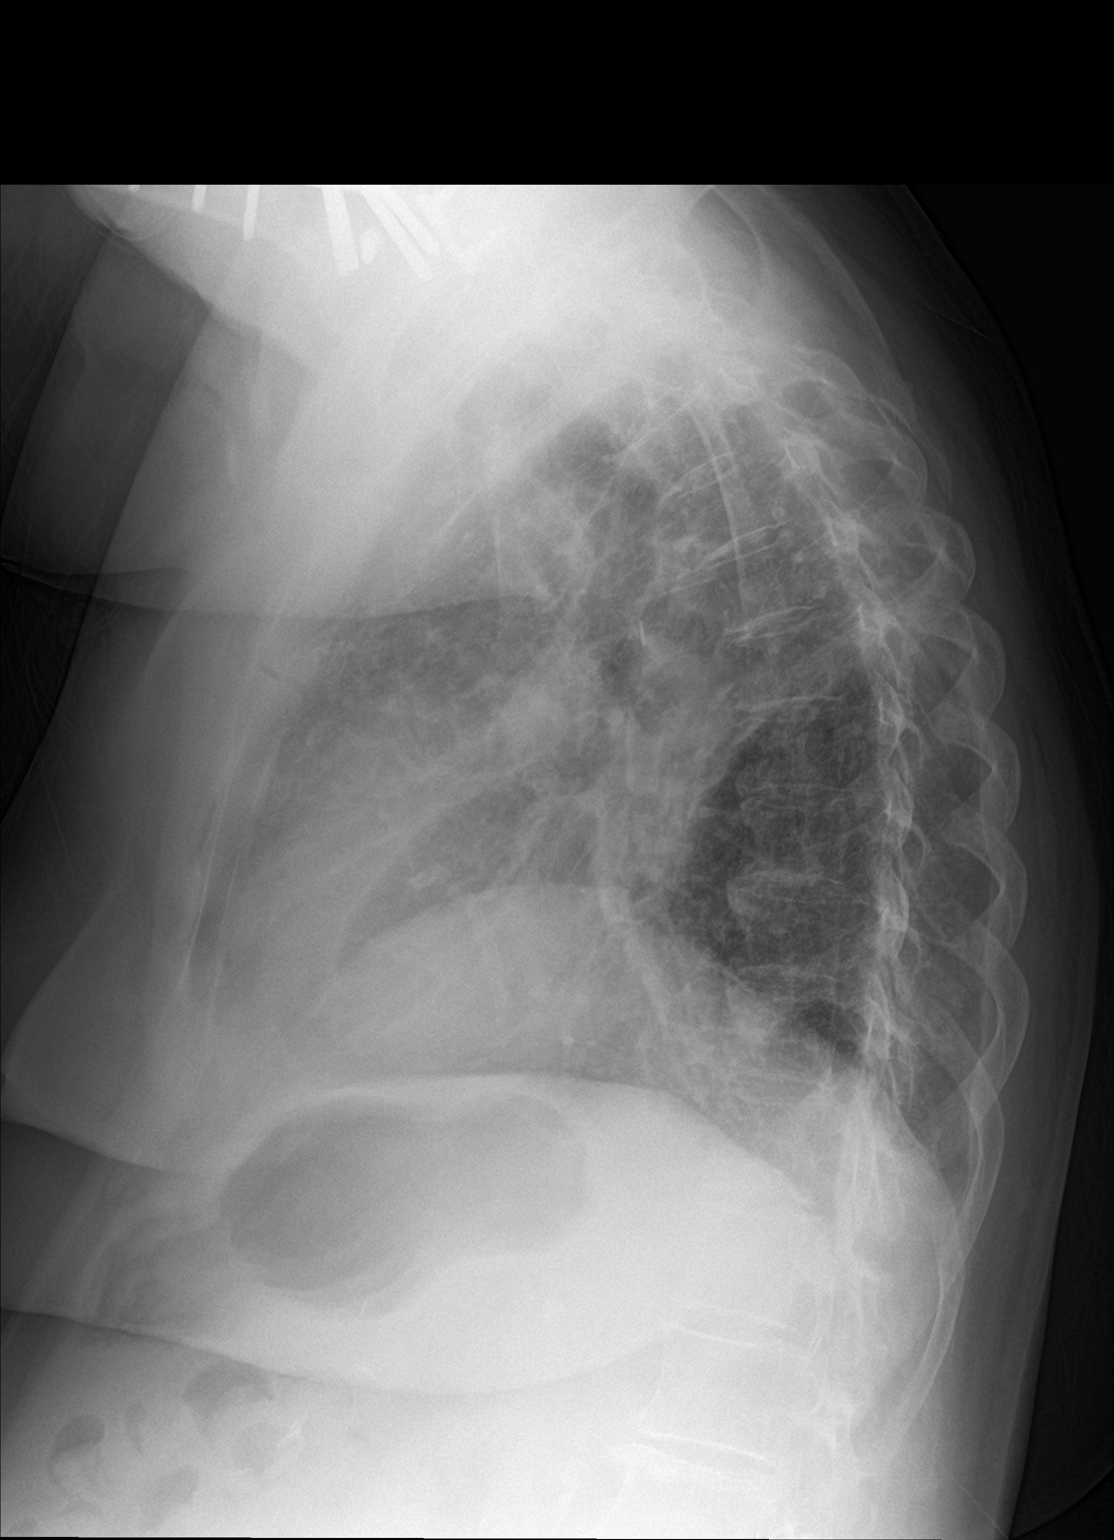

[2 of 2 positions shown; findings below may reference images not displayed]

FINDINGS: Cardiomegaly again noted. Stable postsurgical and postradiation
changes right lung post right upper lobectomy. No infiltrate or
pulmonary edema. Osteopenia and mild degenerative changes thoracic
spine.
IMPRESSION: No active disease. Stable postradiation changes and postoperative
changes right hemi thorax.
